# Patient Record
Sex: Male | Born: 1940
Health system: Southern US, Community
[De-identification: ages and names within clinical notes are randomized; demographics above are authoritative.]

## PROBLEM LIST (undated history)

## (undated) DIAGNOSIS — G4733 Obstructive sleep apnea (adult) (pediatric): Principal | ICD-10-CM

## (undated) DIAGNOSIS — G912 (Idiopathic) normal pressure hydrocephalus: Secondary | ICD-10-CM

## (undated) DIAGNOSIS — F32A Depression, unspecified: Secondary | ICD-10-CM

## (undated) DIAGNOSIS — I10 Essential (primary) hypertension: Secondary | ICD-10-CM

## (undated) HISTORY — PX: EYE SURGERY: SHX253

## (undated) HISTORY — DX: Obstructive sleep apnea (adult) (pediatric): G47.33

## (undated) HISTORY — PX: FRACTURE SURGERY: SHX138

---

## 2011-07-04 ENCOUNTER — Encounter (HOSPITAL_COMMUNITY): Payer: Self-pay | Admitting: *Deleted

## 2011-07-04 ENCOUNTER — Inpatient Hospital Stay (HOSPITAL_COMMUNITY)
Admission: AD | Admit: 2011-07-04 | Discharge: 2011-07-06 | DRG: 378 | Disposition: A | Discharge status: Home / Self Care | Payer: Medicare PPO | Source: Other Acute Inpatient Hospital | Attending: Internal Medicine | Admitting: Internal Medicine

## 2011-07-04 DIAGNOSIS — D62 Acute posthemorrhagic anemia: Secondary | ICD-10-CM | POA: Diagnosis present

## 2011-07-04 DIAGNOSIS — K922 Gastrointestinal hemorrhage, unspecified: Secondary | ICD-10-CM | POA: Diagnosis present

## 2011-07-04 DIAGNOSIS — K2980 Duodenitis without bleeding: Secondary | ICD-10-CM | POA: Diagnosis present

## 2011-07-04 DIAGNOSIS — E785 Hyperlipidemia, unspecified: Secondary | ICD-10-CM | POA: Diagnosis present

## 2011-07-04 DIAGNOSIS — Z7982 Long term (current) use of aspirin: Secondary | ICD-10-CM

## 2011-07-04 DIAGNOSIS — I10 Essential (primary) hypertension: Secondary | ICD-10-CM | POA: Diagnosis present

## 2011-07-04 DIAGNOSIS — K5520 Angiodysplasia of colon without hemorrhage: Secondary | ICD-10-CM | POA: Diagnosis present

## 2011-07-04 DIAGNOSIS — K222 Esophageal obstruction: Secondary | ICD-10-CM | POA: Diagnosis present

## 2011-07-04 DIAGNOSIS — Z791 Long term (current) use of non-steroidal anti-inflammatories (NSAID): Secondary | ICD-10-CM

## 2011-07-04 DIAGNOSIS — K2961 Other gastritis with bleeding: Principal | ICD-10-CM | POA: Diagnosis present

## 2011-07-04 DIAGNOSIS — E119 Type 2 diabetes mellitus without complications: Secondary | ICD-10-CM | POA: Diagnosis present

## 2011-07-04 DIAGNOSIS — K5730 Diverticulosis of large intestine without perforation or abscess without bleeding: Secondary | ICD-10-CM | POA: Diagnosis present

## 2011-07-04 HISTORY — DX: Essential (primary) hypertension: I10

## 2011-07-05 ENCOUNTER — Encounter (HOSPITAL_COMMUNITY): Payer: Self-pay | Admitting: *Deleted

## 2011-07-05 ENCOUNTER — Encounter (HOSPITAL_COMMUNITY)
Admission: AD | Disposition: A | Discharge status: Home / Self Care | Payer: Self-pay | Source: Other Acute Inpatient Hospital | Attending: Internal Medicine

## 2011-07-05 DIAGNOSIS — K922 Gastrointestinal hemorrhage, unspecified: Secondary | ICD-10-CM | POA: Diagnosis present

## 2011-07-05 DIAGNOSIS — E785 Hyperlipidemia, unspecified: Secondary | ICD-10-CM | POA: Diagnosis present

## 2011-07-05 DIAGNOSIS — I10 Essential (primary) hypertension: Secondary | ICD-10-CM | POA: Diagnosis present

## 2011-07-05 DIAGNOSIS — D62 Acute posthemorrhagic anemia: Secondary | ICD-10-CM | POA: Diagnosis present

## 2011-07-05 DIAGNOSIS — E119 Type 2 diabetes mellitus without complications: Secondary | ICD-10-CM | POA: Diagnosis present

## 2011-07-05 HISTORY — PX: ESOPHAGOGASTRODUODENOSCOPY: SHX5428

## 2011-07-05 LAB — COMPREHENSIVE METABOLIC PANEL
ALT: 17 U/L (ref 0–53)
Albumin: 3.3 g/dL — ABNORMAL LOW (ref 3.5–5.2)
Alkaline Phosphatase: 53 U/L (ref 39–117)
CO2: 30 mEq/L (ref 19–32)
Calcium: 8.9 mg/dL (ref 8.4–10.5)
Creatinine, Ser: 0.89 mg/dL (ref 0.50–1.35)
Total Bilirubin: 0.3 mg/dL (ref 0.3–1.2)

## 2011-07-05 LAB — TYPE AND SCREEN
ABO/RH(D): O POS
Antibody Screen: NEGATIVE

## 2011-07-05 LAB — GLUCOSE, CAPILLARY
Glucose-Capillary: 159 mg/dL — ABNORMAL HIGH (ref 70–99)
Glucose-Capillary: 141 mg/dL — ABNORMAL HIGH (ref 70–99)
Glucose-Capillary: 153 mg/dL — ABNORMAL HIGH (ref 70–99)

## 2011-07-05 LAB — CBC
HCT: 28.1 % — ABNORMAL LOW (ref 39.0–52.0)
Hemoglobin: 9.4 g/dL — ABNORMAL LOW (ref 13.0–17.0)
MCH: 32 pg (ref 26.0–34.0)
MCH: 31.7 pg (ref 26.0–34.0)
MCH: 32.1 pg (ref 26.0–34.0)
MCHC: 33.5 g/dL (ref 30.0–36.0)
MCHC: 33.6 g/dL (ref 30.0–36.0)
MCV: 95.4 fL (ref 78.0–100.0)
MCV: 95.5 fL (ref 78.0–100.0)
Platelets: 183 10*3/uL (ref 150–400)
Platelets: 207 10*3/uL (ref 150–400)
RBC: 2.81 MIL/uL — ABNORMAL LOW (ref 4.22–5.81)
RDW: 12.7 % (ref 11.5–15.5)

## 2011-07-05 SURGERY — EGD (ESOPHAGOGASTRODUODENOSCOPY)
Anesthesia: Moderate Sedation

## 2011-07-05 MED ORDER — PEG 3350-KCL-NA BICARB-NACL 420 G PO SOLR
4000.0000 mL | Freq: Once | ORAL | Status: AC
  Administered 2011-07-05: 4000 mL via ORAL
  Filled 2011-07-05: qty 4000

## 2011-07-05 MED ORDER — ONDANSETRON HCL 4 MG/2ML IJ SOLN: 4.0000 mg | Freq: Four times a day (QID) | INTRAMUSCULAR | Status: DC | PRN

## 2011-07-05 MED ORDER — ACETAMINOPHEN 325 MG PO TABS: 650.0000 mg | ORAL_TABLET | Freq: Four times a day (QID) | ORAL | Status: DC | PRN

## 2011-07-05 MED ORDER — FENTANYL CITRATE 0.05 MG/ML IJ SOLN
INTRAMUSCULAR | Status: DC | PRN
  Administered 2011-07-05 (×2): 25 ug via INTRAVENOUS

## 2011-07-05 MED ORDER — BISACODYL 5 MG PO TBEC
10.0000 mg | DELAYED_RELEASE_TABLET | Freq: Once | ORAL | Status: AC
  Administered 2011-07-05: 10 mg via ORAL
  Filled 2011-07-05: qty 2

## 2011-07-05 MED ORDER — FENTANYL CITRATE 0.05 MG/ML IJ SOLN
INTRAMUSCULAR | Status: AC
  Filled 2011-07-05: qty 2

## 2011-07-05 MED ORDER — MIDAZOLAM HCL 10 MG/2ML IJ SOLN
INTRAMUSCULAR | Status: DC | PRN
  Administered 2011-07-05 (×2): 2 mg via INTRAVENOUS

## 2011-07-05 MED ORDER — INSULIN ASPART 100 UNIT/ML ~~LOC~~ SOLN
0.0000 [IU] | SUBCUTANEOUS | Status: DC
  Administered 2011-07-05: 1 [IU] via SUBCUTANEOUS
  Administered 2011-07-06: 2 [IU] via SUBCUTANEOUS
  Filled 2011-07-05: qty 3

## 2011-07-05 MED ORDER — ENALAPRILAT 1.25 MG/ML IV SOLN
1.2500 mg | Freq: Four times a day (QID) | INTRAVENOUS | Status: DC
  Administered 2011-07-05 – 2011-07-06 (×3): 1.25 mg via INTRAVENOUS
  Filled 2011-07-05 (×10): qty 1

## 2011-07-05 MED ORDER — KCL IN DEXTROSE-NACL 20-5-0.9 MEQ/L-%-% IV SOLN
INTRAVENOUS | Status: DC
  Administered 2011-07-05: 01:00:00 via INTRAVENOUS
  Filled 2011-07-05 (×5): qty 1000

## 2011-07-05 MED ORDER — MIDAZOLAM HCL 10 MG/2ML IJ SOLN
INTRAMUSCULAR | Status: AC
  Filled 2011-07-05: qty 2

## 2011-07-05 MED ORDER — ONDANSETRON HCL 4 MG PO TABS: 4.0000 mg | ORAL_TABLET | Freq: Four times a day (QID) | ORAL | Status: DC | PRN

## 2011-07-05 MED ORDER — ACETAMINOPHEN 650 MG RE SUPP: 650.0000 mg | Freq: Four times a day (QID) | RECTAL | Status: DC | PRN

## 2011-07-05 MED ORDER — SODIUM CHLORIDE 0.9 % IV SOLN: Freq: Once | INTRAVENOUS | Status: DC

## 2011-07-05 MED ORDER — SODIUM CHLORIDE 0.9 % IV SOLN
8.0000 mg/h | INTRAVENOUS | Status: DC
  Administered 2011-07-05 (×2): 8 mg/h via INTRAVENOUS
  Filled 2011-07-05 (×6): qty 80

## 2011-07-05 MED ORDER — BUTAMBEN-TETRACAINE-BENZOCAINE 2-2-14 % EX AERO
INHALATION_SPRAY | CUTANEOUS | Status: DC | PRN
  Administered 2011-07-05: 2 via TOPICAL

## 2011-07-05 NOTE — H&P (Signed)
PCP:   No primary provider on file.   Chief Complaint:  Dizziness, black tarry stool for 3 days.  HPI: [name] is an extremely pleasant gentleman accepted in transfer form [place], where he presented with 3 day hx of passing tarry stool for the past 3 days, preceded by nausea and dizziness. No vomiting or hematemesis. He has had some mild abdominal discomfort. Patient admits to taking aspirin and ibuprofen on a daily basis, and eats lots of spicy food, which he thought protects against ulcer development. He had colonoscopy about 3 years ago(apparently normal per patient). No prior EGD. Patient last noticed tarry stool this morning and occasionally has some lightheadedness. He had some maroon colored stool about 3 months ago but ignored it, thinking the color change was related to what he had taken. Hemoglobin was 10 in [place] where he was started on protonix drip and ivf before transfer to [facility] for further management.  Review of Systems:  The patient denies anorexia, fever, weight loss,, vision loss, decreased hearing, hoarseness, chest pain, syncope, dyspnea on exertion, peripheral edema, balance deficits, hemoptysis, severe indigestion/heartburn, hematuria, incontinence, genital sores, muscle weakness, suspicious skin lesions, transient blindness, difficulty walking, depression, unusual weight change, abnormal bleeding, enlarged lymph nodes, angioedema, and breast masses.  Past Medical History: Past Medical History  Diagnosis Date  . Hypertension   . Diabetes mellitus    No past surgical history on file.  Medications: Prior to Admission medications   Medication Sig Start Date End Date Taking? Authorizing Provider  aspirin 325 MG tablet Take 325 mg by mouth daily.     Yes Historical Provider, MD  Enalapril-Hydrochlorothiazide 5-12.5 MG per tablet Take 1 tablet by mouth daily.     Yes Historical Provider, MD  metFORMIN (GLUCOPHAGE) 500 MG tablet Take 500 mg by mouth 2 (two) times  daily with a meal.     Yes Historical Provider, MD  pravastatin (PRAVACHOL) 40 MG tablet Take 40 mg by mouth daily.     Yes Historical Provider, MD    Allergies:  No Known Allergies  Social History:  reports that he quit smoking about 10 years ago. He quit smokeless tobacco use about 10 years ago. He reports that he drinks about 1.8 ounces of alcohol per week. He reports that he does not use illicit drugs.   Family History: No family history on file.  Physical Exam: Filed Vitals:   07/04/11 2207  BP: 151/73  Pulse: 90  Temp: 97.9 F (36.6 C)  TempSrc: Oral  Resp: 18  Height: 5\' 8"  (1.727 m)  Weight: 76.975 kg (169 lb 11.2 oz)  SpO2: 100%   General- not in distress. HEENT- no jvd. RS- lungs clear. CVS- S1S2. RRR Abdomen- soft, nontender. +BS CNS- grossly intact. Extremities- no pedal edema.   Labs on Admission:  No results found for this basename: NA:2,K:2,CL:2,CO2:2,GLUCOSE:2,BUN:2,CREATININE:2,CALCIUM:2,MG:2,PHOS:2 in the last 72 hours No results found for this basename: AST:2,ALT:2,ALKPHOS:2,BILITOT:2,PROT:2,ALBUMIN:2 in the last 72 hours No results found for this basename: LIPASE:2,AMYLASE:2 in the last 72 hours No results found for this basename: WBC:2,NEUTROABS:2,HGB:2,HCT:2,MCV:2,PLT:2 in the last 72 hours No results found for this basename: CKTOTAL:3,CKMB:3,CKMBINDEX:3,TROPONINI:3 in the last 72 hours No results found for this basename: TSH,T4TOTAL,FREET3,T3FREE,THYROIDAB in the last 72 hours No results found for this basename: VITAMINB12:2,FOLATE:2,FERRITIN:2,TIBC:2,IRON:2,RETICCTPCT:2 in the last 72 hours  Radiological Exams on Admission: No results found.  Assessment [name] presents with melena and acute blood loss anemia, likely due to NSAID induced bleeding peptic ulcer disease. He is hemodynamically stable  but has significant anemia which seems new.  Plan .GI bleeding- admit tele. PPI/IVF, monitor h/h, transfuse as necessary. Keep NPO. Avoid nsaids.  Discussed with Dr Dulce Sellar, who will graciously see patient in am. .Anemia associated with acute blood loss- monitor, transfuse prbc as necessary. .DM (diabetes mellitus), type 2- hold metformin. ssi .HTN (hypertension)- uncontrolled. Enalaprit while npo .Hyperlipidemia- resume home meds after egd. dvt prophylaxis. Condition guarded.    Randy [name] [phone] 07/05/2011, 1:01 AM

## 2011-07-05 NOTE — H&P (View-Only) (Signed)
DAILY PROGRESS NOTE                              GENERAL [facility]NAL [facility]IC[facility]  SUBJECTIVE: Had 1 dark bowel movement yesterday. Seems the blackish color is clearing and but did not resolve completely.  OBJECTIVE: BP 101/45  Pulse 81  Temp(Src) 96.7 F (35.9 C) (Oral)  Resp 20  Ht 5\' 8"  (1.727 m)  Wt 76.975 kg (169 lb 11.2 oz)  BMI 25.80 kg/m2  SpO2 97%  Intake/Output Summary (Last 24 hours) at 07/05/11 0942 Last data filed at 07/05/11 0700  Gross per 24 hour  Intake 646.67 ml  Output    950 ml  Net -303.33 ml                      Weight change:  Physical Exam: General: Alert and awake oriented x3 not in any acute distress. HEENT: anicteric sclera, pupils equal reactive to light and accommodation CVS: S1-S2 heard, no murmur rubs or gallops Chest: clear to auscultation bilaterally, no wheezing rales or rhonchi Abdomen:  normal bowel sounds, soft, nontender, nondistended, no organomegaly Neuro: Cranial nerves II-XII intact, no focal neurological deficits Extremities: no cyanosis, no clubbing or edema noted bilaterally   Lab Results:  [facility] 07/05/11 0056  NA 137  K 3.8  CL 101  CO2 30  GLUCOSE 230*  BUN 16  CREATININE 0.89  CALCIUM 8.9  MG --  PHOS --    Basename 07/05/11 0056  AST 12  ALT 17  ALKPHOS 53  BILITOT 0.3  PROT 5.9*  ALBUMIN 3.3*   Basename 07/05/11 0525 07/05/11 0056  WBC 6.1 8.3  NEUTROABS -- --  HGB 8.9* 9.4*  HCT 26.8* 28.1*  MCV 95.4 95.6  PLT 183 191    Studies/Results: No results found. Medications: Scheduled Meds:   . enalaprilat  1.25 mg Intravenous Q6H  . insulin aspart  0-9 Units Subcutaneous Q4H   Continuous Infusions:   . dextrose 5 % and 0.9 % NaCl with KCl 20 mEq/L 75 mL/hr at 07/05/11 0122  . pantoprozole (PROTONIX) infusion 8 mg/hr (07/05/11 0122)   PRN Meds:.acetaminophen, acetaminophen, ondansetron (ZOFRAN) IV, ondansetron  ASSESSMENT & PLAN: Active Problems:  GI bleeding  Anemia  associated with acute blood loss  DM (diabetes mellitus), type 2  HTN (hypertension)  Hyperlipidemia  1. GI bleed: Patient is on the PPI drip. Gastroenterology service was consulted. Patient is n.p.o. Reported use of NSAIDs increases possibility of gastritis/PUD. Await GI evaluation  2. Anemia, acute blood loss: Likely secondary to a GI bleed. Hemoglobin will be watched every 8 hours. We'll transfuse if hemoglobin dropped less than 7.0 or patient symptomatic.  3. Diabetes mellitus type 2: Patient is on sliding scale currently is n.p.o.  4. HTN: Off of the oral medication because of n.p.o. status on as needed IV enalaprilat.   LOS: 1 day   Nola Botkins A 07/05/2011, 9:42 AM

## 2011-07-05 NOTE — Plan of Care (Signed)
Problem: Phase II Progression Outcomes Goal: No active bleeding +

## 2011-07-05 NOTE — Interval H&P Note (Signed)
History and Physical Interval Note:  07/05/2011 1:46 PM  Randy [name]  has presented today for surgery, with the diagnosis of * No pre-op diagnosis entered *  The various methods of treatment have been discussed with the patient and family. After consideration of risks, benefits and other options for treatment, the patient has consented to  Procedure(s): ESOPHAGOGASTRODUODENOSCOPY (EGD) as a surgical intervention .  The patients' history has been reviewed, patient examined, no change in status, stable for surgery.  I have reviewed the patients' chart and labs.  Questions were answered to the patient's satisfaction.     Freddy Jaksch  Risks (bleeding, infection, bowel perforation that could require surgery, sedation-related changes in cardiopulmonary systems), benefits (identification and possible treatment of source of symptoms, exclusion of certain causes of symptoms), and alternatives (watchful waiting, radiographic imaging studies, empiric medical treatment) of upper endoscopy (EGD) were explained to patient/family in detail and patient wishes to proceed.

## 2011-07-05 NOTE — Consult Note (Signed)
[facility] Consultation Note  Referring Provider: Conley Canal, MD [facility])  Reason for Consultation:  Melena, anemia  HPI: Randy [name] is a 71 y.o. male admitted for melena.  In static state of health until few days ago, when he began to experience progressive weakness in setting of several melenic stools daily.  No abdominal pain.  ASA 81/day and occasional NSAIDs, but no increase lately.  No nausea, vomiting, dysphagia, odynophagia, change in bowel habits.  No hematemesis.  Never had endoscopy.  Screening colonoscopy ~ 3 years ago normal per patient.   Brother with history colon cancer.   Past Medical History  Diagnosis Date  . Hypertension   . Diabetes mellitus     No past surgical history on file.  Prior to Admission medications   Medication Sig Start Date End Date Taking? Authorizing Provider  aspirin 325 MG tablet Take 325 mg by mouth daily.     Yes Historical Provider, MD  Enalapril-Hydrochlorothiazide 5-12.5 MG per tablet Take 1 tablet by mouth daily.     Yes Historical Provider, MD  metFORMIN (GLUCOPHAGE) 500 MG tablet Take 500 mg by mouth 2 (two) times daily with a meal.     Yes Historical Provider, MD  pravastatin (PRAVACHOL) 40 MG tablet Take 40 mg by mouth daily.     Yes Historical Provider, MD    Current Facility-Administered Medications  Medication Dose Route Frequency Provider Last Rate Last Dose  . acetaminophen (TYLENOL) tablet 650 mg  650 mg Oral Q6H PRN Simbiso Ranga       Or  . acetaminophen (TYLENOL) suppository 650 mg  650 mg Rectal Q6H PRN Simbiso Ranga      . dextrose 5 % and 0.9 % NaCl with KCl 20 mEq/L infusion   Intravenous Continuous Simbiso Ranga 75 mL/hr at 07/05/11 0122    . enalaprilat (VASOTEC) injection 1.25 mg  1.25 mg Intravenous Q6H Simbiso Ranga   1.25 mg at 07/05/11 0621  . insulin aspart (novoLOG) injection 0-9 Units  0-9 Units Subcutaneous Q4H Simbiso Ranga      . ondansetron (ZOFRAN) tablet 4 mg  4 mg Oral Q6H PRN Simbiso Ranga        Or  . ondansetron (ZOFRAN) injection 4 mg  4 mg Intravenous Q6H PRN Simbiso Ranga      . pantoprazole (PROTONIX) 80 mg in sodium chloride 0.9 % 250 mL infusion  8 mg/hr Intravenous Continuous Simbiso Ranga 25 mL/hr at 07/05/11 0122 8 mg/hr at 07/05/11 0122    Allergies as of 07/04/2011  . (No Known Allergies)    No family history on file.  History   Social History  . Marital Status: Married    Spouse Name: N/A    Number of Children: N/A  . Years of Education: N/A   Occupational History  . Not on file.   Social History Main Topics  . Smoking status: Former Smoker    Quit date: 07/03/2001  . Smokeless tobacco: Former Neurosurgeon    Quit date: 07/03/2001  . Alcohol Use: 1.8 oz/week    3 Glasses of wine per week  . Drug Use: No  . Sexually Active: Yes   Other Topics Concern  . Not on file   Social History Narrative  . No narrative on file    Review of Systems: Positive for: anorexia, fatigue, weakness, malaise, dizziness Gen: Denies any fever, chills, rigors, night sweats, , involuntary weight loss, and sleep disorder CV: Denies chest pain, angina, palpitations, syncope, orthopnea, PND, peripheral edema,  and claudication. Resp: Denies dyspnea, cough, sputum, wheezing, coughing up blood. GI: Described in detail in HPI.    GU : Denies urinary burning, blood in urine, urinary frequency, urinary hesitancy, nocturnal urination, and urinary incontinence. MS: Denies joint pain or swelling.  Denies muscle weakness, cramps, atrophy.  Derm: Denies rash, itching, oral ulcerations, hives, unhealing ulcers.  Psych: Denies depression, anxiety, memory loss, suicidal ideation, hallucinations,  and confusion. Heme: Denies bruising, bleeding, and enlarged lymph nodes. Neuro:  Denies any headaches, , paresthesias. Endo:  Denies any problems with DM, thyroid, adrenal function.  Physical Exam: Vital signs in last 24 hours: Temp:  [96.7 F (35.9 C)-97.9 F (36.6 C)] 96.7 F (35.9 C)  (01/01 0636) Pulse Rate:  [81-90] 81  (01/01 0636) Resp:  [18-20] 20  (01/01 0636) BP: (101-151)/(45-73) 101/45 mmHg (01/01 0636) SpO2:  [97 %-100 %] 97 % (01/01 0636) Weight:  [76.975 kg (169 lb 11.2 oz)] 169 lb 11.2 oz (76.975 kg) (12/31 2207) Last BM Date: 07/04/11 General:   Alert,  Well-developed, well-nourished, pleasant and cooperative in NAD Head:  Normocephalic and atraumatic. Eyes:  Sclera clear, no icterus.   Conjunctiva pink. Ears:  Normal auditory acuity. Nose:  No deformity, discharge,  or lesions. Mouth:  No deformity or lesions.  Oropharynx pink & moist. Neck:  Supple; no masses or thyromegaly. Lungs:  Clear throughout to auscultation.   No wheezes, crackles, or rhonchi. No acute distress. Heart:  Regular rate and rhythm; no murmurs, clicks, rubs,  or gallops. Abdomen:  Soft, nontender and nondistended. No masses, hepatosplenomegaly or hernias noted. Slightly hyperactive bowel sounds, without guarding, and without rebound.     Msk:  Symmetrical without gross deformities. Normal posture. Pulses:  Normal pulses noted. Extremities:  Without clubbing or edema. Neurologic:  Alert and  oriented x4;  grossly normal neurologically. Skin:  Intact without significant lesions or rashes. Psych:  Alert and cooperative. Normal mood and affect.   Lab Results:  [facility] 07/05/11 0525 07/05/11 0056  WBC 6.1 8.3  HGB 8.9* 9.4*  HCT 26.8* 28.1*  PLT 183 191   BMET  Basename 07/05/11 0056  NA 137  K 3.8  CL 101  CO2 30  GLUCOSE 230*  BUN 16  CREATININE 0.89  CALCIUM 8.9   LFT  Basename 07/05/11 0056  PROT 5.9*  ALBUMIN 3.3*  AST 12  ALT 17  ALKPHOS 53  BILITOT 0.3  BILIDIR --  IBILI --   PT/INR  Basename 07/05/11 0056  LABPROT 13.3  INR 0.99    Studies/Results: No results found.  Impression:  1.  Melena. 2.  Acute blood loss anemia.  Overall history most typical of peptic ulcer disease.  Multiple other differential considerations can't be entirely  excluded.  Plan:  1.  Continue PPI. 2.  NPO. 3.  EGD today. 4.  Risks (bleeding, infection, bowel perforation that could require surgery, sedation-related changes in cardiopulmonary systems), benefits (identification and possible treatment of source of symptoms, exclusion of certain causes of symptoms), and alternatives (watchful waiting, radiographic imaging studies, empiric medical treatment) of upper endoscopy (EGD) were explained to patient/family in detail and patient wishes to proceed.   LOS: 1 day   Elita Dame M  07/05/2011, 10:23 AM

## 2011-07-05 NOTE — Progress Notes (Signed)
DAILY PROGRESS NOTE                              GENERAL INTERNAL MEDICINE [facility]  SUBJECTIVE: Had 1 dark bowel movement yesterday. Seems the blackish color is clearing and but did not resolve completely.  OBJECTIVE: BP 101/45  Pulse 81  Temp(Src) 96.7 F (35.9 C) (Oral)  Resp 20  Ht 5' 8" (1.727 m)  Wt 76.975 kg (169 lb 11.2 oz)  BMI 25.80 kg/m2  SpO2 97%  Intake/Output Summary (Last 24 hours) at 07/05/11 0942 Last data filed at 07/05/11 0700  Gross per 24 hour  Intake 646.67 ml  Output    950 ml  Net -303.33 ml                      Weight change:  Physical Exam: General: Alert and awake oriented x3 not in any acute distress. HEENT: anicteric sclera, pupils equal reactive to light and accommodation CVS: S1-S2 heard, no murmur rubs or gallops Chest: clear to auscultation bilaterally, no wheezing rales or rhonchi Abdomen:  normal bowel sounds, soft, nontender, nondistended, no organomegaly Neuro: Cranial nerves II-XII intact, no focal neurological deficits Extremities: no cyanosis, no clubbing or edema noted bilaterally   Lab Results:  Basename 07/05/11 0056  NA 137  K 3.8  CL 101  CO2 30  GLUCOSE 230*  BUN 16  CREATININE 0.89  CALCIUM 8.9  MG --  PHOS --    Basename 07/05/11 0056  AST 12  ALT 17  ALKPHOS 53  BILITOT 0.3  PROT 5.9*  ALBUMIN 3.3*   Basename 07/05/11 0525 07/05/11 0056  WBC 6.1 8.3  NEUTROABS -- --  HGB 8.9* 9.4*  HCT 26.8* 28.1*  MCV 95.4 95.6  PLT 183 191    Studies/Results: No results found. Medications: Scheduled Meds:   . enalaprilat  1.25 mg Intravenous Q6H  . insulin aspart  0-9 Units Subcutaneous Q4H   Continuous Infusions:   . dextrose 5 % and 0.9 % NaCl with KCl 20 mEq/L 75 mL/hr at 07/05/11 0122  . pantoprozole (PROTONIX) infusion 8 mg/hr (07/05/11 0122)   PRN Meds:.acetaminophen, acetaminophen, ondansetron (ZOFRAN) IV, ondansetron  ASSESSMENT & PLAN: Active Problems:  GI bleeding  Anemia  associated with acute blood loss  DM (diabetes mellitus), type 2  HTN (hypertension)  Hyperlipidemia  1. GI bleed: Patient is on the PPI drip. Gastroenterology service was consulted. Patient is n.p.o. Reported use of NSAIDs increases possibility of gastritis/PUD. Await GI evaluation  2. Anemia, acute blood loss: Likely secondary to a GI bleed. Hemoglobin will be watched every 8 hours. We'll transfuse if hemoglobin dropped less than 7.0 or patient symptomatic.  3. Diabetes mellitus type 2: Patient is on sliding scale currently is n.p.o.  4. HTN: Off of the oral medication because of n.p.o. status on as needed IV enalaprilat.   LOS: 1 day   Raven Furnas A 07/05/2011, 9:42 AM     

## 2011-07-05 NOTE — Op Note (Signed)
[name] [address] [place], [place]  [address]  ENDOSCOPY PROCEDURE REPORT  PATIENT:  Randy [name], Randy [name]  MR#:  [id number] BIRTHDATE:  12-24-1940, 70 yrs. old  GENDER:  male  ENDOSCOPIST:  Willis Modena, MD Referred by:   Clydia Llano, MD [facility])  PROCEDURE DATE:  07/05/2011 PROCEDURE:  EGD, diagnostic 65784 ASA CLASS:  Class II INDICATIONS:  melena, acute blood loss anemia MEDICATIONS:  Cetacaine spray x 2, Fentanyl 50 mcg IV, Versed 4 mg IV DESCRIPTION OF PROCEDURE:   After the risks benefits and alternatives of the procedure were thoroughly explained, informed consent was obtained.  The Pentax Gastroscope [id number] endoscope was introduced through the mouth and advanced to the second portion of the duodenum, without limitations.  The instrument was slowly withdrawn as the mucosa was fully examined.  <<PROCEDUREIMAGES>>  FINDINGS:  Small hiatal hernia.  Schatzki's ring versus peptic stricture, friable with some esophagitis, at GE junction. Otherwise normal esophagus.  Few pre-pyloric clean-based antral erosions, otherwise normal stomach.  Normal pylorus without stricture, ulcer, or outlet obstruction  Mild bulbar duodenitis with erosions.  At apex of duodenal bulb, there was some tissue prominence, most consistent with Brunner's hyperplasia; appearance not typical of mass lesion.  Duodenum otherwise normal to the third portion.  No old or fresh blood seen to the extent of our examination.  ENDOSCOPIC IMPRESSION:    1.  Peptic stricture with LA-B esophagitis. 2.  Gastric clean-based erosions. 3.  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 4.  No old/fresh blood seen.  While findings above could explain some melena, these very             well might be incidental findings (NSAID-related gastroduodenopathy).  RECOMMENDATIONS:      1.  Watch for potential complications of procedure. 2.  Continued PPI therapy. 3.   Colonoscopy tomorrow for further evaluation.  REPEAT EXAM: Colonoscopy tomorrow.  ______________________________ Willis Modena  CC:  n. eSIGNEDWillis Modena at 07/05/2011 02:16 PM  [place], [place], [phone]

## 2011-07-05 NOTE — Brief Op Note (Signed)
Please see [facility] note dated 07/05/11. 

## 2011-07-06 ENCOUNTER — Encounter (HOSPITAL_COMMUNITY)
Admission: AD | Disposition: A | Discharge status: Home / Self Care | Payer: Self-pay | Source: Other Acute Inpatient Hospital | Attending: Internal Medicine

## 2011-07-06 ENCOUNTER — Encounter (HOSPITAL_COMMUNITY): Payer: Self-pay | Admitting: *Deleted

## 2011-07-06 DIAGNOSIS — K922 Gastrointestinal hemorrhage, unspecified: Secondary | ICD-10-CM | POA: Diagnosis present

## 2011-07-06 HISTORY — PX: COLONOSCOPY: SHX5424

## 2011-07-06 LAB — CBC
Hemoglobin: 9.7 g/dL — ABNORMAL LOW (ref 13.0–17.0)
MCH: 31.8 pg (ref 26.0–34.0)
MCHC: 33.1 g/dL (ref 30.0–36.0)
MCHC: 33.5 g/dL (ref 30.0–36.0)
MCV: 96.1 fL (ref 78.0–100.0)
Platelets: 199 10*3/uL (ref 150–400)
Platelets: 258 10*3/uL (ref 150–400)
RBC: 3.05 MIL/uL — ABNORMAL LOW (ref 4.22–5.81)
RDW: 12.8 % (ref 11.5–15.5)
WBC: 6 10*3/uL (ref 4.0–10.5)

## 2011-07-06 LAB — GLUCOSE, CAPILLARY
Glucose-Capillary: 152 mg/dL — ABNORMAL HIGH (ref 70–99)
Glucose-Capillary: 164 mg/dL — ABNORMAL HIGH (ref 70–99)

## 2011-07-06 LAB — BASIC METABOLIC PANEL
CO2: 28 mEq/L (ref 19–32)
Chloride: 106 mEq/L (ref 96–112)
Creatinine, Ser: 0.82 mg/dL (ref 0.50–1.35)
GFR calc Af Amer: >90 mL/min (ref >90)
Sodium: 139 mEq/L (ref 135–145)

## 2011-07-06 LAB — ABO/RH: ABO/RH(D): O POS

## 2011-07-06 SURGERY — COLONOSCOPY
Anesthesia: Moderate Sedation

## 2011-07-06 SURGERY — EGD (ESOPHAGOGASTRODUODENOSCOPY)
Anesthesia: Moderate Sedation

## 2011-07-06 MED ORDER — FENTANYL CITRATE 0.05 MG/ML IJ SOLN
INTRAMUSCULAR | Status: AC
  Filled 2011-07-06: qty 4

## 2011-07-06 MED ORDER — SODIUM CHLORIDE 0.9 % IV SOLN: Freq: Once | INTRAVENOUS | Status: DC

## 2011-07-06 MED ORDER — ESOMEPRAZOLE MAGNESIUM 40 MG PO CPDR: 40.0000 mg | DELAYED_RELEASE_CAPSULE | Freq: Every day | ORAL | Status: DC

## 2011-07-06 MED ORDER — POLYETHYLENE GLYCOL 3350 17 G PO PACK: 17.0000 g | PACK | Freq: Every day | ORAL | Status: AC

## 2011-07-06 MED ORDER — MIDAZOLAM HCL 10 MG/2ML IJ SOLN
INTRAMUSCULAR | Status: AC
Start: 2011-07-06 — End: 2011-07-06
  Filled 2011-07-06: qty 4

## 2011-07-06 MED ORDER — MIDAZOLAM HCL 5 MG/5ML IJ SOLN
INTRAMUSCULAR | Status: DC | PRN
  Administered 2011-07-06 (×2): 2 mg via INTRAVENOUS

## 2011-07-06 MED ORDER — FENTANYL NICU IV SYRINGE 50 MCG/ML
INJECTION | INTRAMUSCULAR | Status: DC | PRN
  Administered 2011-07-06 (×2): 25 ug via INTRAVENOUS

## 2011-07-06 NOTE — H&P (Signed)
H&P reviewed

## 2011-07-06 NOTE — Progress Notes (Signed)
IV d/c'd. Tele d/c'd. Pt d/c'd to home. Home meds and d/c instructions discussed with pt. Pt denies any questions or concerns at this time. Pt leaving unit via wheelchair and appears in no acute distress. Jonalyn Sedlak RN 

## 2011-07-06 NOTE — Discharge Summary (Signed)
Patient ID: Randy [name] MRN: [id number] DOB/AGE: 1940-10-28 71 y.o.  Admit date: 07/04/2011 Discharge date: 07/06/2011  Primary Care Physician:  Randy Robes, MD  Discharge Diagnoses:    Present on Admission:  Marland KitchenMarland KitchenUpper GI bleed secondary to gastric erosions .Anemia associated with acute blood loss .DM (diabetes mellitus), type 2 .HTN (hypertension) .Hyperlipidemia    Current Discharge Medication List    START taking these medications   Details  esomeprazole (NEXIUM) 40 MG capsule Take 1 capsule (40 mg total) by mouth daily before breakfast. Qty: 30 capsule, Refills: 2      CONTINUE these medications which have NOT CHANGED   Details  Enalapril-Hydrochlorothiazide 5-12.5 MG per tablet Take 1 tablet by mouth daily.      metFORMIN (GLUCOPHAGE) 500 MG tablet Take 500 mg by mouth 2 (two) times daily with a meal.      pravastatin (PRAVACHOL) 40 MG tablet Take 40 mg by mouth daily.        STOP taking these medications     aspirin 325 MG tablet         Disposition and Follow-up:  Patient has  follow up appt scheduled wit his PCP tomorrow ( 01/03( at 11:15 am) Patient will follow up with Dr Randy [name] [facility]  GI) in 4 weeks  Consults:   [name] ( Randy [name]/ Randy [name])  Significant Diagnostic Studies:  EGD ( 07/05/11) FINDINGS: Small hiatal hernia. Schatzki's ring versus peptic  stricture, friable with some esophagitis, at GE junction.  Otherwise normal esophagus. Few pre-pyloric clean-based antral  erosions, otherwise normal stomach. Normal pylorus without  stricture, ulcer, or outlet obstruction Mild bulbar duodenitis  with erosions. At apex of duodenal bulb, there was some tissue  prominence, most consistent with Brunner's hyperplasia; appearance  not typical of mass lesion. Duodenum otherwise normal to the  third portion. No old or fresh blood seen to the extent of our  examination.  ENDOSCOPIC IMPRESSION: 1. Peptic stricture with LA-B  esophagitis.  2.  Gastric clean-based erosions.  3. 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  4. No old/fresh blood seen. While findings above could  explain some melena, these very well might be  incidental findings (NSAID-related gastroduodenopathy).   Colonoscopy ( 07/06/11) FINDINGS: Arteriovenous malformations were seen in the in the  cecum. very small could have been scope trauma Diverticula were  found. multiple small, medium, and large diverticula no active  bleeding Retroflexed views in the rectum revealed no  abnormalities.  COMPLICATIONS: A complication of none occured on 07/06/2011 at.  ENDOSCOPIC IMPRESSION:  1) AVM's in the cecum  2) Diverticula    Brief H and P: For complete details please refer to admission H and P, but in brief Randy [name] is an extremely pleasant gentleman accepted in transfer form [place], where he presented with 3 day hx of passing tarry stool for the past 3 days, preceded by nausea and dizziness. No vomiting or hematemesis. He has had some mild abdominal discomfort. Patient admits to taking aspirin and ibuprofen on a daily basis, and eats lots of spicy food, which he thought protects against ulcer development. He had colonoscopy about 3 years ago(apparently normal per patient). No prior EGD. Patient last noticed tarry stool this morning and occasionally has some lightheadedness. He had some maroon colored stool about 3 months ago but ignored it, thinking the color change was related to what he had taken. Hemoglobin was 10 in [place] where he was started on protonix drip and ivf before transfer to  [facility] for further management.   Physical Exam on Discharge:  Filed Vitals:   07/06/11 0920 07/06/11 0925 07/06/11 0930 07/06/11 1300  BP: 152/76 152/76 158/91 138/60  Pulse:      Temp:    98 F (36.7 C)  TempSrc:      Resp: 30 45 83 78  Height:      Weight:      SpO2: 98% 99% 100% 100%     Intake/Output Summary (Last 24 hours) at  07/06/11 1507 Last data filed at 07/06/11 1300  Gross per 24 hour  Intake 6058.75 ml  Output    501 ml  Net 5557.75 ml    General: elderly male in  no acute distress. HEENT: No bruits, no goiter. Heart: Regular rate and rhythm, without murmurs, rubs, gallops. Lungs: Clear to auscultation bilaterally. Abdomen: Soft, nontender, nondistended, positive bowel sounds. Extremities: No clubbing cyanosis or edema with positive pedal pulses. Neuro: Alert, awake, oriented x3,Grossly intact, nonfocal.  CBC:    Component Value Date/Time   WBC 10.7* 07/06/2011 1343   HGB 9.7* 07/06/2011 1343   HCT 29.3* 07/06/2011 1343   PLT 258 07/06/2011 1343   MCV 96.1 07/06/2011 1343    Basic Metabolic Panel:    Component Value Date/Time   NA 139 07/06/2011 1019   K 4.1 07/06/2011 1019   CL 106 07/06/2011 1019   CO2 28 07/06/2011 1019   BUN 6 07/06/2011 1019   CREATININE 0.82 07/06/2011 1019   GLUCOSE 158* 07/06/2011 1019   CALCIUM 8.7 07/06/2011 1019    Hospital Course:    *Upper GI bleed Patient admitted to medial floor on tele and continued on protonix drip that was started at [facility]  patient taken off ASA and NSAIDs that he was regularly taking at home . Serial H&h monitored and was noted to be stable. aptient seen by [name] and had EGD on 01/01 which showed  Peptic stricture with LA-B esophagitis,  Gastric clean-based erosions.  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  No old/fresh blood seen. Patient had colonoscopy done today which showed no active bleeding and findings of multiple  diverticulosis.  He has been clincially stable and asymptmatic. toleratred diet today. He is advised to discontinue ASA ( was taking 325 mg daily) for now and also avoid any NSAIDs and rather take extra strength  tylenol instead for his arthritis pains. His repeat hb today is  9.7 and stable. He will be discharged on po nexium 40 mg daily.he has scheuled for f/up appt with his PCP tomorrow.  Patient is planning on going on a cruise early next week and i have advised him to rechek his h&H and have his PCP notified at least once before leaving to make sure he is stable and follow up with GI in 4 weeks. He was found to have diverticulosis on colonoscopy and counseled on taking increased fluids, high fibre diet and also given a prescription for miralax.  His remaining medical issues have been stable.   Time spent on Discharge: 45 minutes  Signed: Eddie North 07/06/2011, 3:07 PM

## 2011-07-06 NOTE — Brief Op Note (Signed)
07/04/2011 - 07/06/2011  8:25 AM  PATIENT:  Randy [name]  71 y.o. male  PRE-OPERATIVE DIAGNOSIS:  GI Bleed  POST-OPERATIVE DIAGNOSIS:  Diverticulosis no bleeding  PROCEDURE:  Procedure(s): COLONOSCOPY  SURGEON:  Surgeon(s): Vertell Novak., MD

## 2011-07-06 NOTE — Op Note (Signed)
[name] [address] [place], [place]  [address]  COLONOSCOPY PROCEDURE REPORT  PATIENT:  Randy [name], Randy [name]  MR#:  [id number] BIRTHDATE:  02/28/1941, 70 yrs. old  GENDER:  male ENDOSCOPIST:  Carman Ching REF. BY:  [facility], PROCEDURE DATE:  07/06/2011 PROCEDURE:  Colonoscopy 19147 ASA CLASS:  Class II INDICATIONS:  Gastrointestinal hemorrhage EGD prior to this procedure yesterday revealed mild gastritis. Patient has had history of colonoscopies in the past done in [place] and in [place] over the past 10 years MEDICATIONS:   Fentanyl 50 mcg IV, Versed 4 mg IV  DESCRIPTION OF PROCEDURE:   After the risks benefits and alternatives of the procedure were thoroughly explained, informed consent was obtained.  Digital rectal exam was performed and revealed no abnormalities.   The Pentax Ped Colon [id number] and [id number]) endoscope was introduced through the anus and advanced to the cecum, limited by none.    The quality of the prep was good..  The instrument was then slowly withdrawn as the colon was fully examined. <<PROCEDUREIMAGES>>  FINDINGS:  Arteriovenous malformations were seen in the in the cecum. very small could have been scope trauma  Diverticula were found. multiple small, medium, and large diverticula no active bleeding   Retroflexed views in the rectum revealed no abnormalities. COMPLICATIONS:  A complication of none occured on 07/06/2011 at. ENDOSCOPIC IMPRESSION: 1) AVM's in the cecum 2) Diverticula probably scope trauma was very small and no active bleedingsuspect patient bled from diverticulosis RECOMMENDATIONS: we'll go ahead advance diet and discharge on MiraLAX in the future REPEAT EXAM:  No  ______________________________ Carman Ching  CC:  n. eSIGNEDCarman Ching at 07/06/2011 09:16 AM  Georgia Lopes, [phone]

## 2011-07-07 ENCOUNTER — Encounter (HOSPITAL_COMMUNITY): Payer: Self-pay | Admitting: Gastroenterology

## 2011-07-08 NOTE — Progress Notes (Signed)
Utilization review completed.  

## 2011-07-22 ENCOUNTER — Encounter (HOSPITAL_COMMUNITY): Payer: Self-pay | Admitting: Gastroenterology

## 2012-07-13 ENCOUNTER — Ambulatory Visit (INDEPENDENT_AMBULATORY_CARE_PROVIDER_SITE_OTHER): Payer: Medicare PPO | Admitting: Internal Medicine

## 2012-07-13 ENCOUNTER — Encounter: Payer: Self-pay | Admitting: Internal Medicine

## 2012-07-13 VITALS — BP 142/80 | HR 84 | Temp 98.5°F | Resp 18 | Ht 68.0 in | Wt 176.0 lb

## 2012-07-13 DIAGNOSIS — E785 Hyperlipidemia, unspecified: Secondary | ICD-10-CM

## 2012-07-13 DIAGNOSIS — E119 Type 2 diabetes mellitus without complications: Secondary | ICD-10-CM

## 2012-07-13 DIAGNOSIS — I10 Essential (primary) hypertension: Secondary | ICD-10-CM

## 2012-07-13 DIAGNOSIS — Z0000 Encounter for general adult medical examination without abnormal findings: Secondary | ICD-10-CM

## 2012-07-13 MED ORDER — METFORMIN HCL 1000 MG PO TABS: 1000.0000 mg | ORAL_TABLET | Freq: Two times a day (BID) | ORAL | Status: DC

## 2012-07-13 MED ORDER — METFORMIN HCL 500 MG PO TABS: 500.0000 mg | ORAL_TABLET | Freq: Two times a day (BID) | ORAL | Status: DC

## 2012-07-13 MED ORDER — PRAVASTATIN SODIUM 40 MG PO TABS: 40.0000 mg | ORAL_TABLET | Freq: Every day | ORAL | Status: DC

## 2012-07-13 MED ORDER — ENALAPRIL-HYDROCHLOROTHIAZIDE 10-25 MG PO TABS: 1.0000 | ORAL_TABLET | Freq: Every day | ORAL | Status: DC

## 2012-07-13 NOTE — Progress Notes (Signed)
Subjective:    Patient ID: Randy [name], male    DOB: 04-05-1941, 72 y.o.   MRN: [id number]  HPI  72 year old patient who is seen today to establish with our practice. Chronic medical problems include type 2 diabetes hypertension and dyslipidemia. He was hospitalized about one year ago for upper GI bleeding. He had upper and lower endoscopy at that time revealed some gastric erosions as well as AVMs and mild diverticular disease. No other hospital admissions  Past medical history otherwise unremarkable Social history he is retired from: [employer] 3 daughters and 5 grandchildren former smoker of 30 years duration but discontinued in Dec 02, 1983.  Family history father died at age 47 from cerebral vascular disease had diabetes and hypertension mother died age 5 of a brain tumor one brother in reasonably good health with ongoing tobacco use   1. Risk factors, based on past  M,S,F history-  cardiovascular risk factors include a history of diabetes hypertension and dyslipidemia  2.  Physical activities: Fairly active plays golf on a regular basis no exercise limitations  3.  Depression/mood: No history depression or mood disorder  4.  Hearing: No deficits  5.  ADL's: Independent in all aspects of daily living  6.  Fall risk: Low  7.  Home safety: No problems identified  8.  Height weight, and visual acuity; height and weight stable no change in visual acuity 9.  Counseling: Continue active lifestyle maintain ideal body weight home blood pressure and blood sugar monitoring  10. Lab orders based on risk factors: Hemoglobin A1c checked  11. Referral : Not appropriate at this time  12. Care plan: Continue heart healthy diet regular exercise regimen  13. Cognitive assessment: Alert and oriented normal affect. No cognitive dysfunction    Past Medical History  Diagnosis Date  . Hypertension   . Diabetes mellitus     History   Social History  . Marital Status: Married    Spouse Name: N/A     Number of Children: N/A  . Years of Education: N/A   Occupational History  . Not on file.   Social History Main Topics  . Smoking status: Former Smoker    Quit date: 07/03/2001  . Smokeless tobacco: Former Neurosurgeon    Quit date: 07/04/1991  . Alcohol Use: 1.8 oz/week    3 Glasses of wine per week     Comment: daily alcohol wine with dinner  . Drug Use: No  . Sexually Active: Yes   Other Topics Concern  . Not on file   Social History Narrative  . No narrative on file    Past Surgical History  Procedure Date  . Esophagogastroduodenoscopy 07/05/2011    Procedure: ESOPHAGOGASTRODUODENOSCOPY (EGD);  Surgeon: Freddy Jaksch, MD;  Location: [facility] END[facility]COPY;  Service: Endoscopy;  Laterality: N/A;  . Colonoscopy 07/06/2011    Procedure: COLONOSCOPY;  Surgeon: Vertell Novak., MD;  Location: [facility] ENDOSCOPY;  Service: Endoscopy;  Laterality: N/A;    No family history on file.  No Known Allergies  Current Outpatient Prescriptions on File Prior to Visit  Medication Sig Dispense Refill  . Enalapril-Hydrochlorothiazide 5-12.5 MG per tablet Take 1 tablet by mouth daily.        . metFORMIN (GLUCOPHAGE) 500 MG tablet Take 500 mg by mouth 2 (two) times daily with a meal.        . pravastatin (PRAVACHOL) 40 MG tablet Take 40 mg by mouth daily.          BP  142/80  Pulse 84  Temp 98.5 F (36.9 C) (Oral)  Resp 18  Ht 5\' 8"  (1.727 m)  Wt 176 lb (79.833 kg)  BMI 26.76 kg/m2  SpO2 97%       Review of Systems  Constitutional: Negative for fever, chills, activity change, appetite change and fatigue.  HENT: Negative for hearing loss, ear pain, congestion, rhinorrhea, sneezing, mouth sores, trouble swallowing, neck pain, neck stiffness, dental problem, voice change, sinus pressure and tinnitus.   Eyes: Negative for photophobia, pain, redness and visual disturbance.  Respiratory: Negative for apnea, cough, choking, chest tightness, shortness of breath and wheezing.   Cardiovascular:  Negative for chest pain, palpitations and leg swelling.  Gastrointestinal: Negative for nausea, vomiting, abdominal pain, diarrhea, constipation, blood in stool, abdominal distention, anal bleeding and rectal pain.  Genitourinary: Negative for dysuria, urgency, frequency, hematuria, flank pain, decreased urine volume, discharge, penile swelling, scrotal swelling, difficulty urinating, genital sores and testicular pain.  Musculoskeletal: Negative for myalgias, back pain, joint swelling, arthralgias and gait problem.  Skin: Negative for color change, rash and wound.  Neurological: Negative for dizziness, tremors, seizures, syncope, facial asymmetry, speech difficulty, weakness, light-headedness, numbness and headaches.  Hematological: Negative for adenopathy. Does not bruise/bleed easily.  Psychiatric/Behavioral: Negative for suicidal ideas, hallucinations, behavioral problems, confusion, sleep disturbance, self-injury, dysphoric mood, decreased concentration and agitation. The patient is not nervous/anxious.        Objective:   Physical Exam  Constitutional: He appears well-developed and well-nourished.       Blood pressure 140/80  HENT:  Head: Normocephalic and atraumatic.  Right Ear: External ear normal.  Left Ear: External ear normal.  Nose: Nose normal.  Mouth/Throat: Oropharynx is clear and moist.  Eyes: Conjunctivae normal and EOM are normal. Pupils are equal, round, and reactive to light. No scleral icterus.  Neck: Normal range of motion. Neck supple. No JVD present. No thyromegaly present.  Cardiovascular: Regular rhythm, normal heart sounds and intact distal pulses.  Exam reveals no gallop and no friction rub.   No murmur heard. Pulmonary/Chest: Effort normal and breath sounds normal. He exhibits no tenderness.  Abdominal: Soft. Bowel sounds are normal. He exhibits no distension and no mass. There is no tenderness.  Genitourinary: Prostate normal and penis normal. Guaiac negative  stool.  Musculoskeletal: Normal range of motion. He exhibits no edema and no tenderness.  Lymphadenopathy:    He has no cervical adenopathy.  Neurological: He is alert. He has normal reflexes. No cranial nerve deficit. Coordination normal.  Skin: Skin is warm and dry. No rash noted.  Psychiatric: He has a normal mood and affect. His behavior is normal.          Assessment & Plan:   Preventive health exam Hypertension. We'll up titrate blood pressure medication Dyslipidemia Diabetes mellitus. Will check a hemoglobin A1c  Recheck 3 months

## 2012-07-13 NOTE — Patient Instructions (Signed)
Limit your sodium (Salt) intake    It is important that you exercise regularly, at least 20 minutes 3 to 4 times per week.  If you develop chest pain or shortness of breath seek  medical attention.   Please check your hemoglobin A1c every 3 months   

## 2012-08-18 ENCOUNTER — Other Ambulatory Visit: Payer: Self-pay

## 2012-10-11 ENCOUNTER — Ambulatory Visit: Payer: Medicare PPO | Admitting: Internal Medicine

## 2012-10-16 ENCOUNTER — Ambulatory Visit (INDEPENDENT_AMBULATORY_CARE_PROVIDER_SITE_OTHER): Payer: Medicare PPO | Admitting: Internal Medicine

## 2012-10-16 ENCOUNTER — Encounter: Payer: Self-pay | Admitting: Internal Medicine

## 2012-10-16 VITALS — BP 140/70 | HR 75 | Temp 98.2°F | Resp 18 | Wt 178.0 lb

## 2012-10-16 DIAGNOSIS — E119 Type 2 diabetes mellitus without complications: Secondary | ICD-10-CM

## 2012-10-16 DIAGNOSIS — I10 Essential (primary) hypertension: Secondary | ICD-10-CM

## 2012-10-16 MED ORDER — ENALAPRIL-HYDROCHLOROTHIAZIDE 10-25 MG PO TABS: 1.0000 | ORAL_TABLET | Freq: Every day | ORAL | Status: DC

## 2012-10-16 MED ORDER — METFORMIN HCL 1000 MG PO TABS: 1000.0000 mg | ORAL_TABLET | Freq: Two times a day (BID) | ORAL | Status: DC

## 2012-10-16 MED ORDER — PRAVASTATIN SODIUM 40 MG PO TABS: 40.0000 mg | ORAL_TABLET | Freq: Every day | ORAL | Status: DC

## 2012-10-16 NOTE — Patient Instructions (Signed)
Limit your sodium (Salt) intake   Please check your hemoglobin A1c every 3 months    It is important that you exercise regularly, at least 20 minutes 3 to 4 times per week.  If you develop chest pain or shortness of breath seek  medical attention.   

## 2012-10-16 NOTE — Progress Notes (Signed)
Subjective:    Patient ID: Randy [name], male    DOB: [birth year], 72 y.o.   MRN: [id number]  HPI  Wt Readings from Last 3 Encounters:  10/16/12 178 lb (80.74 kg)  07/13/12 176 lb (79.833 kg)  07/06/11 169 lb 9.6 oz (76.51 kg)    72 year old patient who has type 2 diabetes. This has been controlled on metformin therapy. He is doing quite well. There is been some modest weight gain. He has dyslipidemia and hypertension. No concerns or complaints.  Past Medical History  Diagnosis Date  . Hypertension   . Diabetes mellitus     History   Social History  . Marital Status: Married    Spouse Name: N/A    Number of Children: N/A  . Years of Education: N/A   Occupational History  . Not on file.   Social History Main Topics  . Smoking status: Former Smoker    Quit date: 07/03/2001  . Smokeless tobacco: Former Neurosurgeon    Quit date: 07/04/1991  . Alcohol Use: 1.8 oz/week    3 Glasses of wine per week     Comment: daily alcohol wine with dinner  . Drug Use: No  . Sexually Active: Yes   Other Topics Concern  . Not on file   Social History Narrative  . No narrative on file    Past Surgical History  Procedure Laterality Date  . Esophagogastroduodenoscopy  07/05/2011    Procedure: ESOPHAGOGASTRODUODENOSCOPY (EGD);  Surgeon: Freddy Jaksch, MD;  Location: [facility] ENDOSCOPY;  Service: Endoscopy;  Laterality: N/A;  . Colonoscopy  07/06/2011    Procedure: COLONOSCOPY;  Surgeon: Vertell Novak., MD;  Location: [facility] ENDOSCOPY;  Service: Endoscopy;  Laterality: N/A;    No family history on file.  No Known Allergies  No current outpatient prescriptions on file prior to visit.   No current facility-administered medications on file prior to visit.    BP 140/70  Pulse 75  Temp(Src) 98.2 F (36.8 C) (Oral)  Resp 18  Wt 178 lb (80.74 kg)  BMI 27.07 kg/m2  SpO2 97%     Review of Systems  Constitutional: Negative for fever, chills, appetite change and fatigue.  HENT:  Negative for hearing loss, ear pain, congestion, sore throat, trouble swallowing, neck stiffness, dental problem, voice change and tinnitus.   Eyes: Negative for pain, discharge and visual disturbance.  Respiratory: Negative for cough, chest tightness, wheezing and stridor.   Cardiovascular: Negative for chest pain, palpitations and leg swelling.  Gastrointestinal: Negative for nausea, vomiting, abdominal pain, diarrhea, constipation, blood in stool and abdominal distention.  Genitourinary: Negative for urgency, hematuria, flank pain, discharge, difficulty urinating and genital sores.  Musculoskeletal: Negative for myalgias, back pain, joint swelling, arthralgias and gait problem.  Skin: Negative for rash.  Neurological: Negative for dizziness, syncope, speech difficulty, weakness, numbness and headaches.  Hematological: Negative for adenopathy. Does not bruise/bleed easily.  Psychiatric/Behavioral: Negative for behavioral problems and dysphoric mood. The patient is not nervous/anxious.        Objective:   Physical Exam  Constitutional: He is oriented to person, place, and time. He appears well-developed.  HENT:  Head: Normocephalic.  Right Ear: External ear normal.  Left Ear: External ear normal.  Eyes: Conjunctivae and EOM are normal.  Neck: Normal range of motion.  Cardiovascular: Normal rate and normal heart sounds.   Pulmonary/Chest: Breath sounds normal.  Abdominal: Bowel sounds are normal.  Musculoskeletal: Normal range of motion. He exhibits no edema and no tenderness.  Neurological: He is alert and oriented to person, place, and time.  Psychiatric: He has a normal mood and affect. His behavior is normal.          Assessment & Plan:  Diabetes mellitus. Will check a hemoglobin A1c weight loss encouraged Hypertension stable blood pressure 140/70 Dyslipidemia   Recheck 3 months

## 2012-10-17 ENCOUNTER — Encounter: Payer: Self-pay | Admitting: Internal Medicine

## 2012-10-17 ENCOUNTER — Other Ambulatory Visit: Payer: Self-pay | Admitting: *Deleted

## 2012-10-17 MED ORDER — GLUCOSE BLOOD VI STRP: 1.0000 | ORAL_STRIP | Freq: Every day | Status: DC | PRN

## 2012-10-17 NOTE — Telephone Encounter (Signed)
Pt notified Rx for Test strips was called into [facility].

## 2012-10-18 ENCOUNTER — Other Ambulatory Visit: Payer: Self-pay | Admitting: Internal Medicine

## 2012-10-18 MED ORDER — MELOXICAM 15 MG PO TABS: 15.0000 mg | ORAL_TABLET | Freq: Every day | ORAL | Status: DC

## 2013-01-16 ENCOUNTER — Encounter: Payer: Self-pay | Admitting: Internal Medicine

## 2013-02-06 ENCOUNTER — Other Ambulatory Visit: Payer: Self-pay

## 2013-04-01 ENCOUNTER — Telehealth: Payer: Self-pay | Admitting: Internal Medicine

## 2013-04-01 NOTE — Telephone Encounter (Signed)
Patient Information:  Caller Name: [place]  Phone: [phone]  Patient: Randy [name], Randy [name]  Gender: Male  DOB: 06/30/1941  Age: 72 Years  PCP: Eleonore Chiquito (Family Practice > 39yrs old)  Office Follow Up:  Does the office need to follow up with this patient?: Yes  Instructions For The Office: Discuss with PCP and call back today.  RN Note:  Intermittent episodes of 3-5 stools on 4 out of 7 days per week.  Concerned about [place] out [place] with moderate diarrhea.  Asking it Metformin may be cause.     Symptoms  Reason For Call & Symptoms: Worsening frequency of diarrhea since mid July.  Asking if could be related to Metformin.  FBS 140 at 0800.  Recent A1c was 6.2.  Going to [place] for 12 days on 04/24/13.  Reviewed Health History In EMR: No  Reviewed Medications In EMR: No  Reviewed Allergies In EMR: Yes  Reviewed Surgeries / Procedures: Yes  Date of Onset of Symptoms: 01/15/2013  Treatments Tried: Pepto Bismol, Kaopectate, switched to decaf coffee and tea, eliminated spicy foods.  Treatments Tried Worked: Yes  Guideline(s) Used:  Diarrhea  Disposition Per Guideline:   Callback by PCP Today  Reason For Disposition Reached:   Age > 70 years  Advice Given:  Call Back If:  Signs of dehydration occur (e.g., no urine for more than 12 hours, very dry mouth, lightheaded, etc.)  You become worse.  Patient Will Follow Care Advice:  YES

## 2013-04-11 ENCOUNTER — Ambulatory Visit (INDEPENDENT_AMBULATORY_CARE_PROVIDER_SITE_OTHER): Payer: Medicare PPO

## 2013-04-11 ENCOUNTER — Ambulatory Visit (INDEPENDENT_AMBULATORY_CARE_PROVIDER_SITE_OTHER): Payer: Medicare PPO | Admitting: *Deleted

## 2013-04-11 DIAGNOSIS — Z23 Encounter for immunization: Secondary | ICD-10-CM

## 2013-09-20 ENCOUNTER — Other Ambulatory Visit: Payer: Self-pay | Admitting: Internal Medicine

## 2013-09-23 ENCOUNTER — Other Ambulatory Visit: Payer: Self-pay | Admitting: Internal Medicine

## 2013-12-12 ENCOUNTER — Other Ambulatory Visit (INDEPENDENT_AMBULATORY_CARE_PROVIDER_SITE_OTHER): Payer: Medicare PPO

## 2013-12-12 DIAGNOSIS — I10 Essential (primary) hypertension: Secondary | ICD-10-CM

## 2013-12-12 DIAGNOSIS — E119 Type 2 diabetes mellitus without complications: Secondary | ICD-10-CM

## 2013-12-12 DIAGNOSIS — E785 Hyperlipidemia, unspecified: Secondary | ICD-10-CM

## 2013-12-12 DIAGNOSIS — Z125 Encounter for screening for malignant neoplasm of prostate: Secondary | ICD-10-CM

## 2013-12-12 DIAGNOSIS — Z0000 Encounter for general adult medical examination without abnormal findings: Secondary | ICD-10-CM

## 2013-12-12 LAB — POCT URINALYSIS DIPSTICK
Bilirubin, UA: NEGATIVE
Glucose, UA: NEGATIVE
KETONES UA: NEGATIVE
Nitrite, UA: POSITIVE
PH UA: 7
PROTEIN UA: NEGATIVE
RBC UA: NEGATIVE
SPEC GRAV UA: 1.02
Urobilinogen, UA: 0.2

## 2013-12-12 LAB — PSA: PSA: 2.63 ng/mL (ref 0.10–4.00)

## 2013-12-12 LAB — LIPID PANEL
CHOLESTEROL: 143 mg/dL (ref 0–200)
HDL: 66.2 mg/dL (ref >39.00)
LDL CALC: 62 mg/dL (ref 0–99)
NonHDL: 76.8
Total CHOL/HDL Ratio: 2
Triglycerides: 76 mg/dL (ref 0.0–149.0)
VLDL: 15.2 mg/dL (ref 0.0–40.0)

## 2013-12-12 LAB — MICROALBUMIN / CREATININE URINE RATIO
Creatinine,U: 122.7 mg/dL
Microalb Creat Ratio: 0.2 mg/g (ref 0.0–30.0)
Microalb, Ur: 0.2 mg/dL (ref 0.0–1.9)

## 2013-12-12 LAB — CBC WITH DIFFERENTIAL/PLATELET
BASOS ABS: 0 10*3/uL (ref 0.0–0.1)
BASOS PCT: 0.3 % (ref 0.0–3.0)
EOS ABS: 0.4 10*3/uL (ref 0.0–0.7)
Eosinophils Relative: 5.6 % — ABNORMAL HIGH (ref 0.0–5.0)
HCT: 42.9 % (ref 39.0–52.0)
Hemoglobin: 14.1 g/dL (ref 13.0–17.0)
LYMPHS PCT: 32 % (ref 12.0–46.0)
Lymphs Abs: 2.2 10*3/uL (ref 0.7–4.0)
MCHC: 32.9 g/dL (ref 30.0–36.0)
MCV: 97.7 fl (ref 78.0–100.0)
MONO ABS: 0.6 10*3/uL (ref 0.1–1.0)
Monocytes Relative: 8.6 % (ref 3.0–12.0)
NEUTROS PCT: 53.5 % (ref 43.0–77.0)
Neutro Abs: 3.7 10*3/uL (ref 1.4–7.7)
PLATELETS: 231 10*3/uL (ref 150.0–400.0)
RBC: 4.39 Mil/uL (ref 4.22–5.81)
RDW: 13.4 % (ref 11.5–15.5)
WBC: 6.9 10*3/uL (ref 4.0–10.5)

## 2013-12-12 LAB — BASIC METABOLIC PANEL
BUN: 19 mg/dL (ref 6–23)
CALCIUM: 9.4 mg/dL (ref 8.4–10.5)
CHLORIDE: 100 meq/L (ref 96–112)
CO2: 31 meq/L (ref 19–32)
CREATININE: 0.9 mg/dL (ref 0.4–1.5)
GFR: 93.84 mL/min (ref >60.00)
Glucose, Bld: 111 mg/dL — ABNORMAL HIGH (ref 70–99)
Potassium: 4.9 mEq/L (ref 3.5–5.1)
Sodium: 139 mEq/L (ref 135–145)

## 2013-12-12 LAB — HEPATIC FUNCTION PANEL
ALK PHOS: 38 U/L — AB (ref 39–117)
ALT: 17 U/L (ref 0–53)
AST: 16 U/L (ref 0–37)
Albumin: 3.8 g/dL (ref 3.5–5.2)
BILIRUBIN DIRECT: 0.1 mg/dL (ref 0.0–0.3)
BILIRUBIN TOTAL: 0.5 mg/dL (ref 0.2–1.2)
TOTAL PROTEIN: 6 g/dL (ref 6.0–8.3)

## 2013-12-12 LAB — HEMOGLOBIN A1C: Hgb A1c MFr Bld: 6 % (ref 4.6–6.5)

## 2013-12-12 LAB — TSH: TSH: 2.09 u[IU]/mL (ref 0.35–4.50)

## 2013-12-19 ENCOUNTER — Encounter: Payer: Self-pay | Admitting: Internal Medicine

## 2013-12-19 ENCOUNTER — Ambulatory Visit (INDEPENDENT_AMBULATORY_CARE_PROVIDER_SITE_OTHER): Payer: Medicare PPO | Admitting: Internal Medicine

## 2013-12-19 VITALS — BP 120/72 | HR 76 | Temp 97.5°F | Resp 20 | Ht 68.0 in | Wt 158.0 lb

## 2013-12-19 DIAGNOSIS — I10 Essential (primary) hypertension: Secondary | ICD-10-CM

## 2013-12-19 DIAGNOSIS — E119 Type 2 diabetes mellitus without complications: Secondary | ICD-10-CM

## 2013-12-19 DIAGNOSIS — Z0000 Encounter for general adult medical examination without abnormal findings: Secondary | ICD-10-CM

## 2013-12-19 DIAGNOSIS — Z23 Encounter for immunization: Secondary | ICD-10-CM

## 2013-12-19 DIAGNOSIS — E785 Hyperlipidemia, unspecified: Secondary | ICD-10-CM

## 2013-12-19 MED ORDER — ENALAPRIL-HYDROCHLOROTHIAZIDE 10-25 MG PO TABS: ORAL_TABLET | ORAL | Status: DC

## 2013-12-19 MED ORDER — PRAVASTATIN SODIUM 40 MG PO TABS: ORAL_TABLET | ORAL | Status: DC

## 2013-12-19 MED ORDER — METFORMIN HCL 1000 MG PO TABS: 1000.0000 mg | ORAL_TABLET | Freq: Every day | ORAL | Status: DC

## 2013-12-19 NOTE — Patient Instructions (Signed)
Limit your sodium (Salt) intake  Please check your blood pressure on a regular basis.  If it is consistently greater than 150/90, please make an office appointment.     It is important that you exercise regularly, at least 20 minutes 3 to 4 times per week.  If you develop chest pain or shortness of breath seek  medical attention. 

## 2013-12-19 NOTE — Progress Notes (Signed)
Subjective:    Patient ID: Randy [name], male    DOB: 1941-04-07, 73 y.o.   MRN: [id number]  HPI 73 -year-old patient who is seen today for a preventive health exam.  Chronic medical problems include type 2 diabetes hypertension and dyslipidemia. He was hospitalized about 3years ago for upper GI bleeding. He had upper and lower endoscopy at that time revealed some gastric erosions as well as AVMs and mild diverticular disease. No other hospital admissions  Past medical history otherwise unremarkable Social history he is retired from [employer] ; 3 daughters and 5 grandchildren former smoker of 30 years duration but discontinued in 10-21-1983.  Family history father died at age 63 from cerebral vascular disease had diabetes and hypertension mother died age 67 of a brain tumor one brother in reasonably good health with ongoing tobacco use  Medicare wellness  1. Risk factors, based on past  M,S,F history-  cardiovascular risk factors include a history of diabetes hypertension and dyslipidemia  2.  Physical activities: Fairly active plays golf on a regular basis no exercise limitations  3.  Depression/mood: No history depression or mood disorder  4.  Hearing: No deficits  5.  ADL's: Independent in all aspects of daily living  6.  Fall risk: Low  7.  Home safety: No problems identified  8.  Height weight, and visual acuity; height and weight stable no change in visual acuity 9.  Counseling: Continue active lifestyle maintain ideal body weight home blood pressure and blood sugar monitoring  10. Lab orders based on risk factors: Hemoglobin A1c checked  11. Referral : Not appropriate at this time  12. Care plan: Continue heart healthy diet regular exercise regimen  13. Cognitive assessment: Alert and oriented normal affect. No cognitive dysfunction    Past Medical History  Diagnosis Date  . Hypertension   . Diabetes mellitus     History   Social History  . Marital Status:  Married    Spouse Name: N/A    Number of Children: N/A  . Years of Education: N/A   Occupational History  . Not on file.   Social History Main Topics  . Smoking status: Former Smoker    Quit date: 07/03/2001  . Smokeless tobacco: Former Systems developer    Quit date: 07/04/1991  . Alcohol Use: 1.8 oz/week    3 Glasses of wine per week     Comment: daily alcohol wine with dinner  . Drug Use: No  . Sexual Activity: Yes   Other Topics Concern  . Not on file   Social History Narrative  . No narrative on file    Past Surgical History  Procedure Laterality Date  . Esophagogastroduodenoscopy  07/05/2011    Procedure: ESOPHAGOGASTRODUODENOSCOPY (EGD);  Surgeon: Landry Dyke, MD;  Location: [facility] ENDOSCOPY;  Service: Endoscopy;  Laterality: N/A;  . Colonoscopy  07/06/2011    Procedure: COLONOSCOPY;  Surgeon: Winfield Cunas., MD;  Location: [facility] ENDOSCOPY;  Service: Endoscopy;  Laterality: N/A;    No family history on file.  No Known Allergies  Current Outpatient Prescriptions on File Prior to Visit  Medication Sig Dispense Refill  . enalapril-hydrochlorothiazide (VASERETIC) 10-25 MG per tablet TAKE 1 TABLET BY MOUTH DAILY.  90 tablet  0  . glucose blood (TRUETEST TEST) test strip 1 each by Other route daily as needed for other. Use as instructed  100 each  3  . metFORMIN (GLUCOPHAGE) 1000 MG tablet TAKE 1 TABLET BY MOUTH TWICE DAILY BEFORE  MEALS  180 tablet  0  . pravastatin (PRAVACHOL) 40 MG tablet TAKE 1 TABLET BY MOUTH EVERY DAY  90 tablet  3   No current facility-administered medications on file prior to visit.    BP 120/72  Pulse 76  Temp(Src) 97.5 F (36.4 C) (Oral)  Resp 20  Ht 5\' 8"  (1.727 m)  Wt 158 lb (71.668 kg)  BMI 24.03 kg/m2  SpO2 98%       Review of Systems  Constitutional: Negative for fever, chills, activity change, appetite change and fatigue.  HENT: Negative for congestion, dental problem, ear pain, hearing loss, mouth sores, rhinorrhea, sinus pressure,  sneezing, tinnitus, trouble swallowing and voice change.   Eyes: Negative for photophobia, pain, redness and visual disturbance.  Respiratory: Negative for apnea, cough, choking, chest tightness, shortness of breath and wheezing.   Cardiovascular: Negative for chest pain, palpitations and leg swelling.  Gastrointestinal: Negative for nausea, vomiting, abdominal pain, diarrhea, constipation, blood in stool, abdominal distention, anal bleeding and rectal pain.  Genitourinary: Negative for dysuria, urgency, frequency, hematuria, flank pain, decreased urine volume, discharge, penile swelling, scrotal swelling, difficulty urinating, genital sores and testicular pain.  Musculoskeletal: Negative for arthralgias, back pain, gait problem, joint swelling, myalgias, neck pain and neck stiffness.  Skin: Negative for color change, rash and wound.  Neurological: Negative for dizziness, tremors, seizures, syncope, facial asymmetry, speech difficulty, weakness, light-headedness, numbness and headaches.  Hematological: Negative for adenopathy. Does not bruise/bleed easily.  Psychiatric/Behavioral: Negative for suicidal ideas, hallucinations, behavioral problems, confusion, sleep disturbance, self-injury, dysphoric mood, decreased concentration and agitation. The patient is not nervous/anxious.        Objective:   Physical Exam  Constitutional: He appears well-developed and well-nourished.  Blood pressure 140/80  HENT:  Head: Normocephalic and atraumatic.  Right Ear: External ear normal.  Left Ear: External ear normal.  Nose: Nose normal.  Mouth/Throat: Oropharynx is clear and moist.  Eyes: Conjunctivae and EOM are normal. Pupils are equal, round, and reactive to light. No scleral icterus.  Neck: Normal range of motion. Neck supple. No JVD present. No thyromegaly present.  Cardiovascular: Regular rhythm, normal heart sounds and intact distal pulses.  Exam reveals no gallop and no friction rub.   No murmur  heard. Pulmonary/Chest: Effort normal and breath sounds normal. He exhibits no tenderness.  Abdominal: Soft. Bowel sounds are normal. He exhibits no distension and no mass. There is no tenderness.  Genitourinary: Prostate normal and penis normal. Guaiac negative stool.  Musculoskeletal: Normal range of motion. He exhibits no edema and no tenderness.  Lymphadenopathy:    He has no cervical adenopathy.  Neurological: He is alert. He has normal reflexes. No cranial nerve deficit. Coordination normal.  Skin: Skin is warm and dry. No rash noted.  Psychiatric: He has a normal mood and affect. His behavior is normal.          Assessment & Plan:   Preventive health exam Hypertension.  Well controlled Dyslipidemia .  Continue statin therapy Diabetes mellitus.  Well controlled.  Hemoglobin A1c 6 point 0   Recheck 3 months

## 2013-12-19 NOTE — Progress Notes (Signed)
   Subjective:    Patient ID: Randy [name], male    DOB: August 19, 1940, 73 y.o.   MRN: [id number]  HPI  Wt Readings from Last 3 Encounters:  12/19/13 158 lb (71.668 kg)  10/16/12 178 lb (80.74 kg)  07/13/12 176 lb (79.833 kg)    Review of Systems     Objective:   Physical Exam        Assessment & Plan:

## 2013-12-20 ENCOUNTER — Telehealth: Payer: Self-pay | Admitting: Internal Medicine

## 2013-12-20 NOTE — Telephone Encounter (Signed)
Relevant patient education assigned to patient using Emmi. ° °

## 2014-01-16 ENCOUNTER — Other Ambulatory Visit: Payer: Self-pay | Admitting: Internal Medicine

## 2014-05-01 ENCOUNTER — Ambulatory Visit (INDEPENDENT_AMBULATORY_CARE_PROVIDER_SITE_OTHER): Payer: Medicare PPO

## 2014-05-01 DIAGNOSIS — Z23 Encounter for immunization: Secondary | ICD-10-CM

## 2014-09-05 ENCOUNTER — Encounter: Payer: Self-pay | Admitting: Internal Medicine

## 2014-09-05 NOTE — Telephone Encounter (Signed)
Pt would like to know if you will switch him to Simvastatin instead of Pravastatin  due to cost?

## 2014-09-08 MED ORDER — SIMVASTATIN 20 MG PO TABS: 20.0000 mg | ORAL_TABLET | Freq: Every day | ORAL | Status: DC

## 2014-09-27 ENCOUNTER — Other Ambulatory Visit: Payer: Self-pay | Admitting: Internal Medicine

## 2014-10-01 ENCOUNTER — Encounter: Payer: Self-pay | Admitting: Internal Medicine

## 2014-10-01 MED ORDER — ENALAPRIL-HYDROCHLOROTHIAZIDE 10-25 MG PO TABS: 1.0000 | ORAL_TABLET | Freq: Every day | ORAL | Status: DC

## 2015-02-27 ENCOUNTER — Telehealth: Payer: Self-pay | Admitting: *Deleted

## 2015-02-27 MED ORDER — METFORMIN HCL 1000 MG PO TABS: 1000.0000 mg | ORAL_TABLET | Freq: Every day | ORAL | Status: DC

## 2015-02-27 NOTE — Telephone Encounter (Signed)
Patient requesting refill on Metformin last office visit in 2015 also needs dose clarification for refill.

## 2015-02-27 NOTE — Telephone Encounter (Signed)
Spoke to pt, told him he is overdue for a physical and will give you a 3 month supply. Also I have that you are taking 1000 mg twice a day. Pt said the dosage was decreased when he was in last due to weight loss and now only taking one daily. Told pt okay I will change to once daily but need to schedule appt. Pt verbalized understanding and will schedule physical. Rx sent to pharmacy.

## 2015-03-23 ENCOUNTER — Encounter: Payer: Self-pay | Admitting: Internal Medicine

## 2015-03-23 MED ORDER — ENALAPRIL-HYDROCHLOROTHIAZIDE 10-25 MG PO TABS: 1.0000 | ORAL_TABLET | Freq: Every day | ORAL | Status: DC

## 2015-03-25 ENCOUNTER — Other Ambulatory Visit: Payer: Self-pay | Admitting: Internal Medicine

## 2015-04-20 ENCOUNTER — Encounter: Payer: Self-pay | Admitting: Internal Medicine

## 2015-04-20 ENCOUNTER — Other Ambulatory Visit: Payer: Self-pay

## 2015-04-22 MED ORDER — TRUE METRIX LEVEL 1 LOW VI SOLN: Status: DC

## 2015-04-22 MED ORDER — GLUCOSE BLOOD VI STRP: ORAL_STRIP | Status: DC

## 2015-04-22 MED ORDER — TRUEPLUS LANCETS 33G MISC: Status: DC

## 2015-04-22 MED ORDER — TRUE METRIX AIR GLUCOSE METER W/DEVICE KIT: 1.0000 | PACK | Freq: Every day | Status: DC

## 2015-04-27 ENCOUNTER — Encounter: Payer: Self-pay | Admitting: Internal Medicine

## 2015-05-01 ENCOUNTER — Other Ambulatory Visit (INDEPENDENT_AMBULATORY_CARE_PROVIDER_SITE_OTHER): Payer: Medicare PPO

## 2015-05-01 DIAGNOSIS — E785 Hyperlipidemia, unspecified: Secondary | ICD-10-CM

## 2015-05-01 DIAGNOSIS — Z0000 Encounter for general adult medical examination without abnormal findings: Secondary | ICD-10-CM | POA: Diagnosis not present

## 2015-05-01 DIAGNOSIS — Z125 Encounter for screening for malignant neoplasm of prostate: Secondary | ICD-10-CM | POA: Diagnosis not present

## 2015-05-01 LAB — MICROALBUMIN / CREATININE URINE RATIO
Creatinine,U: 108.9 mg/dL
Microalb Creat Ratio: 0.6 mg/g (ref 0.0–30.0)
Microalb, Ur: <0.7 mg/dL (ref 0.0–1.9)

## 2015-05-01 LAB — CBC WITH DIFFERENTIAL/PLATELET
BASOS ABS: 0 10*3/uL (ref 0.0–0.1)
Basophils Relative: 0.3 % (ref 0.0–3.0)
EOS ABS: 0.4 10*3/uL (ref 0.0–0.7)
Eosinophils Relative: 5 % (ref 0.0–5.0)
HEMATOCRIT: 44.3 % (ref 39.0–52.0)
Hemoglobin: 14.3 g/dL (ref 13.0–17.0)
LYMPHS PCT: 27.6 % (ref 12.0–46.0)
Lymphs Abs: 2.1 10*3/uL (ref 0.7–4.0)
MCHC: 32.2 g/dL (ref 30.0–36.0)
MCV: 96.8 fl (ref 78.0–100.0)
Monocytes Absolute: 0.7 10*3/uL (ref 0.1–1.0)
Monocytes Relative: 8.8 % (ref 3.0–12.0)
NEUTROS ABS: 4.5 10*3/uL (ref 1.4–7.7)
Neutrophils Relative %: 58.3 % (ref 43.0–77.0)
PLATELETS: 241 10*3/uL (ref 150.0–400.0)
RBC: 4.57 Mil/uL (ref 4.22–5.81)
RDW: 13.4 % (ref 11.5–15.5)
WBC: 7.7 10*3/uL (ref 4.0–10.5)

## 2015-05-01 LAB — POCT URINALYSIS DIPSTICK
Bilirubin, UA: NEGATIVE
Glucose, UA: NEGATIVE
Leukocytes, UA: NEGATIVE
Nitrite, UA: NEGATIVE
PH UA: 7
PROTEIN UA: NEGATIVE
RBC UA: NEGATIVE
SPEC GRAV UA: 1.02
UROBILINOGEN UA: 0.2

## 2015-05-01 LAB — BASIC METABOLIC PANEL
BUN: 20 mg/dL (ref 6–23)
CALCIUM: 10.5 mg/dL (ref 8.4–10.5)
CO2: 34 meq/L — AB (ref 19–32)
CREATININE: 1.04 mg/dL (ref 0.40–1.50)
Chloride: 98 mEq/L (ref 96–112)
GFR: 74.07 mL/min (ref >60.00)
Glucose, Bld: 116 mg/dL — ABNORMAL HIGH (ref 70–99)
Potassium: 5.4 mEq/L — ABNORMAL HIGH (ref 3.5–5.1)
Sodium: 141 mEq/L (ref 135–145)

## 2015-05-01 LAB — LIPID PANEL
CHOL/HDL RATIO: 2
CHOLESTEROL: 149 mg/dL (ref 0–200)
HDL: 65.7 mg/dL (ref >39.00)
LDL CALC: 67 mg/dL (ref 0–99)
NONHDL: 83.08
Triglycerides: 82 mg/dL (ref 0.0–149.0)
VLDL: 16.4 mg/dL (ref 0.0–40.0)

## 2015-05-01 LAB — HEPATIC FUNCTION PANEL
ALBUMIN: 4.2 g/dL (ref 3.5–5.2)
ALT: 13 U/L (ref 0–53)
AST: 13 U/L (ref 0–37)
Alkaline Phosphatase: 48 U/L (ref 39–117)
BILIRUBIN TOTAL: 0.5 mg/dL (ref 0.2–1.2)
Bilirubin, Direct: 0.1 mg/dL (ref 0.0–0.3)
Total Protein: 6.5 g/dL (ref 6.0–8.3)

## 2015-05-01 LAB — TSH: TSH: 1.52 u[IU]/mL (ref 0.35–4.50)

## 2015-05-01 LAB — PSA: PSA: 4.95 ng/mL — AB (ref 0.10–4.00)

## 2015-05-01 LAB — HEMOGLOBIN A1C: HEMOGLOBIN A1C: 6.5 % (ref 4.6–6.5)

## 2015-05-08 ENCOUNTER — Encounter: Payer: Self-pay | Admitting: Internal Medicine

## 2015-05-08 ENCOUNTER — Ambulatory Visit (INDEPENDENT_AMBULATORY_CARE_PROVIDER_SITE_OTHER): Payer: Medicare PPO | Admitting: Internal Medicine

## 2015-05-08 VITALS — BP 140/66 | HR 76 | Temp 97.7°F | Resp 20 | Ht 67.5 in | Wt 172.0 lb

## 2015-05-08 DIAGNOSIS — E119 Type 2 diabetes mellitus without complications: Secondary | ICD-10-CM

## 2015-05-08 DIAGNOSIS — Z23 Encounter for immunization: Secondary | ICD-10-CM | POA: Diagnosis not present

## 2015-05-08 DIAGNOSIS — Z0000 Encounter for general adult medical examination without abnormal findings: Secondary | ICD-10-CM | POA: Diagnosis not present

## 2015-05-08 DIAGNOSIS — I10 Essential (primary) hypertension: Secondary | ICD-10-CM

## 2015-05-08 DIAGNOSIS — E785 Hyperlipidemia, unspecified: Secondary | ICD-10-CM

## 2015-05-08 NOTE — Patient Instructions (Signed)
Limit your sodium (Salt) intake   Please check your hemoglobin A1c every 3 -6 months    It is important that you exercise regularly, at least 20 minutes 3 to 4 times per week.  If you develop chest pain or shortness of breath seek  medical attention.  Please check your blood pressure on a regular basis.  If it is consistently greater than 150/90, please make an office appointment.    Health Maintenance, Male A healthy lifestyle and preventative care can promote health and wellness.  Maintain regular health, dental, and eye exams.  Eat a healthy diet. Foods like vegetables, fruits, whole grains, low-fat dairy products, and lean protein foods contain the nutrients you need and are low in calories. Decrease your intake of foods high in solid fats, added sugars, and salt. Get information about a proper diet from your health care provider, if necessary.  Regular physical exercise is one of the most important things you can do for your health. Most adults should get at least 150 minutes of moderate-intensity exercise (any activity that increases your heart rate and causes you to sweat) each week. In addition, most adults need muscle-strengthening exercises on 2 or more days a week.   Maintain a healthy weight. The body mass index (BMI) is a screening tool to identify possible weight problems. It provides an estimate of body fat based on height and weight. Your health care provider can find your BMI and can help you achieve or maintain a healthy weight. For males 20 years and older:  A BMI below 18.5 is considered underweight.  A BMI of 18.5 to 24.9 is normal.  A BMI of 25 to 29.9 is considered overweight.  A BMI of 30 and above is considered obese.  Maintain normal blood lipids and cholesterol by exercising and minimizing your intake of saturated fat. Eat a balanced diet with plenty of fruits and vegetables. Blood tests for lipids and cholesterol should begin at age 5 and be repeated every 5  years. If your lipid or cholesterol levels are high, you are over age 76, or you are at high risk for heart disease, you may need your cholesterol levels checked more frequently.Ongoing high lipid and cholesterol levels should be treated with medicines if diet and exercise are not working.  If you smoke, find out from your health care provider how to quit. If you do not use tobacco, do not start.  Lung cancer screening is recommended for adults aged 81-80 years who are at high risk for developing lung cancer because of a history of smoking. A yearly low-dose CT scan of the lungs is recommended for people who have at least a 30-pack-year history of smoking and are current smokers or have quit within the past 15 years. A pack year of smoking is smoking an average of 1 pack of cigarettes a day for 1 year (for example, a 30-pack-year history of smoking could mean smoking 1 pack a day for 30 years or 2 packs a day for 15 years). Yearly screening should continue until the smoker has stopped smoking for at least 15 years. Yearly screening should be stopped for people who develop a health problem that would prevent them from having lung cancer treatment.  If you choose to drink alcohol, do not have more than 2 drinks per day. One drink is considered to be 12 oz (360 mL) of beer, 5 oz (150 mL) of wine, or 1.5 oz (45 mL) of liquor.  Avoid the use  of street drugs. Do not share needles with anyone. Ask for help if you need support or instructions about stopping the use of drugs.  High blood pressure causes heart disease and increases the risk of stroke. High blood pressure is more likely to develop in:  People who have blood pressure in the end of the normal range (100-139/85-89 mm Hg).  People who are overweight or obese.  People who are African American.  If you are 23-90 years of age, have your blood pressure checked every 3-5 years. If you are 1 years of age or older, have your blood pressure checked  every year. You should have your blood pressure measured twice--once when you are at a hospital or clinic, and once when you are not at a hospital or clinic. Record the average of the two measurements. To check your blood pressure when you are not at a hospital or clinic, you can use:  An automated blood pressure machine at a pharmacy.  A home blood pressure monitor.  If you are 5-47 years old, ask your health care provider if you should take aspirin to prevent heart disease.  Diabetes screening involves taking a blood sample to check your fasting blood sugar level. This should be done once every 3 years after age 59 if you are at a normal weight and without risk factors for diabetes. Testing should be considered at a younger age or be carried out more frequently if you are overweight and have at least 1 risk factor for diabetes.  Colorectal cancer can be detected and often prevented. Most routine colorectal cancer screening begins at the age of 18 and continues through age 49. However, your health care provider may recommend screening at an earlier age if you have risk factors for colon cancer. On a yearly basis, your health care provider may provide home test kits to check for hidden blood in the stool. A small camera at the end of a tube may be used to directly examine the colon (sigmoidoscopy or colonoscopy) to detect the earliest forms of colorectal cancer. Talk to your health care provider about this at age 53 when routine screening begins. A direct exam of the colon should be repeated every 5-10 years through age 35, unless early forms of precancerous polyps or small growths are found.  People who are at an increased risk for hepatitis B should be screened for this virus. You are considered at high risk for hepatitis B if:  You were born in a country where hepatitis B occurs often. Talk with your health care provider about which countries are considered high risk.  Your parents were born in a  high-risk country and you have not received a shot to protect against hepatitis B (hepatitis B vaccine).  You have HIV or AIDS.  You use needles to inject street drugs.  You live with, or have sex with, someone who has hepatitis B.  You are a man who has sex with other men (MSM).  You get hemodialysis treatment.  You take certain medicines for conditions like cancer, organ transplantation, and autoimmune conditions.  Hepatitis C blood testing is recommended for all people born from [birth year] through 1965 and any individual with known risk factors for hepatitis C.  Healthy men should no longer receive prostate-specific antigen (PSA) blood tests as part of routine cancer screening. Talk to your health care provider about prostate cancer screening.  Testicular cancer screening is not recommended for adolescents or adult males who have no symptoms.  Screening includes self-exam, a health care provider exam, and other screening tests. Consult with your health care provider about any symptoms you have or any concerns you have about testicular cancer.  Practice safe sex. Use condoms and avoid high-risk sexual practices to reduce the spread of sexually transmitted infections (STIs).  You should be screened for STIs, including gonorrhea and chlamydia if:  You are sexually active and are younger than 24 years.  You are older than 24 years, and your health care provider tells you that you are at risk for this type of infection.  Your sexual activity has changed since you were last screened, and you are at an increased risk for chlamydia or gonorrhea. Ask your health care provider if you are at risk.  If you are at risk of being infected with HIV, it is recommended that you take a prescription medicine daily to prevent HIV infection. This is called pre-exposure prophylaxis (PrEP). You are considered at risk if:  You are a man who has sex with other men (MSM).  You are a heterosexual man who is  sexually active with multiple partners.  You take drugs by injection.  You are sexually active with a partner who has HIV.  Talk with your health care provider about whether you are at high risk of being infected with HIV. If you choose to begin PrEP, you should first be tested for HIV. You should then be tested every 3 months for as long as you are taking PrEP.  Use sunscreen. Apply sunscreen liberally and repeatedly throughout the day. You should seek shade when your shadow is shorter than you. Protect yourself by wearing long sleeves, pants, a wide-brimmed hat, and sunglasses year round whenever you are outdoors.  Tell your health care provider of new moles or changes in moles, especially if there is a change in shape or color. Also, tell your health care provider if a mole is larger than the size of a pencil eraser.  A one-time screening for abdominal aortic aneurysm (AAA) and surgical repair of large AAAs by ultrasound is recommended for men aged 61-75 years who are current or former smokers.  Stay current with your vaccines (immunizations).   This information is not intended to replace advice given to you by your health care provider. Make sure you discuss any questions you have with your health care provider.   Document Released: 12/17/2007 Document Revised: 07/11/2014 Document Reviewed: 11/15/2010 Elsevier Interactive Patient Education [employer].

## 2015-05-08 NOTE — Progress Notes (Signed)
Pre visit review using our clinic review tool, if applicable. No additional management support is needed unless otherwise documented below in the visit note. 

## 2015-05-08 NOTE — Progress Notes (Signed)
Subjective:    Patient ID: Randy [name], male    DOB: [birth year], 74 y.o.   MRN: [id number]  HPI 58  -year-old patient who is seen today for a preventive health exam.  Chronic medical problems include type 2 diabetes hypertension and dyslipidemia. He was hospitalized about 4 years ago for upper GI bleeding. He had upper and lower endoscopy at that time revealed some gastric erosions as well as AVMs and mild diverticular disease. No other hospital admissions  Past medical history otherwise unremarkable Social history he is retired from [employer] ; 3 daughters and 5 grandchildren former smoker of 30 years duration but discontinued in 08-22-83.  Family history father died at age 52 from cerebral vascular disease had diabetes and hypertension mother died age 40 of a brain tumor one brother in reasonably good health with ongoing tobacco use  Lab Results  Component Value Date   HGBA1C 6.5 05/01/2015    Wt Readings from Last 3 Encounters:  05/08/15 172 lb (78.019 kg)  12/19/13 158 lb (71.668 kg)  10/16/12 178 lb (80.74 kg)    BP Readings from Last 3 Encounters:  05/08/15 140/66  12/19/13 120/72  10/16/12 140/70    Medicare wellness  1. Risk factors, based on past  M,S,F history-  cardiovascular risk factors include a history of diabetes hypertension and dyslipidemia  2.  Physical activities: Fairly active plays golf on a regular basis no exercise limitations  3.  Depression/mood: No history depression or mood disorder  4.  Hearing: No deficits  5.  ADL's: Independent in all aspects of daily living  6.  Fall risk: Low  7.  Home safety: No problems identified  8.  Height weight, and visual acuity; height and weight stable no change in visual acuity 9.  Counseling: Continue active lifestyle maintain ideal body weight home blood pressure and blood sugar monitoring  10. Lab orders based on risk factors: Hemoglobin A1c checked  11. Referral : Not appropriate at this  time  12. Care plan: Continue heart healthy diet regular exercise regimen  13. Cognitive assessment: Alert and oriented normal affect. No cognitive dysfunction  14. annual preventive health examinations.  Will have colonoscopies at 10 year intervals.  Yearly eye examinations recommended.    15.  Provider list includes primary care ophthalmology GI    Past Medical History  Diagnosis Date  . Hypertension   . Diabetes mellitus     Social History   Social History  . Marital Status: Married    Spouse Name: N/A  . Number of Children: N/A  . Years of Education: N/A   Occupational History  . Not on file.   Social History Main Topics  . Smoking status: Former Smoker    Quit date: 07/03/2001  . Smokeless tobacco: Former Systems developer    Quit date: 07/04/1991  . Alcohol Use: 1.8 oz/week    3 Glasses of wine per week     Comment: daily alcohol wine with dinner  . Drug Use: No  . Sexual Activity: Yes   Other Topics Concern  . Not on file   Social History Narrative    Past Surgical History  Procedure Laterality Date  . Esophagogastroduodenoscopy  07/05/2011    Procedure: ESOPHAGOGASTRODUODENOSCOPY (EGD);  Surgeon: Landry Dyke, MD;  Location: [facility] ENDOSCOPY;  Service: Endoscopy;  Laterality: N/A;  . Colonoscopy  07/06/2011    Procedure: COLONOSCOPY;  Surgeon: Winfield Cunas., MD;  Location: [facility] ENDOSCOPY;  Service: Endoscopy;  Laterality: N/A;  No family history on file.  No Known Allergies  Current Outpatient Prescriptions on File Prior to Visit  Medication Sig Dispense Refill  . Blood Glucose Calibration (TRUE METRIX LEVEL 1) LOW SOLN USE TO CHECK METER PRIOR TO TESTING SUGAR 3 each 3  . Blood Glucose Monitoring Suppl (TRUE METRIX AIR GLUCOSE METER) W/DEVICE KIT 1 each by Does not apply route daily. 1 kit 0  . enalapril-hydrochlorothiazide (VASERETIC) 10-25 MG per tablet Take 1 tablet by mouth daily. 90 tablet 0  . enalapril-hydrochlorothiazide (VASERETIC) 10-25 MG per  tablet TAKE 1 TABLET BY MOUTH DAILY. 90 tablet 2  . glucose blood test strip USE TO CHECK BLOOD SUGAR DAILY AND PRN 300 each 3  . metFORMIN (GLUCOPHAGE) 1000 MG tablet Take 1 tablet (1,000 mg total) by mouth daily with breakfast. 90 tablet 0  . simvastatin (ZOCOR) 20 MG tablet Take 1 tablet (20 mg total) by mouth at bedtime. 90 tablet 3  . TRUEPLUS LANCETS 33G MISC USE TO CHECK BLOOD SUGAR DAILY AND PRN 300 each 3   No current facility-administered medications on file prior to visit.    BP 140/66 mmHg  Pulse 76  Temp(Src) 97.7 F (36.5 C) (Oral)  Resp 20  Ht 5' 7.5" (1.715 m)  Wt 172 lb (78.019 kg)  BMI 26.53 kg/m2  SpO2 96%       Review of Systems  Constitutional: Negative for fever, chills, activity change, appetite change and fatigue.  HENT: Negative for congestion, dental problem, ear pain, hearing loss, mouth sores, rhinorrhea, sinus pressure, sneezing, tinnitus, trouble swallowing and voice change.   Eyes: Negative for photophobia, pain, redness and visual disturbance.  Respiratory: Negative for apnea, cough, choking, chest tightness, shortness of breath and wheezing.   Cardiovascular: Negative for chest pain, palpitations and leg swelling.  Gastrointestinal: Negative for nausea, vomiting, abdominal pain, diarrhea, constipation, blood in stool, abdominal distention, anal bleeding and rectal pain.  Genitourinary: Negative for dysuria, urgency, frequency, hematuria, flank pain, decreased urine volume, discharge, penile swelling, scrotal swelling, difficulty urinating, genital sores and testicular pain.  Musculoskeletal: Negative for myalgias, back pain, joint swelling, arthralgias, gait problem, neck pain and neck stiffness.       Mild bilateral hip pain that is improving  Skin: Negative for color change, rash and wound.  Neurological: Negative for dizziness, tremors, seizures, syncope, facial asymmetry, speech difficulty, weakness, light-headedness, numbness and headaches.   Hematological: Negative for adenopathy. Does not bruise/bleed easily.  Psychiatric/Behavioral: Negative for suicidal ideas, hallucinations, behavioral problems, confusion, sleep disturbance, self-injury, dysphoric mood, decreased concentration and agitation. The patient is not nervous/anxious.        Objective:   Physical Exam  Constitutional: He appears well-developed and well-nourished.  Blood pressure 140/80  HENT:  Head: Normocephalic and atraumatic.  Right Ear: External ear normal.  Left Ear: External ear normal.  Nose: Nose normal.  Mouth/Throat: Oropharynx is clear and moist.  Eyes: Conjunctivae and EOM are normal. Pupils are equal, round, and reactive to light. No scleral icterus.  Neck: Normal range of motion. Neck supple. No JVD present. No thyromegaly present.  Cardiovascular: Regular rhythm, normal heart sounds and intact distal pulses.  Exam reveals no gallop and no friction rub.   No murmur heard. Pulmonary/Chest: Effort normal and breath sounds normal. He exhibits no tenderness.  Abdominal: Soft. Bowel sounds are normal. He exhibits no distension and no mass. There is no tenderness.  Genitourinary: Prostate normal and penis normal. Guaiac negative stool.  Plus 3.  Enlarged, smooth and symmetrical.  Stool hematest negative  Musculoskeletal: Normal range of motion. He exhibits no edema or tenderness.  Lymphadenopathy:    He has no cervical adenopathy.  Neurological: He is alert. He has normal reflexes. No cranial nerve deficit. Coordination normal.  Skin: Skin is warm and dry. No rash noted.  Psychiatric: He has a normal mood and affect. His behavior is normal.          Assessment & Plan:   Preventive health exam Hypertension.  Well controlled Dyslipidemia .  Continue statin therapy Diabetes mellitus.  Well controlled.  Hemoglobin A1c 6 point 5.  Recheck 6 months  Elevated PSA.  Options discussed including follow-up or urological referral  No change in  medical regimen  Preventive health.  Flu vaccine in Pneumovax administered

## 2015-05-21 DIAGNOSIS — H2513 Age-related nuclear cataract, bilateral: Secondary | ICD-10-CM | POA: Diagnosis not present

## 2015-05-21 DIAGNOSIS — E119 Type 2 diabetes mellitus without complications: Secondary | ICD-10-CM | POA: Diagnosis not present

## 2015-05-21 DIAGNOSIS — H5201 Hypermetropia, right eye: Secondary | ICD-10-CM | POA: Diagnosis not present

## 2015-05-21 DIAGNOSIS — H5212 Myopia, left eye: Secondary | ICD-10-CM | POA: Diagnosis not present

## 2015-05-21 DIAGNOSIS — H52223 Regular astigmatism, bilateral: Secondary | ICD-10-CM | POA: Diagnosis not present

## 2015-05-21 DIAGNOSIS — Z7984 Long term (current) use of oral hypoglycemic drugs: Secondary | ICD-10-CM | POA: Diagnosis not present

## 2015-05-21 LAB — HM DIABETES EYE EXAM

## 2015-06-04 ENCOUNTER — Encounter: Payer: Self-pay | Admitting: Internal Medicine

## 2015-06-06 ENCOUNTER — Other Ambulatory Visit: Payer: Self-pay | Admitting: Internal Medicine

## 2015-06-16 ENCOUNTER — Encounter: Payer: Self-pay | Admitting: Internal Medicine

## 2015-06-18 ENCOUNTER — Other Ambulatory Visit: Payer: Self-pay | Admitting: Internal Medicine

## 2015-06-18 MED ORDER — LISINOPRIL-HYDROCHLOROTHIAZIDE 20-12.5 MG PO TABS: 1.0000 | ORAL_TABLET | Freq: Every day | ORAL | Status: DC

## 2015-06-22 MED ORDER — METFORMIN HCL 1000 MG PO TABS: 1000.0000 mg | ORAL_TABLET | Freq: Every day | ORAL | Status: DC

## 2015-06-22 MED ORDER — SIMVASTATIN 20 MG PO TABS: 20.0000 mg | ORAL_TABLET | Freq: Every day | ORAL | Status: DC

## 2015-06-22 MED ORDER — LISINOPRIL-HYDROCHLOROTHIAZIDE 20-12.5 MG PO TABS: 1.0000 | ORAL_TABLET | Freq: Every day | ORAL | Status: DC

## 2015-07-02 ENCOUNTER — Encounter: Payer: Self-pay | Admitting: Internal Medicine

## 2015-07-02 MED ORDER — METFORMIN HCL 1000 MG PO TABS: 1000.0000 mg | ORAL_TABLET | Freq: Every day | ORAL | Status: DC

## 2015-07-02 MED ORDER — SIMVASTATIN 20 MG PO TABS: 20.0000 mg | ORAL_TABLET | Freq: Every day | ORAL | Status: DC

## 2015-07-02 MED ORDER — LISINOPRIL-HYDROCHLOROTHIAZIDE 20-12.5 MG PO TABS: 1.0000 | ORAL_TABLET | Freq: Every day | ORAL | Status: DC

## 2015-07-10 ENCOUNTER — Other Ambulatory Visit: Payer: Self-pay | Admitting: *Deleted

## 2015-07-10 MED ORDER — LISINOPRIL-HYDROCHLOROTHIAZIDE 20-12.5 MG PO TABS: 1.0000 | ORAL_TABLET | Freq: Every day | ORAL | Status: DC

## 2015-07-10 NOTE — Telephone Encounter (Signed)
Pt notified Rx for Lisinopril/HCTZ sent to pharmacy as requested.

## 2015-07-24 ENCOUNTER — Encounter: Payer: Self-pay | Admitting: Internal Medicine

## 2015-07-24 MED ORDER — METFORMIN HCL 1000 MG PO TABS: 1000.0000 mg | ORAL_TABLET | Freq: Every day | ORAL | Status: DC

## 2015-07-24 MED ORDER — LISINOPRIL-HYDROCHLOROTHIAZIDE 20-12.5 MG PO TABS: 1.0000 | ORAL_TABLET | Freq: Every day | ORAL | Status: DC

## 2015-07-28 ENCOUNTER — Other Ambulatory Visit: Payer: Self-pay | Admitting: Family Medicine

## 2015-09-28 ENCOUNTER — Other Ambulatory Visit: Payer: Medicare PPO

## 2015-10-29 ENCOUNTER — Other Ambulatory Visit (INDEPENDENT_AMBULATORY_CARE_PROVIDER_SITE_OTHER): Payer: Medicare PPO

## 2015-10-29 DIAGNOSIS — E119 Type 2 diabetes mellitus without complications: Secondary | ICD-10-CM

## 2015-10-29 DIAGNOSIS — E785 Hyperlipidemia, unspecified: Secondary | ICD-10-CM | POA: Diagnosis not present

## 2015-10-29 DIAGNOSIS — N400 Enlarged prostate without lower urinary tract symptoms: Secondary | ICD-10-CM | POA: Diagnosis not present

## 2015-10-29 DIAGNOSIS — I10 Essential (primary) hypertension: Secondary | ICD-10-CM

## 2015-10-29 LAB — BASIC METABOLIC PANEL
BUN: 16 mg/dL (ref 6–23)
CALCIUM: 9.9 mg/dL (ref 8.4–10.5)
CO2: 35 mEq/L — ABNORMAL HIGH (ref 19–32)
CREATININE: 1.03 mg/dL (ref 0.40–1.50)
Chloride: 98 mEq/L (ref 96–112)
GFR: 74.8 mL/min (ref >60.00)
Glucose, Bld: 147 mg/dL — ABNORMAL HIGH (ref 70–99)
Potassium: 4.8 mEq/L (ref 3.5–5.1)
Sodium: 139 mEq/L (ref 135–145)

## 2015-10-29 LAB — HEMOGLOBIN A1C: Hgb A1c MFr Bld: 6.6 % — ABNORMAL HIGH (ref 4.6–6.5)

## 2015-10-29 LAB — LIPID PANEL
Cholesterol: 191 mg/dL (ref 0–200)
HDL: 60.7 mg/dL (ref >39.00)
LDL Cholesterol: 108 mg/dL — ABNORMAL HIGH (ref 0–99)
NONHDL: 130.7
Total CHOL/HDL Ratio: 3
Triglycerides: 114 mg/dL (ref 0.0–149.0)
VLDL: 22.8 mg/dL (ref 0.0–40.0)

## 2015-10-29 LAB — PSA: PSA: 4.33 ng/mL — ABNORMAL HIGH (ref 0.10–4.00)

## 2015-11-03 ENCOUNTER — Encounter: Payer: Self-pay | Admitting: Internal Medicine

## 2015-11-03 ENCOUNTER — Ambulatory Visit (INDEPENDENT_AMBULATORY_CARE_PROVIDER_SITE_OTHER): Payer: Medicare PPO | Admitting: Internal Medicine

## 2015-11-03 VITALS — BP 136/74 | HR 72 | Temp 97.5°F | Resp 20 | Ht 67.5 in | Wt 173.0 lb

## 2015-11-03 DIAGNOSIS — I10 Essential (primary) hypertension: Secondary | ICD-10-CM | POA: Diagnosis not present

## 2015-11-03 DIAGNOSIS — E1100 Type 2 diabetes mellitus with hyperosmolarity without nonketotic hyperglycemic-hyperosmolar coma (NKHHC): Secondary | ICD-10-CM

## 2015-11-03 NOTE — Progress Notes (Signed)
Subjective:    Patient ID: Randy [name], male    DOB: [birth year], 75 y.o.   MRN: [id number]  HPI  Lab Results  Component Value Date   HGBA1C 6.6* 10/29/2015   Wt Readings from Last 3 Encounters:  11/03/15 173 lb (78.472 kg)  05/08/15 172 lb (78.019 kg)  12/19/13 158 lb (71.49 kg)   75 year old patient who is seen today for his biannual follow-up.  He has a history of type 2 diabetes which has been well-controlled on metformin therapy and without complications.  He has essential hypertension.  No concerns or complaints today. He does have a history of a slightly elevated PSA 6 months ago.  This was repeated last week and was stable.  Past Medical History  Diagnosis Date  . Hypertension   . Diabetes mellitus      Social History   Social History  . Marital Status: Married    Spouse Name: N/A  . Number of Children: N/A  . Years of Education: N/A   Occupational History  . Not on file.   Social History Main Topics  . Smoking status: Former Smoker    Quit date: 07/03/2001  . Smokeless tobacco: Former Systems developer    Quit date: 07/04/1991  . Alcohol Use: 1.8 oz/week    3 Glasses of wine per week     Comment: daily alcohol wine with dinner  . Drug Use: No  . Sexual Activity: Yes   Other Topics Concern  . Not on file   Social History Narrative    Past Surgical History  Procedure Laterality Date  . Esophagogastroduodenoscopy  07/05/2011    Procedure: ESOPHAGOGASTRODUODENOSCOPY (EGD);  Surgeon: Landry Dyke, MD;  Location: [facility] ENDOSCOPY;  Service: Endoscopy;  Laterality: N/A;  . Colonoscopy  07/06/2011    Procedure: COLONOSCOPY;  Surgeon: Winfield Cunas., MD;  Location: [facility] ENDOSCOPY;  Service: Endoscopy;  Laterality: N/A;    No family history on file.  No Known Allergies  Current Outpatient Prescriptions on File Prior to Visit  Medication Sig Dispense Refill  . Blood Glucose Calibration (TRUE METRIX LEVEL 1) LOW SOLN USE TO CHECK METER PRIOR TO TESTING SUGAR 3  each 3  . Blood Glucose Monitoring Suppl (TRUE METRIX AIR GLUCOSE METER) W/DEVICE KIT 1 each by Does not apply route daily. 1 kit 0  . glucose blood test strip USE TO CHECK BLOOD SUGAR DAILY AND PRN 300 each 3  . lisinopril-hydrochlorothiazide (ZESTORETIC) 20-12.5 MG tablet Take 1 tablet by mouth daily. 90 tablet 3  . metFORMIN (GLUCOPHAGE) 1000 MG tablet Take 1 tablet (1,000 mg total) by mouth daily with breakfast. 90 tablet 3  . TRUEPLUS LANCETS 33G MISC USE TO CHECK BLOOD SUGAR DAILY AND PRN 300 each 3   No current facility-administered medications on file prior to visit.    BP 136/74 mmHg  Pulse 72  Temp(Src) 97.5 F (36.4 C) (Oral)  Resp 20  Ht 5' 7.5" (1.715 m)  Wt 173 lb (78.472 kg)  BMI 26.68 kg/m2  SpO2 97%     Review of Systems  Constitutional: Negative for fever, chills, appetite change and fatigue.  HENT: Negative for congestion, dental problem, ear pain, hearing loss, sore throat, tinnitus, trouble swallowing and voice change.   Eyes: Negative for pain, discharge and visual disturbance.  Respiratory: Negative for cough, chest tightness, wheezing and stridor.   Cardiovascular: Negative for chest pain, palpitations and leg swelling.  Gastrointestinal: Negative for nausea, vomiting, abdominal pain, diarrhea, constipation, blood in  stool and abdominal distention.  Genitourinary: Negative for urgency, hematuria, flank pain, discharge, difficulty urinating and genital sores.  Musculoskeletal: Negative for myalgias, back pain, joint swelling, arthralgias, gait problem and neck stiffness.  Skin: Negative for rash.  Neurological: Negative for dizziness, syncope, speech difficulty, weakness, numbness and headaches.  Hematological: Negative for adenopathy. Does not bruise/bleed easily.  Psychiatric/Behavioral: Negative for behavioral problems and dysphoric mood. The patient is not nervous/anxious.        Objective:   Physical Exam  Constitutional: He is oriented to person,  place, and time. He appears well-developed.  HENT:  Head: Normocephalic.  Right Ear: External ear normal.  Left Ear: External ear normal.  Eyes: Conjunctivae and EOM are normal.  Neck: Normal range of motion.  Cardiovascular: Normal rate and normal heart sounds.   Pulmonary/Chest: Breath sounds normal.  Abdominal: Bowel sounds are normal.  Musculoskeletal: Normal range of motion. He exhibits no edema or tenderness.  Neurological: He is alert and oriented to person, place, and time.  Psychiatric: He has a normal mood and affect. His behavior is normal.          Assessment & Plan:   Diabetes mellitus.  Well-controlled.  We'll continue present regimen Essential hypertension, stable.  No change in therapy  CPX 6 months

## 2015-11-03 NOTE — Progress Notes (Signed)
Pre visit review using our clinic review tool, if applicable. No additional management support is needed unless otherwise documented below in the visit note. 

## 2015-11-03 NOTE — Patient Instructions (Signed)
Limit your sodium (Salt) intake    It is important that you exercise regularly, at least 20 minutes 3 to 4 times per week.  If you develop chest pain or shortness of breath seek  medical attention.   Please check your hemoglobin A1c every 6  months  

## 2015-11-04 ENCOUNTER — Ambulatory Visit: Payer: Medicare PPO | Admitting: Internal Medicine

## 2016-02-10 ENCOUNTER — Encounter: Payer: Self-pay | Admitting: Internal Medicine

## 2016-04-20 ENCOUNTER — Ambulatory Visit (INDEPENDENT_AMBULATORY_CARE_PROVIDER_SITE_OTHER): Payer: Medicare PPO | Admitting: Internal Medicine

## 2016-04-20 DIAGNOSIS — Z23 Encounter for immunization: Secondary | ICD-10-CM | POA: Diagnosis not present

## 2016-07-06 ENCOUNTER — Other Ambulatory Visit: Payer: Self-pay | Admitting: Internal Medicine

## 2016-07-06 ENCOUNTER — Encounter: Payer: Self-pay | Admitting: Internal Medicine

## 2016-07-06 MED ORDER — METFORMIN HCL 1000 MG PO TABS: 1000.0000 mg | ORAL_TABLET | Freq: Every day | ORAL | 0 refills | Status: DC

## 2016-07-06 MED ORDER — TRUEPLUS LANCETS 33G MISC: 0 refills | Status: DC

## 2016-07-06 MED ORDER — GLUCOSE BLOOD VI STRP: ORAL_STRIP | 0 refills | Status: DC

## 2016-07-06 MED ORDER — LISINOPRIL-HYDROCHLOROTHIAZIDE 20-12.5 MG PO TABS: 1.0000 | ORAL_TABLET | Freq: Every day | ORAL | 0 refills | Status: DC

## 2016-07-06 MED ORDER — TRUE METRIX LEVEL 1 LOW VI SOLN: 0 refills | Status: DC

## 2016-07-06 MED ORDER — TRUE METRIX AIR GLUCOSE METER W/DEVICE KIT
1.0000 | PACK | Freq: Every day | 0 refills | Status: DC
Start: 2016-07-06 — End: 2017-11-09

## 2016-07-06 NOTE — Telephone Encounter (Signed)
Pt scheduled  

## 2016-07-08 ENCOUNTER — Encounter: Payer: Self-pay | Admitting: Internal Medicine

## 2016-07-14 ENCOUNTER — Other Ambulatory Visit: Payer: Self-pay | Admitting: Internal Medicine

## 2016-07-28 ENCOUNTER — Other Ambulatory Visit (INDEPENDENT_AMBULATORY_CARE_PROVIDER_SITE_OTHER): Payer: Medicare PPO

## 2016-07-28 DIAGNOSIS — E785 Hyperlipidemia, unspecified: Secondary | ICD-10-CM

## 2016-07-28 DIAGNOSIS — I10 Essential (primary) hypertension: Secondary | ICD-10-CM | POA: Diagnosis not present

## 2016-07-28 DIAGNOSIS — D62 Acute posthemorrhagic anemia: Secondary | ICD-10-CM | POA: Diagnosis not present

## 2016-07-28 DIAGNOSIS — E119 Type 2 diabetes mellitus without complications: Secondary | ICD-10-CM | POA: Diagnosis not present

## 2016-07-28 DIAGNOSIS — C61 Malignant neoplasm of prostate: Secondary | ICD-10-CM

## 2016-07-28 LAB — LIPID PANEL
Cholesterol: 188 mg/dL (ref 0–200)
HDL: 59.6 mg/dL (ref >39.00)
LDL Cholesterol: 114 mg/dL — ABNORMAL HIGH (ref 0–99)
NONHDL: 128.18
TRIGLYCERIDES: 71 mg/dL (ref 0.0–149.0)
Total CHOL/HDL Ratio: 3
VLDL: 14.2 mg/dL (ref 0.0–40.0)

## 2016-07-28 LAB — CBC WITH DIFFERENTIAL/PLATELET
BASOS ABS: 0 10*3/uL (ref 0.0–0.1)
BASOS PCT: 0.2 % (ref 0.0–3.0)
EOS ABS: 0.3 10*3/uL (ref 0.0–0.7)
Eosinophils Relative: 4.9 % (ref 0.0–5.0)
HEMATOCRIT: 45.7 % (ref 39.0–52.0)
Hemoglobin: 15.2 g/dL (ref 13.0–17.0)
LYMPHS PCT: 29.1 % (ref 12.0–46.0)
Lymphs Abs: 1.8 10*3/uL (ref 0.7–4.0)
MCHC: 33.2 g/dL (ref 30.0–36.0)
MCV: 96.9 fl (ref 78.0–100.0)
MONOS PCT: 7.6 % (ref 3.0–12.0)
Monocytes Absolute: 0.5 10*3/uL (ref 0.1–1.0)
NEUTROS ABS: 3.6 10*3/uL (ref 1.4–7.7)
Neutrophils Relative %: 58.2 % (ref 43.0–77.0)
Platelets: 210 10*3/uL (ref 150.0–400.0)
RBC: 4.72 Mil/uL (ref 4.22–5.81)
RDW: 13.5 % (ref 11.5–15.5)
WBC: 6.2 10*3/uL (ref 4.0–10.5)

## 2016-07-28 LAB — HEMOGLOBIN A1C: Hgb A1c MFr Bld: 6.7 % — ABNORMAL HIGH (ref 4.6–6.5)

## 2016-07-28 LAB — MICROALBUMIN / CREATININE URINE RATIO
CREATININE, U: 174 mg/dL
MICROALB UR: 3.1 mg/dL — AB (ref 0.0–1.9)
MICROALB/CREAT RATIO: 1.8 mg/g (ref 0.0–30.0)

## 2016-07-28 LAB — POC URINALSYSI DIPSTICK (AUTOMATED)
BILIRUBIN UA: NEGATIVE
GLUCOSE UA: NEGATIVE
Ketones, UA: NEGATIVE
LEUKOCYTES UA: NEGATIVE
NITRITE UA: NEGATIVE
Protein, UA: NEGATIVE
RBC UA: NEGATIVE
Spec Grav, UA: 1.025
UROBILINOGEN UA: 0.2
pH, UA: 5.5

## 2016-07-28 LAB — BASIC METABOLIC PANEL
BUN: 13 mg/dL (ref 6–23)
CALCIUM: 10 mg/dL (ref 8.4–10.5)
CHLORIDE: 99 meq/L (ref 96–112)
CO2: 36 meq/L — AB (ref 19–32)
CREATININE: 1.01 mg/dL (ref 0.40–1.50)
GFR: 76.36 mL/min (ref >60.00)
Glucose, Bld: 131 mg/dL — ABNORMAL HIGH (ref 70–99)
Potassium: 4.9 mEq/L (ref 3.5–5.1)
SODIUM: 141 meq/L (ref 135–145)

## 2016-07-28 LAB — HEPATIC FUNCTION PANEL
ALBUMIN: 4.2 g/dL (ref 3.5–5.2)
ALK PHOS: 49 U/L (ref 39–117)
ALT: 13 U/L (ref 0–53)
AST: 12 U/L (ref 0–37)
Bilirubin, Direct: 0.1 mg/dL (ref 0.0–0.3)
TOTAL PROTEIN: 6.4 g/dL (ref 6.0–8.3)
Total Bilirubin: 0.4 mg/dL (ref 0.2–1.2)

## 2016-07-28 LAB — TSH: TSH: 1.59 u[IU]/mL (ref 0.35–4.50)

## 2016-08-04 ENCOUNTER — Ambulatory Visit (INDEPENDENT_AMBULATORY_CARE_PROVIDER_SITE_OTHER): Payer: Medicare PPO | Admitting: Internal Medicine

## 2016-08-04 ENCOUNTER — Encounter: Payer: Self-pay | Admitting: Internal Medicine

## 2016-08-04 VITALS — BP 110/60 | HR 60 | Temp 97.5°F | Ht 67.0 in | Wt 169.0 lb

## 2016-08-04 DIAGNOSIS — Z0000 Encounter for general adult medical examination without abnormal findings: Secondary | ICD-10-CM

## 2016-08-04 NOTE — Progress Notes (Signed)
Subjective:    Patient ID: Randy [name], male    DOB: 02/21/1941, 76 y.o.   MRN: [id number]  HPI 76 year old patient who is seen today for a preventive health examination as well as an annual Medicare wellness visit.  He has a history of essential hypertension as well as type 2 diabetes, control with metformin therapy. No statin therapy.  He has no concerns today including tenderness.  He did take more daytime naps.  For the past few months.  She has had some mild dyspnea on exertion but nothing too severe.  He complains of some shoulder discomfort poor night vision and slight diminished auditory acuity  Past Medical History:  Diagnosis Date  . Diabetes mellitus   . Hypertension      Social History   Social History  . Marital status: Married    Spouse name: N/A  . Number of children: N/A  . Years of education: N/A   Occupational History  . Not on file.   Social History Main Topics  . Smoking status: Former Smoker    Quit date: 07/03/2001  . Smokeless tobacco: Former Systems developer    Quit date: 07/04/1991  . Alcohol use 1.8 oz/week    3 Glasses of wine per week     Comment: daily alcohol wine with dinner  . Drug use: No  . Sexual activity: Yes   Other Topics Concern  . Not on file   Social History Narrative  . No narrative on file    Past Surgical History:  Procedure Laterality Date  . COLONOSCOPY  07/06/2011   Procedure: COLONOSCOPY;  Surgeon: Winfield Cunas., MD;  Location: [facility] ENDOSCOPY;  Service: Endoscopy;  Laterality: N/A;  . ESOPHAGOGASTRODUODENOSCOPY  07/05/2011   Procedure: ESOPHAGOGASTRODUODENOSCOPY (EGD);  Surgeon: Landry Dyke, MD;  Location[facility]OSCOPY;  Service: Endoscopy;  Laterality: N/A;    History reviewed. No pertinent family history.  No Known Allergies  Current Outpatient Prescriptions on File Prior to Visit  Medication Sig Dispense Refill  . Blood Glucose Calibration (TRUE METRIX LEVEL 1) Low SOLN USE TO CHECK METER PRIOR TO TESTING  SUGAR 3 each 0  . Blood Glucose Monitoring Suppl (TRUE METRIX AIR GLUCOSE METER) w/Device KIT 1 each by Does not apply route daily. 1 kit 0  . glucose blood test strip USE TO CHECK BLOOD SUGAR DAILY AND PRN 300 each 0  . lisinopril-hydrochlorothiazide (ZESTORETIC) 20-12.5 MG tablet Take 1 tablet by mouth daily. 90 tablet 0  . metFORMIN (GLUCOPHAGE) 1000 MG tablet TAKE ONE TABLET BY MOUTH DAILY WITH BREAKFAST 90 tablet 1  . TRUEPLUS LANCETS 33G MISC USE TO CHECK BLOOD SUGAR DAILY AND PRN 300 each 0   No current facility-administered medications on file prior to visit.     BP 110/60 (BP Location: Left Arm, Patient Position: Sitting, Cuff Size: Normal)   Pulse 60   Temp 97.5 F (36.4 C) (Oral)   Ht 5' 7"  (1.702 m)   Wt 169 lb (76.7 kg)   BMI 26.47 kg/m      Review of Systems  Constitutional: Negative for appetite change, chills, fatigue and fever.  HENT: Positive for hearing loss. Negative for congestion, dental problem, ear pain, sore throat, tinnitus, trouble swallowing and voice change.   Eyes: Positive for visual disturbance. Negative for pain and discharge.  Respiratory: Positive for shortness of breath. Negative for cough, chest tightness, wheezing and stridor.   Cardiovascular: Negative for chest pain, palpitations and leg swelling.  Gastrointestinal: Negative for  abdominal distention, abdominal pain, blood in stool, constipation, diarrhea, nausea and vomiting.       Occasional urgency and fecal incontinence  Genitourinary: Negative for difficulty urinating, discharge, flank pain, genital sores, hematuria and urgency.  Musculoskeletal: Positive for arthralgias. Negative for back pain, gait problem, joint swelling, myalgias and neck stiffness.  Skin: Negative for rash.  Neurological: Negative for dizziness, syncope, speech difficulty, weakness, numbness and headaches.  Hematological: Negative for adenopathy. Does not bruise/bleed easily.  Psychiatric/Behavioral: Positive for  sleep disturbance. Negative for behavioral problems and dysphoric mood. The patient is not nervous/anxious.    Medicare wellness visit  1. Risk factors, based on past  M,S,F history.  Cardiovascular risk factors include hypertension and type 2 diabetes.  2.  Physical activities: fairly active but no exercise regimen.  3.  Depression/mood:no history of major depression or mood disorder  4.  Hearing:mild mild deficits  5.  ADL's:independent  6.  Fall risk:low  7.  Home safety:no problems identified  8.  Height weight, and visual acuity;height and weight stable.  Does have annual eye examination.  Complains of some decreased visual acuity at night time  9.  Counseling:heart healthy diet more regular exercise encouraged  10. Lab orders based on risk factors:laboratory profile including hemoglobin A1c reviewed  11. Referral :patient will consider referral for audiology  12. Care plan:continue efforts at risk factor modification.  Statin therapy discussed  13. Cognitive assessment: alert and oriented with normal affect no cognitive dysfunction  14. Screening: Patient provided with a written and personalized 5-10 year screening schedule in the AVS.    15. Provider List Update: primary care medicine ophthalmology     Objective:   Physical Exam  Constitutional: He appears well-developed and well-nourished.  Blood pressure 110/70  HENT:  Head: Normocephalic and atraumatic.  Right Ear: External ear normal.  Left Ear: External ear normal.  Nose: Nose normal.  Mouth/Throat: Oropharynx is clear and moist.  Eyes: Conjunctivae and EOM are normal. Pupils are equal, round, and reactive to light. No scleral icterus.  Neck: Normal range of motion. Neck supple. No JVD present. No thyromegaly present.  Cardiovascular: Regular rhythm, normal heart sounds and intact distal pulses.  Exam reveals no gallop and no friction rub.   No murmur heard. Pulmonary/Chest: Effort normal and breath sounds  normal. He exhibits no tenderness.  Abdominal: Soft. Bowel sounds are normal. He exhibits no distension and no mass. There is no tenderness.  Genitourinary: Penis normal. Rectal exam shows guaiac negative stool.  Genitourinary Comments: Plus 2 symmetrically enlarged.  Musculoskeletal: Normal range of motion. He exhibits no edema or tenderness.  Slight edema left medial ankle  Lymphadenopathy:    He has no cervical adenopathy.  Neurological: He is alert. He has normal reflexes. No cranial nerve deficit. Coordination normal.  Skin: Skin is warm and dry. No rash noted.  Dry flaky skin of the distal legs Onychomycotic toenail changes  Psychiatric: He has a normal mood and affect. His behavior is normal.          Assessment & Plan:   Preventive health examination Medicare wellness visit Diabetes mellitus stable.  No change in therapy.  Statin therapy discussed Essential hypertension, well-controlled.  No change in therapy  Laboratory studies reviewed and discussed Follow-up 6 months  [id number],PETER Pilar Plate

## 2016-08-04 NOTE — Patient Instructions (Addendum)
Limit your sodium (Salt) intake   Please check your hemoglobin A1c every 3-6 months    It is important that you exercise regularly, at least 20 minutes 3 to 4 times per week.  If you develop chest pain or shortness of breath seek  medical attention.  Please check your blood pressure on a regular basis.  If it is consistently greater than 150/90, please make an office appointment.   Health Maintenance, Male A healthy lifestyle and preventative care can promote health and wellness.  Maintain regular health, dental, and eye exams.  Eat a healthy diet. Foods like vegetables, fruits, whole grains, low-fat dairy products, and lean protein foods contain the nutrients you need and are low in calories. Decrease your intake of foods high in solid fats, added sugars, and salt. Get information about a proper diet from your health care provider, if necessary.  Regular physical exercise is one of the most important things you can do for your health. Most adults should get at least 150 minutes of moderate-intensity exercise (any activity that increases your heart rate and causes you to sweat) each week. In addition, most adults need muscle-strengthening exercises on 2 or more days a week.   Maintain a healthy weight. The body mass index (BMI) is a screening tool to identify possible weight problems. It provides an estimate of body fat based on height and weight. Your health care provider can find your BMI and can help you achieve or maintain a healthy weight. For males 20 years and older:  A BMI below 18.5 is considered underweight.  A BMI of 18.5 to 24.9 is normal.  A BMI of 25 to 29.9 is considered overweight.  A BMI of 30 and above is considered obese.  Maintain normal blood lipids and cholesterol by exercising and minimizing your intake of saturated fat. Eat a balanced diet with plenty of fruits and vegetables. Blood tests for lipids and cholesterol should begin at age 66 and be repeated every 5  years. If your lipid or cholesterol levels are high, you are over age 78, or you are at high risk for heart disease, you may need your cholesterol levels checked more frequently.Ongoing high lipid and cholesterol levels should be treated with medicines if diet and exercise are not working.  If you smoke, find out from your health care provider how to quit. If you do not use tobacco, do not start.  Lung cancer screening is recommended for adults aged 36-80 years who are at high risk for developing lung cancer because of a history of smoking. A yearly low-dose CT scan of the lungs is recommended for people who have at least a 30-pack-year history of smoking and are current smokers or have quit within the past 15 years. A pack year of smoking is smoking an average of 1 pack of cigarettes a day for 1 year (for example, a 30-pack-year history of smoking could mean smoking 1 pack a day for 30 years or 2 packs a day for 15 years). Yearly screening should continue until the smoker has stopped smoking for at least 15 years. Yearly screening should be stopped for people who develop a health problem that would prevent them from having lung cancer treatment.  If you choose to drink alcohol, do not have more than 2 drinks per day. One drink is considered to be 12 oz (360 mL) of beer, 5 oz (150 mL) of wine, or 1.5 oz (45 mL) of liquor.  Avoid the use of street  drugs. Do not share needles with anyone. Ask for help if you need support or instructions about stopping the use of drugs.  High blood pressure causes heart disease and increases the risk of stroke. High blood pressure is more likely to develop in:  People who have blood pressure in the end of the normal range (100-139/85-89 mm Hg).  People who are overweight or obese.  People who are African American.  If you are 48-46 years of age, have your blood pressure checked every 3-5 years. If you are 71 years of age or older, have your blood pressure checked  every year. You should have your blood pressure measured twice-once when you are at a hospital or clinic, and once when you are not at a hospital or clinic. Record the average of the two measurements. To check your blood pressure when you are not at a hospital or clinic, you can use:  An automated blood pressure machine at a pharmacy.  A home blood pressure monitor.  If you are 69-20 years old, ask your health care provider if you should take aspirin to prevent heart disease.  Diabetes screening involves taking a blood sample to check your fasting blood sugar level. This should be done once every 3 years after age 53 if you are at a normal weight and without risk factors for diabetes. Testing should be considered at a younger age or be carried out more frequently if you are overweight and have at least 1 risk factor for diabetes.  Colorectal cancer can be detected and often prevented. Most routine colorectal cancer screening begins at the age of 38 and continues through age 29. However, your health care provider may recommend screening at an earlier age if you have risk factors for colon cancer. On a yearly basis, your health care provider may provide home test kits to check for hidden blood in the stool. A small camera at the end of a tube may be used to directly examine the colon (sigmoidoscopy or colonoscopy) to detect the earliest forms of colorectal cancer. Talk to your health care provider about this at age 84 when routine screening begins. A direct exam of the colon should be repeated every 5-10 years through age 53, unless early forms of precancerous polyps or small growths are found.  People who are at an increased risk for hepatitis B should be screened for this virus. You are considered at high risk for hepatitis B if:  You were born in a country where hepatitis B occurs often. Talk with your health care provider about which countries are considered high risk.  Your parents were born in a  high-risk country and you have not received a shot to protect against hepatitis B (hepatitis B vaccine).  You have HIV or AIDS.  You use needles to inject street drugs.  You live with, or have sex with, someone who has hepatitis B.  You are a man who has sex with other men (MSM).  You get hemodialysis treatment.  You take certain medicines for conditions like cancer, organ transplantation, and autoimmune conditions.  Hepatitis C blood testing is recommended for all people born from [birth year] through 1965 and any individual with known risk factors for hepatitis C.  Healthy men should no longer receive prostate-specific antigen (PSA) blood tests as part of routine cancer screening. Talk to your health care provider about prostate cancer screening.  Testicular cancer screening is not recommended for adolescents or adult males who have no symptoms. Screening includes  self-exam, a health care provider exam, and other screening tests. Consult with your health care provider about any symptoms you have or any concerns you have about testicular cancer.  Practice safe sex. Use condoms and avoid high-risk sexual practices to reduce the spread of sexually transmitted infections (STIs).  You should be screened for STIs, including gonorrhea and chlamydia if:  You are sexually active and are younger than 24 years.  You are older than 24 years, and your health care provider tells you that you are at risk for this type of infection.  Your sexual activity has changed since you were last screened, and you are at an increased risk for chlamydia or gonorrhea. Ask your health care provider if you are at risk.  If you are at risk of being infected with HIV, it is recommended that you take a prescription medicine daily to prevent HIV infection. This is called pre-exposure prophylaxis (PrEP). You are considered at risk if:  You are a man who has sex with other men (MSM).  You are a heterosexual man who is  sexually active with multiple partners.  You take drugs by injection.  You are sexually active with a partner who has HIV.  Talk with your health care provider about whether you are at high risk of being infected with HIV. If you choose to begin PrEP, you should first be tested for HIV. You should then be tested every 3 months for as long as you are taking PrEP.  Use sunscreen. Apply sunscreen liberally and repeatedly throughout the day. You should seek shade when your shadow is shorter than you. Protect yourself by wearing long sleeves, pants, a wide-brimmed hat, and sunglasses year round whenever you are outdoors.  Tell your health care provider of new moles or changes in moles, especially if there is a change in shape or color. Also, tell your health care provider if a mole is larger than the size of a pencil eraser.  A one-time screening for abdominal aortic aneurysm (AAA) and surgical repair of large AAAs by ultrasound is recommended for men aged 40-75 years who are current or former smokers.  Stay current with your vaccines (immunizations). This information is not intended to replace advice given to you by your health care provider. Make sure you discuss any questions you have with your health care provider. Document Released: 12/17/2007 Document Revised: 07/11/2014 Document Reviewed: 03/24/2015 Elsevier Interactive Patient Education  2017 [employer].

## 2016-08-04 NOTE — Progress Notes (Signed)
Pre visit review using our clinic review tool, if applicable. No additional management support is needed unless otherwise documented below in the visit note. 

## 2016-10-21 ENCOUNTER — Encounter: Payer: Self-pay | Admitting: Internal Medicine

## 2016-10-21 ENCOUNTER — Other Ambulatory Visit: Payer: Self-pay | Admitting: Internal Medicine

## 2016-10-21 DIAGNOSIS — G4733 Obstructive sleep apnea (adult) (pediatric): Secondary | ICD-10-CM

## 2016-10-27 ENCOUNTER — Encounter: Payer: Self-pay | Admitting: Pulmonary Disease

## 2016-10-27 ENCOUNTER — Ambulatory Visit (INDEPENDENT_AMBULATORY_CARE_PROVIDER_SITE_OTHER): Payer: Medicare PPO | Admitting: Pulmonary Disease

## 2016-10-27 VITALS — BP 142/80 | HR 72 | Ht 67.0 in | Wt 170.0 lb

## 2016-10-27 DIAGNOSIS — R0683 Snoring: Secondary | ICD-10-CM | POA: Diagnosis not present

## 2016-10-27 DIAGNOSIS — G4719 Other hypersomnia: Secondary | ICD-10-CM

## 2016-10-27 NOTE — Progress Notes (Signed)
Past Surgical History He  has a past surgical history that includes Esophagogastroduodenoscopy (07/05/2011) and Colonoscopy (07/06/2011).  No Known Allergies  Family History His family history includes Brain cancer in his mother.  Social History He  reports that he quit smoking about 15 years ago. His smoking use included Cigarettes. He has a 30.00 pack-year smoking history. He quit smokeless tobacco use about 25 years ago. His smokeless tobacco use included Chew. He reports that he drinks about 1.8 oz of alcohol per week . He reports that he does not use drugs.  Review of systems Constitutional: Negative for fever and unexpected weight change.  HENT: Negative for congestion, dental problem, ear pain, nosebleeds, postnasal drip, rhinorrhea, sinus pressure, sneezing, sore throat and trouble swallowing.        Allergies  Eyes: Negative for redness and itching.  Respiratory: Positive for cough and shortness of breath. Negative for chest tightness and wheezing.   Cardiovascular: Negative for palpitations and leg swelling.  Gastrointestinal: Negative for nausea and vomiting.  Genitourinary: Negative for dysuria.  Musculoskeletal: Negative for joint swelling.  Skin: Negative for rash.  Neurological: Negative for headaches.  Hematological: Does not bruise/bleed easily.  Psychiatric/Behavioral: Negative for dysphoric mood. The patient is not nervous/anxious.     Current Outpatient Prescriptions on File Prior to Visit  Medication Sig  . Blood Glucose Calibration (TRUE METRIX LEVEL 1) Low SOLN USE TO CHECK METER PRIOR TO TESTING SUGAR  . Blood Glucose Monitoring Suppl (TRUE METRIX AIR GLUCOSE METER) w/Device KIT 1 each by Does not apply route daily.  Marland Kitchen glucose blood test strip USE TO CHECK BLOOD SUGAR DAILY AND PRN  . lisinopril-hydrochlorothiazide (ZESTORETIC) 20-12.5 MG tablet Take 1 tablet by mouth daily.  . metFORMIN (GLUCOPHAGE) 1000 MG tablet TAKE ONE TABLET BY MOUTH DAILY WITH BREAKFAST  (Patient taking differently: TAKE ONE TABLET BY MOUTH DAILY AT BEDTIME)  . TRUEPLUS LANCETS 33G MISC USE TO CHECK BLOOD SUGAR DAILY AND PRN   No current facility-administered medications on file prior to visit.     Chief Complaint  Patient presents with  . SLEEP CONSULT    Refered by Dr Burnice Logan. Epworth Score: 17    Past medical history He  has a past medical history of Diabetes mellitus and Hypertension.  Vital signs BP (!) 142/80 (BP Location: Left Arm, Cuff Size: Normal)   Pulse 72   Ht 5' 7"  (1.702 m)   Wt 170 lb (77.1 kg)   SpO2 91%   BMI 26.63 kg/m   History of Present Illness Randy [name] is a 76 y.o. male for evaluation of sleep problems.  He has noticed feeling more sleepy during the day.  He falls asleep whenever he is sitting still.  His wife has told him he snores some, and will get shallow breathing.  He tends to dream a lot, and his mouth gets dry at night.  He goes to sleep at 10 pm.  He falls asleep within minutes.  He wakes up 2 times to use the bathroom.  He gets out of bed at 7 am.  He feels okay in the morning, but gets sleepy as the day goes on.  He denies morning headache.  He does not use anything to help him fall sleep.  He drinks coffee in the morning.  He denies sleep walking, sleep talking, bruxism, or nightmares.  There is no history of restless legs.  He denies sleep hallucinations, sleep paralysis, or cataplexy.  The Epworth score is 17 out of  60.  His brother has sleep apnea, and wears a machine that helped his sleep.   Physical Exam:  General - No distress Eyes - wears glasses ENT - No sinus tenderness, no oral exudate, no LAN, no thyromegaly, TM clear, pupils equal/reactive, MP 3 Cardiac - s1s2 regular, no murmur, pulses symmetric Chest - No wheeze/rales/dullness, good air entry, normal respiratory excursion Back - No focal tenderness Abd - Soft, non-tender, no organomegaly, + bowel sounds Ext - No edema Neuro - Normal strength,  cranial nerves intact Skin - No rashes Psych - Normal mood, and behavior  Discussion: He has snoring, witnessed apnea, sleep disruption, and daytime sleepiness.  He has hx of hypertension and diabetes.  I am concerned he could have sleep apnea.  We discussed how sleep apnea can affect various health problems, including risks for hypertension, cardiovascular disease, and diabetes.  We also discussed how sleep disruption can increase risks for accidents, such as while driving.  Weight loss as a means of improving sleep apnea was also reviewed.  Additional treatment options discussed were CPAP therapy, oral appliance, and surgical intervention.   Assessment/plan:  Snoring with concern for obstructive sleep apnea. - will arrange for home sleep study   Patient Instructions  Will arrange for home sleep study Will call to arrange for follow up after sleep study reviewed    Chesley Mires, M.D. Pager [phone] 10/27/2016, 10:53 AM

## 2016-10-27 NOTE — Patient Instructions (Signed)
Will arrange for home sleep study Will call to arrange for follow up after sleep study reviewed  

## 2016-10-27 NOTE — Progress Notes (Signed)
   Subjective:    Patient ID: Randy [name], male    DOB: September 22, 1940, 76 y.o.   MRN: [id number]  HPI    Review of Systems  Constitutional: Negative for fever and unexpected weight change.  HENT: Negative for congestion, dental problem, ear pain, nosebleeds, postnasal drip, rhinorrhea, sinus pressure, sneezing, sore throat and trouble swallowing.        Allergies  Eyes: Negative for redness and itching.  Respiratory: Positive for cough and shortness of breath. Negative for chest tightness and wheezing.   Cardiovascular: Negative for palpitations and leg swelling.  Gastrointestinal: Negative for nausea and vomiting.  Genitourinary: Negative for dysuria.  Musculoskeletal: Negative for joint swelling.  Skin: Negative for rash.  Neurological: Negative for headaches.  Hematological: Does not bruise/bleed easily.  Psychiatric/Behavioral: Negative for dysphoric mood. The patient is not nervous/anxious.        Objective:   Physical Exam        Assessment & Plan:

## 2016-11-08 ENCOUNTER — Encounter: Payer: Self-pay | Admitting: Internal Medicine

## 2016-11-12 ENCOUNTER — Other Ambulatory Visit: Payer: Self-pay | Admitting: Internal Medicine

## 2016-11-14 ENCOUNTER — Other Ambulatory Visit: Payer: Self-pay

## 2016-11-14 MED ORDER — LISINOPRIL-HYDROCHLOROTHIAZIDE 20-12.5 MG PO TABS: 1.0000 | ORAL_TABLET | Freq: Every day | ORAL | 1 refills | Status: DC

## 2016-11-14 MED ORDER — METFORMIN HCL 1000 MG PO TABS: 1000.0000 mg | ORAL_TABLET | Freq: Every day | ORAL | 1 refills | Status: DC

## 2016-11-16 DIAGNOSIS — H524 Presbyopia: Secondary | ICD-10-CM | POA: Diagnosis not present

## 2016-11-16 DIAGNOSIS — H5212 Myopia, left eye: Secondary | ICD-10-CM | POA: Diagnosis not present

## 2016-11-16 DIAGNOSIS — H5201 Hypermetropia, right eye: Secondary | ICD-10-CM | POA: Diagnosis not present

## 2016-11-16 DIAGNOSIS — E119 Type 2 diabetes mellitus without complications: Secondary | ICD-10-CM | POA: Diagnosis not present

## 2016-11-16 DIAGNOSIS — H52223 Regular astigmatism, bilateral: Secondary | ICD-10-CM | POA: Diagnosis not present

## 2016-11-16 DIAGNOSIS — H2513 Age-related nuclear cataract, bilateral: Secondary | ICD-10-CM | POA: Diagnosis not present

## 2016-11-16 DIAGNOSIS — H35373 Puckering of macula, bilateral: Secondary | ICD-10-CM | POA: Diagnosis not present

## 2016-11-16 LAB — HM DIABETES EYE EXAM

## 2016-11-18 ENCOUNTER — Encounter: Payer: Self-pay | Admitting: Family Medicine

## 2016-11-22 DIAGNOSIS — G4733 Obstructive sleep apnea (adult) (pediatric): Secondary | ICD-10-CM | POA: Diagnosis not present

## 2016-11-24 ENCOUNTER — Encounter: Payer: Self-pay | Admitting: Pulmonary Disease

## 2016-11-24 ENCOUNTER — Telehealth: Payer: Self-pay | Admitting: Pulmonary Disease

## 2016-11-24 DIAGNOSIS — G4733 Obstructive sleep apnea (adult) (pediatric): Secondary | ICD-10-CM

## 2016-11-24 HISTORY — DX: Obstructive sleep apnea (adult) (pediatric): G47.33

## 2016-11-24 NOTE — Telephone Encounter (Signed)
HST 11/22/16 >> AHI 80.7, SaO2 low 52%   Will have my nurse inform pt that sleep study shows severe sleep apnea with low oxygen while asleep.  He needs to be schedule for in lab CPAP titration study.  Please order if he is agreeable to this.

## 2016-11-25 ENCOUNTER — Other Ambulatory Visit: Payer: Self-pay | Admitting: *Deleted

## 2016-11-25 DIAGNOSIS — R0683 Snoring: Secondary | ICD-10-CM

## 2016-11-25 DIAGNOSIS — G4719 Other hypersomnia: Secondary | ICD-10-CM

## 2016-11-25 DIAGNOSIS — G4733 Obstructive sleep apnea (adult) (pediatric): Secondary | ICD-10-CM | POA: Diagnosis not present

## 2016-11-25 NOTE — Telephone Encounter (Signed)
LM x 1 

## 2016-12-05 NOTE — Telephone Encounter (Signed)
Spoke with patient regarding results. Patient verbalized understanding. Wants to proceed with CPAP titration study. Will go ahead and place this order. Nothing further needed at time of call.

## 2016-12-05 NOTE — Telephone Encounter (Signed)
Patient returned phone call, patient contact # [phone]...ert

## 2016-12-21 ENCOUNTER — Ambulatory Visit (HOSPITAL_BASED_OUTPATIENT_CLINIC_OR_DEPARTMENT_OTHER): Payer: Medicare PPO | Attending: Pulmonary Disease | Admitting: Pulmonary Disease

## 2016-12-21 VITALS — Ht 67.0 in | Wt 164.0 lb

## 2016-12-21 DIAGNOSIS — Z7984 Long term (current) use of oral hypoglycemic drugs: Secondary | ICD-10-CM | POA: Diagnosis not present

## 2016-12-21 DIAGNOSIS — G4733 Obstructive sleep apnea (adult) (pediatric): Secondary | ICD-10-CM | POA: Diagnosis not present

## 2016-12-21 DIAGNOSIS — Z79899 Other long term (current) drug therapy: Secondary | ICD-10-CM | POA: Diagnosis not present

## 2016-12-21 DIAGNOSIS — G4736 Sleep related hypoventilation in conditions classified elsewhere: Secondary | ICD-10-CM | POA: Insufficient documentation

## 2016-12-21 DIAGNOSIS — G4761 Periodic limb movement disorder: Secondary | ICD-10-CM | POA: Insufficient documentation

## 2016-12-21 DIAGNOSIS — G4734 Idiopathic sleep related nonobstructive alveolar hypoventilation: Secondary | ICD-10-CM

## 2016-12-22 ENCOUNTER — Telehealth: Payer: Self-pay | Admitting: Pulmonary Disease

## 2016-12-22 NOTE — Telephone Encounter (Signed)
Noted  

## 2016-12-22 NOTE — Telephone Encounter (Signed)
Spoke with pt, who wanted to make VS aware that he will be out of state 12/24/16 to 01/10/17. Pt wanted to make our office aware, in case we called in regards to his sleep study results. Pt states we may leave a message and he will return our call when he returns home. Nothing further is needed.  Will route to VS as an FYI.

## 2017-01-09 ENCOUNTER — Telehealth: Payer: Self-pay | Admitting: Pulmonary Disease

## 2017-01-09 DIAGNOSIS — G4734 Idiopathic sleep related nonobstructive alveolar hypoventilation: Secondary | ICD-10-CM | POA: Insufficient documentation

## 2017-01-09 DIAGNOSIS — G4733 Obstructive sleep apnea (adult) (pediatric): Secondary | ICD-10-CM

## 2017-01-09 DIAGNOSIS — G4761 Periodic limb movement disorder: Secondary | ICD-10-CM | POA: Insufficient documentation

## 2017-01-09 NOTE — Procedures (Signed)
   Patient Name: Randy [name], Randy [name] Date: 12/21/2016 Gender: Male D.O.B: 05/24/1941 Age (years): 63 Referring Provider: Chesley Mires MD, ABSM Height (inches): 67 Interpreting Physician: Chesley Mires MD, ABSM Weight (lbs): 164 RPSGT: Carolin Coy BMI: 26 MRN: [id number] Neck Size: 15.50  CLINICAL INFORMATION The patient is referred for a CPAP titration to treat sleep apnea.  Date of NPSG, Split Night or HST: AHI 80,7, SaO2 low 52% from 11/22/16.  SLEEP STUDY TECHNIQUE As per the AASM Manual for the Scoring of Sleep and Associated Events v2.3 (April 2016) with a hypopnea requiring 4% desaturations.  The channels recorded and monitored were frontal, central and occipital EEG, electrooculogram (EOG), submentalis EMG (chin), nasal and oral airflow, thoracic and abdominal wall motion, anterior tibialis EMG, snore microphone, electrocardiogram, and pulse oximetry. Continuous positive airway pressure (CPAP) was initiated at the beginning of the study and titrated to treat sleep-disordered breathing.  MEDICATIONS Medications self-administered by patient taken the night of the study : METFORMIN HCL, LISINOPRIL/HCTZ  TECHNICIAN COMMENTS Comments added by technician: PATIENT WAS ORDERED AS A CPAP TITRATION.  Comments added by scorer: N/A  RESPIRATORY PARAMETERS Optimal PAP Pressure (cm): 14 AHI at Optimal Pressure (/hr): 0.0 Overall Minimal O2 (%): 62.00 Supine % at Optimal Pressure (%): 100 Minimal O2 at Optimal Pressure (%): 87.0       He had persistent oxygen desaturation in spite of good control of sleep apnea while using CPAP, and this lasted for more than 5 minutes.  He had 2 liters oxygen added to CPAP with improved control of his oxygenation.  SLEEP ARCHITECTURE The study was initiated at 10:03:43 PM and ended at 4:35:59 AM.  Sleep onset time was 17.3 minutes and the sleep efficiency was 86.2%. The total sleep time was 338.0 minutes.  The patient spent 7.10% of the night  in stage N1 sleep, 78.25% in stage N2 sleep, 0.00% in stage N3 and 14.64% in REM.Stage REM latency was 124.5 minutes  Wake after sleep onset was 37.0. Alpha intrusion was absent. Supine sleep was 63.61%.  CARDIAC DATA The 2 lead EKG demonstrated sinus rhythm. The mean heart rate was 73.17 beats per minute. Other EKG findings include: PVCs.  LEG MOVEMENT DATA The total Periodic Limb Movements of Sleep (PLMS) were 113. The PLMS index was 20.06. A PLMS index of <15 is considered normal in adults.  IMPRESSIONS - The optimal PAP pressure was 14 cm of water. - He required the addition of 2 liters oxygen with CPAP to maintain control of his oxygenation. - He had an increase in his periodic limb movement index.  DIAGNOSIS - Obstructive Sleep Apnea (G47.33) - Sleep Related Hypoxia (G47.34) - Periodic Limb Movements of Sleep (G47.61)  RECOMMENDATIONS - Trial of CPAP therapy on 14 cm H2O with 2 liters oxygen at night. - He was fitted a Medium size Resmed Full Face Mask AirFit F10 mask and heated humidification.  [Electronically signed] 01/09/2017 04:35 PM  Chesley Mires MD, [place], American Board of Sleep Medicine   NPI: [id number]

## 2017-01-09 NOTE — Telephone Encounter (Signed)
Spoke with pt, who states he is back from vacation and is requesting sleep study results. Pt request to receive results as soon as possible, as he is having trouble staying awake during the day.  VS please advise. Thanks.

## 2017-01-09 NOTE — Telephone Encounter (Signed)
CPAP titration 12/21/16 >> CPAP 14 cm H2O with 2 liters oxygen.   Please let pt know he did well with CPAP, but also needed supplemental oxygen at night with CPAP to control his sleep apnea and low oxygen at night.  Please arrange for CPAP 14 cm H2O with heated humidity and mask of choice.  He will also need to be set up with 2 liters oxygen at night to be used with CPAP.  Please schedule ROV with me or NP in 2 months after set up.

## 2017-01-09 NOTE — Telephone Encounter (Signed)
Spoke with pt about his results per VS. Pt understood and agreed to the order being placed for cpap and oxygen.These have been placed. Pt wanted to schedule follow up appt, which this has been made. He had no further questions at this time. Nothing further is needed

## 2017-01-11 DIAGNOSIS — G4733 Obstructive sleep apnea (adult) (pediatric): Secondary | ICD-10-CM | POA: Diagnosis not present

## 2017-01-13 ENCOUNTER — Telehealth: Payer: Self-pay | Admitting: Adult Health

## 2017-01-13 NOTE — Telephone Encounter (Signed)
lmomtcb x 1 for the pt 

## 2017-01-16 DIAGNOSIS — G4733 Obstructive sleep apnea (adult) (pediatric): Secondary | ICD-10-CM | POA: Diagnosis not present

## 2017-01-16 NOTE — Telephone Encounter (Signed)
Spoke with pt, states that he has O2 but no cpap yet.  I advised that his DME would be bringing the cpap to him and helping him to set up his equipment.  Provided DME # for pt to follow up on.  Nothing further needed at this time.

## 2017-01-25 ENCOUNTER — Telehealth: Payer: Self-pay | Admitting: Pulmonary Disease

## 2017-01-25 NOTE — Telephone Encounter (Signed)
Spoke with pt, who states he is returning a call from our office that he received on 01/24/17. There is no documentation of contacting pt on 01/24/17 on our end.  I explained to pt that it may had been an old message, as it appears we left a message for pt on 01/13/17 requesting a return call. Pt states he has received his cpap machine and has used it for 3 nights.  Will close encounter, as nothing further is needed at this time.

## 2017-02-11 DIAGNOSIS — G4733 Obstructive sleep apnea (adult) (pediatric): Secondary | ICD-10-CM | POA: Diagnosis not present

## 2017-02-16 DIAGNOSIS — G4733 Obstructive sleep apnea (adult) (pediatric): Secondary | ICD-10-CM | POA: Diagnosis not present

## 2017-02-17 ENCOUNTER — Telehealth: Payer: Self-pay | Admitting: Pulmonary Disease

## 2017-02-17 DIAGNOSIS — G4733 Obstructive sleep apnea (adult) (pediatric): Secondary | ICD-10-CM

## 2017-02-17 NOTE — Telephone Encounter (Signed)
Called and spoke with pt and he stated that he has been using the cpap every night.  He stated that his BP is down, he is not napping all during the day, he has more energy and he stated that at night he feels that the pressure is just too high for him.  He stated that his mask is leaking and he has this as tight as he can stand this.  apria advised him to call his doctor to have the pressure reduced.  VS please advise. Thanks

## 2017-02-19 NOTE — Telephone Encounter (Signed)
Please have his CPAP reduced from 14 to 12 cm H2O.  Please have download sent 2 weeks after pressure change.

## 2017-02-20 NOTE — Telephone Encounter (Signed)
Pt aware that we are currently awaiting response from VS in regards to pressure settings.

## 2017-02-20 NOTE — Telephone Encounter (Signed)
Can change to 10 cm H2O for CPAP setting.

## 2017-02-20 NOTE — Telephone Encounter (Signed)
Called and spoke with pt. Pt is requesting to reduce pressure a little lower then 12cm.   VS please advise. Thanks.

## 2017-02-20 NOTE — Telephone Encounter (Signed)
Spoke with pt. He is aware of the pressure change. Order has been placed. Nothing further was needed.

## 2017-02-20 NOTE — Telephone Encounter (Signed)
Patient is returning phone call.  °

## 2017-02-20 NOTE — Telephone Encounter (Signed)
lmtcb x1 for pt. 

## 2017-02-21 ENCOUNTER — Telehealth: Payer: Self-pay | Admitting: Pulmonary Disease

## 2017-02-21 NOTE — Telephone Encounter (Signed)
Order placed yesterday afternoon to change pressure. Confirmation of order from [place] documented.    Routing to [facility] for follow-up per triage protocol.

## 2017-02-21 NOTE — Telephone Encounter (Signed)
Called and spoke to the patient and gave him the number.

## 2017-03-14 DIAGNOSIS — G4733 Obstructive sleep apnea (adult) (pediatric): Secondary | ICD-10-CM | POA: Diagnosis not present

## 2017-03-19 DIAGNOSIS — G4733 Obstructive sleep apnea (adult) (pediatric): Secondary | ICD-10-CM | POA: Diagnosis not present

## 2017-03-20 ENCOUNTER — Ambulatory Visit (INDEPENDENT_AMBULATORY_CARE_PROVIDER_SITE_OTHER): Payer: Medicare PPO | Admitting: Adult Health

## 2017-03-20 ENCOUNTER — Encounter: Payer: Self-pay | Admitting: Adult Health

## 2017-03-20 DIAGNOSIS — G4733 Obstructive sleep apnea (adult) (pediatric): Secondary | ICD-10-CM

## 2017-03-20 NOTE — Assessment & Plan Note (Signed)
Well controlled on CPAP  Will adjust CPAP pressure and mask for comfort  Download in 4 weeks   Plan  Patient Instructions  Continue on CPAP with oxygen 2l/m  At bedtime   May change to full face dreamwear gel mask . Marland Kitchen  Change to CPAP 5 to 12 cm H2O CPAP download in 4 weeks .  Keep up good work .  Do not drive if sleepy  Follow up with Dr. Halford Chessman  In 4 months and As needed

## 2017-03-20 NOTE — Progress Notes (Signed)
_0  ID: Randy [name], male    DOB: 07-27-1940, 76 y.o.   MRN: [id number]  Chief Complaint  Patient presents with  . Follow-up    OSA    Referring provider: Marletta Lor, MD  HPI: 76 year old male seen for sleep consult April 2018 for sleep issues found to have severe sleep apnea  TEST  HST 11/22/16 >> AHI 80.7, SaO2 low 52% CPAP titration 12/21/16 >> CPAP 14 cm H2O with 2 liters oxygen.  03/20/2017 Follow up ; OSA  Patient resents for a four-month follow-up. Patient was seen last visit for a sleep consult for daytime sleepiness. He was set up for a home sleep study that showed severe sleep apnea with AHI 80.7, SaO2 low 52%. Patient was set up for his C Pap titration study that showed optimal control at 14 cm H2O with 2 L O2. She was started on C Pap at bedtime. Patient had felt that his pressures were too high. And was decreased down to 10 cm H2O . Since the patient says he is improved with decreased daytime sleepiness. He is tolerating C Pap well. He wears a each night. Download shows excellent compliance with 100% usage. Average daily usage at 8.5 hours. Patient is on C Pap 10 cm H2O. AHI 6.1. Minimum leaks. Since starting CPAP he is feeling better with less daytime sleepiness. Does feel pressure is too much at times.-"builds up " . Mask is not comfortable and hurts bridge of his nose.     No Known Allergies  Immunization History  Administered Date(s) Administered  . Influenza, High Dose Seasonal PF 04/11/2013, 05/08/2015, 04/20/2016  . Influenza,inj,Quad PF,6+ Mos 05/01/2014  . Pneumococcal Conjugate-13 12/19/2013  . Pneumococcal Polysaccharide-23 04/22/2005, 05/08/2015  . Tdap 04/11/2013    Past Medical History:  Diagnosis Date  . Diabetes mellitus   . Hypertension   . OSA (obstructive sleep apnea) 11/24/2016    Tobacco History: History  Smoking Status  . Former Smoker  . Packs/day: 1.00  . Years: 30.00  . Types: Cigarettes  . Quit date: 07/03/2001    Smokeless Tobacco  . Former Systems developer  . Types: Chew  . Quit date: 07/04/1991   Counseling given: Not Answered   Outpatient Encounter Prescriptions as of 03/20/2017  Medication Sig  . Blood Glucose Calibration (TRUE METRIX LEVEL 1) Low SOLN USE TO CHECK METER PRIOR TO TESTING SUGAR  . Blood Glucose Monitoring Suppl (TRUE METRIX AIR GLUCOSE METER) w/Device KIT 1 each by Does not apply route daily.  Marland Kitchen glucose blood test strip USE TO CHECK BLOOD SUGAR DAILY AND PRN  . lisinopril-hydrochlorothiazide (ZESTORETIC) 20-12.5 MG tablet Take 1 tablet by mouth daily.  . metFORMIN (GLUCOPHAGE) 1000 MG tablet Take 1 tablet (1,000 mg total) by mouth daily with breakfast.  . TRUEPLUS LANCETS 33G MISC USE TO CHECK BLOOD SUGAR DAILY AND PRN   No facility-administered encounter medications on file as of 03/20/2017.      Review of Systems  Constitutional:   No  weight loss, night sweats,  Fevers, chills, fatigue, or  lassitude.  HEENT:   No headaches,  Difficulty swallowing,  Tooth/dental problems, or  Sore throat,                No sneezing, itching, ear ache, nasal congestion, post nasal drip,   CV:  No chest pain,  Orthopnea, PND, swelling in lower extremities, anasarca, dizziness, palpitations, syncope.   GI  No heartburn, indigestion, abdominal pain, nausea, vomiting, diarrhea, change in bowel habits,  loss of appetite, bloody stools.   Resp: No shortness of breath with exertion or at rest.  No excess mucus, no productive cough,  No non-productive cough,  No coughing up of blood.  No change in color of mucus.  No wheezing.  No chest wall deformity  Skin: no rash or lesions.  GU: no dysuria, change in color of urine, no urgency or frequency.  No flank pain, no hematuria   MS:  No joint pain or swelling.  No decreased range of motion.  No back pain.    Physical Exam  BP 124/78 (BP Location: Right Arm, Cuff Size: Normal)   Pulse 65   Ht _0  (1.727 m)   Wt 178 lb 3.2 oz (80.8 kg)   SpO2 95%    BMI 27.10 kg/m   GEN: A/Ox3; pleasant , NAD, well nourished    HEENT:  [place]/AT,  EACs-clear, TMs-wnl, NOSE-clear, THROAT-clear, no lesions, no postnasal drip or exudate noted. Class 2-3 MP airway   NECK:  Supple w/ fair ROM; no JVD; normal carotid impulses w/o bruits; no thyromegaly or nodules palpated; no lymphadenopathy.    RESP  Clear  P & A; w/o, wheezes/ rales/ or rhonchi. no accessory muscle use, no dullness to percussion  CARD:  RRR, no m/r/g, no peripheral edema, pulses intact, no cyanosis or clubbing.  GI:   Soft & nt; nml bowel sounds; no organomegaly or masses detected.   Musco: Warm bil, no deformities or joint swelling noted.   Neuro: alert, no focal deficits noted.    Skin: Warm, no lesions or rashes    Lab Results:  CBC   BNP No results found for: BNP  ProBNP No results found for: PROBNP  Imaging: No results found.   Assessment & Plan:   OSA (obstructive sleep apnea) Well controlled on CPAP  Will adjust CPAP pressure and mask for comfort  Download in 4 weeks   Plan  Patient Instructions  Continue on CPAP with oxygen 2l/m  At bedtime   May change to full face dreamwear gel mask . Marland Kitchen  Change to CPAP 5 to 12 cm H2O CPAP download in 4 weeks .  Keep up good work .  Do not drive if sleepy  Follow up with Dr. Halford Chessman  In 4 months and As needed           Rexene Edison, NP 03/20/2017

## 2017-03-20 NOTE — Addendum Note (Signed)
Addended by: Parke Poisson E on: 03/20/2017 03:49 PM   Modules accepted: Orders

## 2017-03-20 NOTE — Patient Instructions (Addendum)
Continue on CPAP with oxygen 2l/m  At bedtime   May change to full face dreamwear gel mask . Marland Kitchen  Change to CPAP 5 to 12 cm H2O CPAP download in 4 weeks .  Keep up good work .  Do not drive if sleepy  Follow up with Dr. Halford Chessman  In 4 months and As needed

## 2017-03-20 NOTE — Progress Notes (Signed)
I have reviewed and agree with assessment/plan.  Chesley Mires, MD [facility] Pulmonary/Critical Care 03/20/2017, 4:10 PM Pager:  [phone]

## 2017-03-22 DIAGNOSIS — G4733 Obstructive sleep apnea (adult) (pediatric): Secondary | ICD-10-CM | POA: Diagnosis not present

## 2017-03-22 DIAGNOSIS — I10 Essential (primary) hypertension: Secondary | ICD-10-CM | POA: Diagnosis not present

## 2017-03-23 ENCOUNTER — Encounter: Payer: Self-pay | Admitting: Internal Medicine

## 2017-03-28 ENCOUNTER — Telehealth: Payer: Self-pay | Admitting: Adult Health

## 2017-03-28 DIAGNOSIS — G4733 Obstructive sleep apnea (adult) (pediatric): Secondary | ICD-10-CM

## 2017-03-28 NOTE — Telephone Encounter (Signed)
Downloads have been printed. Received a message from [employer] that two machines were on his account. I have highlighted the differences between the settings. Will place on VS' desk.

## 2017-03-28 NOTE — Telephone Encounter (Signed)
Called pt to get more details about his CPAP. He he is using the new mask but after a while it started blowing air out the mask. He went back to his old mask and called the DME company [employer]) to let them know and they told him to change the pressure. He wants to know if he needs to decrease the pressure. The old nasal mask was making the skin off his nose come off from rubbing against it. We already changed his pressure recently.

## 2017-03-28 NOTE — Telephone Encounter (Signed)
Please get download from his CPAP and will then decide if he needs pressure adjusted.

## 2017-03-29 NOTE — Telephone Encounter (Signed)
Spoke with patient. He is aware of the pressure change. Will go ahead and send an order to [place]. Nothing else needed at time of call.

## 2017-03-29 NOTE — Telephone Encounter (Signed)
Auto CPAP 03/21/17 to 03/28/17 >> used on 8 of 8 nights with average 8 hrs 55 min.  Average AHI 6.5 with median CPAP 9 and 95 th precentile CPAP 11 cm H2O   Please have CPAP changed to fixed pressure setting of 8 cm H2O.

## 2017-04-12 ENCOUNTER — Ambulatory Visit (INDEPENDENT_AMBULATORY_CARE_PROVIDER_SITE_OTHER): Payer: Medicare PPO

## 2017-04-12 DIAGNOSIS — Z23 Encounter for immunization: Secondary | ICD-10-CM | POA: Diagnosis not present

## 2017-04-13 DIAGNOSIS — G4733 Obstructive sleep apnea (adult) (pediatric): Secondary | ICD-10-CM | POA: Diagnosis not present

## 2017-04-18 DIAGNOSIS — G4733 Obstructive sleep apnea (adult) (pediatric): Secondary | ICD-10-CM | POA: Diagnosis not present

## 2017-04-20 ENCOUNTER — Telehealth: Payer: Self-pay | Admitting: Adult Health

## 2017-04-20 DIAGNOSIS — G4733 Obstructive sleep apnea (adult) (pediatric): Secondary | ICD-10-CM

## 2017-04-20 NOTE — Telephone Encounter (Signed)
Pt seen by TP 9.17.18 for OSA/CPAP follow up >> pressure was changed to auto 5-12 and mask changed to DreamWear full face mask.  Pt called the office on 9.25.18 thinking that his mask was leaking.  VS requested download and off that information, changed patient's pressure to fixed 8cmH2O  03/31/17 - 04/16/17 CPAP download received from [facility] and reviewed by TP: control not as good.  Recommend increase pressure to 10cmH20.  Is the mask better?  LMOM TCB x1 for pt to discuss latest CPAP download results/recommendations.

## 2017-04-21 NOTE — Telephone Encounter (Signed)
Spoke with patient. He is aware of the pressure change. Will mail a copy of his download to him as he has requested. Nothing else needed at time of call.

## 2017-04-25 ENCOUNTER — Telehealth: Payer: Self-pay | Admitting: Adult Health

## 2017-04-25 DIAGNOSIS — G4733 Obstructive sleep apnea (adult) (pediatric): Secondary | ICD-10-CM

## 2017-04-25 NOTE — Telephone Encounter (Signed)
Spoke with pt, who is requesting cpap pressure to be decrease. Pt feels that pressure of 10cm is too strong. Pt states air is blowing out of both sides of his mask and awakes him.  Per 04/20/17 phone note cpap was previously set on auto 5-12, pressure was changed base off of report.  VS please advise. Thanks.

## 2017-04-25 NOTE — Telephone Encounter (Signed)
Changed pressure settings on CPAP and sent an order for CPAP. Spoke with pt and advised him order was sent to [place]. Nothing further is needed.

## 2017-04-25 NOTE — Telephone Encounter (Signed)
Change CPAP to 7 cm H2O.

## 2017-05-02 ENCOUNTER — Telehealth: Payer: Self-pay | Admitting: Adult Health

## 2017-05-02 NOTE — Telephone Encounter (Signed)
I called [facility] and they are not open, will cal again in the morning.

## 2017-05-03 NOTE — Telephone Encounter (Signed)
Forwarding to [facility] per protocol since order was already placed

## 2017-05-03 NOTE — Telephone Encounter (Signed)
Pt was called yesterday by the resp therapist they auto changed his settings and spoke to him yesterday

## 2017-05-14 DIAGNOSIS — G4733 Obstructive sleep apnea (adult) (pediatric): Secondary | ICD-10-CM | POA: Diagnosis not present

## 2017-05-16 ENCOUNTER — Encounter: Payer: Self-pay | Admitting: Internal Medicine

## 2017-05-17 ENCOUNTER — Other Ambulatory Visit: Payer: Self-pay | Admitting: Internal Medicine

## 2017-05-19 DIAGNOSIS — G4733 Obstructive sleep apnea (adult) (pediatric): Secondary | ICD-10-CM | POA: Diagnosis not present

## 2017-06-05 ENCOUNTER — Encounter: Payer: Self-pay | Admitting: Adult Health

## 2017-06-05 DIAGNOSIS — G4733 Obstructive sleep apnea (adult) (pediatric): Secondary | ICD-10-CM

## 2017-06-05 NOTE — Telephone Encounter (Signed)
Received the following email from patient:   ----- Message -----  From: Allena Katz  Sent: 06/05/2017 11:57 AM EST  To: Rexene Edison, NP Subject: Non-Urgent Medical Question  [employer] has suggested I report to you a steady increase in "Events" the past six days.The events were down to a very good "2'and over the last six days have climbed to "20".I seem to be sleeping okay and not having any problems breathing with the mask. What causes the "events" to increase?We had a big Christmas party at the house yesterday with about 20 people and we have had house guests for over a week. Could "Holiday stress", too much food, too much drink, and spending a lot of money have an effect on Events? How do I lower the number of events? Wait and see if everyone leaving, cutting back on food and drink, and exercising will make it come down?  I have asked Janett Billow to print out a download and give it to you since I am in [place].  Tammy, please advise once you have received the download. Thanks!

## 2017-06-06 NOTE — Telephone Encounter (Signed)
See e-mail from TP to patient >> increase pressure to 8cm Order placed to [place], with download to TP in 30 days E-mail sent to patient informing him that order has been placed and he will be hearing from [place] again after the next download  Nothing further needed; will sign off

## 2017-06-13 DIAGNOSIS — G4733 Obstructive sleep apnea (adult) (pediatric): Secondary | ICD-10-CM | POA: Diagnosis not present

## 2017-06-18 DIAGNOSIS — G4733 Obstructive sleep apnea (adult) (pediatric): Secondary | ICD-10-CM | POA: Diagnosis not present

## 2017-06-21 ENCOUNTER — Encounter: Payer: Self-pay | Admitting: Adult Health

## 2017-06-21 NOTE — Telephone Encounter (Signed)
VS- please see email from pt and advise recs, thanks  Tammy, The increase in pressure to 8 may or may not be improving my occurrences but the added pressure on my nose is unbearable.It is actually bleeding this morning.I tried a band-aid and it seems to help.Do you think a nose pad would help? Can I order some nose pads or do I need your help to order?My mask is [employer] or [employer].  Thanks for all your help.the CPak seems to be doing what it's suppose to do.  Anne Fu

## 2017-07-04 HISTORY — PX: CATARACT EXTRACTION, BILATERAL: SHX1313

## 2017-07-14 DIAGNOSIS — G4733 Obstructive sleep apnea (adult) (pediatric): Secondary | ICD-10-CM | POA: Diagnosis not present

## 2017-07-19 DIAGNOSIS — G4733 Obstructive sleep apnea (adult) (pediatric): Secondary | ICD-10-CM | POA: Diagnosis not present

## 2017-08-02 DIAGNOSIS — E119 Type 2 diabetes mellitus without complications: Secondary | ICD-10-CM | POA: Diagnosis not present

## 2017-08-02 DIAGNOSIS — H5212 Myopia, left eye: Secondary | ICD-10-CM | POA: Diagnosis not present

## 2017-08-02 DIAGNOSIS — H5201 Hypermetropia, right eye: Secondary | ICD-10-CM | POA: Diagnosis not present

## 2017-08-02 DIAGNOSIS — H2513 Age-related nuclear cataract, bilateral: Secondary | ICD-10-CM | POA: Diagnosis not present

## 2017-08-02 DIAGNOSIS — H52223 Regular astigmatism, bilateral: Secondary | ICD-10-CM | POA: Diagnosis not present

## 2017-08-07 ENCOUNTER — Ambulatory Visit: Payer: Medicare PPO | Admitting: Podiatry

## 2017-08-10 ENCOUNTER — Encounter: Payer: Self-pay | Admitting: Podiatry

## 2017-08-10 ENCOUNTER — Ambulatory Visit: Payer: Medicare PPO | Admitting: Podiatry

## 2017-08-10 VITALS — BP 148/71 | HR 77

## 2017-08-10 DIAGNOSIS — E119 Type 2 diabetes mellitus without complications: Secondary | ICD-10-CM | POA: Diagnosis not present

## 2017-08-10 DIAGNOSIS — B353 Tinea pedis: Secondary | ICD-10-CM | POA: Diagnosis not present

## 2017-08-10 DIAGNOSIS — E1151 Type 2 diabetes mellitus with diabetic peripheral angiopathy without gangrene: Secondary | ICD-10-CM | POA: Diagnosis not present

## 2017-08-10 DIAGNOSIS — B351 Tinea unguium: Secondary | ICD-10-CM | POA: Diagnosis not present

## 2017-08-10 MED ORDER — KETOCONAZOLE 2 % EX CREA: TOPICAL_CREAM | CUTANEOUS | 0 refills | Status: DC

## 2017-08-10 NOTE — Progress Notes (Addendum)
  Subjective:  Patient ID: Randy [name], male    DOB: 01-26-1941,  MRN: [id number]  Chief Complaint  Patient presents with  . Diabetes    Diabetic Foot Exam - Last A1C - 6.1  . Foot Swelling    B/L ankle swelling x "few months"    77 y.o. male returns for diabetic foot care. Last A1c 6.1. Denies numbness and tingling in their feet. Denies cramping in legs and thighs. Reports bilateral ankle swelling left greater than right.  Been present for a few months. Past Medical History:  Diagnosis Date  . Diabetes mellitus   . Hypertension   . OSA (obstructive sleep apnea) 11/24/2016   Past Surgical History:  Procedure Laterality Date  . COLONOSCOPY  07/06/2011   Procedure: COLONOSCOPY;  Surgeon: Winfield Cunas., MD;  Location: [facility] ENDOSCOPY;  Service: Endoscopy;  Laterality: N/A;  . ESOPHAGOGASTRODUODENOSCOPY  07/05/2011   Procedure: ESOPHAGOGASTRODUODENOSCOPY (EGD);  Surgeon: Landry Dyke, MD;  Location: [facility] ENDOSCOPY;  Service: Endoscopy;  Laterality: N/A;    Current Outpatient Medications:  .  Blood Glucose Calibration (TRUE METRIX LEVEL 1) Low SOLN, USE TO CHECK METER PRIOR TO TESTING SUGAR, Disp: 3 each, Rfl: 0 .  Blood Glucose Monitoring Suppl (TRUE METRIX AIR GLUCOSE METER) w/Device KIT, 1 each by Does not apply route daily., Disp: 1 kit, Rfl: 0 .  glucose blood test strip, USE TO CHECK BLOOD SUGAR DAILY AND PRN, Disp: 300 each, Rfl: 0 .  ketoconazole (NIZORAL) 2 % cream, Apply 1 fingertip amount to each foot twice daily., Disp: 30 g, Rfl: 0 .  lisinopril-hydrochlorothiazide (PRINZIDE,ZESTORETIC) 20-12.5 MG tablet, TAKE ONE TABLET BY MOUTH DAILY, Disp: 90 tablet, Rfl: 0 .  metFORMIN (GLUCOPHAGE) 1000 MG tablet, Take 1 tablet (1,000 mg total) by mouth daily with breakfast., Disp: 90 tablet, Rfl: 1 .  TRUEPLUS LANCETS 33G MISC, USE TO CHECK BLOOD SUGAR DAILY AND PRN, Disp: 300 each, Rfl: 0  No Known Allergies   Objective:   Vitals:   08/10/17 1329  BP: (!) 148/71  Pulse: 77    General AA&O x3. Normal mood and affect.  Vascular Dorsalis pedis and posterior tibial pulses  present 2+ bilaterally  Capillary refill normal to all digits. Pedal hair growth normal. Bilateral edema  Neurologic Epicritic sensation grossly present.  Dermatologic No open lesions. Interspaces clear of maceration. Nails with thickening discoloration Xerosis with scaling to the plantar foot,  Orthopedic: MMT 5/5 in dorsiflexion, plantarflexion, inversion, and eversion. Normal joint ROM without pain or crepitus.   Assessment & Plan:  Patient was evaluated and treated and all questions answered.  Diabetes without Complication, Onychomycosis -Educated on diabetic footcare. Diabetic risk level 0  Venous insufficiency bilaterally -Recommend compression socks  Onychomycosis, tinea pedis -Rx topical ketoconazole.  Educated on application  Return in about 6 months (around 02/07/2018) for Diabetic Foot Care.

## 2017-08-10 NOTE — Patient Instructions (Signed)

## 2017-08-14 DIAGNOSIS — H35371 Puckering of macula, right eye: Secondary | ICD-10-CM | POA: Diagnosis not present

## 2017-08-14 DIAGNOSIS — H47323 Drusen of optic disc, bilateral: Secondary | ICD-10-CM | POA: Diagnosis not present

## 2017-08-14 DIAGNOSIS — G4733 Obstructive sleep apnea (adult) (pediatric): Secondary | ICD-10-CM | POA: Diagnosis not present

## 2017-08-14 DIAGNOSIS — H25813 Combined forms of age-related cataract, bilateral: Secondary | ICD-10-CM | POA: Diagnosis not present

## 2017-08-14 LAB — HM DIABETES EYE EXAM

## 2017-08-15 ENCOUNTER — Ambulatory Visit: Payer: Medicare PPO | Admitting: Orthotics

## 2017-08-15 DIAGNOSIS — E1151 Type 2 diabetes mellitus with diabetic peripheral angiopathy without gangrene: Secondary | ICD-10-CM

## 2017-08-15 NOTE — Progress Notes (Signed)
Patient was seen today for measurment/selection of diabetic shoes and custom diabetic inserts.  Patient reports some tingling in feet and displays b/l hammertoes.   Patient was measured 7.5 med and chose NB Grey 1165 shoe.

## 2017-08-16 ENCOUNTER — Other Ambulatory Visit: Payer: Self-pay | Admitting: Internal Medicine

## 2017-08-19 DIAGNOSIS — G4733 Obstructive sleep apnea (adult) (pediatric): Secondary | ICD-10-CM | POA: Diagnosis not present

## 2017-08-23 ENCOUNTER — Encounter: Payer: Self-pay | Admitting: Internal Medicine

## 2017-08-24 DIAGNOSIS — H25812 Combined forms of age-related cataract, left eye: Secondary | ICD-10-CM | POA: Diagnosis not present

## 2017-08-24 DIAGNOSIS — H2512 Age-related nuclear cataract, left eye: Secondary | ICD-10-CM | POA: Diagnosis not present

## 2017-09-11 DIAGNOSIS — G4733 Obstructive sleep apnea (adult) (pediatric): Secondary | ICD-10-CM | POA: Diagnosis not present

## 2017-09-14 ENCOUNTER — Ambulatory Visit (INDEPENDENT_AMBULATORY_CARE_PROVIDER_SITE_OTHER): Payer: Medicare PPO | Admitting: Orthotics

## 2017-09-14 DIAGNOSIS — M2042 Other hammer toe(s) (acquired), left foot: Secondary | ICD-10-CM

## 2017-09-14 DIAGNOSIS — E1151 Type 2 diabetes mellitus with diabetic peripheral angiopathy without gangrene: Secondary | ICD-10-CM

## 2017-09-14 DIAGNOSIS — M2041 Other hammer toe(s) (acquired), right foot: Secondary | ICD-10-CM

## 2017-09-14 NOTE — Progress Notes (Signed)

## 2017-09-16 DIAGNOSIS — G4733 Obstructive sleep apnea (adult) (pediatric): Secondary | ICD-10-CM | POA: Diagnosis not present

## 2017-09-21 DIAGNOSIS — G4733 Obstructive sleep apnea (adult) (pediatric): Secondary | ICD-10-CM | POA: Diagnosis not present

## 2017-09-21 DIAGNOSIS — H2511 Age-related nuclear cataract, right eye: Secondary | ICD-10-CM | POA: Diagnosis not present

## 2017-09-21 DIAGNOSIS — H25811 Combined forms of age-related cataract, right eye: Secondary | ICD-10-CM | POA: Diagnosis not present

## 2017-09-26 DIAGNOSIS — G4733 Obstructive sleep apnea (adult) (pediatric): Secondary | ICD-10-CM | POA: Diagnosis not present

## 2017-10-12 DIAGNOSIS — G4733 Obstructive sleep apnea (adult) (pediatric): Secondary | ICD-10-CM | POA: Diagnosis not present

## 2017-10-15 ENCOUNTER — Encounter: Payer: Self-pay | Admitting: Pulmonary Disease

## 2017-10-15 DIAGNOSIS — G4734 Idiopathic sleep related nonobstructive alveolar hypoventilation: Secondary | ICD-10-CM

## 2017-10-15 DIAGNOSIS — G4733 Obstructive sleep apnea (adult) (pediatric): Secondary | ICD-10-CM

## 2017-10-16 NOTE — Telephone Encounter (Signed)
Please get a copy of his CPAP download, and arrange for ONO with CPAP and 2 liters oxygen.

## 2017-10-16 NOTE — Telephone Encounter (Signed)
Received an email from pt stating the below information:  In the past few weeks, I have had little stamina working in the yard.My C-Pap numbers look good everyday and I do not fall asleep like before C-Pap.Would increasing my oxygen from 2 to 4 liters help my energy?  I have an annual physical scheduled for May 7 with Dr. Collene Gobble sugar levels (110 today) and my blood pressure (125/59) seems to be good.   Dr. Halford Chessman, please advise on any recommendations for pt. Thanks!

## 2017-10-16 NOTE — Telephone Encounter (Signed)
Download printed and given to VS for him to review.  Order placed for ONO to be done with pt's CPAP and O2.

## 2017-10-17 DIAGNOSIS — G4733 Obstructive sleep apnea (adult) (pediatric): Secondary | ICD-10-CM | POA: Diagnosis not present

## 2017-11-01 DIAGNOSIS — G4733 Obstructive sleep apnea (adult) (pediatric): Secondary | ICD-10-CM | POA: Diagnosis not present

## 2017-11-05 ENCOUNTER — Telehealth: Payer: Self-pay | Admitting: Pulmonary Disease

## 2017-11-05 DIAGNOSIS — G4733 Obstructive sleep apnea (adult) (pediatric): Secondary | ICD-10-CM

## 2017-11-05 NOTE — Telephone Encounter (Signed)
CPAP 09/16/17 to 10/15/17 >> used on 30 of 30 nights with average 9 hrs 12 min.  Average AHI 2.2 with CPAP 8 cm H2O.  ONO with CPAP and 2 liters 10/30/17 >> Test time 9 hrs 4 min.  Basal SpO2 95.5%, low SpO2 77%.  Spent 14 min with SpO2 < 88%.   Please let him know that CPAP reports shows good control of sleep apnea with CPAP at 8 cm H2O, but his oxygen is still low at night due to shallow breathing (hypoventilation).  Please sent order to change his set up to using CPAP at 8 cm H2O with 4 liters oxygen at night.

## 2017-11-06 NOTE — Telephone Encounter (Signed)
Left voice mail on machine for patient to return phone call back regarding results. X1  

## 2017-11-07 ENCOUNTER — Ambulatory Visit (INDEPENDENT_AMBULATORY_CARE_PROVIDER_SITE_OTHER): Payer: Medicare PPO | Admitting: Internal Medicine

## 2017-11-07 ENCOUNTER — Encounter: Payer: Self-pay | Admitting: Internal Medicine

## 2017-11-07 VITALS — BP 110/62 | HR 44 | Temp 98.0°F | Ht 68.75 in | Wt 172.8 lb

## 2017-11-07 DIAGNOSIS — G4733 Obstructive sleep apnea (adult) (pediatric): Secondary | ICD-10-CM | POA: Diagnosis not present

## 2017-11-07 DIAGNOSIS — E7800 Pure hypercholesterolemia, unspecified: Secondary | ICD-10-CM

## 2017-11-07 DIAGNOSIS — Z0000 Encounter for general adult medical examination without abnormal findings: Secondary | ICD-10-CM | POA: Diagnosis not present

## 2017-11-07 DIAGNOSIS — I10 Essential (primary) hypertension: Secondary | ICD-10-CM

## 2017-11-07 DIAGNOSIS — E0800 Diabetes mellitus due to underlying condition with hyperosmolarity without nonketotic hyperglycemic-hyperosmolar coma (NKHHC): Secondary | ICD-10-CM | POA: Diagnosis not present

## 2017-11-07 DIAGNOSIS — E119 Type 2 diabetes mellitus without complications: Secondary | ICD-10-CM

## 2017-11-07 LAB — CBC WITH DIFFERENTIAL/PLATELET
BASOS ABS: 0 10*3/uL (ref 0.0–0.1)
Basophils Relative: 0.4 % (ref 0.0–3.0)
Eosinophils Absolute: 0.3 10*3/uL (ref 0.0–0.7)
Eosinophils Relative: 4.6 % (ref 0.0–5.0)
HCT: 42.5 % (ref 39.0–52.0)
Hemoglobin: 14 g/dL (ref 13.0–17.0)
LYMPHS ABS: 1.5 10*3/uL (ref 0.7–4.0)
Lymphocytes Relative: 21.1 % (ref 12.0–46.0)
MCHC: 33 g/dL (ref 30.0–36.0)
MCV: 98.2 fl (ref 78.0–100.0)
MONOS PCT: 10.1 % (ref 3.0–12.0)
Monocytes Absolute: 0.7 10*3/uL (ref 0.1–1.0)
NEUTROS PCT: 63.8 % (ref 43.0–77.0)
Neutro Abs: 4.5 10*3/uL (ref 1.4–7.7)
Platelets: 216 10*3/uL (ref 150.0–400.0)
RBC: 4.33 Mil/uL (ref 4.22–5.81)
RDW: 13.1 % (ref 11.5–15.5)
WBC: 7 10*3/uL (ref 4.0–10.5)

## 2017-11-07 LAB — COMPREHENSIVE METABOLIC PANEL
ALBUMIN: 4.3 g/dL (ref 3.5–5.2)
ALK PHOS: 45 U/L (ref 39–117)
ALT: 12 U/L (ref 0–53)
AST: 11 U/L (ref 0–37)
BUN: 16 mg/dL (ref 6–23)
CALCIUM: 11 mg/dL — AB (ref 8.4–10.5)
CO2: 38 mEq/L — ABNORMAL HIGH (ref 19–32)
Chloride: 100 mEq/L (ref 96–112)
Creatinine, Ser: 1.03 mg/dL (ref 0.40–1.50)
GFR: 74.4 mL/min (ref >60.00)
Glucose, Bld: 63 mg/dL — ABNORMAL LOW (ref 70–99)
POTASSIUM: 5.2 meq/L — AB (ref 3.5–5.1)
Sodium: 141 mEq/L (ref 135–145)
TOTAL PROTEIN: 6.4 g/dL (ref 6.0–8.3)
Total Bilirubin: 0.3 mg/dL (ref 0.2–1.2)

## 2017-11-07 LAB — MICROALBUMIN / CREATININE URINE RATIO
Creatinine,U: 97.8 mg/dL
MICROALB/CREAT RATIO: 0.7 mg/g (ref 0.0–30.0)

## 2017-11-07 LAB — LIPID PANEL
CHOLESTEROL: 183 mg/dL (ref 0–200)
HDL: 56.6 mg/dL (ref >39.00)
LDL CALC: 92 mg/dL (ref 0–99)
NonHDL: 126.35
TRIGLYCERIDES: 173 mg/dL — AB (ref 0.0–149.0)
Total CHOL/HDL Ratio: 3
VLDL: 34.6 mg/dL (ref 0.0–40.0)

## 2017-11-07 LAB — HEMOGLOBIN A1C: HEMOGLOBIN A1C: 6 % (ref 4.6–6.5)

## 2017-11-07 LAB — TSH: TSH: 1.76 u[IU]/mL (ref 0.35–4.50)

## 2017-11-07 NOTE — Patient Instructions (Addendum)
Limit your sodium (Salt) intake    It is important that you exercise regularly, at least 20 minutes 3 to 4 times per week.  If you develop chest pain or shortness of breath seek  medical attention.   Please check your hemoglobin A1c every 6 months     Shoulder Impingement Syndrome Rehab Ask your health care provider which exercises are safe for you. Do exercises exactly as told by your health care provider and adjust them as directed. It is normal to feel mild stretching, pulling, tightness, or discomfort as you do these exercises, but you should stop right away if you feel sudden pain or your pain gets worse.Do not begin these exercises until told by your health care provider. Stretching and range of motion exercise This exercise warms up your muscles and joints and improves the movement and flexibility of your shoulder. This exercise also helps to relieve pain and stiffness. Exercise A: Passive horizontal adduction  1. Sit or stand and pull your left / right elbow across your chest, toward your other shoulder. Stop when you feel a gentle stretch in the back of your shoulder and upper arm. ? Keep your arm at shoulder height. ? Keep your arm as close to your body as you comfortably can. 2. Hold for __________ seconds. 3. Slowly return to the starting position. Repeat __________ times. Complete this exercise __________ times a day. Strengthening exercises These exercises build strength and endurance in your shoulder. Endurance is the ability to use your muscles for a long time, even after they get tired. Exercise B: External rotation, isometric 1. Stand or sit in a doorway, facing the door frame. 2. Bend your left / right elbow and place the back of your wrist against the door frame. Only your wrist should be touching the frame. Keep your upper arm at your side. 3. Gently press your wrist against the door frame, as if you are trying to push your arm away from your abdomen. ? Avoid  shrugging your shoulder while you press your hand against the door frame. Keep your shoulder blade tucked down toward the middle of your back. 4. Hold for __________ seconds. 5. Slowly release the tension, and relax your muscles completely before you do the exercise again. Repeat __________ times. Complete this exercise __________ times a day. Exercise C: Internal rotation, isometric  1. Stand or sit in a doorway, facing the door frame. 2. Bend your left / right elbow and place the inside of your wrist against the door frame. Only your wrist should be touching the frame. Keep your upper arm at your side. 3. Gently press your wrist against the door frame, as if you are trying to push your arm toward your abdomen. ? Avoid shrugging your shoulder while you press your hand against the door frame. Keep your shoulder blade tucked down toward the middle of your back. 4. Hold for __________ seconds. 5. Slowly release the tension, and relax your muscles completely before you do the exercise again. Repeat __________ times. Complete this exercise __________ times a day. Exercise D: Scapular protraction, supine  1. Lie on your back on a firm surface. Hold a __________ weight in your left / right hand. 2. Raise your left / right arm straight into the air so your hand is directly above your shoulder joint. 3. Push the weight into the air so your shoulder lifts off of the surface that you are lying on. Do not move your head, neck, or back. 4. Hold  for __________ seconds. 5. Slowly return to the starting position. Let your muscles relax completely before you repeat this exercise. Repeat __________ times. Complete this exercise __________ times a day. Exercise E: Scapular retraction  1. Sit in a stable chair without armrests, or stand. 2. Secure an exercise band to a stable object in front of you so the band is at shoulder height. 3. Hold one end of the exercise band in each hand. Your palms should face  down. 4. Squeeze your shoulder blades together and move your elbows slightly behind you. Do not shrug your shoulders while you do this. 5. Hold for __________ seconds. 6. Slowly return to the starting position. Repeat __________ times. Complete this exercise __________ times a day. Exercise F: Shoulder extension  1. Sit in a stable chair without armrests, or stand. 2. Secure an exercise band to a stable object in front of you where the band is above shoulder height. 3. Hold one end of the exercise band in each hand. 4. Straighten your elbows and lift your hands up to shoulder height. 5. Squeeze your shoulder blades together and pull your hands down to the sides of your thighs. Stop when your hands are straight down by your sides. Do not let your hands go behind your body. 6. Hold for __________ seconds. 7. Slowly return to the starting position. Repeat __________ times. Complete this exercise __________ times a day. This information is not intended to replace advice given to you by your health care provider. Make sure you discuss any questions you have with your health care provider. Document Released: 06/20/2005 Document Revised: 02/25/2016 Document Reviewed: 05/23/2015 Elsevier Interactive Patient Education  [employer].

## 2017-11-07 NOTE — Progress Notes (Signed)
Subjective:    Patient ID: Randy [name], male    DOB: April 28, 1941, 77 y.o.   MRN: [id number]  HPI  77 year old patient who is seen today for a annual preventive health examination and subsequent Medicare wellness visit he is followed by pulmonary medicine with a history of OSA.  He has essential hypertension and type 2 diabetes controlled with metformin therapy only.  Done quite well today.  His only real complaint is some bilateral shoulder stiffness.   Since his last annual exam he has had bilateral cataract extraction surgery which he has been quite pleased. Diabetic foot examination performed today   colonoscopy 2013  Past Medical History:  Diagnosis Date  . Diabetes mellitus   . Hypertension   . OSA (obstructive sleep apnea) 11/24/2016     Social History   Socioeconomic History  . Marital status: Married    Spouse name: Not on file  . Number of children: Not on file  . Years of education: Not on file  . Highest education level: Not on file  Occupational History  . Occupation: retired  Scientific laboratory technician  . Financial resource strain: Not on file  . Food insecurity:    Worry: Not on file    Inability: Not on file  . Transportation needs:    Medical: Not on file    Non-medical: Not on file  Tobacco Use  . Smoking status: Former Smoker    Packs/day: 1.00    Years: 30.00    Pack years: 30.00    Types: Cigarettes    Last attempt to quit: 07/03/2001    Years since quitting: 16.3  . Smokeless tobacco: Former Systems developer    Types: [place] date: 07/04/1991  Substance and Sexual Activity  . Alcohol use: Yes    Alcohol/week: 1.8 oz    Types: 3 Glasses of wine per week    Comment: daily alcohol wine with dinner  . Drug use: No  . Sexual activity: Yes  Lifestyle  . Physical activity:    Days per week: Not on file    Minutes per session: Not on file  . Stress: Not on file  Relationships  . Social connections:    Talks on phone: Not on file    Gets together: Not on  file    Attends religious service: Not on file    Active member of club or organization: Not on file    Attends meetings of clubs or organizations: Not on file    Relationship status: Not on file  . Intimate partner violence:    Fear of current or ex partner: Not on file    Emotionally abused: Not on file    Physically abused: Not on file    Forced sexual activity: Not on file  Other Topics Concern  . Not on file  Social History Narrative  . Not on file    Past Surgical History:  Procedure Laterality Date  . COLONOSCOPY  07/06/2011   Procedure: COLONOSCOPY;  Surgeon: Winfield Cunas., MD;  Location: [facility] ENDOSCOPY;  Service: Endoscopy;  Laterality: N/A;  . ESOPHAGOGASTRODUODENOSCOPY  07/05/2011   Procedure: ESOPHAGOGASTRODUODENOSCOPY (EGD);  Surgeon: Landry Dyke, MD;  Location[facility] ENDOSCOPY;  Service: Endoscopy;  Laterality: N/A;    Family History  Problem Relation Age of Onset  . Brain cancer Mother     No Known Allergies  Current Outpatient Medications on File Prior to Visit  Medication Sig Dispense Refill  . Blood Glucose  Calibration (TRUE METRIX LEVEL 1) Low SOLN USE TO CHECK METER PRIOR TO TESTING SUGAR 3 each 0  . Blood Glucose Monitoring Suppl (TRUE METRIX AIR GLUCOSE METER) w/Device KIT 1 each by Does not apply route daily. 1 kit 0  . glucose blood test strip USE TO CHECK BLOOD SUGAR DAILY AND PRN 300 each 0  . ketoconazole (NIZORAL) 2 % cream Apply 1 fingertip amount to each foot twice daily. 30 g 0  . lisinopril-hydrochlorothiazide (PRINZIDE,ZESTORETIC) 20-12.5 MG tablet TAKE ONE TABLET BY MOUTH DAILY 90 tablet 0  . metFORMIN (GLUCOPHAGE) 1000 MG tablet Take 1 tablet (1,000 mg total) by mouth daily with breakfast. 90 tablet 1  . TRUEPLUS LANCETS 33G MISC USE TO CHECK BLOOD SUGAR DAILY AND PRN 300 each 0   No current facility-administered medications on file prior to visit.     BP 110/62 (BP Location: Right Arm, Patient Position: Sitting, Cuff Size: Large)    Pulse (!) 44   Temp 98 F (36.7 C) (Oral)   Ht 5' 8.75" (1.746 m)   Wt 172 lb 12.8 oz (78.4 kg)   SpO2 98%   BMI 25.70 kg/m   Subsequent Medicare wellness visit  1. Risk factors, based on past  M,S,F history.  Cardiovascular risk factors include a history of essential hypertension as well as type 2 diabetes.  2.  Physical activities:  Remains active no exercise limitations. 3.  Depression/mood: No history of major depression or mood disorder  4.  Hearing: Mild deficits only I 5.  ADL's: Independent  6.  Fall risk: Low  7.  Home safety: No problems identified  8.  Height weight, and visual acuity; height and weight stable very pleased following cataract extraction surgery within the past year  9.  Counseling: Continue heart healthy diet more regular exercise encouraged  10. Lab orders based on risk factors: Laboratory update will be reviewed.  This will also include a lipid profile hemoglobin A1c and urine for microalbumin  11. Referral : Follow-up pulmonary medicine and podiatry  12. Care plan: Continue efforts at aggressive risk factor modification alert and appropriate normal affect.  No cognitive dysfunction  13. Cognitive assessment:   14. Screening: Patient provided with a written and personalized 5-10 year screening schedule in the AVS.    15. Provider List Update: Primary care pulmonary medicine podiatry and ophthalmology       Review of Systems  Constitutional: Negative for appetite change, chills, fatigue and fever.  HENT: Negative for congestion, dental problem, ear pain, hearing loss, sore throat, tinnitus, trouble swallowing and voice change.   Eyes: Negative for pain, discharge and visual disturbance.  Respiratory: Negative for cough, chest tightness, wheezing and stridor.   Cardiovascular: Negative for chest pain, palpitations and leg swelling.  Gastrointestinal: Negative for abdominal distention, abdominal pain, blood in stool, constipation, diarrhea,  nausea and vomiting.  Genitourinary: Negative for difficulty urinating, discharge, flank pain, genital sores, hematuria and urgency.  Musculoskeletal: Negative for arthralgias, back pain, gait problem, joint swelling, myalgias and neck stiffness.       Bilateral shoulder stiffness and decreased range of motion  Skin: Negative for rash.  Neurological: Negative for dizziness, syncope, speech difficulty, weakness, numbness and headaches.  Hematological: Negative for adenopathy. Does not bruise/bleed easily.  Psychiatric/Behavioral: Negative for behavioral problems and dysphoric mood. The patient is not nervous/anxious.        Objective:   Physical Exam  Constitutional: He is oriented to person, place, and time. He appears well-developed.  Blood  pressure 135/68  HENT:  Head: Normocephalic.  Right Ear: External ear normal.  Left Ear: External ear normal.  Eyes: Conjunctivae and EOM are normal.  Neck: Normal range of motion.  Cardiovascular: Normal rate and normal heart sounds.  Pulmonary/Chest: Breath sounds normal.  Few crackles right base  Abdominal: Bowel sounds are normal.  Genitourinary:  Genitourinary Comments:  Prostate +3 enlarged stool hematest negative  Musculoskeletal: Normal range of motion. He exhibits no edema or tenderness.  Neurological: He is alert and oriented to person, place, and time.  Psychiatric: He has a normal mood and affect. His behavior is normal.          Assessment & Plan:  Preventive health examination  Subsequent Medicare wellness visit Diabetes mellitus type 2.  Will review a hemoglobin A1c urine for microalbumin Essential hypertension stable no change in therapy will continue ACE inhibition and diuretic therapy Bilateral shoulder stiffness.  Patient was given information concerning rehab exercises.  Will consider referral for physical therapy if unimproved OSA.  Follow-up pulmonary medicine  Follow-up 6 months  Yannely Kintzel Pilar Plate

## 2017-11-07 NOTE — Telephone Encounter (Signed)
Called pt and advised message from the provider. Pt understood and verbalized understanding. Placed order for CPAP and oxygen.   Traveling for 2 weeks and will not be able to travel with oxygen concentrator. Can pt receive a travel/loaner for this time? Called [employer] and they will call pt to give pt the travel teams number so he can call them to set up oxygen while he is out of town. Nothing further is needed.

## 2017-11-07 NOTE — Telephone Encounter (Signed)
Patient returned call, CB is [phone].

## 2017-11-09 ENCOUNTER — Encounter: Payer: Self-pay | Admitting: Adult Health

## 2017-11-09 ENCOUNTER — Other Ambulatory Visit: Payer: Self-pay

## 2017-11-09 MED ORDER — LISINOPRIL-HYDROCHLOROTHIAZIDE 20-12.5 MG PO TABS: 1.0000 | ORAL_TABLET | Freq: Every day | ORAL | 0 refills | Status: DC

## 2017-11-09 MED ORDER — GLUCOSE BLOOD VI STRP: ORAL_STRIP | 0 refills | Status: DC

## 2017-11-09 MED ORDER — TRUEPLUS LANCETS 33G MISC: 0 refills | Status: AC

## 2017-11-09 MED ORDER — TRUE METRIX LEVEL 1 LOW VI SOLN: 0 refills | Status: AC

## 2017-11-09 MED ORDER — METFORMIN HCL 1000 MG PO TABS: 1000.0000 mg | ORAL_TABLET | Freq: Every day | ORAL | 1 refills | Status: DC

## 2017-11-09 MED ORDER — TRUE METRIX AIR GLUCOSE METER W/DEVICE KIT: 1.0000 | PACK | Freq: Every day | 0 refills | Status: AC

## 2017-11-09 NOTE — Telephone Encounter (Signed)
   11/09/17 2:02 PM  What happens if I do not have the additional oxygen for two weeks? [place] and [employer] will supply a loaner concentrator but charge me $295 delivery. I don't think I want to carry the unit in my house. I will carry the C-Pap of course.  My numbers are looking good every day. Thanks for all your support!  Randy [name]    VS please advise. Thanks

## 2017-11-11 DIAGNOSIS — G4733 Obstructive sleep apnea (adult) (pediatric): Secondary | ICD-10-CM | POA: Diagnosis not present

## 2017-11-16 DIAGNOSIS — G4733 Obstructive sleep apnea (adult) (pediatric): Secondary | ICD-10-CM | POA: Diagnosis not present

## 2017-11-24 ENCOUNTER — Telehealth: Payer: Self-pay | Admitting: Internal Medicine

## 2017-11-24 MED ORDER — GLUCOSE BLOOD VI STRP: ORAL_STRIP | 0 refills | Status: DC

## 2017-11-24 NOTE — Telephone Encounter (Signed)
Copied from [place] [id number]. Topic: Quick Communication - Rx Refill/Question >> Nov 24, 2017  2:52 PM Percell Belt A wrote: Medication: glucose blood test strip [[id number]]  Has the patient contacted their pharmacy? No  (Agent: If no, request that the patient contact the pharmacy for the refill.) (Agent: If yes, when and what did the pharmacy advise?)  Preferred Pharmacy (with phone number or street name): [facility] mail order.  This was sent to [facility] but should have been sent to mail order  Agent: Please be advised that RX refills may take up to 3 business days. We ask that you follow-up with your pharmacy.

## 2017-11-24 NOTE — Telephone Encounter (Signed)
Glucose test strips resent to [facility] as requested.

## 2017-11-27 ENCOUNTER — Other Ambulatory Visit: Payer: Self-pay | Admitting: Internal Medicine

## 2017-12-12 DIAGNOSIS — G4733 Obstructive sleep apnea (adult) (pediatric): Secondary | ICD-10-CM | POA: Diagnosis not present

## 2017-12-17 DIAGNOSIS — G4733 Obstructive sleep apnea (adult) (pediatric): Secondary | ICD-10-CM | POA: Diagnosis not present

## 2018-01-11 DIAGNOSIS — G4733 Obstructive sleep apnea (adult) (pediatric): Secondary | ICD-10-CM | POA: Diagnosis not present

## 2018-01-16 DIAGNOSIS — G4733 Obstructive sleep apnea (adult) (pediatric): Secondary | ICD-10-CM | POA: Diagnosis not present

## 2018-01-20 ENCOUNTER — Encounter: Payer: Self-pay | Admitting: Adult Health

## 2018-02-08 ENCOUNTER — Ambulatory Visit: Payer: Medicare PPO | Admitting: Podiatry

## 2018-02-08 DIAGNOSIS — E1151 Type 2 diabetes mellitus with diabetic peripheral angiopathy without gangrene: Secondary | ICD-10-CM

## 2018-02-08 DIAGNOSIS — E119 Type 2 diabetes mellitus without complications: Secondary | ICD-10-CM

## 2018-02-08 DIAGNOSIS — B351 Tinea unguium: Secondary | ICD-10-CM

## 2018-02-08 MED ORDER — FLUCONAZOLE 150 MG PO TABS: 150.0000 mg | ORAL_TABLET | ORAL | 0 refills | Status: DC

## 2018-02-08 NOTE — Patient Instructions (Signed)
Ask pharmacist about GoodRx coupon.

## 2018-02-11 DIAGNOSIS — G4733 Obstructive sleep apnea (adult) (pediatric): Secondary | ICD-10-CM | POA: Diagnosis not present

## 2018-03-03 NOTE — Progress Notes (Signed)
  Subjective:  Patient ID: Randy [name], male    DOB: 07/22/1940,  MRN: [id number]  Chief Complaint  Patient presents with  . Diabetes    6 month diabetic foot exam/ debride    77 y.o. male presents  for diabetic foot care. Last AMBS was unknown.  Would like to discuss further treatment for her toenails Denies numbness and tingling in their feet.   cramping in legs and thighs.  Review of Systems: Negative except as noted in the HPI. Denies N/V/F/Ch.  Past Medical History:  Diagnosis Date  . Diabetes mellitus   . Hypertension   . OSA (obstructive sleep apnea) 11/24/2016    Current Outpatient Medications:  .  Blood Glucose Calibration (TRUE METRIX LEVEL 1) Low SOLN, USE TO CHECK METER PRIOR TO TESTING SUGAR, Disp: 3 each, Rfl: 0 .  Blood Glucose Monitoring Suppl (TRUE METRIX AIR GLUCOSE METER) w/Device KIT, 1 each by Does not apply route daily., Disp: 1 kit, Rfl: 0 .  fluconazole (DIFLUCAN) 150 MG tablet, Take 1 tablet (150 mg total) by mouth once a week., Disp: 3 tablet, Rfl: 0 .  glucose blood test strip, USE TO CHECK BLOOD SUGAR DAILY AND PRN, Disp: 300 each, Rfl: 0 .  ketoconazole (NIZORAL) 2 % cream, Apply 1 fingertip amount to each foot twice daily., Disp: 30 g, Rfl: 0 .  lisinopril-hydrochlorothiazide (PRINZIDE,ZESTORETIC) 20-12.5 MG tablet, TAKE ONE TABLET BY MOUTH DAILY, Disp: 90 tablet, Rfl: 0 .  metFORMIN (GLUCOPHAGE) 1000 MG tablet, Take 1 tablet (1,000 mg total) by mouth daily with breakfast., Disp: 90 tablet, Rfl: 1 .  TRUEPLUS LANCETS 33G MISC, USE TO CHECK BLOOD SUGAR DAILY AND PRN, Disp: 300 each, Rfl: 0  Social History   Tobacco Use  Smoking Status Former Smoker  . Packs/day: 1.00  . Years: 30.00  . Pack years: 30.00  . Types: Cigarettes  . Last attempt to quit: 07/03/2001  . Years since quitting: 16.6  Smokeless Tobacco Former Systems developer  . Types: Chew  . Quit date: 07/04/1991    No Known Allergies Objective:  There were no vitals filed for this  visit. There is no height or weight on file to calculate BMI. Constitutional Well developed. Well nourished.  Vascular Dorsalis pedis pulses present 1+ bilaterally  Posterior tibial pulses absent bilaterally  Pedal hair growth diminished. Capillary refill normal to all digits.  No cyanosis or clubbing noted.  Neurologic Normal speech. Oriented to person, place, and time. Epicritic sensation to light touch grossly present bilaterally. Protective sensation with 5.07 monofilament  present bilaterally. Vibratory sensation present bilaterally.  Dermatologic Nails elongated thickened dystrophic. No open wounds. No skin lesions.  Orthopedic: Normal joint ROM without pain or crepitus bilaterally. No visible deformities. No bony tenderness.   Assessment:   1. Encounter for diabetic foot exam ([place])   2. Onychomycosis   3. Diabetes mellitus type 2 with peripheral artery disease ([place])    Plan:  Patient was evaluated and treated and all questions answered.  Diabetes with PAD, Onychomycosis -Educated on diabetic footcare. Diabetic risk level 1 -Nails x10 debrided sharply and manually with large nail nipper and rotary burr.  -Rx oral fluconazole.  Educated on risk medicine medication and proper taking medication.  Procedure: Nail Debridement Rationale: Patient meets criteria for routine foot care due to PAD Type of Debridement: manual, sharp debridement. Instrumentation: Nail nipper, rotary burr. Number of Nails: 10  Return in about 6 months (around 08/11/2018) for Diabetic Foot Care.

## 2018-03-14 DIAGNOSIS — G4733 Obstructive sleep apnea (adult) (pediatric): Secondary | ICD-10-CM | POA: Diagnosis not present

## 2018-03-19 ENCOUNTER — Telehealth: Payer: Self-pay | Admitting: Pulmonary Disease

## 2018-03-19 NOTE — Telephone Encounter (Signed)
Patient has several questions regarding his cpap, his nose runs after using the cpap every night and would like to know what he can about this, would like to know whether he will have to wear his cpap forever or is it something he can eventually come off it, and he also would also like to know why he is sleeping so long. Advised patient MD may require that patient come in for visit as it has been a year since LOV.   VS please advise. LOV 03/21/19

## 2018-03-20 NOTE — Telephone Encounter (Signed)
Please schedule ROV with me or NP to discuss these issues in more detail.

## 2018-03-21 NOTE — Telephone Encounter (Signed)
Called and spoke with patient he has been scheduled. Nothing further needed. 

## 2018-03-23 ENCOUNTER — Encounter: Payer: Self-pay | Admitting: Pulmonary Disease

## 2018-03-23 ENCOUNTER — Ambulatory Visit (INDEPENDENT_AMBULATORY_CARE_PROVIDER_SITE_OTHER): Payer: Medicare PPO | Admitting: Pulmonary Disease

## 2018-03-23 VITALS — BP 116/68 | HR 65 | Ht 68.0 in | Wt 174.2 lb

## 2018-03-23 DIAGNOSIS — G4733 Obstructive sleep apnea (adult) (pediatric): Secondary | ICD-10-CM | POA: Diagnosis not present

## 2018-03-23 DIAGNOSIS — G4734 Idiopathic sleep related nonobstructive alveolar hypoventilation: Secondary | ICD-10-CM

## 2018-03-23 NOTE — Progress Notes (Signed)
[facility]  Chief Complaint  Patient presents with  . Follow-up    OSA, feels CPAP is working well, but feels mask fits to tight    Constitutional: BP 116/68 (BP Location: Left Arm, Cuff Size: Normal)   Pulse 65   Ht _0  (1.727 m)   Wt 174 lb 3.2 oz (79 kg)   SpO2 96%   BMI 26.49 kg/m   History of Present Illness: Randy [name] is a 77 y.o. male with obstructive sleep apnea, and sleep related hypoxemia.  He uses CPAP nightly with 2 liters oxygen.  Has trouble with marks on his face from mask and runny nose.  Pressure is okay.  Not having sore throat, dry mouth, or aerophagia.  Mask sits up around his eyes and get crusting around his eyes.  His oxygen is loud and causes trouble with his wife sleeping.    Comprehensive Respiratory Exam:  Appearance - well kempt  ENMT - nasal mucosa moist, turbinates clear, midline nasal septum, no dental lesions, no gingival bleeding, no oral exudates, no tonsillar hypertrophy Neck - no masses, trachea midline, no thyromegaly, no elevation in JVP Respiratory - normal appearance of chest wall, normal respiratory effort w/o accessory muscle use, no dullness on percussion, no wheezing or rales CV - s1s2 regular rate and rhythm, no murmurs, no peripheral edema, radial pulses symmetric GI - soft, non tender, no masses Lymph - no adenopathy noted in neck and axillary areas MSK - normal muscle strength and tone, normal gait Ext - no cyanosis, clubbing, or joint inflammation noted Skin - no rashes, lesions, or ulcers Neuro - oriented to person, place, and time Psych - normal mood and affect   Assessment/Plan:  Obstructive sleep apnea. - he is compliant with CPAP and reports benefit - continue CAP 8 cm H2O  CPAP rhinitis. - he can try using flonase and nasal irrigation  Trouble with full face CPAP mask. - reviewed techniques to help with mask fit - he can look up alternative masks on line  Sleep  related hypoxia. - he is concerned about how loud home oxygen set up is - will arrange for repeat ONO with CPAP - continue 2 liters at night with CPAP for now - might need repeat in lab titration study to determine if he needs adjustments to CPAP or he needs to change to Bipap  Spent 28 minutes.  Patient Instructions  Can look up CPAP mask options on line.  Will arrange for overnight oxygen test with you using CPAP.  Do not use your oxygen on the night of the oxygen test at home.  Can try using flonase nasal spray, 1 spray in each nostril at night as needed if your are having sinus congestion and runny nose from using CPAP  Follow up in 1 year    Chesley Mires, MD [place] 03/23/2018, 11:11 AM  Flow Sheet  Sleep tests: HST 11/22/16 >> AHI 80.7, SaO2 low 52% CPAP titration 12/21/16 >> CPAP 14 cm H2O with 2 liters oxygen. ONO with CPAP and 2 liters 10/30/17 >> Test time 9 hrs 4 min.  Basal SpO2 95.5%, low SpO2 77%.  Spent 14 min with SpO2 < 88%. CPAP 02/20/18 to 9/118/19 > used on 30 of 30 nights with average 9 hrs 40 min.  Average AHI 1.4 with CPAP 8 cm H2O.  Past Medical History: He  has a past medical history of Diabetes mellitus, Hypertension, and OSA (obstructive sleep apnea) (  11/24/2016).  Past Surgical History: He  has a past surgical history that includes Esophagogastroduodenoscopy (07/05/2011) and Colonoscopy (07/06/2011).  Family History: His family history includes Brain cancer in his mother.  Social History: He  reports that he quit smoking about 16 years ago. His smoking use included cigarettes. He has a 30.00 pack-year smoking history. He quit smokeless tobacco use about 26 years ago.  His smokeless tobacco use included chew. He reports that he drinks about 3.0 standard drinks of alcohol per week. He reports that he does not use drugs.  Medications: Allergies as of 03/23/2018   No Known Allergies     Medication List        Accurate as of 03/23/18  11:11 AM. Always use your most recent med list.          glucose blood test strip USE TO CHECK BLOOD SUGAR DAILY AND PRN   ketoconazole 2 % cream Commonly known as:  NIZORAL Apply 1 fingertip amount to each foot twice daily.   lisinopril-hydrochlorothiazide 20-12.5 MG tablet Commonly known as:  PRINZIDE,ZESTORETIC TAKE ONE TABLET BY MOUTH DAILY   metFORMIN 1000 MG tablet Commonly known as:  GLUCOPHAGE Take 1 tablet (1,000 mg total) by mouth daily with breakfast.   TRUE METRIX AIR GLUCOSE METER w/Device Kit 1 each by Does not apply route daily.   TRUE METRIX LEVEL 1 Low Soln USE TO CHECK METER PRIOR TO TESTING SUGAR   TRUEPLUS LANCETS 33G Misc USE TO CHECK BLOOD SUGAR DAILY AND PRN

## 2018-03-23 NOTE — Patient Instructions (Signed)
Can look up CPAP mask options on line.  Will arrange for overnight oxygen test with you using CPAP.  Do not use your oxygen on the night of the oxygen test at home.  Can try using flonase nasal spray, 1 spray in each nostril at night as needed if your are having sinus congestion and runny nose from using CPAP  Follow up in 1 year

## 2018-03-29 DIAGNOSIS — G4733 Obstructive sleep apnea (adult) (pediatric): Secondary | ICD-10-CM | POA: Diagnosis not present

## 2018-04-02 NOTE — Telephone Encounter (Signed)
Dr. Halford Chessman, please advise. ONO in the media tab to review.

## 2018-04-03 DIAGNOSIS — G4733 Obstructive sleep apnea (adult) (pediatric): Secondary | ICD-10-CM | POA: Diagnosis not present

## 2018-04-10 ENCOUNTER — Telehealth: Payer: Self-pay | Admitting: Pulmonary Disease

## 2018-04-10 ENCOUNTER — Encounter: Payer: Self-pay | Admitting: Adult Health

## 2018-04-10 ENCOUNTER — Ambulatory Visit (INDEPENDENT_AMBULATORY_CARE_PROVIDER_SITE_OTHER): Payer: Medicare PPO | Admitting: Adult Health

## 2018-04-10 VITALS — BP 122/58 | HR 81 | Temp 97.7°F | Wt 171.6 lb

## 2018-04-10 DIAGNOSIS — I10 Essential (primary) hypertension: Secondary | ICD-10-CM | POA: Diagnosis not present

## 2018-04-10 DIAGNOSIS — Z23 Encounter for immunization: Secondary | ICD-10-CM

## 2018-04-10 DIAGNOSIS — Z7689 Persons encountering health services in other specified circumstances: Secondary | ICD-10-CM

## 2018-04-10 DIAGNOSIS — G4733 Obstructive sleep apnea (adult) (pediatric): Secondary | ICD-10-CM

## 2018-04-10 DIAGNOSIS — E1100 Type 2 diabetes mellitus with hyperosmolarity without nonketotic hyperglycemic-hyperosmolar coma (NKHHC): Secondary | ICD-10-CM | POA: Diagnosis not present

## 2018-04-10 MED ORDER — LISINOPRIL-HYDROCHLOROTHIAZIDE 20-12.5 MG PO TABS: 1.0000 | ORAL_TABLET | Freq: Every day | ORAL | 2 refills | Status: DC

## 2018-04-10 MED ORDER — METFORMIN HCL 1000 MG PO TABS: 1000.0000 mg | ORAL_TABLET | Freq: Every day | ORAL | 2 refills | Status: DC

## 2018-04-10 NOTE — Progress Notes (Signed)
Patient presents to clinic today to establish care. He is a pleasant 77 year old male who  has a past medical history of Diabetes mellitus, Hypertension, and OSA (obstructive sleep apnea) (11/24/2016).  He is a former patient of Dr. Raliegh Ip who is transferring care. His last CPE was in 11/2017    Acute Concerns: Establish Care   Chronic Issues: OSA - followed by Pulmonary. Wears CPAP. Feels as though this has been beneficial and is no longer fatigued during the day.   DM - Controlled with Metformin 1000 mg daily.  Lab Results  Component Value Date   HGBA1C 6.0 11/07/2017   Essential Hypertension - Controlled with lisinopril/HCTZ 20-12.5 mg  BP Readings from Last 3 Encounters:  04/10/18 (!) 122/58  03/23/18 116/68  11/07/17 110/62    Health Maintenance: Dental -- Routine Care Vision -- Routine Care Immunizations -- Needs flu vaccination  Colonoscopy -- 2013 Diet: He tries to eat healthy.  Exercise: He walks 30-40 minutes daily and some weight training at home.   Past Medical History:  Diagnosis Date  . Diabetes mellitus   . Hypertension   . OSA (obstructive sleep apnea) 11/24/2016    Past Surgical History:  Procedure Laterality Date  . COLONOSCOPY  07/06/2011   Procedure: COLONOSCOPY;  Surgeon: Winfield Cunas., MD;  Location: [facility] ENDOSCOPY;  Service: Endoscopy;  Laterality: N/A;  . ESOPHAGOGASTRODUODENOSCOPY  07/05/2011   Procedure: ESOPHAGOGASTRODUODENOSCOPY (EGD);  Surgeon: Landry Dyke, MD;  Location: [facility] ENDOSCOPY;  Service: Endoscopy;  Laterality: N/A;    Current Outpatient Medications on File Prior to Visit  Medication Sig Dispense Refill  . Blood Glucose Calibration (TRUE METRIX LEVEL 1) Low SOLN USE TO CHECK METER PRIOR TO TESTING SUGAR 3 each 0  . Blood Glucose Monitoring Suppl (TRUE METRIX AIR GLUCOSE METER) w/Device KIT 1 each by Does not apply route daily. 1 kit 0  . glucose blood test strip USE TO CHECK BLOOD SUGAR DAILY AND PRN 300 each 0  . ketoconazole  (NIZORAL) 2 % cream Apply 1 fingertip amount to each foot twice daily. 30 g 0  . lisinopril-hydrochlorothiazide (PRINZIDE,ZESTORETIC) 20-12.5 MG tablet TAKE ONE TABLET BY MOUTH DAILY 90 tablet 0  . metFORMIN (GLUCOPHAGE) 1000 MG tablet Take 1 tablet (1,000 mg total) by mouth daily with breakfast. 90 tablet 1  . TRUEPLUS LANCETS 33G MISC USE TO CHECK BLOOD SUGAR DAILY AND PRN 300 each 0   No current facility-administered medications on file prior to visit.     No Known Allergies  Family History  Problem Relation Age of Onset  . Brain cancer Mother     Social History   Socioeconomic History  . Marital status: Married    Spouse name: Not on file  . Number of children: Not on file  . Years of education: Not on file  . Highest education level: Not on file  Occupational History  . Occupation: retired  Scientific laboratory technician  . Financial resource strain: Not on file  . Food insecurity:    Worry: Not on file    Inability: Not on file  . Transportation needs:    Medical: Not on file    Non-medical: Not on file  Tobacco Use  . Smoking status: Former Smoker    Packs/day: 1.00    Years: 30.00    Pack years: 30.00    Types: Cigarettes    Last attempt to quit: 07/03/2001    Years since quitting: 16.7  . Smokeless tobacco: Former Systems developer  Types: [place] Ser    Quit date: 07/04/1991  Substance and Sexual Activity  . Alcohol use: Yes    Alcohol/week: 3.0 standard drinks    Types: 3 Glasses of wine per week    Comment: daily alcohol wine with dinner  . Drug use: No  . Sexual activity: Yes  Lifestyle  . Physical activity:    Days per week: Not on file    Minutes per session: Not on file  . Stress: Not on file  Relationships  . Social connections:    Talks on phone: Not on file    Gets together: Not on file    Attends religious service: Not on file    Active member of club or organization: Not on file    Attends meetings of clubs or organizations: Not on file    Relationship status: Not on  file  . Intimate partner violence:    Fear of current or ex partner: Not on file    Emotionally abused: Not on file    Physically abused: Not on file    Forced sexual activity: Not on file  Other Topics Concern  . Not on file  Social History Narrative  . Not on file    Review of Systems  Constitutional: Negative.   HENT: Negative.   Eyes: Negative.   Respiratory: Negative.   Cardiovascular: Negative.   Gastrointestinal: Negative.   Genitourinary: Negative.   Musculoskeletal: Negative.   Neurological: Negative.   Psychiatric/Behavioral: Negative.      BP (!) 122/58 (BP Location: Left Arm, Patient Position: Sitting, Cuff Size: Normal)   Pulse 81   Temp 97.7 F (36.5 C) (Oral)   Wt 171 lb 9.6 oz (77.8 kg)   SpO2 92%   BMI 26.09 kg/m   Physical Exam  Constitutional: He is oriented to person, place, and time. He appears well-developed and well-nourished. No distress.  Cardiovascular: Normal rate, regular rhythm, normal heart sounds and intact distal pulses.  Pulmonary/Chest: Effort normal and breath sounds normal.  Neurological: He is alert and oriented to person, place, and time.  Skin: Skin is warm and dry. He is not diaphoretic.  Psychiatric: He has a normal mood and affect. His behavior is normal. Judgment and thought content normal.  Nursing note and vitals reviewed.  Assessment/Plan: 1. Encounter to establish care - Follow up in May 2020 for CPE or sooner if needed - Continue to eat healthy and exercise    2. Type 2 diabetes mellitus with hyperosmolarity without coma, without long-term current use of insulin ([place]) - Well controlled. No change   3. Essential hypertension - Well controlled. No change   4. OSA (obstructive sleep apnea) - Continue with Cpap. Follow up with Pulmonary as directed   5. Need for influenza vaccination  - Flu vaccine HIGH DOSE PF (Fluzone High dose)   Dorothyann Peng, NP

## 2018-04-10 NOTE — Patient Instructions (Addendum)
It was great meeting you today and I am glad to have you on the team   You received your flu shot today   Medications were sent in   I will see you in May 2020 for your physical

## 2018-04-10 NOTE — Telephone Encounter (Signed)
ONO with CPAP 03/28/18 >> test time 7 hrs 52 min.  Basal SpO2 90.1%, low SpO2 67%.  Spent 59.7 min with SpO2 < 88%.   Please let him know that his oxygen is still low at night with CPAP if he doesn't use supplemental oxygen.  Options are 1) resume using 2 liters oxygen at night with CPAP, or 2) arrange for in lab CPAP titration to determine if he needs adjustment to CPAP settings or transition to Bipap in place of CPAP.

## 2018-04-12 NOTE — Telephone Encounter (Signed)
Called and spoke with patient regarding results.  Informed the patient of results and recommendations today. VS is requesting in lab CPAP titration to determine if he needs adjustment to CPAP settings  or transition to Bipap in place of CPAP.  Pt has many concerns regarding these results. Pt advised how would O2 at night help him during the daytime Pt states he feels rested in the mornings, and doesn't feel he needs O2 at night, nor wants to complete inlab study He is confused of how the O2 was low when he didn't use O2 during the test. Pt is requesting a call from VS regarding above concerns.  VS please advise.

## 2018-04-13 DIAGNOSIS — G4733 Obstructive sleep apnea (adult) (pediatric): Secondary | ICD-10-CM | POA: Diagnosis not present

## 2018-04-13 NOTE — Telephone Encounter (Signed)
He needs to come for an ROV to discuss in more detail

## 2018-04-24 NOTE — Telephone Encounter (Signed)
Called and spoke with pt today regarding last telephone note below. Pt is requesting to talk with VS about O2 at night Scheduled appt for 04-25-18 at 3:15pm Pt is willing to come in, but doesn't want to pay a co-pay for ov. Since he was here a few weeks ago, and just had a quest regarding the tests. Advised pt that I would speak to VS regarding this matter.  VS please advise.

## 2018-04-24 NOTE — Telephone Encounter (Signed)
Received a MyChart message from patient today following up on why he needs O2 at night. I explained to patient the following message:   "ONO with CPAP 03/28/18 >> test time 7 hrs 52 min.  Basal SpO2 90.1%, low SpO2 67%.  Spent 59.7 min with SpO2 < 88%.   Please let him know that his oxygen is still low at night with CPAP if he doesn't use supplemental oxygen.  Options are 1) resume using 2 liters oxygen at night with CPAP, or 2) arrange for in lab CPAP titration to determine if he needs adjustment to CPAP settings or transition to Bipap in place of CPAP."  Patient is still asking how could his O2 drop at night when his O2 is normal during the day. He does not like to use the oxygen concentrator at night because it keeps his wife awake due to the noise. I advised him that it would be best to come in for an OV to discuss this further but he declined an appt due to the price of a copay and the fact that he was just here last month.   Dr. Halford Chessman, please advise on what could be causing his O2 to drop at night. Thanks!

## 2018-04-25 ENCOUNTER — Ambulatory Visit: Payer: Medicare PPO | Admitting: Pulmonary Disease

## 2018-04-25 NOTE — Telephone Encounter (Signed)
Please let him know that it is okay for him to just continue using CPAP and 2 liters at night.  He doesn't need to have repeat sleep study and he doesn't need to have office visit at this time.

## 2018-04-25 NOTE — Telephone Encounter (Signed)
Called and spoke with pt, advised him of VS recommendations below. Pt verbalized understanding, and advised to cancel today's appt. Cancelled appt, nothing further needed.

## 2018-05-08 ENCOUNTER — Telehealth: Payer: Self-pay | Admitting: Pulmonary Disease

## 2018-05-08 NOTE — Telephone Encounter (Signed)
Called and spoke to pt. Pt is requesting to switch providers, due communication issues. Pt is aware of office protocol.   VS and OA please advise if okay for pt to switch. Thanks

## 2018-05-09 NOTE — Telephone Encounter (Signed)
Noted by triage.  Will await OA response.

## 2018-05-09 NOTE — Telephone Encounter (Signed)
Okay with me 

## 2018-05-11 NOTE — Telephone Encounter (Signed)
OL- please advise if you are okay with taking on this patient in the future. Thank you.

## 2018-05-14 DIAGNOSIS — G4733 Obstructive sleep apnea (adult) (pediatric): Secondary | ICD-10-CM | POA: Diagnosis not present

## 2018-05-18 NOTE — Telephone Encounter (Signed)
Called and spoke with pt letting him know that VS was fine with him switching providers but we were still awaiting from AO if it would be okay with [place] scheduling pt to see him now.  Stated to pt once we hear from AO we would call him to get his next OV scheduled.  Dr. Ander Slade, please advise if you are okay taking over pt's care. Thanks!

## 2018-05-22 NOTE — Telephone Encounter (Signed)
Dr. Ander Slade, please advise if we can schedule pt an appt to see you as he is wanting to switch providers from VS to you. VS is okay with the switch and before we can schedule pt with you, we have to get the okay from you as well. Pt's appt will be a 65min visit due to it being the first time he will have seen you at office.

## 2018-05-28 NOTE — Telephone Encounter (Signed)
Patient has been scheduled with AO. Nothing further needed.

## 2018-05-28 NOTE — Telephone Encounter (Signed)
Dr. Ander Slade, please advise on this for pt. Thanks!

## 2018-05-28 NOTE — Telephone Encounter (Signed)
Okay with me 

## 2018-06-13 DIAGNOSIS — G4733 Obstructive sleep apnea (adult) (pediatric): Secondary | ICD-10-CM | POA: Diagnosis not present

## 2018-06-15 ENCOUNTER — Encounter: Payer: Self-pay | Admitting: Pulmonary Disease

## 2018-06-15 ENCOUNTER — Ambulatory Visit (INDEPENDENT_AMBULATORY_CARE_PROVIDER_SITE_OTHER): Payer: Medicare PPO | Admitting: Pulmonary Disease

## 2018-06-15 VITALS — BP 140/82 | HR 85 | Ht 68.0 in | Wt 177.8 lb

## 2018-06-15 DIAGNOSIS — G4733 Obstructive sleep apnea (adult) (pediatric): Secondary | ICD-10-CM | POA: Diagnosis not present

## 2018-06-15 NOTE — Patient Instructions (Signed)
We will reduce your pressure to 7 from 8 We will try and get a download from the machine in about 2 to 3 weeks to ensure that we are still achieving an adequate treatment of the sleep apnea Apply the straps a little looser than what you are doing currently  Call us if you are not tolerating the new pressure  I will see you back in the office in about 3 months

## 2018-06-15 NOTE — Progress Notes (Signed)
Randy [name]    [id number]    [birth year]  Primary Care Physician:Nafziger, Tommi Rumps, NP  Referring Physician: Dorothyann Peng, NP [place], [place] [address]  Chief complaint:   History of obstructive sleep apnea on CPAP  HPI:  He was following up with Dr. Halford Chessman  The machine hisses a lot, some leaking  Has to pull the straps tightly currently, lives a lot of marks on his face  He has a history of obstructive sleep apnea and sleep-related hypoxemia Uses CPAP currently with 2 L of oxygen  HST 11/22/16 >> AHI 80.7, SaO2 low 52% CPAP titration 12/21/16 >> CPAP 14 cm H2O with 2 liters oxygen. ONO with CPAP and 2 liters 10/30/17 >>Test time 9 hrs 4 min. Basal SpO2 95.5%, low SpO2 77%. Spent 14 min with SpO2 <88%. CPAP 02/20/18 to 9/118/19 > used on 30 of 30 nights with average 9 hrs 40 min.  Average AHI 1.4 with CPAP 8 cm H2O.  Was an Chief Financial Officer Reformed smoker Medical history significant for diabetes, hypertension,  Outpatient Encounter Medications as of 06/15/2018  Medication Sig  . Blood Glucose Calibration (TRUE METRIX LEVEL 1) Low SOLN USE TO CHECK METER PRIOR TO TESTING SUGAR  . Blood Glucose Monitoring Suppl (TRUE METRIX AIR GLUCOSE METER) w/Device KIT 1 each by Does not apply route daily.  Marland Kitchen glucose blood test strip USE TO CHECK BLOOD SUGAR DAILY AND PRN  . lisinopril-hydrochlorothiazide (PRINZIDE,ZESTORETIC) 20-12.5 MG tablet Take 1 tablet by mouth daily.  . metFORMIN (GLUCOPHAGE) 1000 MG tablet Take 1 tablet (1,000 mg total) by mouth daily with breakfast.  . TRUEPLUS LANCETS 33G MISC USE TO CHECK BLOOD SUGAR DAILY AND PRN  . ketoconazole (NIZORAL) 2 % cream Apply 1 fingertip amount to each foot twice daily. (Patient not taking: Reported on 06/15/2018)   No facility-administered encounter medications on file as of 06/15/2018.     Allergies as of 06/15/2018  . (No Known Allergies)    Past Medical History:  Diagnosis Date  . Diabetes  mellitus   . Hypertension   . OSA (obstructive sleep apnea) 11/24/2016    Past Surgical History:  Procedure Laterality Date  . CATARACT EXTRACTION, BILATERAL Bilateral 2019  . COLONOSCOPY  07/06/2011   Procedure: COLONOSCOPY;  Surgeon: Winfield Cunas., MD;  Location: [facility] ENDOSCOPY;  Service: Endoscopy;  Laterality: N/A;  . ESOPHAGOGASTRODUODENOSCOPY  07/05/2011   Procedure: ESOPHAGOGASTRODUODENOSCOPY (EGD);  Surgeon: Landry Dyke, MD;  Location: [facility] ENDOSCOPY;  Service: Endoscopy;  Laterality: N/A;    Family History  Problem Relation Age of Onset  . Brain cancer Mother   . Hypertension Father   . Diabetes Father   . Stroke Father     Social History   Socioeconomic History  . Marital status: Married    Spouse name: Not on file  . Number of children: Not on file  . Years of education: Not on file  . Highest education level: Not on file  Occupational History  . Occupation: retired  Scientific laboratory technician  . Financial resource strain: Not on file  . Food insecurity:    Worry: Not on file    Inability: Not on file  . Transportation needs:    Medical: Not on file    Non-medical: Not on file  Tobacco Use  . Smoking status: Former Smoker    Packs/day: 1.00    Years: 30.00    Pack years: 30.00    Types: Cigarettes  Last attempt to quit: 07/03/2001    Years since quitting: 16.9  . Smokeless tobacco: Former Systems developer    Types: [place] date: 07/04/1991  Substance and Sexual Activity  . Alcohol use: Yes    Alcohol/week: 3.0 standard drinks    Types: 3 Glasses of wine per week    Comment: daily alcohol wine with dinner  . Drug use: No  . Sexual activity: Yes  Lifestyle  . Physical activity:    Days per week: Not on file    Minutes per session: Not on file  . Stress: Not on file  Relationships  . Social connections:    Talks on phone: Not on file    Gets together: Not on file    Attends religious service: Not on file    Active member of club or organization: Not on file      Attends meetings of clubs or organizations: Not on file    Relationship status: Not on file  . Intimate partner violence:    Fear of current or ex partner: Not on file    Emotionally abused: Not on file    Physically abused: Not on file    Forced sexual activity: Not on file  Other Topics Concern  . Not on file  Social History Narrative   Retired - Management consultant plants    Married       He likes to play golf. He likes to read and work in the yard.        Review of Systems  Constitutional: Negative.   HENT: Positive for congestion.   Eyes: Negative.   Respiratory: Negative.   Cardiovascular: Negative.   Gastrointestinal: Negative.     Vitals:   06/15/18 0956  BP: 140/82  Pulse: 85  SpO2: 97%     Physical Exam  Constitutional: He appears well-developed and well-nourished.  HENT:  Head: Normocephalic and atraumatic.  Eyes: Pupils are equal, round, and reactive to light. Conjunctivae and EOM are normal. Right eye exhibits no discharge. Left eye exhibits no discharge.  Neck: Normal range of motion. Neck supple. No tracheal deviation present. No thyromegaly present.  Cardiovascular: Normal rate and regular rhythm.  Pulmonary/Chest: Effort normal and breath sounds normal. No respiratory distress. He has no wheezes. He has no rales.  Abdominal: Soft. Bowel sounds are normal.    Results of the [place] flowsheet 06/15/2018 10/27/2016  Sitting and reading 2 3  Watching TV 2 3  Sitting, inactive in a public place (e.g. a theatre or a meeting) 2 2  As a passenger in a car for an hour without a break 2 2  Lying down to rest in the afternoon when circumstances permit 1 2  Sitting and talking to someone 1 1  Sitting quietly after a lunch without alcohol 2 2  In a car, while stopped for a few minutes in traffic 0 2  Total score 12 17    Data Reviewed: Compliance data reveals 100% compliance On CPAP of 8 residual AHI of 1 Oximetry on CPAP and 2 L did show minimal  desaturations probably during REM sleep His previous sleep studies were reviewed  Assessment:  Obstructive sleep apnea adequately treated at present time patient requests a decrease in pressure as he feels the pressure is too high and that is what makes the hissing sound at night Has no other significant concerns with CPAP use Nasal stuffiness and congestion-Flonase use-on a regular basis discussed  Plan/Recommendations:  We will  reduce pressure to 7 from 8 Use of Flonase on a regular basis discussed We will obtain a download from his machine in about 2 to 3 weeks Continue using oxygen supplementation with CPAP  Encouraged to call with any significant concerns importance of regular exercise discussed with the patient  I will see him back in the office in about 3 months     Sherrilyn Rist MD [facility] 06/15/2018, 10:02 AM  CC: Dorothyann Peng, NP

## 2018-06-20 ENCOUNTER — Telehealth: Payer: Self-pay | Admitting: Pulmonary Disease

## 2018-06-20 NOTE — Telephone Encounter (Signed)
Verified the change with Maudie Mercury nothing further needed at this time.

## 2018-07-04 DIAGNOSIS — U071 COVID-19: Secondary | ICD-10-CM

## 2018-07-04 HISTORY — DX: COVID-19: U07.1

## 2018-07-14 DIAGNOSIS — G4733 Obstructive sleep apnea (adult) (pediatric): Secondary | ICD-10-CM | POA: Diagnosis not present

## 2018-08-14 DIAGNOSIS — G4733 Obstructive sleep apnea (adult) (pediatric): Secondary | ICD-10-CM | POA: Diagnosis not present

## 2018-09-12 DIAGNOSIS — G4733 Obstructive sleep apnea (adult) (pediatric): Secondary | ICD-10-CM | POA: Diagnosis not present

## 2018-10-13 DIAGNOSIS — G4733 Obstructive sleep apnea (adult) (pediatric): Secondary | ICD-10-CM | POA: Diagnosis not present

## 2018-11-14 ENCOUNTER — Encounter: Payer: Medicare PPO | Admitting: Adult Health

## 2018-12-05 ENCOUNTER — Encounter: Payer: Self-pay | Admitting: Adult Health

## 2018-12-07 ENCOUNTER — Ambulatory Visit (INDEPENDENT_AMBULATORY_CARE_PROVIDER_SITE_OTHER): Payer: Medicare PPO | Admitting: Adult Health

## 2018-12-07 ENCOUNTER — Other Ambulatory Visit: Payer: Self-pay

## 2018-12-07 ENCOUNTER — Encounter: Payer: Self-pay | Admitting: Adult Health

## 2018-12-07 VITALS — BP 120/76 | HR 69 | Temp 98.2°F | Ht 68.0 in | Wt 172.0 lb

## 2018-12-07 DIAGNOSIS — R2681 Unsteadiness on feet: Secondary | ICD-10-CM

## 2018-12-07 DIAGNOSIS — G4733 Obstructive sleep apnea (adult) (pediatric): Secondary | ICD-10-CM

## 2018-12-07 DIAGNOSIS — E119 Type 2 diabetes mellitus without complications: Secondary | ICD-10-CM | POA: Diagnosis not present

## 2018-12-07 DIAGNOSIS — I10 Essential (primary) hypertension: Secondary | ICD-10-CM | POA: Diagnosis not present

## 2018-12-07 DIAGNOSIS — Z0000 Encounter for general adult medical examination without abnormal findings: Secondary | ICD-10-CM

## 2018-12-07 DIAGNOSIS — R42 Dizziness and giddiness: Secondary | ICD-10-CM | POA: Diagnosis not present

## 2018-12-07 LAB — COMPREHENSIVE METABOLIC PANEL
ALT: 11 U/L (ref 0–53)
AST: 10 U/L (ref 0–37)
Albumin: 4.4 g/dL (ref 3.5–5.2)
Alkaline Phosphatase: 48 U/L (ref 39–117)
BUN: 20 mg/dL (ref 6–23)
CO2: 35 mEq/L — ABNORMAL HIGH (ref 19–32)
Calcium: 11 mg/dL — ABNORMAL HIGH (ref 8.4–10.5)
Chloride: 97 mEq/L (ref 96–112)
Creatinine, Ser: 1.13 mg/dL (ref 0.40–1.50)
GFR: 62.72 mL/min (ref >60.00)
Glucose, Bld: 98 mg/dL (ref 70–99)
Potassium: 5.3 mEq/L — ABNORMAL HIGH (ref 3.5–5.1)
Sodium: 139 mEq/L (ref 135–145)
Total Bilirubin: 0.5 mg/dL (ref 0.2–1.2)
Total Protein: 6.5 g/dL (ref 6.0–8.3)

## 2018-12-07 LAB — LIPID PANEL
Cholesterol: 181 mg/dL (ref 0–200)
HDL: 69.6 mg/dL (ref >39.00)
LDL Cholesterol: 101 mg/dL — ABNORMAL HIGH (ref 0–99)
NonHDL: 111.54
Total CHOL/HDL Ratio: 3
Triglycerides: 54 mg/dL (ref 0.0–149.0)
VLDL: 10.8 mg/dL (ref 0.0–40.0)

## 2018-12-07 LAB — POCT URINALYSIS DIPSTICK
Bilirubin, UA: NEGATIVE
Blood, UA: NEGATIVE
Glucose, UA: NEGATIVE
Ketones, UA: NEGATIVE
Leukocytes, UA: NEGATIVE
Nitrite, UA: NEGATIVE
Protein, UA: NEGATIVE
Spec Grav, UA: 1.025 (ref 1.010–1.025)
Urobilinogen, UA: 0.2 E.U./dL
pH, UA: 5.5 (ref 5.0–8.0)

## 2018-12-07 LAB — CBC WITH DIFFERENTIAL/PLATELET
Basophils Absolute: 0 10*3/uL (ref 0.0–0.1)
Basophils Relative: 0.3 % (ref 0.0–3.0)
Eosinophils Absolute: 0.3 10*3/uL (ref 0.0–0.7)
Eosinophils Relative: 4.1 % (ref 0.0–5.0)
HCT: 42.1 % (ref 39.0–52.0)
Hemoglobin: 14.1 g/dL (ref 13.0–17.0)
Lymphocytes Relative: 23.7 % (ref 12.0–46.0)
Lymphs Abs: 1.8 10*3/uL (ref 0.7–4.0)
MCHC: 33.6 g/dL (ref 30.0–36.0)
MCV: 97.7 fl (ref 78.0–100.0)
Monocytes Absolute: 0.7 10*3/uL (ref 0.1–1.0)
Monocytes Relative: 9.7 % (ref 3.0–12.0)
Neutro Abs: 4.7 10*3/uL (ref 1.4–7.7)
Neutrophils Relative %: 62.2 % (ref 43.0–77.0)
Platelets: 203 10*3/uL (ref 150.0–400.0)
RBC: 4.31 Mil/uL (ref 4.22–5.81)
RDW: 13.1 % (ref 11.5–15.5)
WBC: 7.6 10*3/uL (ref 4.0–10.5)

## 2018-12-07 LAB — TSH: TSH: 1.37 u[IU]/mL (ref 0.35–4.50)

## 2018-12-07 LAB — HEMOGLOBIN A1C: Hgb A1c MFr Bld: 6.3 % (ref 4.6–6.5)

## 2018-12-07 NOTE — Progress Notes (Signed)
Subjective:    Patient ID: Randy [name], male    DOB: [birth year], 78 y.o.   MRN: [id number]  HPI Patient presents for yearly preventative medicine examination. He is a pleasant 78 year old male who  has a past medical history of Diabetes mellitus, Hypertension, and OSA (obstructive sleep apnea) (11/24/2016).  DM - Takes Metformin 1000 mg daily.  He checks his blood sugars occasionally and reports readings of 100-120. Lab Results  Component Value Date   HGBA1C 6.0 11/07/2017   Hypertension -currently prescribed Zestoretic 10-12.5 mg.  Denies chest pain shortness of breath, headaches, or syncopal episodes.  He does report some intermittent episodes of dizziness or lightheadedness especially when he bends at the waist. BP Readings from Last 3 Encounters:  12/07/18 120/76  06/15/18 140/82  04/10/18 (!) 122/58   OSA - compliant with CPAP. Is followed by Pulmonary.   Hyperlipidemia - not currently on any medications.  Lab Results  Component Value Date   CHOL 183 11/07/2017   HDL 56.60 11/07/2017   LDLCALC 92 11/07/2017   TRIG 173.0 (H) 11/07/2017   CHOLHDL 3 11/07/2017    Acute Issues  -As mentioned above he is experiencing intermittent episodes of dizziness and lightheadedness.  In addition to this he also reports that over the last week he started to feel unsteady with his gait and has noticed that he is shuffling his feet more.  Does endorse a fall there this week when he missed judged to step outside of his home, he fell to the ground but denies any injuries  All immunizations and health maintenance protocols were reviewed with the patient and needed orders were placed. UTD  Appropriate screening laboratory values were ordered for the patient including screening of hyperlipidemia, renal function and hepatic function. If indicated by BPH, a PSA was ordered.  Medication reconciliation,  past medical history, social history, problem list and allergies were reviewed in detail with  the patient  Goals were established with regard to weight loss, exercise, and  diet in compliance with medications.  He is staying active with yard work as well walking approximately 2 miles a day.  He is trying to eat healthy, reports that he drinks 4 to 5 cups of coffee a day does and not drink much water  Wt Readings from Last 3 Encounters:  12/07/18 172 lb (78 kg)  06/15/18 177 lb 12.8 oz (80.6 kg)  04/10/18 171 lb 9.6 oz (77.8 kg)   End of life planning was discussed.  Review of Systems  Constitutional: Negative.   HENT: Negative.   Eyes: Negative.   Respiratory: Negative.   Cardiovascular: Negative.   Gastrointestinal: Negative.   Endocrine: Negative.   Genitourinary: Negative.   Musculoskeletal: Negative.   Skin: Negative.   Allergic/Immunologic: Negative.   Neurological: Positive for dizziness and light-headedness.  Hematological: Negative.   Psychiatric/Behavioral: Negative.   All other systems reviewed and are negative.  Past Medical History:  Diagnosis Date   Diabetes mellitus    Hypertension    OSA (obstructive sleep apnea) 11/24/2016    Social History   Socioeconomic History   Marital status: Married    Spouse name: Not on file   Number of children: Not on file   Years of education: Not on file   Highest education level: Not on file  Occupational History   Occupation: retired  Scientist, product/process development strain: Not on file   Food insecurity:    Worry: Not on file  Inability: Not on file   Transportation needs:    Medical: Not on file    Non-medical: Not on file  Tobacco Use   Smoking status: Former Smoker    Packs/day: 1.00    Years: 30.00    Pack years: 30.00    Types: Cigarettes    Last attempt to quit: 07/03/2001    Years since quitting: 17.4   Smokeless tobacco: Former Systems developer    Types: Malcolm date: 07/04/1991  Substance and Sexual Activity   Alcohol use: Yes    Alcohol/week: 3.0 standard drinks    Types: 3  Glasses of wine per week    Comment: daily alcohol wine with dinner   Drug use: No   Sexual activity: Yes  Lifestyle   Physical activity:    Days per week: Not on file    Minutes per session: Not on file   Stress: Not on file  Relationships   Social connections:    Talks on phone: Not on file    Gets together: Not on file    Attends religious service: Not on file    Active member of club or organization: Not on file    Attends meetings of clubs or organizations: Not on file    Relationship status: Not on file   Intimate partner violence:    Fear of current or ex partner: Not on file    Emotionally abused: Not on file    Physically abused: Not on file    Forced sexual activity: Not on file  Other Topics Concern   Not on file  Social History Narrative   Retired - Management consultant plants    Married       He likes to play golf. He likes to read and work in the yard.        Past Surgical History:  Procedure Laterality Date   CATARACT EXTRACTION, BILATERAL Bilateral 2019   COLONOSCOPY  07/06/2011   Procedure: COLONOSCOPY;  Surgeon: Winfield Cunas., MD;  Location: [facility] ENDOSCOPY;  Service: Endoscopy;  Laterality: N/A;   ESOPHAGOGASTRODUODENOSCOPY  07/05/2011   Procedure: ESOPHAGOGASTRODUODENOSCOPY (EGD);  Surgeon: Landry Dyke, MD;  Location: [facility] ENDOSCOPY;  Service: Endoscopy;  Laterality: N/A;    Family History  Problem Relation Age of Onset   Brain cancer Mother    Hypertension Father    Diabetes Father    Stroke Father     No Known Allergies  Current Outpatient Medications on File Prior to Visit  Medication Sig Dispense Refill   Blood Glucose Calibration (TRUE METRIX LEVEL 1) Low SOLN USE TO CHECK METER PRIOR TO TESTING SUGAR 3 each 0   Blood Glucose Monitoring Suppl (TRUE METRIX AIR GLUCOSE METER) w/Device KIT 1 each by Does not apply route daily. 1 kit 0   glucose blood test strip USE TO CHECK BLOOD SUGAR DAILY AND PRN 300 each 0   ketoconazole  (NIZORAL) 2 % cream Apply 1 fingertip amount to each foot twice daily. 30 g 0   lisinopril-hydrochlorothiazide (PRINZIDE,ZESTORETIC) 20-12.5 MG tablet Take 1 tablet by mouth daily. 90 tablet 2   metFORMIN (GLUCOPHAGE) 1000 MG tablet Take 1 tablet (1,000 mg total) by mouth daily with breakfast. 90 tablet 2   TRUEPLUS LANCETS 33G MISC USE TO CHECK BLOOD SUGAR DAILY AND PRN 300 each 0   No current facility-administered medications on file prior to visit.     BP 120/76 (BP Location: Left Arm, Patient Position: Sitting, Cuff Size: Large)  Pulse 69    Temp 98.2 F (36.8 C) (Oral)    Ht _0  (1.727 m)    Wt 172 lb (78 kg)    SpO2 93%    BMI 26.15 kg/m       Objective:   Physical Exam Vitals signs and nursing note reviewed.  Constitutional:      Appearance: Normal appearance.  HENT:     Right Ear: Tympanic membrane, ear canal and external ear normal. There is no impacted cerumen.     Left Ear: Tympanic membrane, ear canal and external ear normal. There is no impacted cerumen.     Nose: Nose normal. No congestion or rhinorrhea.     Mouth/Throat:     Mouth: Mucous membranes are moist.     Pharynx: Oropharynx is clear.  Eyes:     Extraocular Movements: Extraocular movements intact.     Pupils: Pupils are equal, round, and reactive to light.  Neck:     Musculoskeletal: Normal range of motion and neck supple.  Cardiovascular:     Rate and Rhythm: Normal rate and regular rhythm.     Pulses: Normal pulses.     Heart sounds: Normal heart sounds.  Pulmonary:     Effort: Pulmonary effort is normal.     Breath sounds: Normal breath sounds.  Abdominal:     General: Abdomen is flat.     Palpations: Abdomen is soft.  Musculoskeletal: Normal range of motion.        General: No swelling, tenderness, deformity or signs of injury.     Right lower leg: No edema.     Left lower leg: No edema.  Skin:    General: Skin is warm and dry.     Capillary Refill: Capillary refill takes less than 2  seconds.     Coloration: Skin is not jaundiced or pale.     Findings: No bruising, erythema, lesion or rash.  Neurological:     General: No focal deficit present.     Mental Status: He is alert and oriented to person, place, and time.     Sensory: Sensation is intact.     Motor: Motor function is intact.     Coordination: Coordination is intact.     Gait: Gait abnormal (shuffling gait ).  Psychiatric:        Mood and Affect: Mood normal.        Behavior: Behavior normal.        Thought Content: Thought content normal.        Judgment: Judgment normal.        Assessment & Plan:  1. Routine general medical examination at a health care facility - CBC with Differential/Platelet - Comprehensive metabolic panel - Hemoglobin A1c - Lipid panel - TSH - POC Urinalysis Dipstick  2. Diabetes mellitus without complication ([place]) -Consider increase in metformin - CBC with Differential/Platelet - Comprehensive metabolic panel - Hemoglobin A1c - Lipid panel - TSH - POC Urinalysis Dipstick  3. Essential hypertension -Well-controlled.  No change in medication at this time, may need to decrease dose of hydrochlorothiazide due to dizziness - CBC with Differential/Platelet - Comprehensive metabolic panel - Hemoglobin A1c - Lipid panel - TSH - POC Urinalysis Dipstick  4. OSA (obstructive sleep apnea) -Continue with pulmonary plan of care  5. Gait instability -Refer to physical therapy - CBC with Differential/Platelet - Comprehensive metabolic panel - Hemoglobin A1c - TSH - POC Urinalysis Dipstick  6. Dizziness -Simply dehydration versus orthostatic hypotension.  Will check labs.  Advised to cut back on coffee and drink more water throughout the day. - CBC with Differential/Platelet - Comprehensive metabolic panel - Hemoglobin A1c - Lipid panel - TSH - POC Urinalysis Dipstick - Ambulatory referral to Physical Therapy  Dorothyann Peng, NP

## 2018-12-07 NOTE — Patient Instructions (Signed)
Health Maintenance Due  Topic Date Due  . HEMOGLOBIN A1C  05/10/2018  . OPHTHALMOLOGY EXAM  08/14/2018  . FOOT EXAM  11/08/2018    Depression screen [facility] 2/9 11/07/2017 05/08/2015 12/19/2013  Decreased Interest 0 0 0  Down, Depressed, Hopeless 0 0 0  PHQ - 2 Score 0 0 0

## 2018-12-10 ENCOUNTER — Ambulatory Visit: Payer: Medicare PPO | Attending: Adult Health

## 2018-12-10 ENCOUNTER — Other Ambulatory Visit: Payer: Self-pay

## 2018-12-10 DIAGNOSIS — M6281 Muscle weakness (generalized): Secondary | ICD-10-CM | POA: Diagnosis present

## 2018-12-10 DIAGNOSIS — R262 Difficulty in walking, not elsewhere classified: Secondary | ICD-10-CM | POA: Insufficient documentation

## 2018-12-10 NOTE — Patient Instructions (Signed)
Access Code: [email or web address]  URL: [email or web address]  Date: 12/10/2018  Prepared by: Sigurd Sos   Exercises  Sit to Stand without Arm Support - 10 reps - 2 sets - 2x daily - 7x weekly  Standing Hip Abduction - 10 reps - 2 sets - 2x daily - 7x weekly  Seated Correct Posture - 10 reps - 3 sets - 1x daily - 7x weekly  Seated Hamstring Stretch - 3 reps - 20 hold - 3x daily - 7x weekly  Forward Step Up - 10 reps - 2 sets - 10 hold - 2x daily - 7x weekly

## 2018-12-10 NOTE — Therapy (Signed)
[facility] [address], [place], [place], [address] Phone: [phone]   Fax:  [phone]  Physical Therapy Evaluation  Patient Details  Name: Randy [name] MRN: [id number] Date of Birth: 13-Oct-1940 Referring Provider (PT): Dorothyann Peng, MD   Encounter Date: 12/10/2018  PT End of Session - 12/10/18 0905    Visit Number  1    Date for PT Re-Evaluation  02/04/19    Authorization Type  [employer]    PT Start Time  0832    PT Stop Time  0909    PT Time Calculation (min)  37 min    Activity Tolerance  Patient tolerated treatment well    Behavior During Therapy  [facility] for tasks assessed/performed       Past Medical History:  Diagnosis Date  . Diabetes mellitus   . Hypertension   . OSA (obstructive sleep apnea) 11/24/2016    Past Surgical History:  Procedure Laterality Date  . CATARACT EXTRACTION, BILATERAL Bilateral 2019  . COLONOSCOPY  07/06/2011   Procedure: COLONOSCOPY;  Surgeon: Winfield Cunas., MD;  Location: [facility] ENDOSCOPY;  Service: Endoscopy;  Laterality: N/A;  . ESOPHAGOGASTRODUODENOSCOPY  07/05/2011   Procedure: ESOPHAGOGASTRODUODENOSCOPY (EGD);  Surgeon: Landry Dyke, MD;  Location[facility] ENDOSCOPY;  Service: Endoscopy;  Laterality: N/A;    There were no vitals filed for this visit.   Subjective Assessment - 12/10/18 0836    Subjective  Pt presents to PT because he is feeling unsteady on his feet.  Pt has not had falls, just feels wobbly and his legs get tied.     Pertinent History  falls in the past.  Last name rhymes with "owl"    How long can you walk comfortably?  walk 30-40 minutes a day    Diagnostic tests  none    Patient Stated Goals  improve balance, reduce falls risk    Currently in Pain?  No/denies         [facility] PT Assessment - 12/10/18 0001      Assessment   Medical Diagnosis  gait instability    Referring Provider (PT)  Dorothyann Peng, MD    Onset Date/Surgical Date  12/10/15    Next MD Visit  none    Prior Therapy  none      Precautions   Precautions  Fall      Restrictions   Weight Bearing Restrictions  No      Balance Screen   Has the patient fallen in the past 6 months  No    Has the patient had a decrease in activity level because of a fear of falling?   No    Is the patient reluctant to leave their home because of a fear of falling?   No      Home Environment   Living Environment  Private residence    Living Arrangements  Spouse/significant other    Type of [place] to enter    Home Layout  Two level      Prior Function   Level of [place]  Retired    Leisure  walk for exercise      Cognition   Overall Cognitive Status  Within Functional Limits for tasks assessed      Posture/Postural Control   Posture/Postural Control  Postural limitations    Postural Limitations  Rounded Shoulders;Forward head  ROM / Strength   AROM / PROM / Strength  AROM;Strength      AROM   Overall AROM   Deficits    Overall AROM Comments  hip flexibility limited by 25-50% throghout without pain      Strength   Overall Strength  Deficits    Overall Strength Comments  4/5 bil hip strength, 4+/5 bil knee strength, 5/5 ankle strength      Palpation   Palpation comment  NA      Transfers   Transfers  Stand to Sit;Sit to Stand    Sit to Stand  With upper extremity assist    Five time sit to stand comments   10.14 seconds without hand    Stand to Sit  With upper extremity assist      Ambulation/Gait   Ambulation/Gait  Yes    Gait Pattern  Decreased stride length;Trunk flexed;Decreased trunk rotation    Stairs  Yes    Stairs Assistance  6: Modified independent (Device/Increase time)    Stair Management Technique  One rail Right;Alternating pattern   decreased eccentric control with descending   Number of Stairs  4    Height of Stairs  6    Gait Comments  feet together with eyes open and closed  x10  seconds with stand by assistnace. Single leg stance on Rt 5 seconds, Lt 2 seconds      Balance   Balance Assessed  Yes                Objective measurements completed on examination: See above findings.              PT Education - 12/10/18 0901    Education Details   Access Code: [email or web address]     Person(s) Educated  Patient    Methods  Explanation;Demonstration;Handout    Comprehension  Verbalized understanding;Returned demonstration       PT Short Term Goals - 12/10/18 0840      PT SHORT TERM GOAL #1   Title  be independent in initial HEP    Time  4    Period  Weeks    Status  New    Target Date  01/07/19      PT SHORT TERM GOAL #2   Title  improve LE strength to perform stand to sit with controlled descent    Time  4    Period  Weeks    Status  New    Target Date  01/07/19      PT SHORT TERM GOAL #3   Title  verbalize and demonstrate understanding of falls risk and falls prevention    Time  4    Period  Weeks    Status  New    Target Date  01/07/19        PT Long Term Goals - 12/10/18 1024      PT LONG TERM GOAL #1   Title  be independent in advanced HEP    Time  8    Status  New    Target Date  02/04/19      PT LONG TERM GOAL #2   Title  perform single leg stance on Rt and Lt x 8 seconds each to improve safety and balance    Time  8    Period  Weeks    Status  New    Target Date  02/04/19      PT LONG TERM GOAL #3   Title  descend steps with good eccentric control to improve safety on steps at home    Time  8    Period  Weeks    Status  New    Target Date  02/04/19      PT LONG TERM GOAL #4   Title  demonstrate stability with change of direction on level surface    Time  8    Period  Weeks    Status  New    Target Date  02/04/19             Plan - 12/10/18 0930    Clinical Impression Statement  Pt presents to PT with complaints of worsening balance over the past year.  Pt has had loss of balance but denies a fall in  the past 6 months.  Pt reports he is most unsteady with descending steps and in the morning hours.  Pt demonstrates scissoring gait pattern with change of direction, poor standing posture with flexed trunk, rounded shoulders and forward head.  Bil hip flexibility is limited by 25-50% overall.  5x sit to stand is 10 seconds with poor control.  Pt descends steps with poor eccentric control.  SLS on the Lt is 2 seconds, Rt x 5 seconds.  PT will benefit from skilled PT to improve balance, gait and endurance to improve safety at home and in the community.      Personal Factors and Comorbidities  Age    Examination-Activity Limitations  Locomotion Level;Transfers    Stability/Clinical Decision Making  Stable/Uncomplicated    Clinical Decision Making  Low    Rehab Potential  Excellent    PT Frequency  2x / week    PT Duration  8 weeks    PT Treatment/Interventions  ADLs/Self Care Home Management;Cryotherapy;Electrical Stimulation;Moist Heat;Gait training;Stair training;Functional mobility training;Therapeutic activities;Therapeutic exercise;Neuromuscular re-education;Balance training;Patient/family education;Manual techniques;Taping    PT Next Visit Plan  balance, endurance, strength    PT Home Exercise Plan  Access Code: [email or web address]     Consulted and Agree with Plan of Care  Patient       Patient will benefit from skilled therapeutic intervention in order to improve the following deficits and impairments:  Abnormal gait, Decreased balance, Decreased endurance, Decreased mobility, Decreased range of motion, Decreased strength, Impaired flexibility, Postural dysfunction, Improper body mechanics, Pain  Visit Diagnosis: Difficulty in walking, not elsewhere classified - Plan: PT plan of care cert/re-cert  Muscle weakness (generalized) - Plan: PT plan of care cert/re-cert     Problem List Patient Active Problem List   Diagnosis Date Noted  . Sleep related hypoxia 01/09/2017  . Periodic limb movements  of sleep 01/09/2017  . OSA (obstructive sleep apnea) 11/24/2016  . Diabetes mellitus without complication ([place]) 70/96/2836  . Upper GI bleed 07/06/2011  . GI bleeding 07/05/2011  . Anemia associated with acute blood loss 07/05/2011  . DM (diabetes mellitus), type 2 ([place]) 07/05/2011  . HTN (hypertension) 07/05/2011  . Hyperlipidemia 07/05/2011   Sigurd Sos, PT 12/10/18 10:32 AM  [facility] [address] [place], [place], [address] Phone: [phone]   Fax:  [phone]  Name: ENDRE COUTTS MRN: [id number] Date of Birth: 1941/05/09

## 2018-12-11 ENCOUNTER — Other Ambulatory Visit: Payer: Self-pay | Admitting: Adult Health

## 2018-12-11 DIAGNOSIS — E875 Hyperkalemia: Secondary | ICD-10-CM

## 2018-12-12 ENCOUNTER — Encounter: Payer: Self-pay | Admitting: Physical Therapy

## 2018-12-12 ENCOUNTER — Ambulatory Visit: Payer: Medicare PPO | Admitting: Physical Therapy

## 2018-12-12 ENCOUNTER — Other Ambulatory Visit: Payer: Self-pay

## 2018-12-12 DIAGNOSIS — R262 Difficulty in walking, not elsewhere classified: Secondary | ICD-10-CM

## 2018-12-12 DIAGNOSIS — M6281 Muscle weakness (generalized): Secondary | ICD-10-CM

## 2018-12-12 NOTE — Therapy (Signed)
[facility] [address] [place], [place], [address] Phone: [phone]   Fax:  [phone]  Physical Therapy Treatment  Patient Details  Name: Randy [name] MRN: [id number] Date of Birth: 1941/04/06 Referring Provider (PT): Dorothyann Peng, MD   Encounter Date: 12/12/2018  PT End of Session - 12/12/18 0759    Visit Number  2    Date for PT Re-Evaluation  02/04/19    Authorization Type  [employer]    PT Start Time  0756    PT Stop Time  0845    PT Time Calculation (min)  49 min    Activity Tolerance  Patient tolerated treatment well    Behavior During Therapy  [facility] for tasks assessed/performed       Past Medical History:  Diagnosis Date  . Diabetes mellitus   . Hypertension   . OSA (obstructive sleep apnea) 11/24/2016    Past Surgical History:  Procedure Laterality Date  . CATARACT EXTRACTION, BILATERAL Bilateral 2019  . COLONOSCOPY  07/06/2011   Procedure: COLONOSCOPY;  Surgeon: Winfield Cunas., MD;  Location: [facility] ENDOSCOPY;  Service: Endoscopy;  Laterality: N/A;  . ESOPHAGOGASTRODUODENOSCOPY  07/05/2011   Procedure: ESOPHAGOGASTRODUODENOSCOPY (EGD);  Surgeon: Landry Dyke, MD;  Location: [facility] ENDOSCOPY;  Service: Endoscopy;  Laterality: N/A;    There were no vitals filed for this visit.  Subjective Assessment - 12/12/18 0800    Subjective  I am doing my HEP but I have a few questions.     Pertinent History  falls in the past.  Last name rhymes with "owl"    Patient Stated Goals  improve balance, reduce falls risk    Currently in Pain?  No/denies    Multiple Pain Sites  No                       [facility] Adult PT Treatment/Exercise - 12/12/18 0001      Self-Care   Self-Care  Posture   VC for more core engagment, Pt could demo this   Posture  Review of posture: standing more erect, Pt could demo correct   VC for thoracic extension     Lumbar Exercises: Seated   Other Seated Lumbar  Exercises  Towel at thoracic: extensions with scap squeeze 10x, pt required review as he did not understand the exercise      Knee/Hip Exercises: Stretches   Active Hamstring Stretch  Both;3 reps;10 seconds    Active Hamstring Stretch Limitations  VC & TC for correct technique      Knee/Hip Exercises: Aerobic   Nustep  L1 x 5 min with PTA present      Knee/Hip Exercises: Standing   Hip Abduction  Stengthening;Both;2 sets;10 reps;Knee straight   3rd set added 2# wts 10x bil   Forward Step Up  Both;2 sets;10 reps;Hand Hold: 2;Step Height: 6"   Light UE only, VC to straighten RTLE/tends to turn out              PT Short Term Goals - 12/10/18 0840      PT SHORT TERM GOAL #1   Title  be independent in initial HEP    Time  4    Period  Weeks    Status  New    Target Date  01/07/19      PT SHORT TERM GOAL #2   Title  improve LE strength to perform stand to sit with controlled descent  Time  4    Period  Weeks    Status  New    Target Date  01/07/19      PT SHORT TERM GOAL #3   Title  verbalize and demonstrate understanding of falls risk and falls prevention    Time  4    Period  Weeks    Status  New    Target Date  01/07/19        PT Long Term Goals - 12/10/18 1024      PT LONG TERM GOAL #1   Title  be independent in advanced HEP    Time  8    Status  New    Target Date  02/04/19      PT LONG TERM GOAL #2   Title  perform single leg stance on Rt and Lt x 8 seconds each to improve safety and balance    Time  8    Period  Weeks    Status  New    Target Date  02/04/19      PT LONG TERM GOAL #3   Title  descend steps with good eccentric control to improve safety on steps at home    Time  8    Period  Weeks    Status  New    Target Date  02/04/19      PT LONG TERM GOAL #4   Title  demonstrate stability with change of direction on level surface    Time  8    Period  Weeks    Status  New    Target Date  02/04/19            Plan - 12/12/18  0759    Clinical Impression Statement  Pt arrives with no new complaints and compliance with initial HEP. He did require complete review of initial HEP due to his questions and tweaking the technique of his stretches. Session focused heavily on posture and recruitment of the core and how it relates to his balance. Pt was able to show a more stable gait pattern at end of session as he was thinking more of his posture and core engagment.     Personal Factors and Comorbidities  Age    Examination-Activity Limitations  Locomotion Level;Transfers    Stability/Clinical Decision Making  Stable/Uncomplicated    Rehab Potential  Excellent    PT Frequency  2x / week    PT Duration  8 weeks    PT Treatment/Interventions  ADLs/Self Care Home Management;Cryotherapy;Electrical Stimulation;Moist Heat;Gait training;Stair training;Functional mobility training;Therapeutic activities;Therapeutic exercise;Neuromuscular re-education;Balance training;Patient/family education;Manual techniques;Taping    PT Next Visit Plan  Practice single leg stance, consider HEP for further core strength, postural endurance exercises.    PT Home Exercise Plan  Access Code: [email or web address]     Consulted and Agree with Plan of Care  Patient       Patient will benefit from skilled therapeutic intervention in order to improve the following deficits and impairments:  Abnormal gait, Decreased balance, Decreased endurance, Decreased mobility, Decreased range of motion, Decreased strength, Impaired flexibility, Postural dysfunction, Improper body mechanics, Pain  Visit Diagnosis: Difficulty in walking, not elsewhere classified  Muscle weakness (generalized)     Problem List Patient Active Problem List   Diagnosis Date Noted  . Sleep related hypoxia 01/09/2017  . Periodic limb movements of sleep 01/09/2017  . OSA (obstructive sleep apnea) 11/24/2016  . Diabetes mellitus without complication ([place]) 67/61/9509  . Upper GI bleed 07/06/2011   .  GI bleeding 07/05/2011  . Anemia associated with acute blood loss 07/05/2011  . DM (diabetes mellitus), type 2 ([place]) 07/05/2011  . HTN (hypertension) 07/05/2011  . Hyperlipidemia 07/05/2011    Atara Paterson, PTA 12/12/2018, 8:47 AM  [facility] [address] [place], [place], [address] Phone: [phone]   Fax:  [phone]  Name: Randy [name] MRN: [id number] Date of Birth: 12/31/1940

## 2018-12-20 ENCOUNTER — Other Ambulatory Visit: Payer: Self-pay

## 2018-12-20 ENCOUNTER — Encounter: Payer: Self-pay | Admitting: Physical Therapy

## 2018-12-20 ENCOUNTER — Ambulatory Visit: Payer: Medicare PPO | Admitting: Physical Therapy

## 2018-12-20 DIAGNOSIS — R262 Difficulty in walking, not elsewhere classified: Secondary | ICD-10-CM | POA: Diagnosis not present

## 2018-12-20 DIAGNOSIS — M6281 Muscle weakness (generalized): Secondary | ICD-10-CM

## 2018-12-20 NOTE — Therapy (Signed)
[facility] [address] [place] [place], [place], [address] Phone: [phone]   Fax:  [phone]  Physical Therapy Treatment  Patient Details  Name: Randy [name] MRN: [id number] Date of Birth: 1940/07/13 Referring Provider (PT): Dorothyann Peng, MD   Encounter Date: 12/20/2018  PT End of Session - 12/20/18 0801    Visit Number  3    Date for PT Re-Evaluation  02/04/19    Authorization Type  [employer]    PT Start Time  0759    PT Stop Time  0841    PT Time Calculation (min)  42 min    Activity Tolerance  Patient tolerated treatment well    Behavior During Therapy  [facility] for tasks assessed/performed       Past Medical History:  Diagnosis Date  . Diabetes mellitus   . Hypertension   . OSA (obstructive sleep apnea) 11/24/2016    Past Surgical History:  Procedure Laterality Date  . CATARACT EXTRACTION, BILATERAL Bilateral 2019  . COLONOSCOPY  07/06/2011   Procedure: COLONOSCOPY;  Surgeon: Winfield Cunas., MD;  Location: [facility] ENDOSCOPY;  Service: Endoscopy;  Laterality: N/A;  . ESOPHAGOGASTRODUODENOSCOPY  07/05/2011   Procedure: ESOPHAGOGASTRODUODENOSCOPY (EGD);  Surgeon: Landry Dyke, MD;  Location: [facility] ENDOSCOPY;  Service: Endoscopy;  Laterality: N/A;    There were no vitals filed for this visit.  Subjective Assessment - 12/20/18 0802    Subjective  Doing my exercises daily, I thought i would be stronger by now.    Pertinent History  falls in the past.  Last name rhymes with "owl"    Currently in Pain?  No/denies    Multiple Pain Sites  No                       [facility] Adult PT Treatment/Exercise - 12/20/18 0001      Knee/Hip Exercises: Stretches   Gastroc Stretch  --   Slant board 3x 20 sec     Knee/Hip Exercises: Aerobic   Nustep  L2 x 6 min    PTA present to discuss status     Knee/Hip Exercises: Machines for Strengthening   Cybex Leg Press  Seat 6: Bil LE 50# 2x10, VC for eccentric  control: Single leg 30# 20x each side      Knee/Hip Exercises: Standing   Hip Flexion  Stengthening;Both;2 sets;10 reps;Knee bent    Hip Flexion Limitations  2# Vc for posture    Hip Abduction  Stengthening;Both;2 sets;10 reps;Knee straight    Abduction Limitations  2#     SLS  Level surface 3x 10-20 sec intermittently using fingers for support      Knee/Hip Exercises: Seated   Long Arc Quad  Strengthening;Both;2 sets;10 reps;Weights    Long Arc Quad Weight  2 lbs.    Sit to Sand  2 sets;10 reps;without UE support      Shoulder Exercises: Seated   Horizontal ABduction  Strengthening;Both;20 reps;Theraband    Theraband Level (Shoulder Horizontal ABduction)  Level 2 (Red)    Horizontal ABduction Limitations  pool noodle for thoracic extension               PT Short Term Goals - 12/20/18 0830      PT SHORT TERM GOAL #1   Title  be independent in initial HEP    Period  Weeks    Status  Achieved      PT SHORT TERM GOAL #2  Title  improve LE strength to perform stand to sit with controlled descent    Time  4    Period  Weeks    Status  Achieved      PT SHORT TERM GOAL #3   Title  verbalize and demonstrate understanding of falls risk and falls prevention    Time  4    Period  Weeks    Status  Achieved        PT Long Term Goals - 12/10/18 1024      PT LONG TERM GOAL #1   Title  be independent in advanced HEP    Time  8    Status  New    Target Date  02/04/19      PT LONG TERM GOAL #2   Title  perform single leg stance on Rt and Lt x 8 seconds each to improve safety and balance    Time  8    Period  Weeks    Status  New    Target Date  02/04/19      PT LONG TERM GOAL #3   Title  descend steps with good eccentric control to improve safety on steps at home    Time  8    Period  Weeks    Status  New    Target Date  02/04/19      PT LONG TERM GOAL #4   Title  demonstrate stability with change of direction on level surface    Time  8    Period  Weeks     Status  New    Target Date  02/04/19            Plan - 12/20/18 0803    Clinical Impression Statement  Pt arrives today reporting continued compliance with his HEP and questioning why he is not stronger yet. PTA discussed this with pt educating him on how one becomes stronger and what the initial physiologic effects of strength are. He understood the principles. All STGs were met today. Pt continues to work on his posture and feel shis awareness is getting some better. HEP was progressed to added posterior shoulder and upper back strength to help with his postural corrections and single leg stance activity.    Personal Factors and Comorbidities  Age    Examination-Activity Limitations  Locomotion Level;Transfers    Stability/Clinical Decision Making  Stable/Uncomplicated    Rehab Potential  Excellent    PT Frequency  2x / week    PT Duration  8 weeks    PT Treatment/Interventions  ADLs/Self Care Home Management;Cryotherapy;Electrical Stimulation;Moist Heat;Gait training;Stair training;Functional mobility training;Therapeutic activities;Therapeutic exercise;Neuromuscular re-education;Balance training;Patient/family education;Manual techniques;Taping    PT Next Visit Plan  Practice single leg stance, consider HEP for further core strength, postural endurance exercises.    PT Home Exercise Plan  Access Code: [email or web address]     Consulted and Agree with Plan of Care  Patient       Patient will benefit from skilled therapeutic intervention in order to improve the following deficits and impairments:  Abnormal gait, Decreased balance, Decreased endurance, Decreased mobility, Decreased range of motion, Decreased strength, Impaired flexibility, Postural dysfunction, Improper body mechanics, Pain  Visit Diagnosis: 1. Difficulty in walking, not elsewhere classified   2. Muscle weakness (generalized)        Problem List Patient Active Problem List   Diagnosis Date Noted  . Sleep related hypoxia  01/09/2017  . Periodic limb movements of sleep 01/09/2017  .  OSA (obstructive sleep apnea) 11/24/2016  . Diabetes mellitus without complication ([place]) 03/00/9233  . Upper GI bleed 07/06/2011  . GI bleeding 07/05/2011  . Anemia associated with acute blood loss 07/05/2011  . DM (diabetes mellitus), type 2 ([place]) 07/05/2011  . HTN (hypertension) 07/05/2011  . Hyperlipidemia 07/05/2011    Kayleena Eke, PTA 12/20/2018, 8:54 AM  [facility] [address] [place], [place], [address] Phone: [phone]   Fax:  [phone]  Name: Randy [name] MRN: [id number] Date of Birth: [birth year]  Access Code: [email or web address]  URL: [email or web address]  Date: 12/20/2018  Prepared by: Myrene Galas   Exercises  Sit to Stand without Arm Support - 10 reps - 2 sets - 2x daily - 7x weekly  Standing Hip Abduction - 10 reps - 2 sets - 2x daily - 7x weekly  Seated Correct Posture - 10 reps - 3 sets - 1x daily - 7x weekly  Seated Hamstring Stretch - 3 reps - 20 hold - 3x daily - 7x weekly  Forward Step Up - 10 reps - 2 sets - 10 hold - 2x daily - 7x weekly  Single Leg Stance - 10 reps - 10 hold - 2x daily - 7x weekly  Seated Shoulder Horizontal Abduction with Resistance - 10 reps - 2 sets - 2x daily - 7x weekly

## 2018-12-25 ENCOUNTER — Ambulatory Visit: Payer: Medicare PPO

## 2018-12-25 ENCOUNTER — Other Ambulatory Visit: Payer: Self-pay

## 2018-12-25 ENCOUNTER — Other Ambulatory Visit (INDEPENDENT_AMBULATORY_CARE_PROVIDER_SITE_OTHER): Payer: Medicare PPO

## 2018-12-25 DIAGNOSIS — R262 Difficulty in walking, not elsewhere classified: Secondary | ICD-10-CM | POA: Diagnosis not present

## 2018-12-25 DIAGNOSIS — E875 Hyperkalemia: Secondary | ICD-10-CM | POA: Diagnosis not present

## 2018-12-25 DIAGNOSIS — M6281 Muscle weakness (generalized): Secondary | ICD-10-CM

## 2018-12-25 LAB — BASIC METABOLIC PANEL
BUN: 18 mg/dL (ref 6–23)
CO2: 35 mEq/L — ABNORMAL HIGH (ref 19–32)
Calcium: 10.4 mg/dL (ref 8.4–10.5)
Chloride: 96 mEq/L (ref 96–112)
Creatinine, Ser: 1.04 mg/dL (ref 0.40–1.50)
GFR: 69.02 mL/min (ref >60.00)
Glucose, Bld: 159 mg/dL — ABNORMAL HIGH (ref 70–99)
Potassium: 4.5 mEq/L (ref 3.5–5.1)
Sodium: 135 mEq/L (ref 135–145)

## 2018-12-25 NOTE — Therapy (Signed)
[facility] [address] [place], [place], [address] Phone: [phone]   Fax:  [phone]  Physical Therapy Treatment  Patient Details  Name: Randy [name] MRN: [id number] Date of Birth: [birth year] Referring Provider (PT): Dorothyann Peng, MD   Encounter Date: 12/25/2018  PT End of Session - 12/25/18 1105    Visit Number  4    Date for PT Re-Evaluation  02/04/19    Authorization Type  [employer]    PT Start Time  1028    PT Stop Time  1110    PT Time Calculation (min)  42 min    Activity Tolerance  Patient tolerated treatment well    Behavior During Therapy  [facility] for tasks assessed/performed       Past Medical History:  Diagnosis Date  . Diabetes mellitus   . Hypertension   . OSA (obstructive sleep apnea) 11/24/2016    Past Surgical History:  Procedure Laterality Date  . CATARACT EXTRACTION, BILATERAL Bilateral 2019  . COLONOSCOPY  07/06/2011   Procedure: COLONOSCOPY;  Surgeon: Winfield Cunas., MD;  Location: [facility] ENDOSCOPY;  Service: Endoscopy;  Laterality: N/A;  . ESOPHAGOGASTRODUODENOSCOPY  07/05/2011   Procedure: ESOPHAGOGASTRODUODENOSCOPY (EGD);  Surgeon: Landry Dyke, MD;  Location: [facility] ENDOSCOPY;  Service: Endoscopy;  Laterality: N/A;    There were no vitals filed for this visit.  Subjective Assessment - 12/25/18 1026    Subjective  I think I am making progress. I am still having trouble standing on one leg.    Pertinent History  falls in the past.  Last name rhymes with "owl"    Currently in Pain?  No/denies                       [facility] Adult PT Treatment/Exercise - 12/25/18 0001      Lumbar Exercises: Aerobic   UBE (Upper Arm Bike)  standing: Level 1x 6 minutes (3/3)   verbal cues for posture, Rt UE fatigue with reverse     Lumbar Exercises: Standing   Row  Strengthening;Both;20 reps;Theraband    Theraband Level (Row)  Level 2 (Red)    Shoulder Extension   Strengthening;Both;20 reps;Theraband    Theraband Level (Shoulder Extension)  Level 2 (Red)      Knee/Hip Exercises: Stretches   Press photographer  --   Slant board 3x 20 sec     Knee/Hip Exercises: Aerobic   Nustep  L x 8 min    PT present to discuss status     Knee/Hip Exercises: Machines for Strengthening   Cybex Leg Press  Seat 6: Bil LE 50# 2x10, VC for eccentric control: Single leg 30# 20x each side      Knee/Hip Exercises: Standing   Hip Flexion  --    Hip Flexion Limitations  --    Hip Abduction  Stengthening;Both;2 sets;10 reps;Knee straight    Abduction Limitations  2#, standing on black balance pad    Hip Extension  Stengthening;Both;2 sets;20 reps;Knee straight    Extension Limitations  2# standing on black pad    SLS  Level surface 3x 10-20 sec intermittently using fingers for support      Knee/Hip Exercises: Seated   Sit to Sand  2 sets;10 reps;without UE support      Shoulder Exercises: Seated   Horizontal ABduction  Strengthening;Both;20 reps;Theraband    Theraband Level (Shoulder Horizontal ABduction)  Level 2 (Red)    Horizontal ABduction Limitations  pool  noodle for thoracic extension               PT Short Term Goals - 12/25/18 1056      PT SHORT TERM GOAL #1   Title  be independent in initial HEP    Status  Achieved      PT SHORT TERM GOAL #2   Title  improve LE strength to perform stand to sit with controlled descent    Status  Achieved      PT SHORT TERM GOAL #3   Title  verbalize and demonstrate understanding of falls risk and falls prevention    Status  Achieved        PT Long Term Goals - 12/10/18 1024      PT LONG TERM GOAL #1   Title  be independent in advanced HEP    Time  8    Status  New    Target Date  02/04/19      PT LONG TERM GOAL #2   Title  perform single leg stance on Rt and Lt x 8 seconds each to improve safety and balance    Time  8    Period  Weeks    Status  New    Target Date  02/04/19      PT LONG TERM  GOAL #3   Title  descend steps with good eccentric control to improve safety on steps at home    Time  8    Period  Weeks    Status  New    Target Date  02/04/19      PT LONG TERM GOAL #4   Title  demonstrate stability with change of direction on level surface    Time  8    Period  Weeks    Status  New    Target Date  02/04/19            Plan - 12/25/18 1046    Clinical Impression Statement  Pt continues to work on balance and posture.  Pt tolerated advancement of exercise with addition of balance pad with standing hip strength, standing arm bike and increased time and resistance with NuStep.  Pt demonstrated fatigue with standing arm bike.  Pt has improved postural awareness and requires fewer verbal cues for alignment and makes many postural corrections without cueing.  Pt requires stand by assistance for safety with exercise. Pt with chronic strength, balance and postural deficits and will continue to benefit from skilled PT to improve safety, strength and endurance for home and community tasks.    PT Frequency  2x / week    PT Duration  8 weeks    PT Treatment/Interventions  ADLs/Self Care Home Management;Cryotherapy;Electrical Stimulation;Moist Heat;Gait training;Stair training;Functional mobility training;Therapeutic activities;Therapeutic exercise;Neuromuscular re-education;Balance training;Patient/family education;Manual techniques;Taping    PT Next Visit Plan  Practice single leg stance, consider HEP for further core strength, postural endurance exercises.    PT Home Exercise Plan  Access Code: [email or web address]     Recommended Other Services  initial certification is signed    Consulted and Agree with Plan of Care  Patient       Patient will benefit from skilled therapeutic intervention in order to improve the following deficits and impairments:  Abnormal gait, Decreased balance, Decreased endurance, Decreased mobility, Decreased range of motion, Decreased strength, Impaired  flexibility, Postural dysfunction, Improper body mechanics, Pain  Visit Diagnosis: 1. Difficulty in walking, not elsewhere classified   2. Muscle weakness (generalized)  Problem List Patient Active Problem List   Diagnosis Date Noted  . Sleep related hypoxia 01/09/2017  . Periodic limb movements of sleep 01/09/2017  . OSA (obstructive sleep apnea) 11/24/2016  . Diabetes mellitus without complication ([place]) 40/99/2780  . Upper GI bleed 07/06/2011  . GI bleeding 07/05/2011  . Anemia associated with acute blood loss 07/05/2011  . DM (diabetes mellitus), type 2 ([place]) 07/05/2011  . HTN (hypertension) 07/05/2011  . Hyperlipidemia 07/05/2011    Sigurd Sos, PT 12/25/18 11:07 AM   [facility] [address] [place], [place], [address] Phone: [phone]   Fax:  [phone]  Name: MELVYN HOMMES MRN: [id number] Date of Birth: [birth year]

## 2018-12-26 LAB — PTH, INTACT AND CALCIUM
Calcium: 11 mg/dL — ABNORMAL HIGH (ref 8.6–10.3)
PTH: 63 pg/mL (ref 14–64)

## 2018-12-27 ENCOUNTER — Ambulatory Visit: Payer: Medicare PPO

## 2018-12-27 ENCOUNTER — Other Ambulatory Visit: Payer: Self-pay

## 2018-12-27 DIAGNOSIS — R262 Difficulty in walking, not elsewhere classified: Secondary | ICD-10-CM | POA: Diagnosis not present

## 2018-12-27 DIAGNOSIS — M6281 Muscle weakness (generalized): Secondary | ICD-10-CM

## 2018-12-27 NOTE — Therapy (Signed)
[facility] [address], [place], [place], [address] Phone: [phone]   Fax:  [phone]  Physical Therapy Treatment  Patient Details  Name: Randy [name] MRN: [id number] Date of Birth: [birth year] Referring Provider (PT): Dorothyann Peng, MD   Encounter Date: 12/27/2018  PT End of Session - 12/27/18 1104    Visit Number  5    Date for PT Re-Evaluation  02/04/19    Authorization Type  [employer]    PT Start Time  1028    PT Stop Time  1109    PT Time Calculation (min)  41 min    Activity Tolerance  Patient tolerated treatment well    Behavior During Therapy  [facility] for tasks assessed/performed       Past Medical History:  Diagnosis Date  . Diabetes mellitus   . Hypertension   . OSA (obstructive sleep apnea) 11/24/2016    Past Surgical History:  Procedure Laterality Date  . CATARACT EXTRACTION, BILATERAL Bilateral 2019  . COLONOSCOPY  07/06/2011   Procedure: COLONOSCOPY;  Surgeon: Winfield Cunas., MD;  Location: [facility] ENDOSCOPY;  Service: Endoscopy;  Laterality: N/A;  . ESOPHAGOGASTRODUODENOSCOPY  07/05/2011   Procedure: ESOPHAGOGASTRODUODENOSCOPY (EGD);  Surgeon: Landry Dyke, MD;  Location: [facility] ENDOSCOPY;  Service: Endoscopy;  Laterality: N/A;    There were no vitals filed for this visit.                    [facility] Adult PT Treatment/Exercise - 12/27/18 0001      Lumbar Exercises: Aerobic   UBE (Upper Arm Bike)  standing: Level 1x 6 minutes (3/3)   verbal cues for posture, Rt UE fatigue with reverse     Lumbar Exercises: Standing   Row  Strengthening;Both;20 reps;Theraband    Theraband Level (Row)  Level 2 (Red)    Shoulder Extension  Strengthening;Both;20 reps;Theraband    Theraband Level (Shoulder Extension)  Level 2 (Red)      Knee/Hip Exercises: Aerobic   Nustep  Level 4x 8 minutes   PT present to discuss progress     Knee/Hip Exercises: Machines for Strengthening   Cybex Leg  Press  Seat 6: Bil LE 50# 2x10, VC for eccentric control: Single leg 30# 20x each side      Knee/Hip Exercises: Standing   Hip Abduction  Stengthening;Both;2 sets;10 reps;Knee straight    Abduction Limitations  2#, standing on black balance pad    Hip Extension  Stengthening;Both;2 sets;20 reps;Knee straight    Extension Limitations  2# standing on black pad    Rebounder  weight shifting 3 ways: emphasis on posture 3x1 minute      Knee/Hip Exercises: Seated   Long Arc Quad  Strengthening;Both;2 sets;10 reps;Weights    Long Arc Quad Weight  2 lbs.    Sit to Sand  2 sets;10 reps;without UE support      Shoulder Exercises: Seated   Horizontal ABduction  Strengthening;Both;20 reps;Theraband    Theraband Level (Shoulder Horizontal ABduction)  Level 2 (Red)               PT Short Term Goals - 12/25/18 1056      PT SHORT TERM GOAL #1   Title  be independent in initial HEP    Status  Achieved      PT SHORT TERM GOAL #2   Title  improve LE strength to perform stand to sit with controlled descent    Status  Achieved  PT SHORT TERM GOAL #3   Title  verbalize and demonstrate understanding of falls risk and falls prevention    Status  Achieved        PT Long Term Goals - 12/10/18 1024      PT LONG TERM GOAL #1   Title  be independent in advanced HEP    Time  8    Status  New    Target Date  02/04/19      PT LONG TERM GOAL #2   Title  perform single leg stance on Rt and Lt x 8 seconds each to improve safety and balance    Time  8    Period  Weeks    Status  New    Target Date  02/04/19      PT LONG TERM GOAL #3   Title  descend steps with good eccentric control to improve safety on steps at home    Time  8    Period  Weeks    Status  New    Target Date  02/04/19      PT LONG TERM GOAL #4   Title  demonstrate stability with change of direction on level surface    Time  8    Period  Weeks    Status  New    Target Date  02/04/19            Plan -  12/27/18 1047    Clinical Impression Statement  Pt continues to demonstrate improved postural awareness with exercises and is making postural corrections at home. Pt tolerated all exercise in the clinic without difficulty and pt required stand by assistance for safety.  Pt with chronic balance and postural deficits and will continue to benefit from skilled PT for balance, strength and postural endurance.    PT Frequency  2x / week    PT Duration  8 weeks    PT Treatment/Interventions  ADLs/Self Care Home Management;Cryotherapy;Electrical Stimulation;Moist Heat;Gait training;Stair training;Functional mobility training;Therapeutic activities;Therapeutic exercise;Neuromuscular re-education;Balance training;Patient/family education;Manual techniques;Taping    PT Next Visit Plan  Pt will return to PT after a trip to the beach.  Continue to challenge balance, strength and posture    PT Home Exercise Plan  Access Code: [email or web address]     Consulted and Agree with Plan of Care  Patient       Patient will benefit from skilled therapeutic intervention in order to improve the following deficits and impairments:  Abnormal gait, Decreased balance, Decreased endurance, Decreased mobility, Decreased range of motion, Decreased strength, Impaired flexibility, Postural dysfunction, Improper body mechanics, Pain  Visit Diagnosis: 1. Difficulty in walking, not elsewhere classified   2. Muscle weakness (generalized)        Problem List Patient Active Problem List   Diagnosis Date Noted  . Sleep related hypoxia 01/09/2017  . Periodic limb movements of sleep 01/09/2017  . OSA (obstructive sleep apnea) 11/24/2016  . Diabetes mellitus without complication ([place]) 61/95/0932  . Upper GI bleed 07/06/2011  . GI bleeding 07/05/2011  . Anemia associated with acute blood loss 07/05/2011  . DM (diabetes mellitus), type 2 ([place]) 07/05/2011  . HTN (hypertension) 07/05/2011  . Hyperlipidemia 07/05/2011    Sigurd Sos,  PT 12/27/18 11:05 AM  [facility] [address] [place], [place], [address] Phone: [phone]   Fax:  [phone]  Name: SAIVON PROWSE MRN: [id number] Date of Birth: [birth year]

## 2019-01-08 ENCOUNTER — Other Ambulatory Visit: Payer: Self-pay

## 2019-01-08 ENCOUNTER — Ambulatory Visit: Payer: Medicare PPO | Attending: Adult Health

## 2019-01-08 DIAGNOSIS — R262 Difficulty in walking, not elsewhere classified: Secondary | ICD-10-CM | POA: Insufficient documentation

## 2019-01-08 DIAGNOSIS — M6281 Muscle weakness (generalized): Secondary | ICD-10-CM | POA: Diagnosis present

## 2019-01-08 NOTE — Therapy (Signed)
[facility] [address], [place], [place], [address] Phone: [phone]   Fax:  [phone]  Physical Therapy Treatment  Patient Details  Name: Randy [name] MRN: [id number] Date of Birth: 01/22/1941 Referring Provider (PT): Dorothyann Peng, MD   Encounter Date: 01/08/2019  PT End of Session - 01/08/19 1112    Visit Number  6    Date for PT Re-Evaluation  02/04/19    Authorization Type  [employer]    PT Start Time  1026    PT Stop Time  1114    PT Time Calculation (min)  48 min    Activity Tolerance  Patient tolerated treatment well    Behavior During Therapy  [facility] for tasks assessed/performed       Past Medical History:  Diagnosis Date  . Diabetes mellitus   . Hypertension   . OSA (obstructive sleep apnea) 11/24/2016    Past Surgical History:  Procedure Laterality Date  . CATARACT EXTRACTION, BILATERAL Bilateral 2019  . COLONOSCOPY  07/06/2011   Procedure: COLONOSCOPY;  Surgeon: Winfield Cunas., MD;  Location: [facility] ENDOSCOPY;  Service: Endoscopy;  Laterality: N/A;  . ESOPHAGOGASTRODUODENOSCOPY  07/05/2011   Procedure: ESOPHAGOGASTRODUODENOSCOPY (EGD);  Surgeon: Landry Dyke, MD;  Location: [facility] ENDOSCOPY;  Service: Endoscopy;  Laterality: N/A;    There were no vitals filed for this visit.  Subjective Assessment - 01/08/19 1025    Subjective  I did a lot of walking on the beach.  I did some of my exercises but wasn't as consistent.    Pertinent History  falls in the past.  Last name rhymes with "owl"    Currently in Pain?  No/denies         [facility] PT Assessment - 01/08/19 0001      Ambulation/Gait   Gait Comments  Lt SLS: 5 seconds, Rt 13 seconds                   [facility] Adult PT Treatment/Exercise - 01/08/19 0001      Lumbar Exercises: Aerobic   UBE (Upper Arm Bike)  standing: Level 1x 6 minutes (3/3)   verbal cues for posture, Rt UE fatigue with reverse     Lumbar Exercises: Seated   Sit to Stand  20 reps      Knee/Hip Exercises: Aerobic   Nustep  Level 4x 8 minutes   PT present to discuss progress     Knee/Hip Exercises: Machines for Strengthening   Cybex Leg Press  Seat 6: Bil LE 60# 2x10, VC for eccentric control: Single leg 40# 20x each side      Knee/Hip Exercises: Standing   Hip Abduction  Stengthening;Both;2 sets;10 reps;Knee straight    Abduction Limitations  2#, standing on black balance pad    Hip Extension  Stengthening;Both;2 sets;20 reps;Knee straight    Extension Limitations  2# standing on black pad    Rebounder  weight shifting 3 ways: emphasis on posture 3x1 minute    Other Standing Knee Exercises  alternating toe taps 4" step: 2x20 without UE support      Knee/Hip Exercises: Seated   Long Arc Quad  Strengthening;Both;2 sets;10 reps;Weights    Long Arc Quad Weight  2 lbs.    Sit to Sand  2 sets;10 reps;without UE support      Shoulder Exercises: Seated   Horizontal ABduction  Strengthening;Both;20 reps;Theraband    Theraband Level (Shoulder Horizontal ABduction)  Level 3 (Green)  PT Short Term Goals - 12/25/18 1056      PT SHORT TERM GOAL #1   Title  be independent in initial HEP    Status  Achieved      PT SHORT TERM GOAL #2   Title  improve LE strength to perform stand to sit with controlled descent    Status  Achieved      PT SHORT TERM GOAL #3   Title  verbalize and demonstrate understanding of falls risk and falls prevention    Status  Achieved        PT Long Term Goals - 01/08/19 1031      PT LONG TERM GOAL #1   Title  be independent in advanced HEP    Time  8    Period  Weeks    Status  On-going      PT LONG TERM GOAL #2   Title  perform single leg stance on Rt and Lt x 8 seconds each to improve safety and balance    Baseline  5 on Lt, 13 on Rt    Time  8    Period  Weeks    Status  On-going            Plan - 01/08/19 1047    Clinical Impression Statement  Pt was on vacation last week  and has been working on [employer].  Pt reports improved confidence with balance and eccentric control with descending steps and curbs. Pt with improved single leg stance time on both Rt and Lt.  Pt continues to require postural corrections and cueing throughout session although this is improved regarding scapular alignment, head remains forward. Pt tends to ER with single leg press and is able to correct to neutral with verbal cues.  Pt requires supervision during treatment for safety.  Pt will continue to benefit from skilled PT for strength, balance and endurance.    Rehab Potential  Excellent    PT Frequency  2x / week    PT Duration  8 weeks    PT Treatment/Interventions  ADLs/Self Care Home Management;Cryotherapy;Electrical Stimulation;Moist Heat;Gait training;Stair training;Functional mobility training;Therapeutic activities;Therapeutic exercise;Neuromuscular re-education;Balance training;Patient/family education;Manual techniques;Taping    PT Next Visit Plan  balance, strength, posture    PT Home Exercise Plan  Access Code: [email or web address]     Consulted and Agree with Plan of Care  Patient       Patient will benefit from skilled therapeutic intervention in order to improve the following deficits and impairments:  Abnormal gait, Decreased balance, Decreased endurance, Decreased mobility, Decreased range of motion, Decreased strength, Impaired flexibility, Postural dysfunction, Improper body mechanics, Pain  Visit Diagnosis: 1. Difficulty in walking, not elsewhere classified   2. Muscle weakness (generalized)        Problem List Patient Active Problem List   Diagnosis Date Noted  . Sleep related hypoxia 01/09/2017  . Periodic limb movements of sleep 01/09/2017  . OSA (obstructive sleep apnea) 11/24/2016  . Diabetes mellitus without complication ([place]) 76/16/0737  . Upper GI bleed 07/06/2011  . GI bleeding 07/05/2011  . Anemia associated with acute blood loss 07/05/2011  . DM (diabetes  mellitus), type 2 ([place]) 07/05/2011  . HTN (hypertension) 07/05/2011  . Hyperlipidemia 07/05/2011    Sigurd Sos, PT 01/08/19 11:18 AM  [facility] [address], [place], [place], [address] Phone: [phone]   Fax:  [phone]  Name: TRESEAN MATTIX MRN: [id number] Date of Birth: Aug 21, 1940

## 2019-01-10 ENCOUNTER — Other Ambulatory Visit: Payer: Self-pay

## 2019-01-10 ENCOUNTER — Ambulatory Visit: Payer: Medicare PPO

## 2019-01-10 DIAGNOSIS — R262 Difficulty in walking, not elsewhere classified: Secondary | ICD-10-CM

## 2019-01-10 DIAGNOSIS — M6281 Muscle weakness (generalized): Secondary | ICD-10-CM

## 2019-01-10 NOTE — Therapy (Signed)
[facility] [address], [place], [place], [address] Phone: [phone]   Fax:  [phone]  Physical Therapy Treatment  Patient Details  Name: Randy [name] MRN: [id number] Date of Birth: 10-21-1940 Referring Provider (PT): Dorothyann Peng, MD   Encounter Date: 01/10/2019  PT End of Session - 01/10/19 1103    Visit Number  7    Date for PT Re-Evaluation  02/04/19    Authorization Type  [employer]    PT Start Time  1030    PT Stop Time  1109    PT Time Calculation (min)  39 min    Activity Tolerance  Patient tolerated treatment well    Behavior During Therapy  [facility] for tasks assessed/performed       Past Medical History:  Diagnosis Date  . Diabetes mellitus   . Hypertension   . OSA (obstructive sleep apnea) 11/24/2016    Past Surgical History:  Procedure Laterality Date  . CATARACT EXTRACTION, BILATERAL Bilateral 2019  . COLONOSCOPY  07/06/2011   Procedure: COLONOSCOPY;  Surgeon: Winfield Cunas., MD;  Location: [facility] ENDOSCOPY;  Service: Endoscopy;  Laterality: N/A;  . ESOPHAGOGASTRODUODENOSCOPY  07/05/2011   Procedure: ESOPHAGOGASTRODUODENOSCOPY (EGD);  Surgeon: Landry Dyke, MD;  Location: [facility] ENDOSCOPY;  Service: Endoscopy;  Laterality: N/A;    There were no vitals filed for this visit.  Subjective Assessment - 01/10/19 1031    Subjective  I felt good after last session.    Multiple Pain Sites  No                       [facility] Adult PT Treatment/Exercise - 01/10/19 0001      Lumbar Exercises: Aerobic   UBE (Upper Arm Bike)  standing: Level 1x 6 minutes (3/3)   verbal cues for posture, Rt UE fatigue with reverse     Knee/Hip Exercises: Aerobic   Nustep  Level 4x 10 minutes   PT present to discuss progress     Knee/Hip Exercises: Machines for Strengthening   Cybex Leg Press  Seat 6: Bil LE 60# 2x10, VC for eccentric control: Single leg 40# 20x each side      Knee/Hip Exercises:  Standing   Hip Abduction  Stengthening;Both;2 sets;10 reps;Knee straight    Abduction Limitations  3#, standing on black balance pad    Hip Extension  Stengthening;Both;2 sets;20 reps;Knee straight    Extension Limitations  3# standing on black pad    Walking with Sports Cord  25# forward and reverse x 10 each      Knee/Hip Exercises: Seated   Long Arc Quad  Strengthening;Both;2 sets;10 reps;Weights    Long Arc Quad Weight  3 lbs.               PT Short Term Goals - 12/25/18 1056      PT SHORT TERM GOAL #1   Title  be independent in initial HEP    Status  Achieved      PT SHORT TERM GOAL #2   Title  improve LE strength to perform stand to sit with controlled descent    Status  Achieved      PT SHORT TERM GOAL #3   Title  verbalize and demonstrate understanding of falls risk and falls prevention    Status  Achieved        PT Long Term Goals - 01/10/19 1033      PT LONG TERM GOAL #1  Title  be independent in advanced HEP    Time  8    Period  Weeks    Status  On-going            Plan - 01/10/19 1036    Clinical Impression Statement  Pt demonstrates improved postural alignment and requires fewer verbal cues to correct.  Pt continues to have difficulty with descending steps and this is improving at home.  PT worked on this in the clinic today and provided verbal and demo cues for control.  Pt with improved single leg stance this week.  Pt will benefit from continued skilled PT to address balance, posture ad endurance to improve safety at home and in the community.    PT Frequency  2x / week    PT Duration  8 weeks    PT Treatment/Interventions  ADLs/Self Care Home Management;Cryotherapy;Electrical Stimulation;Moist Heat;Gait training;Stair training;Functional mobility training;Therapeutic activities;Therapeutic exercise;Neuromuscular re-education;Balance training;Patient/family education;Manual techniques;Taping    PT Next Visit Plan  balance, strength, posture     PT Home Exercise Plan  Access Code: [email or web address]     Consulted and Agree with Plan of Care  Patient       Patient will benefit from skilled therapeutic intervention in order to improve the following deficits and impairments:  Abnormal gait, Decreased balance, Decreased endurance, Decreased mobility, Decreased range of motion, Decreased strength, Impaired flexibility, Postural dysfunction, Improper body mechanics, Pain  Visit Diagnosis: 1. Difficulty in walking, not elsewhere classified   2. Muscle weakness (generalized)        Problem List Patient Active Problem List   Diagnosis Date Noted  . Sleep related hypoxia 01/09/2017  . Periodic limb movements of sleep 01/09/2017  . OSA (obstructive sleep apnea) 11/24/2016  . Diabetes mellitus without complication ([place]) 52/77/8242  . Upper GI bleed 07/06/2011  . GI bleeding 07/05/2011  . Anemia associated with acute blood loss 07/05/2011  . DM (diabetes mellitus), type 2 ([place]) 07/05/2011  . HTN (hypertension) 07/05/2011  . Hyperlipidemia 07/05/2011     Randy [name], PT 01/10/19 11:07 AM  [facility] [address], [place], [place], [address] Phone: [phone]   Fax:  [phone]  Name: Randy [name] MRN: [id number] Date of Birth: 03-15-1941

## 2019-01-17 ENCOUNTER — Ambulatory Visit: Payer: Medicare PPO

## 2019-01-17 ENCOUNTER — Other Ambulatory Visit: Payer: Self-pay

## 2019-01-17 DIAGNOSIS — R262 Difficulty in walking, not elsewhere classified: Secondary | ICD-10-CM

## 2019-01-17 DIAGNOSIS — M6281 Muscle weakness (generalized): Secondary | ICD-10-CM

## 2019-01-17 NOTE — Therapy (Signed)
[facility] [address] [place], [place], [address] Phone: [phone]   Fax:  [phone]  Physical Therapy Treatment  Patient Details  Name: Randy [name] MRN: [id number] Date of Birth: [birth year] Referring Provider (PT): Dorothyann Peng, MD   Encounter Date: 01/17/2019  PT End of Session - 01/17/19 1114    Visit Number  8    Date for PT Re-Evaluation  02/04/19    Authorization Type  [employer]    PT Start Time  1031    PT Stop Time  1114    PT Time Calculation (min)  43 min    Activity Tolerance  Patient tolerated treatment well    Behavior During Therapy  [facility] for tasks assessed/performed       Past Medical History:  Diagnosis Date  . Diabetes mellitus   . Hypertension   . OSA (obstructive sleep apnea) 11/24/2016    Past Surgical History:  Procedure Laterality Date  . CATARACT EXTRACTION, BILATERAL Bilateral 2019  . COLONOSCOPY  07/06/2011   Procedure: COLONOSCOPY;  Surgeon: Winfield Cunas., MD;  [facility] ENDOSCOPY;  Service: Endoscopy;  Laterality: N/A;  . ESOPHAGOGASTRODUODENOSCOPY  07/05/2011   Procedure: ESOPHAGOGASTRODUODENOSCOPY (EGD);  Surgeon: Landry Dyke, MD;  Location: [facility] ENDOSCOPY;  Service: Endoscopy;  Laterality: N/A;    There were no vitals filed for this visit.  Subjective Assessment - 01/17/19 1035    Subjective  I think therapy is helping me.  I have more energy now.    Patient Stated Goals  improve balance, reduce falls risk    Currently in Pain?  No/denies                       [facility] Adult PT Treatment/Exercise - 01/17/19 0001      Knee/Hip Exercises: Aerobic   Nustep  Level 4x 10 minutes   PT present to discuss progress     Knee/Hip Exercises: Machines for Strengthening   Cybex Leg Press  Seat 6: Bil LE 75# 2x10, VC for eccentric control: Single leg 50# 20x each side      Knee/Hip Exercises: Standing   Hip Abduction  Stengthening;Both;2 sets;10  reps;Knee straight    Abduction Limitations  3#, standing on black balance pad    Hip Extension  Stengthening;Both;2 sets;20 reps;Knee straight    Extension Limitations  3# standing on black pad    Forward Step Up  Both;2 sets;10 reps;Hand Hold: 2;Step Height: 6"    Step Down  1 set;10 reps;Left;Right;Hand Hold: 1;Step Height: 6"    Walking with Sports Cord  25# forward and reverse x 10 each, sidestepping x 5 each    Other Standing Knee Exercises  alternating toe taps 6" step: 2x20 without UE support               PT Short Term Goals - 12/25/18 1056      PT SHORT TERM GOAL #1   Title  be independent in initial HEP    Status  Achieved      PT SHORT TERM GOAL #2   Title  improve LE strength to perform stand to sit with controlled descent    Status  Achieved      PT SHORT TERM GOAL #3   Title  verbalize and demonstrate understanding of falls risk and falls prevention    Status  Achieved        PT Long Term Goals - 01/17/19 1040  PT LONG TERM GOAL #1   Title  be independent in advanced HEP    Time  8    Period  Weeks    Status  On-going            Plan - 01/17/19 1048    Clinical Impression Statement  Pt reports improved energy levels since the start of therapy.  Pt was able to increase to 3# weights with standing strength/balance exercises.  Pt with improved eccentric control with descending steps and time with single leg stance.  Pt is able to demonstrate good form and control with sit to stand.  Pt requires intermittent cueing for technique and stand by assistance for safety with exercise in the clinic.  Pt will benefit from skilled PT for strength, balance and endurance to address chronic balance and endurance deficits.    PT Frequency  2x / week    PT Duration  8 weeks    PT Treatment/Interventions  ADLs/Self Care Home Management;Cryotherapy;Electrical Stimulation;Moist Heat;Gait training;Stair training;Functional mobility training;Therapeutic  activities;Therapeutic exercise;Neuromuscular re-education;Balance training;Patient/family education;Manual techniques;Taping    PT Next Visit Plan  balance, strength, posture    PT Home Exercise Plan  Access Code: [email or web address]     Consulted and Agree with Plan of Care  Patient       Patient will benefit from skilled therapeutic intervention in order to improve the following deficits and impairments:  Abnormal gait, Decreased balance, Decreased endurance, Decreased mobility, Decreased range of motion, Decreased strength, Impaired flexibility, Postural dysfunction, Improper body mechanics, Pain  Visit Diagnosis: 1. Difficulty in walking, not elsewhere classified   2. Muscle weakness (generalized)        Problem List Patient Active Problem List   Diagnosis Date Noted  . Sleep related hypoxia 01/09/2017  . Periodic limb movements of sleep 01/09/2017  . OSA (obstructive sleep apnea) 11/24/2016  . Diabetes mellitus without complication ([place]) 60/73/7106  . Upper GI bleed 07/06/2011  . GI bleeding 07/05/2011  . Anemia associated with acute blood loss 07/05/2011  . DM (diabetes mellitus), type 2 ([place]) 07/05/2011  . HTN (hypertension) 07/05/2011  . Hyperlipidemia 07/05/2011     Sigurd Sos, PT 01/17/19 11:15 AM  [facility] [address], [place], [place], [address] Phone: [phone]   Fax:  [phone]  Name: Randy [name] MRN: [id number] Date of Birth: 09-Jan-1941

## 2019-01-22 ENCOUNTER — Ambulatory Visit: Payer: Medicare PPO

## 2019-01-22 ENCOUNTER — Other Ambulatory Visit: Payer: Self-pay

## 2019-01-22 DIAGNOSIS — M6281 Muscle weakness (generalized): Secondary | ICD-10-CM

## 2019-01-22 DIAGNOSIS — R262 Difficulty in walking, not elsewhere classified: Secondary | ICD-10-CM

## 2019-01-22 NOTE — Therapy (Signed)
[facility] [address] [place], [place], [address] Phone: [phone]   Fax:  [phone]  Physical Therapy Treatment  Patient Details  Name: Randy [name] MRN: [id number] Date of Birth: [birth year] Referring Provider (PT): Dorothyann Peng, MD   Encounter Date: 01/22/2019  PT End of Session - 01/22/19 1108    Visit Number  9    Date for PT Re-Evaluation  02/04/19    Authorization Type  [employer]    PT Start Time  1029    PT Stop Time  1115    PT Time Calculation (min)  46 min    Activity Tolerance  Patient tolerated treatment well    Behavior During Therapy  [facility] for tasks assessed/performed       Past Medical History:  Diagnosis Date  . Diabetes mellitus   . Hypertension   . OSA (obstructive sleep apnea) 11/24/2016    Past Surgical History:  Procedure Laterality Date  . CATARACT EXTRACTION, BILATERAL Bilateral 2019  . COLONOSCOPY  07/06/2011   Procedure: COLONOSCOPY;  Surgeon: Winfield Cunas., MD;  Location: [facility] ENDOSCOPY;  Service: Endoscopy;  Laterality: N/A;  . ESOPHAGOGASTRODUODENOSCOPY  07/05/2011   Procedure: ESOPHAGOGASTRODUODENOSCOPY (EGD);  Surgeon: Landry Dyke, MD;  Location: [facility] ENDOSCOPY;  Service: Endoscopy;  Laterality: N/A;    There were no vitals filed for this visit.  Subjective Assessment - 01/22/19 1030    Subjective  I'm feeling a little wobbly today and I'm not sure why.  I walked for 15 minutes on the treadmill.    Patient Stated Goals  improve balance, reduce falls risk    Currently in Pain?  No/denies                       [facility] Adult PT Treatment/Exercise - 01/22/19 0001      Lumbar Exercises: Aerobic   UBE (Upper Arm Bike)  standing: Level 1x 6 minutes (3/3)   verbal cues for posture, Rt UE fatigue with reverse     Knee/Hip Exercises: Aerobic   Nustep  Level 4x 10 minutes   PT present to discuss progress     Knee/Hip Exercises: Machines for  Strengthening   Cybex Leg Press  Seat 6: Bil LE 75# 2x10, VC for eccentric control: Single leg 50# 20x each side      Knee/Hip Exercises: Standing   Hip Abduction  Stengthening;Both;2 sets;10 reps;Knee straight    Abduction Limitations  3#, standing on black balance pad    Hip Extension  Stengthening;Both;2 sets;20 reps;Knee straight    Extension Limitations  3# standing on black pad    Walking with Sports Cord  25# forward and reverse x 10 each, sidestepping x 5 each    Other Standing Knee Exercises  tandem stance on black pad: 1 minute each    Other Standing Knee Exercises  alternating toe taps 6" step: 2x20 without UE support   standing on black balance pad         Balance Exercises - 01/22/19 1051      Balance Exercises: Standing   SLS  Foam/compliant surface;Eyes open;4 reps   10 second hold Rt and Lt         PT Short Term Goals - 12/25/18 1056      PT SHORT TERM GOAL #1   Title  be independent in initial HEP    Status  Achieved      PT SHORT TERM GOAL #2  Title  improve LE strength to perform stand to sit with controlled descent    Status  Achieved      PT SHORT TERM GOAL #3   Title  verbalize and demonstrate understanding of falls risk and falls prevention    Status  Achieved        PT Long Term Goals - 01/17/19 1040      PT LONG TERM GOAL #1   Title  be independent in advanced HEP    Time  8    Period  Weeks    Status  On-going            Plan - 01/22/19 1034    Clinical Impression Statement  Pt is making steady progress with balance and endurance.  Pt reports improved energy levels with yardwork and community ambulation.  Pt demonstrates improved eccentric control with step-downs and single limb activity.  Pt requires intermittent verbal cues for posture in standing although this is significantly improved since the start of care.  Pt will continue to benefit from skilled PT for strength, balance, and endurance to address chronic balance and  endurance deficits.    PT Frequency  2x / week    PT Duration  8 weeks    PT Treatment/Interventions  ADLs/Self Care Home Management;Cryotherapy;Electrical Stimulation;Moist Heat;Gait training;Stair training;Functional mobility training;Therapeutic activities;Therapeutic exercise;Neuromuscular re-education;Balance training;Patient/family education;Manual techniques;Taping    PT Next Visit Plan  balance, strength, posture    PT Home Exercise Plan  Access Code: [email or web address]        Patient will benefit from skilled therapeutic intervention in order to improve the following deficits and impairments:  Abnormal gait, Decreased balance, Decreased endurance, Decreased mobility, Decreased range of motion, Decreased strength, Impaired flexibility, Postural dysfunction, Improper body mechanics, Pain  Visit Diagnosis: 1. Muscle weakness (generalized)   2. Difficulty in walking, not elsewhere classified        Problem List Patient Active Problem List   Diagnosis Date Noted  . Sleep related hypoxia 01/09/2017  . Periodic limb movements of sleep 01/09/2017  . OSA (obstructive sleep apnea) 11/24/2016  . Diabetes mellitus without complication ([place]) 13/02/6577  . Upper GI bleed 07/06/2011  . GI bleeding 07/05/2011  . Anemia associated with acute blood loss 07/05/2011  . DM (diabetes mellitus), type 2 ([place]) 07/05/2011  . HTN (hypertension) 07/05/2011  . Hyperlipidemia 07/05/2011   Sigurd Sos, PT 01/22/19 11:10 AM  [facility] [address] [place], [place], [address] Phone: [phone]   Fax:  [phone]  Name: Randy [name] MRN: [id number] Date of Birth: [birth year]

## 2019-01-24 ENCOUNTER — Ambulatory Visit: Payer: Medicare PPO

## 2019-01-24 ENCOUNTER — Other Ambulatory Visit: Payer: Self-pay

## 2019-01-24 DIAGNOSIS — M6281 Muscle weakness (generalized): Secondary | ICD-10-CM

## 2019-01-24 DIAGNOSIS — R262 Difficulty in walking, not elsewhere classified: Secondary | ICD-10-CM

## 2019-01-24 NOTE — Therapy (Signed)
[facility] [address] [place], [place], [address] Phone: [phone]   Fax:  [phone]  Physical Therapy Treatment  Patient Details  Name: Randy [name] MRN: [id number] Date of Birth: [birth year] Referring Provider (Randy [name]): Dorothyann Peng, MD  Progress Note Reporting Period 12/10/2018 to 01/22/2019  See note below for Objective Data and Assessment of Progress/Goals.      Encounter Date: 01/24/2019  Randy [name] End of Session - 01/24/19 1104    Visit Number  10    Date for Randy [name] Re-Evaluation  02/04/19    Authorization Type  [employer]    Randy [name] Start Time  1029    Randy [name] Stop Time  1110    Randy [name] Time Calculation (min)  41 min    Activity Tolerance  Patient tolerated treatment well    Behavior During Therapy  [facility] for tasks assessed/performed       Past Medical History:  Diagnosis Date  . Diabetes mellitus   . Hypertension   . OSA (obstructive sleep apnea) 11/24/2016    Past Surgical History:  Procedure Laterality Date  . CATARACT EXTRACTION, BILATERAL Bilateral 2019  . COLONOSCOPY  07/06/2011   Procedure: COLONOSCOPY;  Surgeon: Winfield Cunas., MD;  Location: [facility] ENDOSCOPY;  Service: Endoscopy;  Laterality: N/A;  . ESOPHAGOGASTRODUODENOSCOPY  07/05/2011   Procedure: ESOPHAGOGASTRODUODENOSCOPY (EGD);  Surgeon: Landry Dyke, MD;  Location: [facility] ENDOSCOPY;  Service: Endoscopy;  Laterality: N/A;    There were no vitals filed for this visit.      [facility] Randy [name] Assessment - 01/24/19 0001      Assessment   Medical Diagnosis  gait instability    Referring Provider (Randy [name])  Dorothyann Peng, MD    Onset Date/Surgical Date  12/10/15      Transfers   Five time sit to stand comments   9.1 without hands      Ambulation/Gait   Gait Comments  Lt SLS: 7.75 seconds, Rt 30 seconds                   [facility] Adult Randy [name] Treatment/Exercise - 01/24/19 0001      Lumbar Exercises: Aerobic   UBE (Upper Arm Bike)  standing: Level 1x 6  minutes (3/3)   verbal cues for posture     Knee/Hip Exercises: Aerobic   Nustep  Level 4x 10 minutes   Randy [name] present to discuss progress     Knee/Hip Exercises: Machines for Strengthening   Cybex Leg Press  Seat 6: Bil LE 75# 2x10, VC for eccentric control: Single leg 50# 20x each side      Knee/Hip Exercises: Standing   Hip Abduction  Stengthening;Both;2 sets;10 reps;Knee straight    Abduction Limitations  3#, standing on black balance pad    Hip Extension  Stengthening;Both;2 sets;20 reps;Knee straight    Extension Limitations  3# standing on black pad    Walking with Sports Cord  25# forward and reverse x 10 each, sidestepping x 5 each    Other Standing Knee Exercises  tandem stance on black pad: 1 minute each          Balance Exercises - 01/24/19 1054      Balance Exercises: Standing   SLS  Foam/compliant surface;Eyes open;4 reps   10 second hold Rt and Lt         Randy [name] Short Term Goals - 12/25/18 1056      Randy [name] SHORT TERM GOAL #1   Title  be independent in initial  HEP    Status  Achieved      Randy [name] SHORT TERM GOAL #2   Title  improve LE strength to perform stand to sit with controlled descent    Status  Achieved      Randy [name] SHORT TERM GOAL #3   Title  verbalize and demonstrate understanding of falls risk and falls prevention    Status  Achieved        Randy [name] Long Term Goals - 01/24/19 1034      Randy [name] LONG TERM GOAL #1   Title  be independent in advanced HEP    Baseline  further advancement is needed    Time  8    Period  Weeks    Status  On-going      Randy [name] LONG TERM GOAL #2   Title  perform single leg stance on Rt and Lt x 8 seconds each to improve safety and balance    Baseline  Rt 30 seconds, Lt 7 seconds    Time  8    Period  Weeks    Status  On-going      Randy [name] LONG TERM GOAL #3   Title  descend steps with good eccentric control to improve safety on steps at home    Baseline  demonstrates moderate eccentric control    Time  8    Period  Weeks    Status  On-going       Randy [name] LONG TERM GOAL #4   Title  demonstrate stability with change of direction on level surface    Status  Achieved            Plan - 01/24/19 1052    Clinical Impression Statement  Randy [name] is making excellent gains regarding balance and stability.  Randy [name] performs 5x sit to stand in 9.1 seconds, single leg stance is 30 seconds on the Rt and 7 on the Lt.  Randy [name] with improved stability with change of direction when walking.  Randy [name] does continue to demonstrate instability and reduced eccentric control with descending steps. Randy [name] requires stand by assistance for safety with resisted walking. Randy [name] will benefit from 2 more sessions to continue to challenge balance for improved safety and finalize HEP.    Rehab Potential  Excellent    Randy [name] Frequency  2x / week    Randy [name] Duration  8 weeks    Randy [name] Treatment/Interventions  ADLs/Self Care Home Management;Cryotherapy;Electrical Stimulation;Moist Heat;Gait training;Stair training;Functional mobility training;Therapeutic activities;Therapeutic exercise;Neuromuscular re-education;Balance training;Patient/family education;Manual techniques;Taping    Randy [name] Next Visit Plan  2 more sessions, finalize HEP    Randy [name] Home Exercise Plan  Access Code: [email or web address]     Consulted and Agree with Plan of Care  Patient       Patient will benefit from skilled therapeutic intervention in order to improve the following deficits and impairments:  Abnormal gait, Decreased balance, Decreased endurance, Decreased mobility, Decreased range of motion, Decreased strength, Impaired flexibility, Postural dysfunction, Improper body mechanics, Pain  Visit Diagnosis: 1. Difficulty in walking, not elsewhere classified   2. Muscle weakness (generalized)        Problem List Patient Active Problem List   Diagnosis Date Noted  . Sleep related hypoxia 01/09/2017  . Periodic limb movements of sleep 01/09/2017  . OSA (obstructive sleep apnea) 11/24/2016  . Diabetes mellitus without complication ([place])  51/88/4166  . Upper GI bleed 07/06/2011  . GI bleeding 07/05/2011  . Anemia associated with acute blood loss 07/05/2011  . DM (diabetes mellitus), type 2 ([place])  07/05/2011  . HTN (hypertension) 07/05/2011  . Hyperlipidemia 07/05/2011    Randy [name], Randy [name] 01/24/19 11:06 AM  [facility] [address], [place], [place], [address] Phone: [phone]   Fax:  [phone]  Name: Randy [name] MRN: [id number] Date of Birth: 03/31/1941

## 2019-01-29 ENCOUNTER — Ambulatory Visit: Payer: Medicare PPO

## 2019-01-29 ENCOUNTER — Other Ambulatory Visit: Payer: Self-pay

## 2019-01-29 DIAGNOSIS — R262 Difficulty in walking, not elsewhere classified: Secondary | ICD-10-CM

## 2019-01-29 DIAGNOSIS — M6281 Muscle weakness (generalized): Secondary | ICD-10-CM

## 2019-01-29 NOTE — Patient Instructions (Signed)
Access Code: [email or web address]  URL: [email or web address]  Date: 01/29/2019  Prepared by: Sigurd Sos    Tandem Stance with Support - 5 reps - 10 hold - 2x daily - 7x weekly

## 2019-01-29 NOTE — Therapy (Signed)
[facility] [address] [place], [place], [address] Phone: [phone]   Fax:  [phone]  Physical Therapy Treatment  Patient Details  Name: Randy [name] MRN: [id number] Date of Birth: Aug 08, 1940 Referring Provider (PT): Dorothyann Peng, MD   Encounter Date: 01/29/2019  PT End of Session - 01/29/19 1110    Visit Number  11    Date for PT Re-Evaluation  02/04/19    Authorization Type  [employer]    PT Start Time  1031    PT Stop Time  1112    PT Time Calculation (min)  41 min    Activity Tolerance  Patient tolerated treatment well    Behavior During Therapy  [facility] for tasks assessed/performed       Past Medical History:  Diagnosis Date  . Diabetes mellitus   . Hypertension   . OSA (obstructive sleep apnea) 11/24/2016    Past Surgical History:  Procedure Laterality Date  . CATARACT EXTRACTION, BILATERAL Bilateral 2019  . COLONOSCOPY  07/06/2011   Procedure: COLONOSCOPY;  Surgeon: Winfield Cunas., MD;  Location: [facility] ENDOSCOPY;  Service: Endoscopy;  Laterality: N/A;  . ESOPHAGOGASTRODUODENOSCOPY  07/05/2011   Procedure: ESOPHAGOGASTRODUODENOSCOPY (EGD);  Surgeon: Landry Dyke, MD;  Location: [facility] ENDOSCOPY;  Service: Endoscopy;  Laterality: N/A;    There were no vitals filed for this visit.  Subjective Assessment - 01/29/19 1034    Subjective  I am doing my exercises and I am doing good.    Currently in Pain?  No/denies                       [facility] Adult PT Treatment/Exercise - 01/29/19 0001      Lumbar Exercises: Aerobic   UBE (Upper Arm Bike)  standing: Level 1x 6 minutes (3/3)   verbal cues for posture     Knee/Hip Exercises: Aerobic   Nustep  Level 4x 10 minutes   PT present to discuss progress     Knee/Hip Exercises: Machines for Strengthening   Cybex Leg Press  Seat 6: Bil LE 75# 2x10, VC for eccentric control: Single leg 50# 20x each side      Knee/Hip Exercises: Standing   Hip Abduction  Stengthening;Both;2 sets;10 reps;Knee straight    Abduction Limitations  3#, standing on black balance pad    Hip Extension  Stengthening;Both;2 sets;20 reps;Knee straight    Extension Limitations  3# standing on black pad    Walking with Sports Cord  25# forward and reverse x 10 each, sidestepping x 5 each    Other Standing Knee Exercises  tandem stance on black pad: 1 minute each             PT Education - 01/29/19 1047    Education Details  Access Code: [email or web address]    Person(s) Educated  Patient    Methods  Explanation;Demonstration;Handout    Comprehension  Verbalized understanding;Returned demonstration       PT Short Term Goals - 12/25/18 1056      PT SHORT TERM GOAL #1   Title  be independent in initial HEP    Status  Achieved      PT SHORT TERM GOAL #2   Title  improve LE strength to perform stand to sit with controlled descent    Status  Achieved      PT SHORT TERM GOAL #3   Title  verbalize and demonstrate understanding of falls risk and falls prevention  Status  Achieved        PT Long Term Goals - 01/24/19 1034      PT LONG TERM GOAL #1   Title  be independent in advanced HEP    Baseline  further advancement is needed    Time  8    Period  Weeks    Status  On-going      PT LONG TERM GOAL #2   Title  perform single leg stance on Rt and Lt x 8 seconds each to improve safety and balance    Baseline  Rt 30 seconds, Lt 7 seconds    Time  8    Period  Weeks    Status  On-going      PT LONG TERM GOAL #3   Title  descend steps with good eccentric control to improve safety on steps at home    Baseline  demonstrates moderate eccentric control    Time  8    Period  Weeks    Status  On-going      PT LONG TERM GOAL #4   Title  demonstrate stability with change of direction on level surface    Status  Achieved            Plan - 01/29/19 1041    Clinical Impression Statement  Pt continues make gains regarding strength and  endurance.  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  Pt requires stand by assistance for safety with exercise today.  Pt will attend 1 more session to finalize HEP for balance and strength.    PT Frequency  2x / week    PT Duration  8 weeks    PT Treatment/Interventions  ADLs/Self Care Home Management;Cryotherapy;Electrical Stimulation;Moist Heat;Gait training;Stair training;Functional mobility training;Therapeutic activities;Therapeutic exercise;Neuromuscular re-education;Balance training;Patient/family education;Manual techniques;Taping    PT Next Visit Plan  1 more session- D/C next visit.    PT Home Exercise Plan  Access Code: [email or web address]     Consulted and Agree with Plan of Care  Patient       Patient will benefit from skilled therapeutic intervention in order to improve the following deficits and impairments:  Abnormal gait, Decreased balance, Decreased endurance, Decreased mobility, Decreased range of motion, Decreased strength, Impaired flexibility, Postural dysfunction, Improper body mechanics, Pain  Visit Diagnosis: 1. Difficulty in walking, not elsewhere classified   2. Muscle weakness (generalized)        Problem List Patient Active Problem List   Diagnosis Date Noted  . Sleep related hypoxia 01/09/2017  . Periodic limb movements of sleep 01/09/2017  . OSA (obstructive sleep apnea) 11/24/2016  . Diabetes mellitus without complication ([place]) 75/04/2584  . Upper GI bleed 07/06/2011  . GI bleeding 07/05/2011  . Anemia associated with acute blood loss 07/05/2011  . DM (diabetes mellitus), type 2 ([place]) 07/05/2011  . HTN (hypertension) 07/05/2011  . Hyperlipidemia 07/05/2011    Sigurd Sos, PT 01/29/19 11:11 AM  [facility] [address] [place], [place], [address] Phone: [phone]   Fax:  [phone]  Name: Randy [name] MRN:  [id number] Date of Birth: 1940/11/07

## 2019-01-31 ENCOUNTER — Ambulatory Visit: Payer: Medicare PPO

## 2019-01-31 ENCOUNTER — Other Ambulatory Visit: Payer: Self-pay

## 2019-01-31 DIAGNOSIS — M6281 Muscle weakness (generalized): Secondary | ICD-10-CM

## 2019-01-31 DIAGNOSIS — R262 Difficulty in walking, not elsewhere classified: Secondary | ICD-10-CM | POA: Diagnosis not present

## 2019-01-31 NOTE — Therapy (Signed)
[facility] [address] [place], [place], [address] Phone: [phone]   Fax:  [phone]  Physical Therapy Treatment  Patient Details  Name: Randy [name] MRN: [id number] Date of Birth: [birth year] Referring Provider (PT): Dorothyann Peng, MD   Encounter Date: 01/31/2019  PT End of Session - 01/31/19 1107    Visit Number  12    PT Start Time  1026    PT Stop Time  1106    PT Time Calculation (min)  40 min    Activity Tolerance  Patient tolerated treatment well    Behavior During Therapy  [facility] for tasks assessed/performed       Past Medical History:  Diagnosis Date  . Diabetes mellitus   . Hypertension   . OSA (obstructive sleep apnea) 11/24/2016    Past Surgical History:  Procedure Laterality Date  . CATARACT EXTRACTION, BILATERAL Bilateral 2019  . COLONOSCOPY  07/06/2011   Procedure: COLONOSCOPY;  Surgeon: Winfield Cunas., MD;  Location: [facility] ENDOSCOPY;  Service: Endoscopy;  Laterality: N/A;  . ESOPHAGOGASTRODUODENOSCOPY  07/05/2011   Procedure: ESOPHAGOGASTRODUODENOSCOPY (EGD);  Surgeon: Landry Dyke, MD;  Location: [facility] ENDOSCOPY;  Service: Endoscopy;  Laterality: N/A;    There were no vitals filed for this visit.  Subjective Assessment - 01/31/19 1026    Subjective  I am ready to D/C to HEP.    Pertinent History  falls in the past.  Last name rhymes with "owl"    Currently in Pain?  No/denies         [facility] PT Assessment - 01/31/19 0001      Assessment   Medical Diagnosis  gait instability    Referring Provider (PT)  Dorothyann Peng, MD    Onset Date/Surgical Date  12/10/15      Transfers   Five time sit to stand comments   9.1 without hands      Ambulation/Gait   Gait Comments  Lt SLS: 8.6 seconds, Rt 30 seconds                   [facility] Adult PT Treatment/Exercise - 01/31/19 0001      Lumbar Exercises: Aerobic   UBE (Upper Arm Bike)  standing: Level 1x 6 minutes (3/3)    verbal cues for posture     Knee/Hip Exercises: Aerobic   Nustep  Level 4x 10 minutes   PT present to discuss progress     Knee/Hip Exercises: Machines for Strengthening   Cybex Leg Press  Seat 6: Bil LE 75# 2x10, VC for eccentric control: Single leg 50# 20x each side      Knee/Hip Exercises: Standing   Hip Abduction  Stengthening;Both;2 sets;10 reps;Knee straight    Abduction Limitations  3#, standing on black balance pad    Hip Extension  Stengthening;Both;2 sets;20 reps;Knee straight    Extension Limitations  3# standing on black pad    Walking with Sports Cord  25# forward and reverse x 10 each, sidestepping x 5 each    Other Standing Knee Exercises  tandem stance on black pad: 1 minute each               PT Short Term Goals - 01/31/19 1029      PT SHORT TERM GOAL #1   Title  be independent in initial HEP    Status  Achieved      PT SHORT TERM GOAL #2   Title  improve LE strength to  perform stand to sit with controlled descent    Status  Achieved      PT SHORT TERM GOAL #3   Title  verbalize and demonstrate understanding of falls risk and falls prevention    Status  Achieved        PT Long Term Goals - 01/31/19 1030      PT LONG TERM GOAL #1   Title  be independent in advanced HEP    Status  Achieved      PT LONG TERM GOAL #2   Title  perform single leg stance on Rt and Lt x 8 seconds each to improve safety and balance    Baseline  Rt 30 seconds, Lt 8 seconds    Status  Achieved      PT LONG TERM GOAL #3   Title  descend steps with good eccentric control to improve safety on steps at home    Status  Achieved      PT LONG TERM GOAL #4   Title  demonstrate stability with change of direction on level surface    Status  Achieved            Plan - 01/31/19 1040    Clinical Impression Statement  Pt reports significant improvement in overall balance since the start of care.  Pt demonstrates improved time in single limb stance and eccentric control  with descending steps and stand to sit transition.  Pt requires minor verbal cues during exercise for technique and supervision for safety and to monitor for fatigue. Pt has HEP in place to address strength, balance and endurance for continued gains after discharge.    PT Next Visit Plan  D/C PT to HEP    PT Home Exercise Plan  Access Code: [email or web address]     Consulted and Agree with Plan of Care  Patient       Patient will benefit from skilled therapeutic intervention in order to improve the following deficits and impairments:     Visit Diagnosis: 1. Muscle weakness (generalized)   2. Difficulty in walking, not elsewhere classified        Problem List Patient Active Problem List   Diagnosis Date Noted  . Sleep related hypoxia 01/09/2017  . Periodic limb movements of sleep 01/09/2017  . OSA (obstructive sleep apnea) 11/24/2016  . Diabetes mellitus without complication ([place]) 27/74/1287  . Upper GI bleed 07/06/2011  . GI bleeding 07/05/2011  . Anemia associated with acute blood loss 07/05/2011  . DM (diabetes mellitus), type 2 ([place]) 07/05/2011  . HTN (hypertension) 07/05/2011  . Hyperlipidemia 07/05/2011    PHYSICAL THERAPY DISCHARGE SUMMARY  Visits from Start of Care: 12  Current functional level related to goals / functional outcomes: See above for current status.     Remaining deficits: Pt with chronic posture and balance deficits.  Both are significantly improved and pt has HEP in place to address.     Education / Equipment: HEP, posture, falls risk Plan: Patient agrees to discharge.  Patient goals were not met. Patient is being discharged due to meeting the stated rehab goals.  ?????        Sigurd Sos, PT 01/31/19 11:08 AM  [facility] [address] [place], [place], [address] Phone: [phone]   Fax:  [phone]  Name: Randy [name] MRN: [id number] Date of Birth: 08-Jan-1941

## 2019-02-05 ENCOUNTER — Emergency Department (HOSPITAL_COMMUNITY)
Admission: EM | Admit: 2019-02-05 | Discharge: 2019-02-05 | Disposition: A | Discharge status: Home / Self Care | Payer: Medicare PPO | Attending: Emergency Medicine | Admitting: Emergency Medicine

## 2019-02-05 ENCOUNTER — Other Ambulatory Visit: Payer: Self-pay

## 2019-02-05 ENCOUNTER — Encounter (HOSPITAL_COMMUNITY): Payer: Self-pay | Admitting: Emergency Medicine

## 2019-02-05 DIAGNOSIS — Z87891 Personal history of nicotine dependence: Secondary | ICD-10-CM | POA: Insufficient documentation

## 2019-02-05 DIAGNOSIS — R410 Disorientation, unspecified: Secondary | ICD-10-CM | POA: Diagnosis not present

## 2019-02-05 DIAGNOSIS — R0602 Shortness of breath: Secondary | ICD-10-CM | POA: Diagnosis not present

## 2019-02-05 DIAGNOSIS — R300 Dysuria: Secondary | ICD-10-CM | POA: Diagnosis present

## 2019-02-05 DIAGNOSIS — N390 Urinary tract infection, site not specified: Secondary | ICD-10-CM

## 2019-02-05 DIAGNOSIS — R000 Tachycardia, unspecified: Secondary | ICD-10-CM | POA: Insufficient documentation

## 2019-02-05 DIAGNOSIS — Z79899 Other long term (current) drug therapy: Secondary | ICD-10-CM | POA: Insufficient documentation

## 2019-02-05 DIAGNOSIS — E119 Type 2 diabetes mellitus without complications: Secondary | ICD-10-CM | POA: Diagnosis not present

## 2019-02-05 DIAGNOSIS — R32 Unspecified urinary incontinence: Secondary | ICD-10-CM | POA: Diagnosis not present

## 2019-02-05 DIAGNOSIS — Z7984 Long term (current) use of oral hypoglycemic drugs: Secondary | ICD-10-CM | POA: Insufficient documentation

## 2019-02-05 DIAGNOSIS — I10 Essential (primary) hypertension: Secondary | ICD-10-CM | POA: Diagnosis not present

## 2019-02-05 LAB — COMPREHENSIVE METABOLIC PANEL
ALT: 14 U/L (ref 0–44)
AST: 15 U/L (ref 15–41)
Albumin: 3.8 g/dL (ref 3.5–5.0)
Alkaline Phosphatase: 49 U/L (ref 38–126)
Anion gap: 9 (ref 5–15)
BUN: 13 mg/dL (ref 8–23)
CO2: 32 mmol/L (ref 22–32)
Calcium: 10.2 mg/dL (ref 8.9–10.3)
Chloride: 91 mmol/L — ABNORMAL LOW (ref 98–111)
Creatinine, Ser: 1.15 mg/dL (ref 0.61–1.24)
GFR calc Af Amer: >60 mL/min (ref >60)
GFR calc non Af Amer: >60 mL/min (ref >60)
Glucose, Bld: 185 mg/dL — ABNORMAL HIGH (ref 70–99)
Potassium: 3.9 mmol/L (ref 3.5–5.1)
Sodium: 132 mmol/L — ABNORMAL LOW (ref 135–145)
Total Bilirubin: 1.1 mg/dL (ref 0.3–1.2)
Total Protein: 6.3 g/dL — ABNORMAL LOW (ref 6.5–8.1)

## 2019-02-05 LAB — URINALYSIS, ROUTINE W REFLEX MICROSCOPIC
Bacteria, UA: NONE SEEN
Bilirubin Urine: NEGATIVE
Glucose, UA: NEGATIVE mg/dL
Ketones, ur: NEGATIVE mg/dL
Nitrite: POSITIVE — AB
Protein, ur: 100 mg/dL — AB
Specific Gravity, Urine: 1.013 (ref 1.005–1.030)
WBC, UA: >50 WBC/hpf — ABNORMAL HIGH (ref 0–5)
pH: 8 (ref 5.0–8.0)

## 2019-02-05 LAB — CBC WITH DIFFERENTIAL/PLATELET
Abs Immature Granulocytes: 0.11 10*3/uL — ABNORMAL HIGH (ref 0.00–0.07)
Basophils Absolute: 0 10*3/uL (ref 0.0–0.1)
Basophils Relative: 0 %
Eosinophils Absolute: 0 10*3/uL (ref 0.0–0.5)
Eosinophils Relative: 0 %
HCT: 42 % (ref 39.0–52.0)
Hemoglobin: 13.6 g/dL (ref 13.0–17.0)
Immature Granulocytes: 1 %
Lymphocytes Relative: 3 %
Lymphs Abs: 0.4 10*3/uL — ABNORMAL LOW (ref 0.7–4.0)
MCH: 31.9 pg (ref 26.0–34.0)
MCHC: 32.4 g/dL (ref 30.0–36.0)
MCV: 98.6 fL (ref 80.0–100.0)
Monocytes Absolute: 0.9 10*3/uL (ref 0.1–1.0)
Monocytes Relative: 6 %
Neutro Abs: 13.5 10*3/uL — ABNORMAL HIGH (ref 1.7–7.7)
Neutrophils Relative %: 90 %
Platelets: 153 10*3/uL (ref 150–400)
RBC: 4.26 MIL/uL (ref 4.22–5.81)
RDW: 12.6 % (ref 11.5–15.5)
WBC: 15 10*3/uL — ABNORMAL HIGH (ref 4.0–10.5)
nRBC: 0 % (ref 0.0–0.2)

## 2019-02-05 LAB — LACTIC ACID, PLASMA: Lactic Acid, Venous: 1.2 mmol/L (ref 0.5–1.9)

## 2019-02-05 LAB — TSH: TSH: 2.386 u[IU]/mL (ref 0.350–4.500)

## 2019-02-05 LAB — AMMONIA: Ammonia: 27 umol/L (ref 9–35)

## 2019-02-05 MED ORDER — CEPHALEXIN 500 MG PO CAPS: 500.0000 mg | ORAL_CAPSULE | Freq: Four times a day (QID) | ORAL | 0 refills | Status: AC

## 2019-02-05 MED ORDER — CEPHALEXIN 250 MG PO CAPS
500.0000 mg | ORAL_CAPSULE | Freq: Once | ORAL | Status: DC
  Filled 2019-02-05: qty 2

## 2019-02-05 NOTE — ED Triage Notes (Addendum)
States last night started to peeing a lot yesterday , today he has diarrhea and peeing a lot, is on  BP meds, no n/v/sob

## 2019-02-05 NOTE — Discharge Instructions (Signed)
Your work-up today showed evidence of urinary tract infection with nitrites and leukocytes as we discussed.  We sent a urine culture to further evaluate.  Your bladder scan, subsequent ultrasound, and in and out catheterization did not show evidence of obstruction.  We suspect your urinary incontinence is due to the infection.  Please take the antibiotics for the next 10 days to help treat this.  As we discussed, we have low suspicion for a spinal or back cause of your incontinence with lack of pain or injury.  If you develop any new or worsening symptoms, please return to the nearest emergency department.  Please follow-up with your primary doctor.

## 2019-02-05 NOTE — ED Provider Notes (Signed)
[facility] EMERGENCY DEPARTMENT Provider Note   CSN: [id number] Arrival date & time: 02/05/19  6010     History   Chief Complaint Chief Complaint  Patient presents with  . Dysuria    HPI Randy [name] is a 78 y.o. male.     The history is provided by the patient, the spouse and medical records. No language interpreter was used.  Dysuria Presenting symptoms: dysuria   Relieved by:  Nothing Worsened by:  Nothing Ineffective treatments:  None tried Associated symptoms: diarrhea, urinary frequency and urinary incontinence   Associated symptoms: no abdominal pain, no fever, no flank pain, no groin pain, no hematuria, no nausea, no scrotal swelling, no urinary hesitation, no urinary retention and no vomiting   Risk factors: no recent infection     Past Medical History:  Diagnosis Date  . Diabetes mellitus   . Hypertension   . OSA (obstructive sleep apnea) 11/24/2016    Patient Active Problem List   Diagnosis Date Noted  . Sleep related hypoxia 01/09/2017  . Periodic limb movements of sleep 01/09/2017  . OSA (obstructive sleep apnea) 11/24/2016  . Diabetes mellitus without complication ([place]) 93/23/5573  . Upper GI bleed 07/06/2011  . GI bleeding 07/05/2011  . Anemia associated with acute blood loss 07/05/2011  . DM (diabetes mellitus), type 2 ([place]) 07/05/2011  . HTN (hypertension) 07/05/2011  . Hyperlipidemia 07/05/2011    Past Surgical History:  Procedure Laterality Date  . CATARACT EXTRACTION, BILATERAL Bilateral 2019  . COLONOSCOPY  07/06/2011   Procedure: COLONOSCOPY;  Surgeon: Winfield Cunas., MD;  Location: [facility] ENDOSCOPY;  Service: Endoscopy;  Laterality: N/A;  . ESOPHAGOGASTRODUODENOSCOPY  07/05/2011   Procedure: ESOPHAGOGASTRODUODENOSCOPY (EGD);  Surgeon: Landry Dyke, MD;  Location: [facility] ENDOSCOPY;  Service: Endoscopy;  Laterality: N/A;        Home Medications    Prior to Admission medications   Medication Sig Start Date End  Date Taking? Authorizing Provider  Blood Glucose Calibration (TRUE METRIX LEVEL 1) Low SOLN USE TO CHECK METER PRIOR TO TESTING SUGAR 11/09/17   Marletta Lor, MD  Blood Glucose Monitoring Suppl (TRUE METRIX AIR GLUCOSE METER) w/Device KIT 1 each by Does not apply route daily. 11/09/17   Marletta Lor, MD  glucose blood test strip USE TO CHECK BLOOD SUGAR DAILY AND PRN 11/24/17   Marletta Lor, MD  ketoconazole (NIZORAL) 2 % cream Apply 1 fingertip amount to each foot twice daily. 08/10/17   Evelina Bucy, DPM  lisinopril-hydrochlorothiazide (PRINZIDE,ZESTORETIC) 20-12.5 MG tablet Take 1 tablet by mouth daily. 04/10/18   Nafziger, Tommi Rumps, NP  metFORMIN (GLUCOPHAGE) 1000 MG tablet Take 1 tablet (1,000 mg total) by mouth daily with breakfast. 04/10/18   Nafziger, Tommi Rumps, NP  TRUEPLUS LANCETS 33G MISC USE TO CHECK BLOOD SUGAR DAILY AND PRN 11/09/17   Marletta Lor, MD    Family History Family History  Problem Relation Age of Onset  . Brain cancer Mother   . Hypertension Father   . Diabetes Father   . Stroke Father     Social History Social History   Tobacco Use  . Smoking status: Former Smoker    Packs/day: 1.00    Years: 30.00    Pack years: 30.00    Types: Cigarettes    Quit date: 07/03/2001    Years since quitting: 17.6  . Smokeless tobacco: Former Systems developer    Types: Chew    Quit date: 07/04/1991  Substance Use Topics  .  Alcohol use: Yes    Alcohol/week: 3.0 standard drinks    Types: 3 Glasses of wine per week    Comment: daily alcohol wine with dinner  . Drug use: No     Allergies   Patient has no known allergies.   Review of Systems Review of Systems  Constitutional: Negative for chills, diaphoresis, fatigue and fever.  Eyes: Negative for visual disturbance.  Respiratory: Positive for shortness of breath (transient). Negative for cough, chest tightness and wheezing.   Cardiovascular: Negative for chest pain, palpitations and leg swelling.   Gastrointestinal: Positive for abdominal distention and diarrhea. Negative for abdominal pain, constipation, nausea and vomiting.  Genitourinary: Positive for bladder incontinence, dysuria and frequency. Negative for flank pain, hematuria, hesitancy and scrotal swelling.  Musculoskeletal: Negative for back pain, neck pain and neck stiffness.  Skin: Negative for rash and wound.  Neurological: Negative for dizziness, tremors, seizures, speech difficulty, weakness, light-headedness, numbness and headaches.  Psychiatric/Behavioral: Positive for confusion. Negative for agitation.  All other systems reviewed and are negative.    Physical Exam Updated Vital Signs BP (!) 172/72 (BP Location: Right Arm)   Pulse (!) 105   Temp 98.9 F (37.2 C) (Oral)   Resp 16   SpO2 98%   Physical Exam Vitals signs and nursing note reviewed.  Constitutional:      General: He is not in acute distress.    Appearance: He is well-developed. He is not ill-appearing, toxic-appearing or diaphoretic.  HENT:     Head: Normocephalic and atraumatic.     Nose: Nose normal. No congestion or rhinorrhea.     Mouth/Throat:     Mouth: Mucous membranes are moist.     Pharynx: No oropharyngeal exudate.  Eyes:     Conjunctiva/sclera: Conjunctivae normal.     Pupils: Pupils are equal, round, and reactive to light.  Neck:     Musculoskeletal: Neck supple. No muscular tenderness.  Cardiovascular:     Rate and Rhythm: Regular rhythm. Tachycardia present.     Pulses: Normal pulses.     Heart sounds: No murmur.  Pulmonary:     Effort: Pulmonary effort is normal. No respiratory distress.     Breath sounds: Normal breath sounds.  Abdominal:     General: There is no distension.     Palpations: Abdomen is soft.     Tenderness: There is no abdominal tenderness. There is no right CVA tenderness, left CVA tenderness or guarding.  Musculoskeletal:        General: No tenderness.     Right lower leg: No edema.     Left lower  leg: No edema.  Skin:    General: Skin is warm and dry.     Capillary Refill: Capillary refill takes less than 2 seconds.  Neurological:     General: No focal deficit present.     Mental Status: He is alert and oriented to person, place, and time.     Sensory: No sensory deficit.     Motor: No weakness.  Psychiatric:        Mood and Affect: Mood normal.      ED Treatments / Results  Labs (all labs ordered are listed, but only abnormal results are displayed) Labs Reviewed  URINALYSIS, ROUTINE W REFLEX MICROSCOPIC - Abnormal; Notable for the following components:      Result Value   Color, Urine AMBER (*)    APPearance HAZY (*)    Hgb urine dipstick LARGE (*)    Protein, ur  100 (*)    Nitrite POSITIVE (*)    Leukocytes,Ua LARGE (*)    WBC, UA >50 (*)    All other components within normal limits  CBC WITH DIFFERENTIAL/PLATELET - Abnormal; Notable for the following components:   WBC 15.0 (*)    Neutro Abs 13.5 (*)    Lymphs Abs 0.4 (*)    Abs Immature Granulocytes 0.11 (*)    All other components within normal limits  COMPREHENSIVE METABOLIC PANEL - Abnormal; Notable for the following components:   Sodium 132 (*)    Chloride 91 (*)    Glucose, Bld 185 (*)    Total Protein 6.3 (*)    All other components within normal limits  URINE CULTURE  CULTURE, BLOOD (ROUTINE X 2)  CULTURE, BLOOD (ROUTINE X 2)  AMMONIA  TSH  LACTIC ACID, PLASMA  LACTIC ACID, PLASMA    EKG None  Radiology No results found.  Procedures Procedures (including critical care time)  Medications Ordered in ED Medications  cephALEXin (KEFLEX) capsule 500 mg (has no administration in time range)     Initial Impression / Assessment and Plan / ED Course  I have reviewed the triage vital signs and the nursing notes.  Pertinent labs & imaging results that were available during my care of the patient were reviewed by me and considered in my medical decision making (see chart for details).         Randy [name] is a 78 y.o. male with a past medical history significant for diabetes, hypertension, hyperlipidemia, and sleep apnea who presents with urinary incontinence, dysuria, and some diarrhea.  He reports that he has had diarrhea on and off for the last month but that is not his primary complaint.  He reports that since yesterday, he has been urinating on himself approximately 5 minutes.  He feels his abdomen may be slightly distended but is not having significant tenderness.  No nausea, vomiting, fevers, chills, or flank pains.  He reports no injuries and denies any testicle or scrotal symptoms.  He reports that he has never had a bladder outlet obstruction or any type of urinary tract infection in the past.  He reports no blood in his urine and has no history of kidney stones personally.  He denies other complaints on arrival.  On exam, abdomen is nontender but does feel slightly distended.  Lungs are clear and chest is nontender.  CVA areas and back nontender.  Exam otherwise remarkable.  Clinical I am concerned patient's having overflow incontinence causing his urinary symptoms.  We will check urinalysis to look for UTI as the cause.  We will get a bladder scan.  Due to his on and off diarrhea for the last month, we had a sure decision made conversation with family and decided to get some screening lab work to look for electrolyte imbalance or kidney injury from dehydration.  If bladder distention is discovered, will likely place Foley catheter for outpatient follow-up with urology and if infection is found, will give antibiotics.  Anticipate reassessment, patient is in no distress on my evaluation.  8:44 AM I was called back into the room because the patient was having increased tachypnea and more shortness of breath.  Wife reports that patient is acting more confused.  Patient is still having constant urination onto the bed.  He thinks he is having difficulty breathing due to his cloth  mask he is brought from home.  He was given a surgical mask and his breathing  appears to improve.  He still denies shortness of breath to me and thinks he was just worked up from his mask.  We agreed to hold on chest x-ray at this time however due to the reported confusion will add other blood tests including liver function, ammonia, TSH, lactic acid.  If the patient is found to have UTI in the setting of confusion, may have a shared decision making conversation with family about possible admission.  Still awaiting bladder scan.  10:37 AM Urinalysis does confirm UTI and patient does have a leukocytosis of 15.  TSH normal, ammonia not elevated, lactic acid not elevated.  Urine culture was sent.  Metabolic panel shows normal kidney function and liver function.  A bedside ultrasound was attempted however due to body habitus was difficult to see the bladder.  I am still concerned about possible overflow incontinence.  Will have nursing attempt in and out cath to make sure there is not an outlet obstruction.  If there is no urine drained, will likely talk about discharge with outpatient antibiotics for this patient with close follow-up.  If a large amount of urine is discovered, will likely place Foley catheter.  Nursing to the in and out catheterization with minimal urine output.  Now there is no evidence of obstruction.  Suspect UTI is the main source of symptoms.  We discussed possibility that incontinence could be from a spinal injury or back injury but patient has no pain and no reported injury.  Patient will follow-up with PCP and will take antibiotics for the UTI.  His blood work was otherwise reassuring aside from a leukocytosis.  His breathing was much better after changing masks.  He did not have any complaint of shortness of breath.  Patient and family agreed with plan of care including strict return precautions and patient was discharged in good condition.   Final Clinical Impressions(s) / ED  Diagnoses   Final diagnoses:  Lower urinary tract infectious disease  Dysuria  Urinary incontinence, unspecified type    ED Discharge Orders         Ordered    cephALEXin (KEFLEX) 500 MG capsule  4 times daily     02/05/19 1120          Clinical Impression: 1. Lower urinary tract infectious disease   2. Dysuria   3. Urinary incontinence, unspecified type     Disposition: Discharge  Condition: Good  I have discussed the results, Dx and Tx plan with the pt(& family if present). He/she/they expressed understanding and agree(s) with the plan. Discharge instructions discussed at great length. Strict return precautions discussed and pt &/or family have verbalized understanding of the instructions. No further questions at time of discharge.    New Prescriptions   CEPHALEXIN (KEFLEX) 500 MG CAPSULE    Take 1 capsule (500 mg total) by mouth 4 (four) times daily for 10 days.    Follow Up: Dorothyann Peng, NP [place] [address] [phone]     [facility] EMERGENCY DEPARTMENT [address] [id number] [place]       Fender Herder, Gwenyth Allegra, MD 02/05/19 1145

## 2019-02-07 LAB — URINE CULTURE: Culture: >=100000 — AB

## 2019-02-08 ENCOUNTER — Ambulatory Visit: Payer: Self-pay | Admitting: *Deleted

## 2019-02-08 ENCOUNTER — Ambulatory Visit: Payer: Medicare PPO | Admitting: Podiatry

## 2019-02-08 ENCOUNTER — Telehealth (INDEPENDENT_AMBULATORY_CARE_PROVIDER_SITE_OTHER): Payer: Medicare PPO | Admitting: Adult Health

## 2019-02-08 ENCOUNTER — Telehealth: Payer: Self-pay | Admitting: *Deleted

## 2019-02-08 ENCOUNTER — Other Ambulatory Visit: Payer: Self-pay

## 2019-02-08 DIAGNOSIS — N3000 Acute cystitis without hematuria: Secondary | ICD-10-CM | POA: Diagnosis not present

## 2019-02-08 NOTE — Telephone Encounter (Signed)
Pt's wife called stating pt was seen in ED on 8/4 and diagnosed with lower UTI; he was given an antibiotic but has no improvement in his symptoms; he continues to have frequency, incontinent, weakness, and he feels unbalanced; recommendations made per nurse triage protocol; she would like for the pt to be seen in the office; the pt sees [facility], [facility]; pt's wife transferred to [facility] for scheduling.  Reason for Disposition . Patient sounds very sick or weak to the triager  Answer Assessment - Initial Assessment Questions 1. ANTIBIOTIC: "What antibiotic are you taking?" "How many times per day?"     cephALEXin (KEFLEX) 500 MG capsule  4 times daily  2. DURATION: "When was the antibiotic started?"     02/05/2019 3. MAIN SYMPTOM: "What is the main symptom you are concerned about?"     Unable to sleep or eat; frequent urination 4. FEVER: "Do you have a fever?" If so, ask: "What is it, how was it measured, and when did it start?"     no 5. OTHER SYMPTOMS: "Do you have any other symptoms?" (e.g., flank pain, penile discharge, scrotal pain, blood in urine)     Blood in urine but not in the last 24 hours, back pain,weakness, incontinent  Protocols used: URINARY TRACT INFECTION ON ANTIBIOTIC FOLLOW-UP CALL - MALE-A-AH

## 2019-02-08 NOTE — Progress Notes (Signed)
Virtual Visit via Video Note  I connected with Randy [name]  on 02/08/19 at  2:30 PM EDT by a video enabled telemedicine application and verified that I am speaking with the correct person using two identifiers.  Location patient: home Location provider:work or home office Persons participating in the virtual visit: patient, provider  I discussed the limitations of evaluation and management by telemedicine and the availability of in person appointments. The patient expressed understanding and agreed to proceed.   HPI:  78 year old male who is being evaluated today for follow-up after recent ER visit three days ago.  He presented with urinary incontinence, dysuria, and some diarrhea.  He reports that he had some diarrhea on and off for the last month but this was not his primary complaint.  He reported that since day before presentation he had been urinating on himself.  He felt as though his abdomen may be slightly distended but was not having any significant tenderness.  He denied nausea, vomiting, fevers, chills, or flank pains.  On exam his abdomen was nontender but did feel slightly distended, lungs were clear and chest is nontender.  There was no CVA tenderness.  Initially, there was concern for overflow incontinence causing his urinary symptoms.  Analysis confirmed UTI and patient did have leukocytosis of 15.  His TSH was normal, ammonia was not elevated, and lactic acid was not elevated.  Urine culture was sent.  Medic Bolick panel shows normal kidney function liver function.  He was prescribed a 10-day course of Keflex.  His urine culture does show susceptibility to cephalosporins.  Today he reports that he has not improved like he had hoped.  He was told in the emergency room that it would be 1 to 2 days before he started to notice improvement.  Today was the first day that he feels like he is improving somewhat, he continues to have incontinence but the fatigue and dysuria are  improving.  He denies fevers or chills.  ROS: See pertinent positives and negatives per HPI.  Past Medical History:  Diagnosis Date  . Diabetes mellitus   . Hypertension   . OSA (obstructive sleep apnea) 11/24/2016    Past Surgical History:  Procedure Laterality Date  . CATARACT EXTRACTION, BILATERAL Bilateral 2019  . COLONOSCOPY  07/06/2011   Procedure: COLONOSCOPY;  Surgeon: Winfield Cunas., MD;  Location: [facility] ENDOSCOPY;  Service: Endoscopy;  Laterality: N/A;  . ESOPHAGOGASTRODUODENOSCOPY  07/05/2011   Procedure: ESOPHAGOGASTRODUODENOSCOPY (EGD);  Surgeon: Landry Dyke, MD;  Location: [facility] ENDOSCOPY;  Service: Endoscopy;  Laterality: N/A;    Family History  Problem Relation Age of Onset  . Brain cancer Mother   . Hypertension Father   . Diabetes Father   . Stroke Father       Current Outpatient Medications:  .  Blood Glucose Calibration (TRUE METRIX LEVEL 1) Low SOLN, USE TO CHECK METER PRIOR TO TESTING SUGAR, Disp: 3 each, Rfl: 0 .  Blood Glucose Monitoring Suppl (TRUE METRIX AIR GLUCOSE METER) w/Device KIT, 1 each by Does not apply route daily., Disp: 1 kit, Rfl: 0 .  cephALEXin (KEFLEX) 500 MG capsule, Take 1 capsule (500 mg total) by mouth 4 (four) times daily for 10 days., Disp: 40 capsule, Rfl: 0 .  glucose blood test strip, USE TO CHECK BLOOD SUGAR DAILY AND PRN, Disp: 300 each, Rfl: 0 .  ketoconazole (NIZORAL) 2 % cream, Apply 1 fingertip amount to each foot twice daily., Disp: 30 g, Rfl: 0 .  lisinopril-hydrochlorothiazide (PRINZIDE,ZESTORETIC) 20-12.5 MG tablet, Take 1 tablet by mouth daily., Disp: 90 tablet, Rfl: 2 .  metFORMIN (GLUCOPHAGE) 1000 MG tablet, Take 1 tablet (1,000 mg total) by mouth daily with breakfast., Disp: 90 tablet, Rfl: 2 .  TRUEPLUS LANCETS 33G MISC, USE TO CHECK BLOOD SUGAR DAILY AND PRN, Disp: 300 each, Rfl: 0  EXAM:  VITALS per patient if applicable:  GENERAL: alert, oriented, appears well and in no acute distress  HEENT: atraumatic,  conjunttiva clear, no obvious abnormalities on inspection of external nose and ears  NECK: normal movements of the head and neck  LUNGS: on inspection no signs of respiratory distress, breathing rate appears normal, no obvious gross SOB, gasping or wheezing  CV: no obvious cyanosis  MS: moves all visible extremities without noticeable abnormality  PSYCH/NEURO: pleasant and cooperative, no obvious depression or anxiety, speech and thought processing grossly intact  ASSESSMENT AND PLAN:  Discussed the following assessment and plan:  1. Acute cystitis without hematuria -He was informed that he had a pretty significant urinary tract infection and it may take a couple more days for it to start to clear up completely.  He was advised to continue with Keflex as directed.  Follow-up early next week if symptoms have not resolved.     I discussed the assessment and treatment plan with the patient. The patient was provided an opportunity to ask questions and all were answered. The patient agreed with the plan and demonstrated an understanding of the instructions.   The patient was advised to call back or seek an in-person evaluation if the symptoms worsen or if the condition fails to improve as anticipated.   Dorothyann Peng, NP

## 2019-02-08 NOTE — Telephone Encounter (Signed)
See note. Patient scheduled today at 2:30pm.

## 2019-02-08 NOTE — Telephone Encounter (Signed)
Post ED Visit - Positive Culture Follow-up  Culture report reviewed by antimicrobial stewardship pharmacist: [place] Team []  Elenor Quinones, Pharm.D. []  Heide Guile, Pharm.D., BCPS AQ-ID []  Parks Neptune, Pharm.D., BCPS []  Alycia Rossetti, Pharm.D., BCPS []  [place], Pharm.D., BCPS, AAHIVP []  Legrand Como, Pharm.D., BCPS, AAHIVP []  Salome Arnt, PharmD, BCPS []  Johnnette Gourd, PharmD, BCPS []  Hughes Better, PharmD, BCPS []  Leeroy Cha, PharmD []  Laqueta Linden, PharmD, BCPS []  Albertina Parr, PharmD  [place] Team []  Leodis Sias, PharmD []  Lindell Spar, PharmD []  Royetta Asal, PharmD []  Graylin Shiver, Rph []  Rema Fendt) Glennon Mac, PharmD []  Arlyn Dunning, PharmD []  Netta Cedars, PharmD []  Dia Sitter, PharmD []  Leone Haven, PharmD []  Gretta Arab, PharmD []  Theodis Shove, PharmD []  Peggyann Juba, PharmD []  Reuel Boom, PharmD Eddie Candle, PharmD   Positive urine culture Treated with Cephalexin, organism sensitive to the same and no further patient follow-up is required at this time.  Harlon Flor [facility] 02/08/2019, 10:39 AM

## 2019-02-10 ENCOUNTER — Encounter: Payer: Self-pay | Admitting: Adult Health

## 2019-02-10 LAB — CULTURE, BLOOD (ROUTINE X 2)
Culture: NO GROWTH
Culture: NO GROWTH
Special Requests: ADEQUATE
Special Requests: ADEQUATE

## 2019-02-11 ENCOUNTER — Other Ambulatory Visit: Payer: Self-pay

## 2019-02-11 ENCOUNTER — Telehealth: Payer: Self-pay | Admitting: Adult Health

## 2019-02-11 ENCOUNTER — Emergency Department (HOSPITAL_COMMUNITY)
Admission: EM | Admit: 2019-02-11 | Discharge: 2019-02-11 | Discharge status: Against Medical Advice | Payer: Medicare PPO | Attending: Emergency Medicine | Admitting: Emergency Medicine

## 2019-02-11 ENCOUNTER — Encounter (HOSPITAL_COMMUNITY): Payer: Self-pay

## 2019-02-11 ENCOUNTER — Encounter: Payer: Self-pay | Admitting: Family Medicine

## 2019-02-11 ENCOUNTER — Telehealth (INDEPENDENT_AMBULATORY_CARE_PROVIDER_SITE_OTHER): Payer: Medicare PPO | Admitting: Family Medicine

## 2019-02-11 DIAGNOSIS — I10 Essential (primary) hypertension: Secondary | ICD-10-CM | POA: Diagnosis not present

## 2019-02-11 DIAGNOSIS — N3000 Acute cystitis without hematuria: Secondary | ICD-10-CM

## 2019-02-11 DIAGNOSIS — Z5321 Procedure and treatment not carried out due to patient leaving prior to being seen by health care provider: Secondary | ICD-10-CM | POA: Insufficient documentation

## 2019-02-11 DIAGNOSIS — N399 Disorder of urinary system, unspecified: Secondary | ICD-10-CM | POA: Diagnosis present

## 2019-02-11 DIAGNOSIS — R39198 Other difficulties with micturition: Secondary | ICD-10-CM

## 2019-02-11 MED ORDER — TAMSULOSIN HCL 0.4 MG PO CAPS: 0.4000 mg | ORAL_CAPSULE | Freq: Every day | ORAL | 1 refills | Status: DC

## 2019-02-11 NOTE — Progress Notes (Signed)
This visit type was conducted due to national recommendations for restrictions regarding the COVID-19 pandemic in an effort to limit this patient's exposure and mitigate transmission in our community.   Virtual Visit via Video Note  I connected with Anne Fu on 02/11/19 at  4:15 PM EDT by a video enabled telemedicine application and verified that I am speaking with the correct person using two identifiers.  Location patient: home Location provider:work or home office Persons participating in the virtual visit: patient, provider  I discussed the limitations of evaluation and management by telemedicine and the availability of in person appointments. The patient expressed understanding and agreed to proceed.   HPI: Patient for follow-up regarding recent urinary tract infection.  He was seen in the ER on the fourth of this month with dysuria along with some mild diarrhea.  No flank pain.  No fever.  Urine revealed positive nitrites and leukocytes as well as blood.  White blood count was 15,000.  Blood chemistries were relatively stable.  There was some concern for possible overflow incontinence.  A bedside ultrasound was attempted but due to body habitus they had difficulty seeing the bladder.  They did perform in and out cath and apparently there was minimal urine output.  Patient's blood cultures remain negative.  Urine culture did grew out E. coli which was susceptible to Keflex.  He has been faithfully taking Keflex 4 times daily but not feeling any better.  Still has some urine frequency and generalized weakness.  No fever.  No nausea or vomiting.  Blood pressure earlier today 155/80.  Patient does relate that he has had some slow urine stream and dribbling with urination and nocturia 3-4 times at night even prior to this infection.  He denies ever being on any medications for BPH  He has type 2 diabetes which is fairly well controlled.  Also has hypertension.   ROS: See pertinent positives  and negatives per HPI.  Past Medical History:  Diagnosis Date  . Diabetes mellitus   . Hypertension   . OSA (obstructive sleep apnea) 11/24/2016    Past Surgical History:  Procedure Laterality Date  . CATARACT EXTRACTION, BILATERAL Bilateral 2019  . COLONOSCOPY  07/06/2011   Procedure: COLONOSCOPY;  Surgeon: Winfield Cunas., MD;  Location: [facility] ENDOSCOPY;  Service: Endoscopy;  Laterality: N/A;  . ESOPHAGOGASTRODUODENOSCOPY  07/05/2011   Procedure: ESOPHAGOGASTRODUODENOSCOPY (EGD);  Surgeon: Landry Dyke, MD;  Location: [facility] ENDOSCOPY;  Service: Endoscopy;  Laterality: N/A;    Family History  Problem Relation Age of Onset  . Brain cancer Mother   . Hypertension Father   . Diabetes Father   . Stroke Father     SOCIAL HX: Quit smoking 2002.  Lives with wife.   Current Outpatient Medications:  .  Blood Glucose Calibration (TRUE METRIX LEVEL 1) Low SOLN, USE TO CHECK METER PRIOR TO TESTING SUGAR, Disp: 3 each, Rfl: 0 .  Blood Glucose Monitoring Suppl (TRUE METRIX AIR GLUCOSE METER) w/Device KIT, 1 each by Does not apply route daily., Disp: 1 kit, Rfl: 0 .  cephALEXin (KEFLEX) 500 MG capsule, Take 1 capsule (500 mg total) by mouth 4 (four) times daily for 10 days., Disp: 40 capsule, Rfl: 0 .  glucose blood test strip, USE TO CHECK BLOOD SUGAR DAILY AND PRN, Disp: 300 each, Rfl: 0 .  ketoconazole (NIZORAL) 2 % cream, Apply 1 fingertip amount to each foot twice daily., Disp: 30 g, Rfl: 0 .  lisinopril-hydrochlorothiazide (PRINZIDE,ZESTORETIC) 20-12.5 MG tablet, Take  1 tablet by mouth daily., Disp: 90 tablet, Rfl: 2 .  metFORMIN (GLUCOPHAGE) 1000 MG tablet, Take 1 tablet (1,000 mg total) by mouth daily with breakfast., Disp: 90 tablet, Rfl: 2 .  tamsulosin (FLOMAX) 0.4 MG CAPS capsule, Take 1 capsule (0.4 mg total) by mouth daily., Disp: 30 capsule, Rfl: 1 .  TRUEPLUS LANCETS 33G MISC, USE TO CHECK BLOOD SUGAR DAILY AND PRN, Disp: 300 each, Rfl: 0  EXAM:  VITALS per patient if  applicable:  GENERAL: alert, oriented, appears well and in no acute distress  HEENT: atraumatic, conjunttiva clear, no obvious abnormalities on inspection of external nose and ears  NECK: normal movements of the head and neck  LUNGS: on inspection no signs of respiratory distress, breathing rate appears normal, no obvious gross SOB, gasping or wheezing  CV: no obvious cyanosis  MS: moves all visible extremities without noticeable abnormality  PSYCH/NEURO: pleasant and cooperative, no obvious depression or anxiety, speech and thought processing grossly intact  ASSESSMENT AND PLAN:  Discussed the following assessment and plan:  Recent acute cystitis with urine culture growing out E. coli.  Patient not symptomatically improved with Keflex.  Should be adequately covered with the Keflex.  Question is whether he has some outflow obstruction even though his cath in the ER did not reveal any significant retained urine.  He is describing slow stream for many months  -Continue Keflex 500 mg 4 times daily -Patient will come in tomorrow morning for repeat labs with CBC and repeat urinalysis -We discussed trial of tamsulosin 0.4 mg nightly -Recommend follow-up with primary within a few weeks to reassess.  Follow-up sooner for any fever, confusion, or other concerns    I discussed the assessment and treatment plan with the patient. The patient was provided an opportunity to ask questions and all were answered. The patient agreed with the plan and demonstrated an understanding of the instructions.   The patient was advised to call back or seek an in-person evaluation if the symptoms worsen or if the condition fails to improve as anticipated.   Carolann Littler, MD

## 2019-02-11 NOTE — ED Triage Notes (Signed)
Pt states that he was here this week and diagnosed with UTI and still on antibiotics, not helping, pt also having HTN

## 2019-02-11 NOTE — ED Notes (Addendum)
Pt states that he is leaving and will go see his doctor in the morning, attempted to talk pt into staying, pt refused

## 2019-02-11 NOTE — Telephone Encounter (Signed)
Copied from [place] [id number]. Topic: General - Other >> Feb 11, 2019  2:58 PM Burchel, Abbi R wrote: Reason for CRM: Pt states he needs to speak to [employer] or his assistant re: worsening UTI sx.  Please call pt: [phone]

## 2019-02-11 NOTE — Telephone Encounter (Signed)
Clinic RN spoke with patient. Patient was seen in ED last week and followed up with [facility] but is still having problems urinating frequently. Virtual visit scheduled for 4:15 PM with Dr. Elease Hashimoto

## 2019-02-12 ENCOUNTER — Ambulatory Visit (INDEPENDENT_AMBULATORY_CARE_PROVIDER_SITE_OTHER): Payer: Medicare PPO | Admitting: Family Medicine

## 2019-02-12 ENCOUNTER — Other Ambulatory Visit: Payer: Medicare PPO

## 2019-02-12 ENCOUNTER — Other Ambulatory Visit: Payer: Self-pay

## 2019-02-12 ENCOUNTER — Encounter: Payer: Self-pay | Admitting: Family Medicine

## 2019-02-12 ENCOUNTER — Ambulatory Visit: Payer: Self-pay | Admitting: *Deleted

## 2019-02-12 VITALS — BP 140/62 | HR 96 | Temp 98.2°F | Ht 68.0 in | Wt 169.0 lb

## 2019-02-12 DIAGNOSIS — N3000 Acute cystitis without hematuria: Secondary | ICD-10-CM

## 2019-02-12 DIAGNOSIS — R531 Weakness: Secondary | ICD-10-CM | POA: Diagnosis not present

## 2019-02-12 DIAGNOSIS — I10 Essential (primary) hypertension: Secondary | ICD-10-CM | POA: Diagnosis not present

## 2019-02-12 LAB — CBC WITH DIFFERENTIAL/PLATELET
Basophils Absolute: 0 10*3/uL (ref 0.0–0.1)
Basophils Relative: 0.3 % (ref 0.0–3.0)
Eosinophils Absolute: 0.1 10*3/uL (ref 0.0–0.7)
Eosinophils Relative: 1.6 % (ref 0.0–5.0)
HCT: 39.7 % (ref 39.0–52.0)
Hemoglobin: 13.3 g/dL (ref 13.0–17.0)
Lymphocytes Relative: 10.3 % — ABNORMAL LOW (ref 12.0–46.0)
Lymphs Abs: 0.9 10*3/uL (ref 0.7–4.0)
MCHC: 33.5 g/dL (ref 30.0–36.0)
MCV: 96.9 fl (ref 78.0–100.0)
Monocytes Absolute: 1.2 10*3/uL — ABNORMAL HIGH (ref 0.1–1.0)
Monocytes Relative: 13.4 % — ABNORMAL HIGH (ref 3.0–12.0)
Neutro Abs: 6.6 10*3/uL (ref 1.4–7.7)
Neutrophils Relative %: 74.4 % (ref 43.0–77.0)
Platelets: 372 10*3/uL (ref 150.0–400.0)
RBC: 4.09 Mil/uL — ABNORMAL LOW (ref 4.22–5.81)
RDW: 13.9 % (ref 11.5–15.5)
WBC: 8.8 10*3/uL (ref 4.0–10.5)

## 2019-02-12 LAB — BASIC METABOLIC PANEL
BUN: 18 mg/dL (ref 6–23)
CO2: 39 mEq/L — ABNORMAL HIGH (ref 19–32)
Calcium: 11.1 mg/dL — ABNORMAL HIGH (ref 8.4–10.5)
Chloride: 79 mEq/L — ABNORMAL LOW (ref 96–112)
Creatinine, Ser: 1.04 mg/dL (ref 0.40–1.50)
GFR: 68.99 mL/min (ref >60.00)
Glucose, Bld: 200 mg/dL — ABNORMAL HIGH (ref 70–99)
Potassium: 4.7 mEq/L (ref 3.5–5.1)
Sodium: 127 mEq/L — ABNORMAL LOW (ref 135–145)

## 2019-02-12 LAB — POCT URINALYSIS DIPSTICK
Bilirubin, UA: NEGATIVE
Blood, UA: NEGATIVE
Glucose, UA: NEGATIVE
Leukocytes, UA: NEGATIVE
Nitrite, UA: NEGATIVE
Odor: NEGATIVE
Protein, UA: POSITIVE — AB
Spec Grav, UA: 1.015 (ref 1.010–1.025)
Urobilinogen, UA: 0.2 E.U./dL
pH, UA: 6 (ref 5.0–8.0)

## 2019-02-12 NOTE — Telephone Encounter (Signed)
See note. Patient coming in at 10:30

## 2019-02-12 NOTE — Telephone Encounter (Signed)
Patient has had BP elevation last night and this morning. Patient's wife is concerned about this. Patient has been having the lusted symptoms all week - he has UTI that has not improved and has labs scheduled today. Call to office to see if patient can have appointment scheduled.  Reason for Disposition . [3] Systolic BP  >= 295 OR Diastolic >= 188  AND [4] having NO cardiac or neurologic symptoms  Answer Assessment - Initial Assessment Questions 1. BLOOD PRESSURE: "What is the blood pressure?" "Did you take at least two measurements 5 minutes apart?"     200/97  ,  183/91 P 95 2. ONSET: "When did you take your blood pressure?"     This morning 8:00 3. HOW: "How did you obtain the blood pressure?" (e.g., visiting nurse, automatic home BP monitor)     Automatic BP cuff 4. HISTORY: "Do you have a history of high blood pressure?"     yes 5. MEDICATIONS: "Are you taking any medications for blood pressure?" "Have you missed any doses recently?"     Yes- no missed medication 6. OTHER SYMPTOMS: "Do you have any symptoms?" (e.g., headache, chest pain, blurred vision, difficulty breathing, weakness)     Disoriented and very febile- blurred speech 7. PREGNANCY: "Is there any chance you are pregnant?" "When was your last menstrual period?"     n/a  Protocols used: HIGH BLOOD PRESSURE-A-AH

## 2019-02-12 NOTE — Telephone Encounter (Signed)
Pt seen by Dr. Elease Hashimoto.  Nothing further needed at this time.

## 2019-02-12 NOTE — Patient Instructions (Signed)
Stay weill hydrated  Change positions slowly.  We will call with the labs done today.

## 2019-02-12 NOTE — Progress Notes (Signed)
Subjective:     Patient ID: Randy [name], male   DOB: 06-01-1941, 78 y.o.   MRN: [id number]  HPI Patient had recent UTI which was diagnosed during ER visit on 4 August.  Refer to note from yesterday from virtual visit for details:  Patient for follow-up regarding recent urinary tract infection.  He was seen in the ER on the fourth of this month with dysuria along with some mild diarrhea.  No flank pain.  No fever.  Urine revealed positive nitrites and leukocytes as well as blood.  White blood count was 15,000.  Blood chemistries were relatively stable.  There was some concern for possible overflow incontinence.  A bedside ultrasound was attempted but due to body habitus they had difficulty seeing the bladder.  They did perform in and out cath and apparently there was minimal urine output.  Patient's blood cultures remain negative.  Urine culture did grew out E. coli which was susceptible to Keflex.  He has been faithfully taking Keflex 4 times daily but not feeling any better.  Still has some urine frequency and generalized weakness.  No fever.  No nausea or vomiting.  Blood pressure earlier today 155/80.  Patient does relate that he has had some slow urine stream and dribbling with urination and nocturia 3-4 times at night even prior to this infection.  He denies ever being on any medications for BPH  He has type 2 diabetes which is fairly well controlled.  Also has hypertension.  He started tamsulosin yesterday 0.4 mg.  He apparently had blood pressure at home later that evening of 200/100 and went back to the ER and was seen by triage but never was actually seen by provider.  He continues to complain of generalized weakness but does state he has had slightly improved urine flow.  No burning with urination.  No fevers or chills.  No nausea or vomiting.  No chest pain.  He does take lisinopril HCTZ for hypertension at baseline.  Past Medical History:  Diagnosis Date  . Diabetes mellitus    . Hypertension   . OSA (obstructive sleep apnea) 11/24/2016   Past Surgical History:  Procedure Laterality Date  . CATARACT EXTRACTION, BILATERAL Bilateral 2019  . COLONOSCOPY  07/06/2011   Procedure: COLONOSCOPY;  Surgeon: Winfield Cunas., MD;  Location: [facility] ENDOSCOPY;  Service: Endoscopy;  Laterality: N/A;  . ESOPHAGOGASTRODUODENOSCOPY  07/05/2011   Procedure: ESOPHAGOGASTRODUODENOSCOPY (EGD);  Surgeon: Landry Dyke, MD;  Location: [facility] ENDOSCOPY;  Service: Endoscopy;  Laterality: N/A;    reports that he quit smoking about 17 years ago. His smoking use included cigarettes. He has a 30.00 pack-year smoking history. He quit smokeless tobacco use about 27 years ago.  His smokeless tobacco use included chew. He reports current alcohol use of about 3.0 standard drinks of alcohol per week. He reports that he does not use drugs. family history includes Brain cancer in his mother; Diabetes in his father; Hypertension in his father; Stroke in his father. No Known Allergies   Review of Systems  Constitutional: Positive for fatigue. Negative for chills and fever.  Respiratory: Negative for shortness of breath.   Cardiovascular: Negative for chest pain.  Gastrointestinal: Negative for abdominal pain.  Genitourinary: Positive for decreased urine volume, difficulty urinating and frequency. Negative for flank pain and hematuria.  Neurological: Positive for weakness.       Objective:   Physical Exam Constitutional:      Appearance: Normal appearance.  Cardiovascular:  Rate and Rhythm: Normal rate and regular rhythm.  Pulmonary:     Effort: Pulmonary effort is normal.     Breath sounds: Normal breath sounds.  Abdominal:     Palpations: Abdomen is soft. There is no mass.     Tenderness: There is no abdominal tenderness.  Genitourinary:    Comments: Prostate is only mildly enlarged but non-boggy and nontender to palpation. Musculoskeletal:     Right lower leg: No edema.     Left lower  leg: No edema.  Neurological:     Mental Status: He is alert.        Assessment:     #1 recent UTI with E. coli.  Patient on Keflex  #2 hypertension.  Very high blood pressure last evening but much improved today.  Not clear if he had distended bladder when this was checked last night that could have exacerbated.  Today left arm seated in office 140/62 and standing 122/60  #3 question of some component of urinary retention and BPH possibly exacerbating #1    Plan:     -Recheck CBC and basic metabolic panel today -Rechecking urinalysis today -Finish out Keflex -Continue tamsulosin 0.4 mg nightly -Consider urology referral if urinary symptoms not improving over the next couple weeks on tamsulosin  Eulas Post MD [facility]

## 2019-02-12 NOTE — Telephone Encounter (Signed)
FYI

## 2019-02-15 ENCOUNTER — Other Ambulatory Visit: Payer: Self-pay | Admitting: *Deleted

## 2019-02-15 ENCOUNTER — Encounter: Payer: Self-pay | Admitting: Adult Health

## 2019-02-15 DIAGNOSIS — N39490 Overflow incontinence: Secondary | ICD-10-CM

## 2019-02-18 ENCOUNTER — Encounter: Payer: Self-pay | Admitting: Family Medicine

## 2019-02-18 ENCOUNTER — Telehealth: Payer: Self-pay | Admitting: Adult Health

## 2019-02-18 NOTE — Telephone Encounter (Signed)
Pt would like to know if provider would send in a Rx for his UTI. Pt says that he is aware that they are working on Urology referral however pt says meanwhile he is in pain.   Pharmacy: [facility], [place]   Pt would like a call back to confirm.  [place]

## 2019-02-19 NOTE — Telephone Encounter (Signed)
I am wondering if the abx he was on after his hospital admission was not adequately covering him.   Lets place on Cipro 500 mg BID x 5 days. Have him take a probiotic with this medication

## 2019-02-19 NOTE — Telephone Encounter (Signed)
Pt seen [facility] today.  Will see if he was treated for problem.  Left a message for him to call back.

## 2019-02-20 NOTE — Telephone Encounter (Signed)
Pt returning call to nurse

## 2019-02-20 NOTE — Telephone Encounter (Signed)
Spoke to Mrs. Raschke and she shared that the pt was seen by urology yesterday and was given a 30 day supply of antibiotic and a muscle relaxer.  He has started medication.  Advised to follow up with [facility] as needed.  Nothing further needed.

## 2019-03-07 ENCOUNTER — Other Ambulatory Visit: Payer: Self-pay | Admitting: Adult Health

## 2019-03-08 NOTE — Telephone Encounter (Signed)
Sent to the pharmacy by e-scribe. 

## 2019-04-14 DIAGNOSIS — G4733 Obstructive sleep apnea (adult) (pediatric): Secondary | ICD-10-CM | POA: Diagnosis not present

## 2019-04-19 DIAGNOSIS — R350 Frequency of micturition: Secondary | ICD-10-CM | POA: Diagnosis not present

## 2019-04-19 DIAGNOSIS — N281 Cyst of kidney, acquired: Secondary | ICD-10-CM | POA: Diagnosis not present

## 2019-04-19 DIAGNOSIS — R351 Nocturia: Secondary | ICD-10-CM | POA: Diagnosis not present

## 2019-04-23 DIAGNOSIS — E119 Type 2 diabetes mellitus without complications: Secondary | ICD-10-CM | POA: Diagnosis not present

## 2019-04-30 ENCOUNTER — Encounter: Payer: Self-pay | Admitting: Adult Health

## 2019-04-30 ENCOUNTER — Other Ambulatory Visit: Payer: Self-pay

## 2019-04-30 ENCOUNTER — Ambulatory Visit (INDEPENDENT_AMBULATORY_CARE_PROVIDER_SITE_OTHER): Payer: Medicare PPO

## 2019-04-30 DIAGNOSIS — Z23 Encounter for immunization: Secondary | ICD-10-CM | POA: Diagnosis not present

## 2019-05-15 DIAGNOSIS — G4733 Obstructive sleep apnea (adult) (pediatric): Secondary | ICD-10-CM | POA: Diagnosis not present

## 2019-05-17 ENCOUNTER — Encounter: Payer: Self-pay | Admitting: Adult Health

## 2019-05-28 DIAGNOSIS — R351 Nocturia: Secondary | ICD-10-CM | POA: Diagnosis not present

## 2019-05-28 DIAGNOSIS — R350 Frequency of micturition: Secondary | ICD-10-CM | POA: Diagnosis not present

## 2019-06-06 ENCOUNTER — Other Ambulatory Visit: Payer: Self-pay | Admitting: Family Medicine

## 2019-06-06 MED ORDER — TRUE METRIX BLOOD GLUCOSE TEST VI STRP: ORAL_STRIP | 3 refills | Status: AC

## 2019-06-06 NOTE — Telephone Encounter (Signed)
Sent to the pharmacy by e-scribe. 

## 2019-06-09 ENCOUNTER — Other Ambulatory Visit: Payer: Self-pay | Admitting: Adult Health

## 2019-06-11 NOTE — Telephone Encounter (Signed)
That is fine.   Ok to refill for 90 days

## 2019-06-11 NOTE — Telephone Encounter (Signed)
Spoke to the pt.  Seems to be under some financial strain.  Would like to know if he can just come in for lab work and not do an office visit/follow up.  Please advise.

## 2019-06-12 ENCOUNTER — Other Ambulatory Visit: Payer: Self-pay

## 2019-06-12 ENCOUNTER — Encounter: Payer: Self-pay | Admitting: Podiatry

## 2019-06-12 ENCOUNTER — Other Ambulatory Visit: Payer: Self-pay | Admitting: Family Medicine

## 2019-06-12 ENCOUNTER — Ambulatory Visit: Payer: Medicare PPO | Admitting: Podiatry

## 2019-06-12 DIAGNOSIS — M205X1 Other deformities of toe(s) (acquired), right foot: Secondary | ICD-10-CM | POA: Diagnosis not present

## 2019-06-12 DIAGNOSIS — M205X2 Other deformities of toe(s) (acquired), left foot: Secondary | ICD-10-CM

## 2019-06-12 DIAGNOSIS — E1151 Type 2 diabetes mellitus with diabetic peripheral angiopathy without gangrene: Secondary | ICD-10-CM | POA: Diagnosis not present

## 2019-06-12 DIAGNOSIS — B351 Tinea unguium: Secondary | ICD-10-CM | POA: Diagnosis not present

## 2019-06-12 DIAGNOSIS — E119 Type 2 diabetes mellitus without complications: Secondary | ICD-10-CM

## 2019-06-12 NOTE — Telephone Encounter (Signed)
Left a message for a return call.

## 2019-06-12 NOTE — Progress Notes (Addendum)
This patient presents the office for an evaluation of his diabetic feet.  Patient states is not having any pain or discomfort and he personally trims his nails.  He says he presents the office today to acquire a new pair of diabetic shoes.  Patient has received a pair of diabetic shoes in 2019 from this office.  He presents the office today for a new pair of diabetic shoes.  General Appearance  Alert, conversant and in no acute stress.  Vascular  Dorsalis pedis and posterior tibial  pulses are weakly  palpable  bilaterally.  Capillary return is within normal limits  bilaterally. Temperature is within normal limits  bilaterally.  Neurologic  Senn-Weinstein monofilament wire test diminished   bilaterally. Muscle power within normal limits bilaterally.  Nails Thick disfigured discolored nails with subungual debris  from hallux to fifth toes bilaterally. No evidence of bacterial infection or drainage bilaterally.  Orthopedic  No limitations of motion  feet .  No crepitus or effusions noted.  No bony pathology or digital deformities noted.  Hallux limitus  B/L.  Midfoot DJD  B/L.  Skin  normotropic skin with no porokeratosis noted bilaterally.  No signs of infections or ulcers noted.    Diabetes with vascular and neurologic pathology  ROV.  Diabetic foot exam was performed revealing adequate vascular and neurologic status.  Patient does qualify for diabetic shoes due to DPN and hallux limitus bilaterally.  Patient to make an appointment with pedorthist  in January 2021.   Gardiner Barefoot DPM

## 2019-06-13 ENCOUNTER — Other Ambulatory Visit: Payer: Self-pay | Admitting: Adult Health

## 2019-06-13 ENCOUNTER — Other Ambulatory Visit (INDEPENDENT_AMBULATORY_CARE_PROVIDER_SITE_OTHER): Payer: Medicare PPO

## 2019-06-13 DIAGNOSIS — E119 Type 2 diabetes mellitus without complications: Secondary | ICD-10-CM

## 2019-06-13 LAB — HEMOGLOBIN A1C: Hgb A1c MFr Bld: 6.5 % (ref 4.6–6.5)

## 2019-06-13 NOTE — Telephone Encounter (Signed)
Sent to the pharmacy by e-scribe. 

## 2019-06-14 DIAGNOSIS — G4733 Obstructive sleep apnea (adult) (pediatric): Secondary | ICD-10-CM | POA: Diagnosis not present

## 2019-06-20 ENCOUNTER — Encounter: Payer: Self-pay | Admitting: Adult Health

## 2019-07-11 ENCOUNTER — Encounter: Payer: Self-pay | Admitting: Adult Health

## 2019-07-11 ENCOUNTER — Other Ambulatory Visit: Payer: Self-pay

## 2019-07-11 ENCOUNTER — Ambulatory Visit: Payer: Medicare PPO | Admitting: Orthotics

## 2019-07-11 DIAGNOSIS — M205X1 Other deformities of toe(s) (acquired), right foot: Secondary | ICD-10-CM

## 2019-07-11 DIAGNOSIS — E119 Type 2 diabetes mellitus without complications: Secondary | ICD-10-CM

## 2019-07-11 DIAGNOSIS — E1151 Type 2 diabetes mellitus with diabetic peripheral angiopathy without gangrene: Secondary | ICD-10-CM

## 2019-07-11 DIAGNOSIS — M2041 Other hammer toe(s) (acquired), right foot: Secondary | ICD-10-CM

## 2019-07-11 NOTE — Progress Notes (Signed)
Patient is being seen by NP; will come back when he has an appointment with Carolann Littler, MD

## 2019-07-12 NOTE — Telephone Encounter (Signed)
Please advise 

## 2019-07-15 DIAGNOSIS — G4733 Obstructive sleep apnea (adult) (pediatric): Secondary | ICD-10-CM | POA: Diagnosis not present

## 2019-07-17 ENCOUNTER — Encounter: Payer: Self-pay | Admitting: Family Medicine

## 2019-07-18 NOTE — Telephone Encounter (Signed)
Called pt 2x looks like he has seen someone about foot care.

## 2019-07-25 ENCOUNTER — Ambulatory Visit: Payer: Medicare PPO | Attending: Internal Medicine

## 2019-07-25 DIAGNOSIS — Z23 Encounter for immunization: Secondary | ICD-10-CM | POA: Insufficient documentation

## 2019-07-25 DIAGNOSIS — R300 Dysuria: Secondary | ICD-10-CM | POA: Diagnosis not present

## 2019-07-25 NOTE — Progress Notes (Signed)
   Covid-19 Vaccination Clinic  Name:  Randy [name]    MRN: [id number] DOB: 01-Jul-1941  07/25/2019  [name] was observed post Covid-19 immunization for 15 minutes without incidence. He was provided with Vaccine Information Sheet and instruction to access the V-Safe system.   [name] was instructed to call 911 with any severe reactions post vaccine: Marland Kitchen Difficulty breathing  . Swelling of your face and throat  . A fast heartbeat  . A bad rash all over your body  . Dizziness and weakness    Immunizations Administered    Name Date Dose VIS Date Route   Pfizer COVID-19 Vaccine 07/25/2019  8:43 AM 0.3 mL 06/14/2019 Intramuscular   Manufacturer: [place]   Lot: [id number]   [place]: [id number]

## 2019-07-31 ENCOUNTER — Encounter: Payer: Self-pay | Admitting: Family Medicine

## 2019-07-31 ENCOUNTER — Ambulatory Visit (INDEPENDENT_AMBULATORY_CARE_PROVIDER_SITE_OTHER): Payer: Medicare PPO | Admitting: Family Medicine

## 2019-07-31 ENCOUNTER — Other Ambulatory Visit: Payer: Self-pay

## 2019-07-31 ENCOUNTER — Ambulatory Visit: Payer: Medicare PPO | Admitting: Orthotics

## 2019-07-31 VITALS — BP 132/72 | HR 67 | Temp 97.0°F | Ht 68.0 in | Wt 178.9 lb

## 2019-07-31 DIAGNOSIS — M205X1 Other deformities of toe(s) (acquired), right foot: Secondary | ICD-10-CM

## 2019-07-31 DIAGNOSIS — M2041 Other hammer toe(s) (acquired), right foot: Secondary | ICD-10-CM

## 2019-07-31 DIAGNOSIS — E1151 Type 2 diabetes mellitus with diabetic peripheral angiopathy without gangrene: Secondary | ICD-10-CM

## 2019-07-31 DIAGNOSIS — E1100 Type 2 diabetes mellitus with hyperosmolarity without nonketotic hyperglycemic-hyperosmolar coma (NKHHC): Secondary | ICD-10-CM | POA: Diagnosis not present

## 2019-07-31 NOTE — Progress Notes (Signed)
Patient cast today for DBS; was scanned with OHI scanner; patient chose [id number] sneaker.  Measured 7.5 M on [place].

## 2019-07-31 NOTE — Progress Notes (Signed)
  Subjective:     Patient ID: Randy [name], male   DOB: 06-27-1941, 79 y.o.   MRN: [id number]  HPI   Randy [name] is here requesting prescription for diabetic shoes.  He has had diabetes for he estimates about 15 years.  Currently treated with Metformin.  He is followed by our nurse practitioner under comprehensive care plan for his diabetes.  His diabetes been well controlled with recent A1c 6.5%.  Recent fasting blood sugars have consistently been less than 120.  No symptoms of polyuria or polydipsia.  He sees podiatrist regularly and also ophthalmologist. He has been wearing diabetic shoes for the past couple years.  Other chronic problems include history of hypertension and hyperlipidemia.  Past Medical History:  Diagnosis Date  . Diabetes mellitus   . Hypertension   . OSA (obstructive sleep apnea) 11/24/2016   Past Surgical History:  Procedure Laterality Date  . CATARACT EXTRACTION, BILATERAL Bilateral 2019  . COLONOSCOPY  07/06/2011   Procedure: COLONOSCOPY;  Surgeon: Winfield Cunas., MD;  Location: [facility] ENDOSCOPY;  Service: Endoscopy;  Laterality: N/A;  . ESOPHAGOGASTRODUODENOSCOPY  07/05/2011   Procedure: ESOPHAGOGASTRODUODENOSCOPY (EGD);  Surgeon: Landry Dyke, MD;  Location: [facility] ENDOSCOPY;  Service: Endoscopy;  Laterality: N/A;    reports that he quit smoking about 18 years ago. His smoking use included cigarettes. He has a 30.00 pack-year smoking history. He quit smokeless tobacco use about 28 years ago.  His smokeless tobacco use included chew. He reports current alcohol use of about 3.0 standard drinks of alcohol per week. He reports that he does not use drugs. family history includes Brain cancer in his mother; Diabetes in his father; Hypertension in his father; Stroke in his father. No Known Allergies   Review of Systems  Constitutional: Negative for appetite change and unexpected weight change.  Respiratory: Negative for cough and shortness of breath.   Cardiovascular:  Negative for chest pain.  Endocrine: Negative for polydipsia and polyuria.  Neurological: Negative for weakness.       Objective:   Physical Exam Vitals reviewed.  Constitutional:      Appearance: Normal appearance.  Cardiovascular:     Rate and Rhythm: Normal rate and regular rhythm.  Pulmonary:     Effort: Pulmonary effort is normal.     Breath sounds: Normal breath sounds.  Skin:    Comments: Feet are warm to touch.  He has good dorsalis pedis and posterior tibial pulses.  He has some mild dryness of the feet and small superficial callused area right lateral foot.  No ulcerations.  Good capillary refill.  Normal sensory function with monofilament testing.  Neurological:     Mental Status: He is alert.        Assessment:     Type 2 diabetes well controlled with recent A1c 6.5%.  He does have reported history of some neuropathy symptoms and does have small callus right lateral foot.  We feel he would benefit from ongoing use of diabetic shoes    Plan:     -Continue close follow-up with primary regarding his diabetes -We will complete requested paperwork for diabetic shoes  Eulas Post MD [facility]

## 2019-07-31 NOTE — Patient Instructions (Signed)
Diabetes Mellitus and Foot Care Foot care is an important part of your health, especially when you have diabetes. Diabetes may cause you to have problems because of poor blood flow (circulation) to your feet and legs, which can cause your skin to:  Become thinner and drier.  Break more easily.  Heal more slowly.  Peel and crack. You may also have nerve damage (neuropathy) in your legs and feet, causing decreased feeling in them. This means that you may not notice minor injuries to your feet that could lead to more serious problems. Noticing and addressing any potential problems early is the best way to prevent future foot problems. How to care for your feet Foot hygiene  Wash your feet daily with warm water and mild soap. Do not use hot water. Then, pat your feet and the areas between your toes until they are completely dry. Do not soak your feet as this can dry your skin.  Trim your toenails straight across. Do not dig under them or around the cuticle. File the edges of your nails with an emery board or nail file.  Apply a moisturizing lotion or petroleum jelly to the skin on your feet and to dry, brittle toenails. Use lotion that does not contain alcohol and is unscented. Do not apply lotion between your toes. Shoes and socks  Wear clean socks or stockings every day. Make sure they are not too tight. Do not wear knee-high stockings since they may decrease blood flow to your legs.  Wear shoes that fit properly and have enough cushioning. Always look in your shoes before you put them on to be sure there are no objects inside.  To break in new shoes, wear them for just a few hours a day. This prevents injuries on your feet. Wounds, scrapes, corns, and calluses  Check your feet daily for blisters, cuts, bruises, sores, and redness. If you cannot see the bottom of your feet, use a mirror or ask someone for help.  Do not cut corns or calluses or try to remove them with medicine.  If you  find a minor scrape, cut, or break in the skin on your feet, keep it and the skin around it clean and dry. You may clean these areas with mild soap and water. Do not clean the area with peroxide, alcohol, or iodine.  If you have a wound, scrape, corn, or callus on your foot, look at it several times a day to make sure it is healing and not infected. Check for: ? Redness, swelling, or pain. ? Fluid or blood. ? Warmth. ? Pus or a bad smell. General instructions  Do not cross your legs. This may decrease blood flow to your feet.  Do not use heating pads or hot water bottles on your feet. They may burn your skin. If you have lost feeling in your feet or legs, you may not know this is happening until it is too late.  Protect your feet from hot and cold by wearing shoes, such as at the beach or on hot pavement.  Schedule a complete foot exam at least once a year (annually) or more often if you have foot problems. If you have foot problems, report any cuts, sores, or bruises to your health care provider immediately. Contact a health care provider if:  You have a medical condition that increases your risk of infection and you have any cuts, sores, or bruises on your feet.  You have an injury that is not   healing.  You have redness on your legs or feet.  You feel burning or tingling in your legs or feet.  You have pain or cramps in your legs and feet.  Your legs or feet are numb.  Your feet always feel cold.  You have pain around a toenail. Get help right away if:  You have a wound, scrape, corn, or callus on your foot and: ? You have pain, swelling, or redness that gets worse. ? You have fluid or blood coming from the wound, scrape, corn, or callus. ? Your wound, scrape, corn, or callus feels warm to the touch. ? You have pus or a bad smell coming from the wound, scrape, corn, or callus. ? You have a fever. ? You have a red line going up your leg. Summary  Check your feet every day  for cuts, sores, red spots, swelling, and blisters.  Moisturize feet and legs daily.  Wear shoes that fit properly and have enough cushioning.  If you have foot problems, report any cuts, sores, or bruises to your health care provider immediately.  Schedule a complete foot exam at least once a year (annually) or more often if you have foot problems. This information is not intended to replace advice given to you by your health care provider. Make sure you discuss any questions you have with your health care provider. Document Revised: 03/13/2019 Document Reviewed: 07/22/2016 Elsevier Patient Education  2020 Elsevier Inc.  

## 2019-08-15 ENCOUNTER — Ambulatory Visit: Payer: Medicare PPO | Attending: Internal Medicine

## 2019-08-15 DIAGNOSIS — Z23 Encounter for immunization: Secondary | ICD-10-CM | POA: Insufficient documentation

## 2019-08-15 DIAGNOSIS — G4733 Obstructive sleep apnea (adult) (pediatric): Secondary | ICD-10-CM | POA: Diagnosis not present

## 2019-08-15 NOTE — Progress Notes (Signed)
   Covid-19 Vaccination Clinic  Name:  Randy [name]    MRN: [id number] DOB: December 11, 1940  08/15/2019  Mr. Sayed was observed post Covid-19 immunization for 15 minutes without incidence. He was provided with Vaccine Information Sheet and instruction to access the V-Safe system.   Mr. Shurts was instructed to call 911 with any severe reactions post vaccine: Marland Kitchen Difficulty breathing  . Swelling of your face and throat  . A fast heartbeat  . A bad rash all over your body  . Dizziness and weakness    Immunizations Administered    Name Date Dose VIS Date Route   Pfizer COVID-19 Vaccine 08/15/2019  9:15 AM 0.3 mL 06/14/2019 Intramuscular   Manufacturer: [employer], [employer]   Lot: [id number]   [place]: [id number]

## 2019-09-11 ENCOUNTER — Telehealth: Payer: Self-pay | Admitting: Adult Health

## 2019-09-11 NOTE — Telephone Encounter (Signed)
LISINOPRIL FILLED FOR 9 MONTHS ON 03/08/2019.  REQUEST IS TOO EARLY.

## 2019-09-12 DIAGNOSIS — G4733 Obstructive sleep apnea (adult) (pediatric): Secondary | ICD-10-CM | POA: Diagnosis not present

## 2019-09-12 NOTE — Telephone Encounter (Signed)
Metformin filled for 90 days as directed.

## 2019-09-12 NOTE — Telephone Encounter (Signed)
A1c well controlled. Ok to fill for 90 days

## 2019-09-14 ENCOUNTER — Other Ambulatory Visit: Payer: Self-pay | Admitting: Adult Health

## 2019-09-17 NOTE — Telephone Encounter (Signed)
Pt called due to still not having prescription for Lisinopril at the pharmacy. I advised pt of previous message. He said the pharmacy does not have so would [facility] like him to stop taking the medication. Pt would like a call back to help with this.

## 2019-09-18 NOTE — Telephone Encounter (Signed)
Sent to the pharmacy by e-scribe. 

## 2019-09-18 NOTE — Telephone Encounter (Signed)
Pt notified to pick up new rx at the pharmacy. Nothing further needed.

## 2019-10-13 DIAGNOSIS — G4733 Obstructive sleep apnea (adult) (pediatric): Secondary | ICD-10-CM | POA: Diagnosis not present

## 2019-10-23 ENCOUNTER — Ambulatory Visit: Payer: Medicare PPO | Admitting: Orthotics

## 2019-10-23 ENCOUNTER — Other Ambulatory Visit: Payer: Self-pay

## 2019-10-23 DIAGNOSIS — M2021 Hallux rigidus, right foot: Secondary | ICD-10-CM | POA: Diagnosis not present

## 2019-10-23 DIAGNOSIS — E1140 Type 2 diabetes mellitus with diabetic neuropathy, unspecified: Secondary | ICD-10-CM | POA: Diagnosis not present

## 2019-10-23 DIAGNOSIS — M2022 Hallux rigidus, left foot: Secondary | ICD-10-CM | POA: Diagnosis not present

## 2019-11-12 DIAGNOSIS — G4733 Obstructive sleep apnea (adult) (pediatric): Secondary | ICD-10-CM | POA: Diagnosis not present

## 2019-11-20 ENCOUNTER — Other Ambulatory Visit: Payer: Self-pay

## 2019-11-21 ENCOUNTER — Ambulatory Visit (INDEPENDENT_AMBULATORY_CARE_PROVIDER_SITE_OTHER): Payer: Medicare PPO | Admitting: Adult Health

## 2019-11-21 ENCOUNTER — Encounter: Payer: Self-pay | Admitting: Adult Health

## 2019-11-21 ENCOUNTER — Ambulatory Visit (INDEPENDENT_AMBULATORY_CARE_PROVIDER_SITE_OTHER): Payer: Medicare PPO

## 2019-11-21 VITALS — BP 136/62 | HR 70 | Wt 174.0 lb

## 2019-11-21 DIAGNOSIS — R413 Other amnesia: Secondary | ICD-10-CM | POA: Diagnosis not present

## 2019-11-21 DIAGNOSIS — R079 Chest pain, unspecified: Secondary | ICD-10-CM | POA: Diagnosis not present

## 2019-11-21 DIAGNOSIS — R2681 Unsteadiness on feet: Secondary | ICD-10-CM

## 2019-11-21 DIAGNOSIS — R5383 Other fatigue: Secondary | ICD-10-CM | POA: Diagnosis not present

## 2019-11-21 LAB — URINALYSIS
Bilirubin Urine: NEGATIVE
Hgb urine dipstick: NEGATIVE
Ketones, ur: NEGATIVE
Leukocytes,Ua: NEGATIVE
Nitrite: NEGATIVE
Specific Gravity, Urine: 1.02 (ref 1.000–1.030)
Total Protein, Urine: NEGATIVE
Urine Glucose: NEGATIVE
Urobilinogen, UA: 0.2 (ref 0.0–1.0)
pH: 7 (ref 5.0–8.0)

## 2019-11-21 LAB — COMPREHENSIVE METABOLIC PANEL
ALT: 10 U/L (ref 0–53)
AST: 10 U/L (ref 0–37)
Albumin: 4.3 g/dL (ref 3.5–5.2)
Alkaline Phosphatase: 56 U/L (ref 39–117)
BUN: 25 mg/dL — ABNORMAL HIGH (ref 6–23)
CO2: 37 mEq/L — ABNORMAL HIGH (ref 19–32)
Calcium: 10.9 mg/dL — ABNORMAL HIGH (ref 8.4–10.5)
Chloride: 98 mEq/L (ref 96–112)
Creatinine, Ser: 1.22 mg/dL (ref 0.40–1.50)
GFR: 57.27 mL/min — ABNORMAL LOW (ref >60.00)
Glucose, Bld: 153 mg/dL — ABNORMAL HIGH (ref 70–99)
Potassium: 4.2 mEq/L (ref 3.5–5.1)
Sodium: 139 mEq/L (ref 135–145)
Total Bilirubin: 0.4 mg/dL (ref 0.2–1.2)
Total Protein: 6.3 g/dL (ref 6.0–8.3)

## 2019-11-21 LAB — CBC WITH DIFFERENTIAL/PLATELET
Basophils Absolute: 0 10*3/uL (ref 0.0–0.1)
Basophils Relative: 0.4 % (ref 0.0–3.0)
Eosinophils Absolute: 0.2 10*3/uL (ref 0.0–0.7)
Eosinophils Relative: 3.2 % (ref 0.0–5.0)
HCT: 41.5 % (ref 39.0–52.0)
Hemoglobin: 13.8 g/dL (ref 13.0–17.0)
Lymphocytes Relative: 21.6 % (ref 12.0–46.0)
Lymphs Abs: 1.4 10*3/uL (ref 0.7–4.0)
MCHC: 33.3 g/dL (ref 30.0–36.0)
MCV: 97.6 fl (ref 78.0–100.0)
Monocytes Absolute: 0.7 10*3/uL (ref 0.1–1.0)
Monocytes Relative: 10.9 % (ref 3.0–12.0)
Neutro Abs: 4.1 10*3/uL (ref 1.4–7.7)
Neutrophils Relative %: 63.9 % (ref 43.0–77.0)
Platelets: 184 10*3/uL (ref 150.0–400.0)
RBC: 4.25 Mil/uL (ref 4.22–5.81)
RDW: 13.1 % (ref 11.5–15.5)
WBC: 6.4 10*3/uL (ref 4.0–10.5)

## 2019-11-21 LAB — VITAMIN D 25 HYDROXY (VIT D DEFICIENCY, FRACTURES): VITD: 16.54 ng/mL — ABNORMAL LOW (ref 30.00–100.00)

## 2019-11-21 LAB — IBC + FERRITIN
Ferritin: 54 ng/mL (ref 22.0–322.0)
Iron: 89 ug/dL (ref 42–165)
Saturation Ratios: 21.6 % (ref 20.0–50.0)
Transferrin: 294 mg/dL (ref 212.0–360.0)

## 2019-11-21 LAB — BRAIN NATRIURETIC PEPTIDE: Pro B Natriuretic peptide (BNP): 39 pg/mL (ref 0.0–100.0)

## 2019-11-21 LAB — HEMOGLOBIN A1C: Hgb A1c MFr Bld: 6.3 % (ref 4.6–6.5)

## 2019-11-21 LAB — TSH: TSH: 2.12 u[IU]/mL (ref 0.35–4.50)

## 2019-11-21 LAB — VITAMIN B12: Vitamin B-12: 97 pg/mL — ABNORMAL LOW (ref 211–911)

## 2019-11-21 IMAGING — DX DG CHEST 2V
2 series · 2 of 2 positions shown · non-contrast
Comparison: None.

CLINICAL DATA: Right-sided chest pain

EXAM:
CHEST - 2 VIEW

[chest pa]
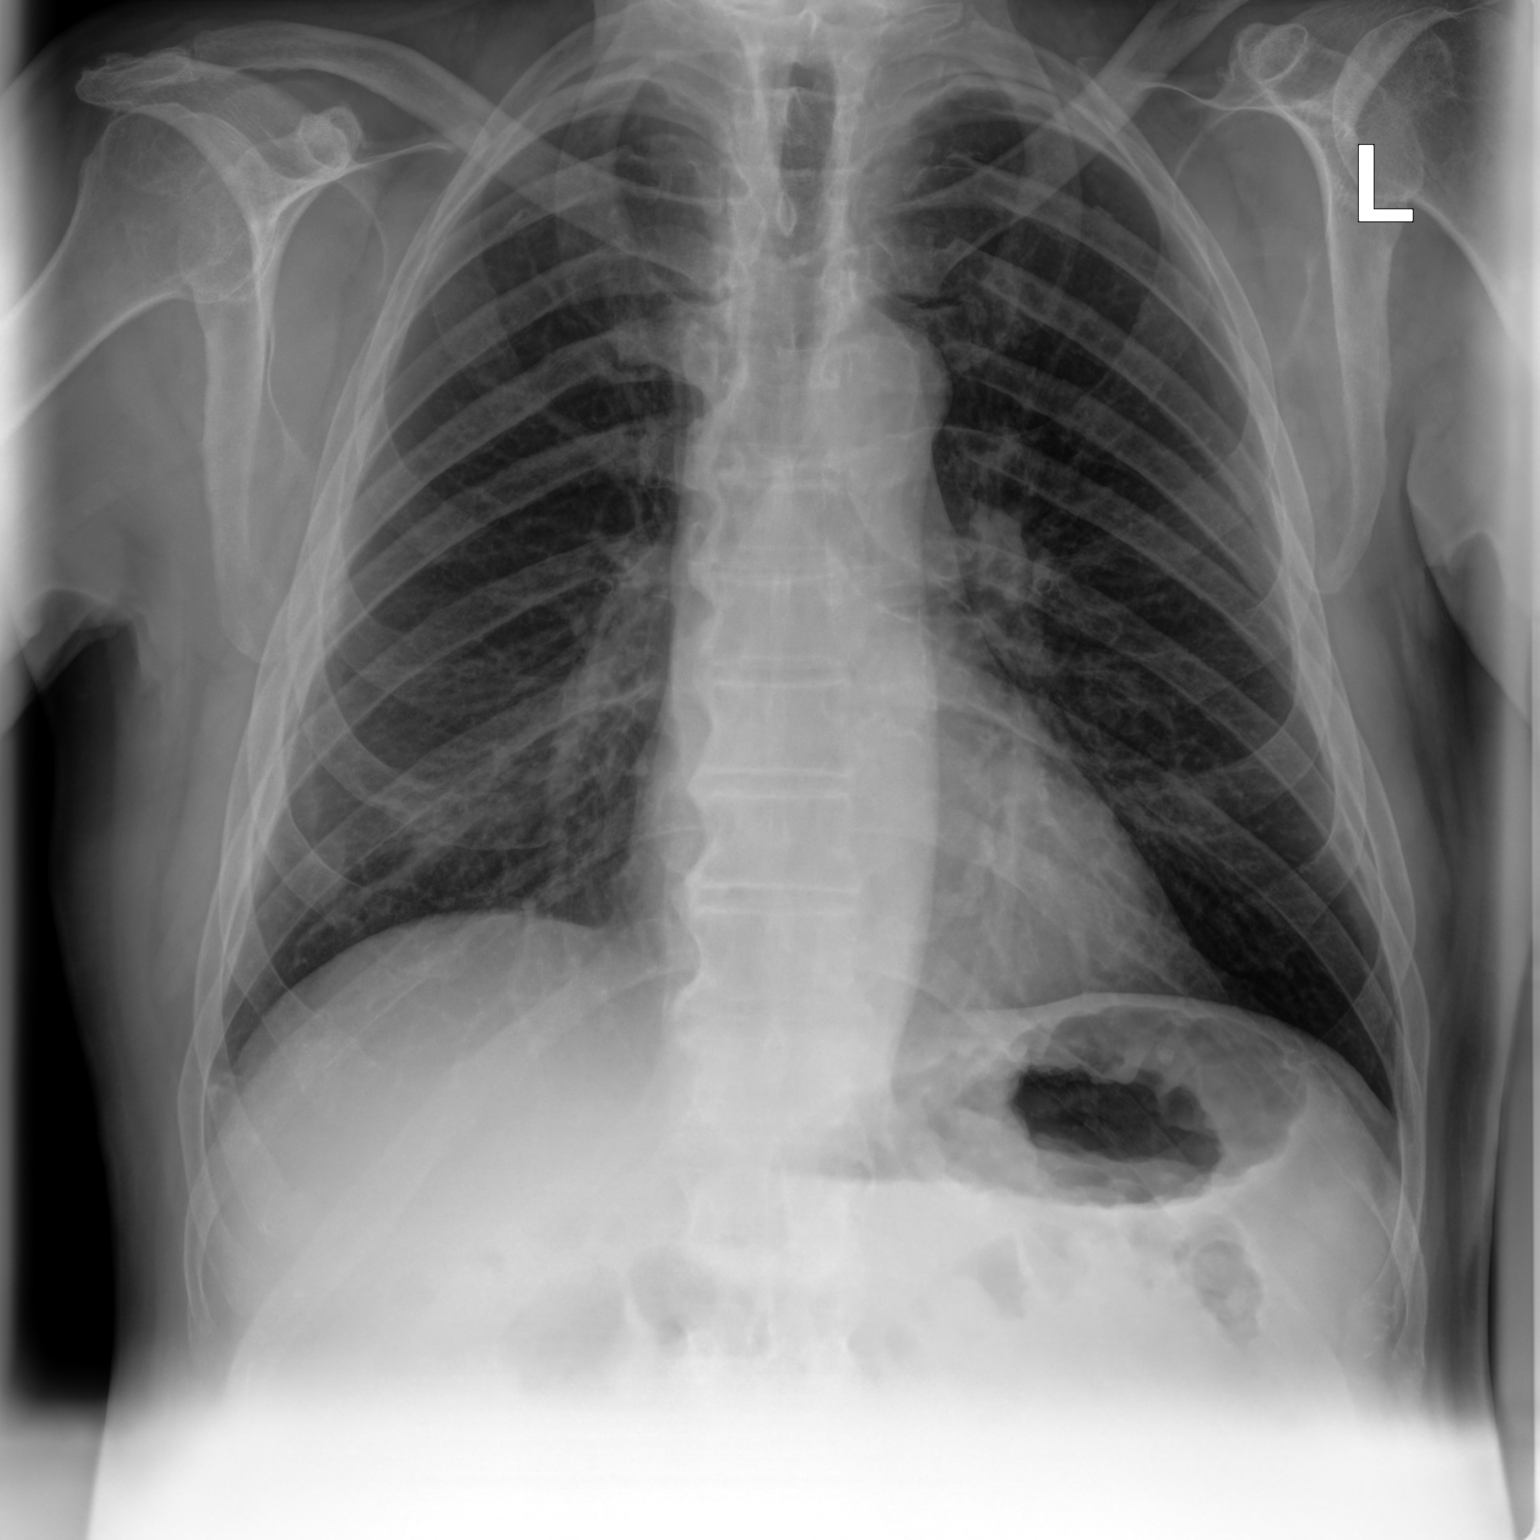

[chest lat]
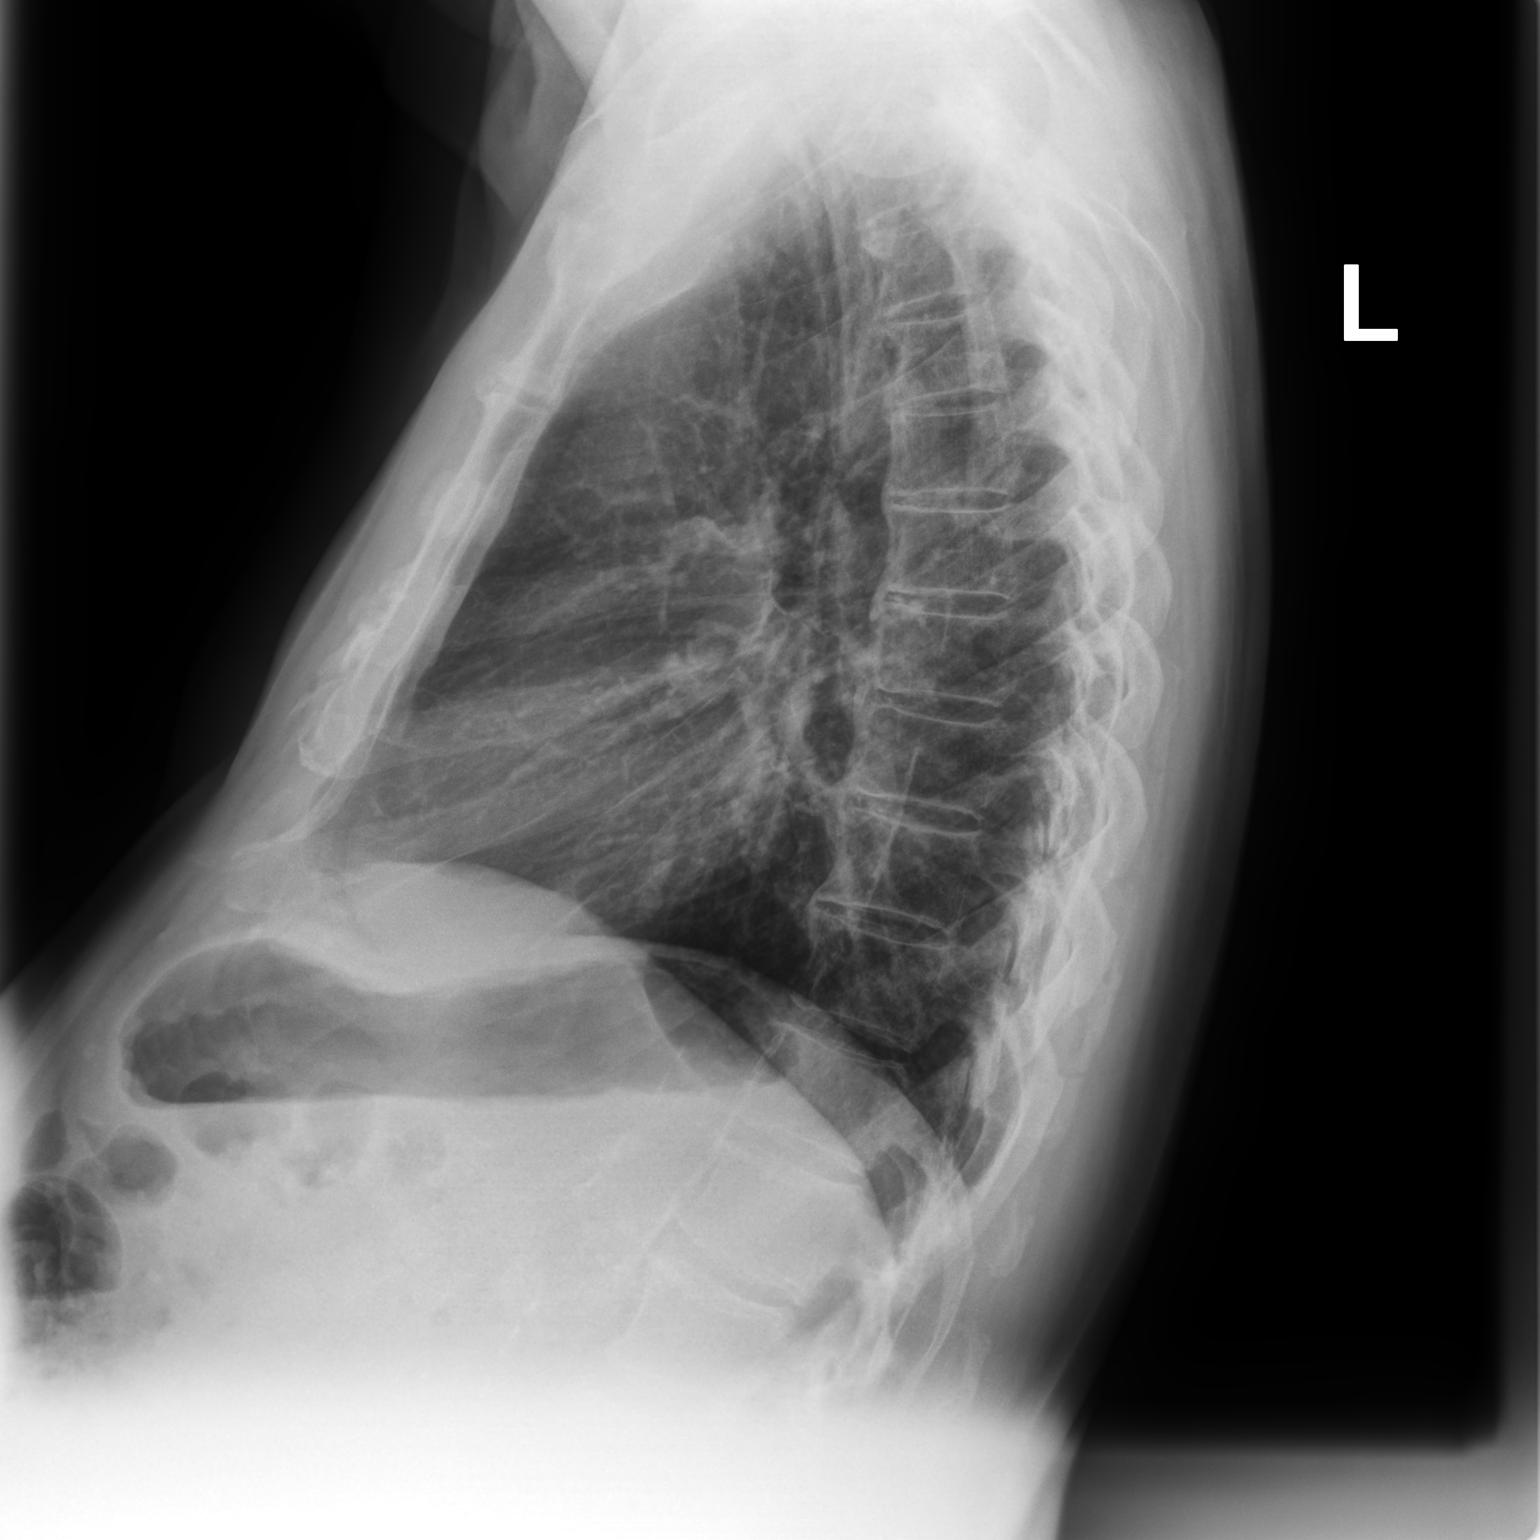

[2 of 2 positions shown; findings below may reference images not displayed]

FINDINGS: Cardiac shadow is within normal limits. The lungs are well aerated
bilaterally. Mild right basilar atelectasis is seen. No focal
confluent infiltrate is noted. Degenerative changes of the thoracic
spine are seen. Aortic calcifications are noted.
IMPRESSION: Mild right basilar atelectasis.

## 2019-11-21 NOTE — Patient Instructions (Signed)
It was great seeing you today   We will do some blood work and a chest xray to start the workup

## 2019-11-21 NOTE — Progress Notes (Signed)
Subjective:    Patient ID: Randy [name], male    DOB: 1941-06-30, 79 y.o.   MRN: [id number]  HPI 79 year old male who  has a past medical history of Diabetes mellitus, Hypertension, and OSA (obstructive sleep apnea) (11/24/2016).  He presents to the office with his wife for multiple generalized complaints that started over six months ago " it started after I got rid of my UTI that I had back in August and it took three antibiotics to get rid of it"  1. Unsteady Gait -his symptoms have been getting worse over the last month.  Whenever he is walking he feels unsteady this is especially apparent when going up and down stairs.  He feels as though he has to hold onto a railing or else he is going to fall.  2. Generalized Fatigue -he feels as though he has been pretty physically active in the past but as of the last few months he has been getting more fatigued easier.  He reports that he can hardly mow the yard without having to stop to rest for multiple hours at a time and even becomes tired unloading the dishwasher or walking up the stairs.  If he does anything physically taxing he feels fatigued for the rest of the day.  He denies shortness of breath but his wife reports "I hear him coughing whenever he does something active".  3. Memory -he also feels as though his memory is getting worse.  The best example he has is that he will often forget what he read in a book that he is reading, will forget the books name, and intermittently forgets his phone number or ZIP Code when he is using his savings cards to get gas.  He denies getting lost while driving, misplacing items around the house or forgetting names of people that he has close contact with.  He reports that he has not fallen and that his blood sugars are staying within normal limits.  He denies fevers, chills, shortness of breath, or chest pain.  He is using his CPAP at night.   Review of Systems See HPI   Past Medical History:    Diagnosis Date  . Diabetes mellitus   . Hypertension   . OSA (obstructive sleep apnea) 11/24/2016    Social History   Socioeconomic History  . Marital status: Married    Spouse name: Not on file  . Number of children: Not on file  . Years of education: Not on file  . Highest education level: Not on file  Occupational History  . Occupation: retired  Tobacco Use  . Smoking status: Former Smoker    Packs/day: 1.00    Years: 30.00    Pack years: 30.00    Types: Cigarettes    Quit date: 07/03/2001    Years since quitting: 18.3  . Smokeless tobacco: Former Systems developer    Types: [place] date: 07/04/1991  Substance and Sexual Activity  . Alcohol use: Yes    Alcohol/week: 3.0 standard drinks    Types: 3 Glasses of wine per week    Comment: daily alcohol wine with dinner  . Drug use: No  . Sexual activity: Yes  Other Topics Concern  . Not on file  Social History Narrative   Retired - Management consultant plants    Married       He likes to play golf. He likes to read and work in the yard.  Social Determinants of Health   Financial Resource Strain:   . Difficulty of Paying Living Expenses:   Food Insecurity:   . Worried About Charity fundraiser in the Last Year:   . Arboriculturist in the Last Year:   Transportation Needs:   . Film/video editor (Medical):   Marland Kitchen Lack of Transportation (Non-Medical):   Physical Activity:   . Days of Exercise per Week:   . Minutes of Exercise per Session:   Stress:   . Feeling of Stress :   Social Connections:   . Frequency of Communication with Friends and Family:   . Frequency of Social Gatherings with Friends and Family:   . Attends Religious Services:   . Active Member of Clubs or Organizations:   . Attends Archivist Meetings:   Marland Kitchen Marital Status:   Intimate Partner Violence:   . Fear of Current or Ex-Partner:   . Emotionally Abused:   Marland Kitchen Physically Abused:   . Sexually Abused:     Past Surgical History:   Procedure Laterality Date  . CATARACT EXTRACTION, BILATERAL Bilateral 2019  . COLONOSCOPY  07/06/2011   Procedure: COLONOSCOPY;  Surgeon: Winfield Cunas., MD;  Location: [facility] ENDOSCOPY;  Service: Endoscopy;  Laterality: N/A;  . ESOPHAGOGASTRODUODENOSCOPY  07/05/2011   Procedure: ESOPHAGOGASTRODUODENOSCOPY (EGD);  Surgeon: Landry Dyke, MD;  Location: [facility] ENDOSCOPY;  Service: Endoscopy;  Laterality: N/A;    Family History  Problem Relation Age of Onset  . Brain cancer Mother   . Hypertension Father   . Diabetes Father   . Stroke Father     No Known Allergies  Current Outpatient Medications on File Prior to Visit  Medication Sig Dispense Refill  . Blood Glucose Calibration (TRUE METRIX LEVEL 1) Low SOLN USE TO CHECK METER PRIOR TO TESTING SUGAR 3 each 0  . Blood Glucose Monitoring Suppl (TRUE METRIX AIR GLUCOSE METER) w/Device KIT 1 each by Does not apply route daily. 1 kit 0  . ketoconazole (NIZORAL) 2 % cream Apply 1 fingertip amount to each foot twice daily. 30 g 0  . lisinopril-hydrochlorothiazide (ZESTORETIC) 20-12.5 MG tablet TAKE ONE TABLET BY MOUTH DAILY 90 tablet 0  . metFORMIN (GLUCOPHAGE) 1000 MG tablet TAKE ONE TABLET BY MOUTH DAILY WITH BREAKFAST 90 tablet 0  . TRUE METRIX BLOOD GLUCOSE TEST test strip USE TO TEST BLOOD GLUCOSE TWICE DAILY. 200 each 3  . TRUEPLUS LANCETS 33G MISC USE TO CHECK BLOOD SUGAR DAILY AND PRN 300 each 0  . tamsulosin (FLOMAX) 0.4 MG CAPS capsule Take 1 capsule (0.4 mg total) by mouth daily. 30 capsule 1   No current facility-administered medications on file prior to visit.    BP 136/62   Pulse 70   Wt 174 lb (78.9 kg)   SpO2 97%   BMI 26.46 kg/m       Objective:   Physical Exam Vitals and nursing note reviewed.  Constitutional:      General: He is not in acute distress.    Appearance: Normal appearance. He is well-developed and normal weight.  HENT:     Head: Normocephalic and atraumatic.     Right Ear: Tympanic membrane, ear  canal and external ear normal. There is no impacted cerumen.     Left Ear: Tympanic membrane, ear canal and external ear normal. There is no impacted cerumen.     Nose: Nose normal. No congestion or rhinorrhea.     Mouth/Throat:     Mouth:  Mucous membranes are moist.     Pharynx: Oropharynx is clear. No oropharyngeal exudate or posterior oropharyngeal erythema.  Eyes:     General:        Right eye: No discharge.        Left eye: No discharge.     Extraocular Movements: Extraocular movements intact.     Conjunctiva/sclera: Conjunctivae normal.     Pupils: Pupils are equal, round, and reactive to light.  Neck:     Vascular: No carotid bruit.     Trachea: No tracheal deviation.  Cardiovascular:     Rate and Rhythm: Normal rate and regular rhythm.     Pulses: Normal pulses.     Heart sounds: Normal heart sounds. No murmur. No friction rub. No gallop.   Pulmonary:     Effort: Pulmonary effort is normal. No respiratory distress.     Breath sounds: Normal breath sounds. No stridor. No wheezing, rhonchi or rales.  Chest:     Chest wall: No tenderness.  Abdominal:     General: Bowel sounds are normal. There is no distension.     Palpations: Abdomen is soft. There is no mass.     Tenderness: There is no abdominal tenderness. There is no right CVA tenderness, left CVA tenderness, guarding or rebound.     Hernia: No hernia is present.  Musculoskeletal:        General: No swelling, tenderness, deformity or signs of injury. Normal range of motion.     Right lower leg: 2+ Pitting Edema present.     Left lower leg: 2+ Pitting Edema present.  Lymphadenopathy:     Cervical: No cervical adenopathy.  Skin:    General: Skin is warm and dry.     Capillary Refill: Capillary refill takes less than 2 seconds.     Coloration: Skin is not jaundiced or pale.     Findings: No bruising, erythema, lesion or rash.  Neurological:     General: No focal deficit present.     Mental Status: He is alert and  oriented to person, place, and time.     Cranial Nerves: No cranial nerve deficit.     Sensory: No sensory deficit.     Motor: No weakness.     Coordination: Coordination normal.     Gait: Gait normal.     Deep Tendon Reflexes: Reflexes normal.  Psychiatric:        Mood and Affect: Mood normal.        Behavior: Behavior normal.        Thought Content: Thought content normal.        Judgment: Judgment normal.       Assessment & Plan:  Unknown cause at this time.  Inform the patient and his wife that there are many causes of his generalized symptoms.  His Mini-Mental status exam was 28 out of 30, I do not think he has a cognitive decline and do not see a reason to get an MRI at this time.  We will start off with labs and a chest x-ray.  Can consider physical therapy in the future once labs have resulted.  Advised him and his wife that we could also consider seeing a neurologist for some of his symptoms as well.  They are in agreement with the plan  Dorothyann Peng, NP  Time spent on chart review, time with patient; discussion of symptoms,  home monitoring,  follow up plan, and documentation 30 minutes

## 2019-11-22 ENCOUNTER — Other Ambulatory Visit: Payer: Self-pay | Admitting: Adult Health

## 2019-11-22 LAB — URINE CULTURE
MICRO NUMBER:: 10501235
Result:: NO GROWTH
SPECIMEN QUALITY:: ADEQUATE

## 2019-11-22 MED ORDER — VITAMIN D (ERGOCALCIFEROL) 1.25 MG (50000 UNIT) PO CAPS
50000.0000 [IU] | ORAL_CAPSULE | ORAL | 1 refills | Status: DC
Start: 2019-11-22 — End: 2020-11-03

## 2019-11-25 ENCOUNTER — Encounter: Payer: Self-pay | Admitting: Adult Health

## 2019-11-27 ENCOUNTER — Other Ambulatory Visit: Payer: Self-pay | Admitting: *Deleted

## 2019-11-27 DIAGNOSIS — E538 Deficiency of other specified B group vitamins: Secondary | ICD-10-CM

## 2019-11-27 NOTE — Progress Notes (Signed)
Lab order entry

## 2019-11-28 ENCOUNTER — Other Ambulatory Visit: Payer: Self-pay

## 2019-11-28 ENCOUNTER — Ambulatory Visit (INDEPENDENT_AMBULATORY_CARE_PROVIDER_SITE_OTHER): Payer: Medicare PPO

## 2019-11-28 DIAGNOSIS — E538 Deficiency of other specified B group vitamins: Secondary | ICD-10-CM

## 2019-11-28 MED ORDER — CYANOCOBALAMIN 1000 MCG/ML IJ SOLN
1000.0000 ug | Freq: Once | INTRAMUSCULAR | Status: AC
  Administered 2019-11-28: 1000 ug via INTRAMUSCULAR

## 2019-11-28 NOTE — Patient Instructions (Signed)
Health Maintenance Due  Topic Date Due  . OPHTHALMOLOGY EXAM  08/14/2018    Depression screen [facility] 2/9 12/07/2018 11/07/2017 05/08/2015  Decreased Interest 0 0 0  Down, Depressed, Hopeless 0 0 0  PHQ - 2 Score 0 0 0

## 2019-11-28 NOTE — Progress Notes (Signed)
Per orders of Dorothyann Peng, NP, injection of B12 given in   deltoid by Franco Collet. Patient tolerated injection well.

## 2019-12-11 ENCOUNTER — Other Ambulatory Visit: Payer: Self-pay

## 2019-12-12 ENCOUNTER — Ambulatory Visit (INDEPENDENT_AMBULATORY_CARE_PROVIDER_SITE_OTHER): Payer: Medicare PPO

## 2019-12-12 DIAGNOSIS — E538 Deficiency of other specified B group vitamins: Secondary | ICD-10-CM

## 2019-12-12 MED ORDER — CYANOCOBALAMIN 1000 MCG/ML IJ SOLN
1000.0000 ug | Freq: Once | INTRAMUSCULAR | Status: AC
  Administered 2019-12-12: 1000 ug via INTRAMUSCULAR

## 2019-12-12 NOTE — Progress Notes (Signed)
Per orders of , injection of B12 given in Left deltoid by Franco Collet. Patient tolerated injection well.

## 2019-12-12 NOTE — Patient Instructions (Signed)
Health Maintenance Due  Topic Date Due  . Hepatitis C Screening  Never done  . OPHTHALMOLOGY EXAM  08/14/2018  . FOOT EXAM  12/07/2019    Depression screen [facility] 2/9 12/07/2018 11/07/2017 05/08/2015  Decreased Interest 0 0 0  Down, Depressed, Hopeless 0 0 0  PHQ - 2 Score 0 0 0

## 2019-12-13 DIAGNOSIS — G4733 Obstructive sleep apnea (adult) (pediatric): Secondary | ICD-10-CM | POA: Diagnosis not present

## 2019-12-16 ENCOUNTER — Other Ambulatory Visit: Payer: Self-pay | Admitting: Adult Health

## 2019-12-18 ENCOUNTER — Encounter: Payer: Self-pay | Admitting: Adult Health

## 2019-12-18 NOTE — Telephone Encounter (Signed)
Ok to refill. Coming every 6 months for A1c

## 2019-12-18 NOTE — Telephone Encounter (Signed)
Sent to the pharmacy by e-scribe. 

## 2020-01-01 ENCOUNTER — Encounter: Payer: Self-pay | Admitting: Adult Health

## 2020-01-02 ENCOUNTER — Other Ambulatory Visit: Payer: Self-pay

## 2020-01-02 ENCOUNTER — Other Ambulatory Visit (INDEPENDENT_AMBULATORY_CARE_PROVIDER_SITE_OTHER): Payer: Medicare PPO

## 2020-01-02 ENCOUNTER — Encounter: Payer: Self-pay | Admitting: Adult Health

## 2020-01-02 DIAGNOSIS — E538 Deficiency of other specified B group vitamins: Secondary | ICD-10-CM

## 2020-01-02 LAB — VITAMIN B12: Vitamin B-12: 158 pg/mL — ABNORMAL LOW (ref 211–911)

## 2020-01-03 ENCOUNTER — Ambulatory Visit (INDEPENDENT_AMBULATORY_CARE_PROVIDER_SITE_OTHER): Payer: Medicare PPO

## 2020-01-03 DIAGNOSIS — E538 Deficiency of other specified B group vitamins: Secondary | ICD-10-CM

## 2020-01-03 MED ORDER — CYANOCOBALAMIN 1000 MCG/ML IJ SOLN
1000.0000 ug | Freq: Once | INTRAMUSCULAR | Status: AC
  Administered 2020-01-03: 1000 ug via INTRAMUSCULAR

## 2020-01-03 NOTE — Patient Instructions (Signed)
Health Maintenance Due  Topic Date Due  . Hepatitis C Screening  Never done  . OPHTHALMOLOGY EXAM  08/14/2018  . FOOT EXAM  12/07/2019    Depression screen [facility] 2/9 12/07/2018 11/07/2017 05/08/2015  Decreased Interest 0 0 0  Down, Depressed, Hopeless 0 0 0  PHQ - 2 Score 0 0 0

## 2020-01-03 NOTE — Progress Notes (Addendum)
Per orders of Dorothyann Peng NP , injection of B12 given in R deltoid by Franco Collet. Patient tolerated injection well.  I have reviewed and agree with this injection.  Dorothyann Peng, NP

## 2020-01-14 ENCOUNTER — Ambulatory Visit (INDEPENDENT_AMBULATORY_CARE_PROVIDER_SITE_OTHER): Payer: Medicare PPO | Admitting: *Deleted

## 2020-01-14 ENCOUNTER — Other Ambulatory Visit: Payer: Self-pay

## 2020-01-14 DIAGNOSIS — E538 Deficiency of other specified B group vitamins: Secondary | ICD-10-CM | POA: Diagnosis not present

## 2020-01-14 MED ORDER — CYANOCOBALAMIN 1000 MCG/ML IJ SOLN
1000.0000 ug | Freq: Once | INTRAMUSCULAR | Status: AC
  Administered 2020-01-14: 1000 ug via INTRAMUSCULAR

## 2020-01-14 NOTE — Progress Notes (Signed)
Per orders of Dr. Dorothyann Peng, injection of B 12 given by Zacarias Pontes. Patient tolerated injection well.

## 2020-01-21 ENCOUNTER — Ambulatory Visit (INDEPENDENT_AMBULATORY_CARE_PROVIDER_SITE_OTHER): Payer: Medicare PPO

## 2020-01-21 ENCOUNTER — Other Ambulatory Visit: Payer: Self-pay

## 2020-01-21 DIAGNOSIS — E538 Deficiency of other specified B group vitamins: Secondary | ICD-10-CM | POA: Diagnosis not present

## 2020-01-21 MED ORDER — CYANOCOBALAMIN 1000 MCG/ML IJ SOLN
1000.0000 ug | Freq: Once | INTRAMUSCULAR | Status: AC
  Administered 2020-01-21: 1000 ug via INTRAMUSCULAR

## 2020-01-21 NOTE — Patient Instructions (Signed)
Health Maintenance Due  Topic Date Due  . Hepatitis C Screening  Never done  . OPHTHALMOLOGY EXAM  08/14/2018  . FOOT EXAM  12/07/2019    Depression screen [facility] 2/9 12/07/2018 11/07/2017 05/08/2015  Decreased Interest 0 0 0  Down, Depressed, Hopeless 0 0 0  PHQ - 2 Score 0 0 0

## 2020-01-21 NOTE — Progress Notes (Signed)
Per orders of Dorothyann Peng NP, injection of B12 given in Right  deltoid by Franco Collet. Patient tolerated injection well.

## 2020-01-28 ENCOUNTER — Ambulatory Visit (INDEPENDENT_AMBULATORY_CARE_PROVIDER_SITE_OTHER): Payer: Medicare PPO

## 2020-01-28 ENCOUNTER — Other Ambulatory Visit: Payer: Self-pay

## 2020-01-28 DIAGNOSIS — E538 Deficiency of other specified B group vitamins: Secondary | ICD-10-CM

## 2020-01-28 MED ORDER — CYANOCOBALAMIN 1000 MCG/ML IJ SOLN
1000.0000 ug | Freq: Once | INTRAMUSCULAR | Status: AC
  Administered 2020-01-28: 1000 ug via INTRAMUSCULAR

## 2020-01-28 NOTE — Progress Notes (Signed)
Per orders of Dr. Dorothyann Peng, injection of Cyanocobalamin 1,000 mcg/mL  given by Wyvonne Lenz. Patient tolerated injection well.

## 2020-02-04 ENCOUNTER — Other Ambulatory Visit: Payer: Self-pay

## 2020-02-04 ENCOUNTER — Ambulatory Visit (INDEPENDENT_AMBULATORY_CARE_PROVIDER_SITE_OTHER): Payer: Medicare PPO

## 2020-02-04 DIAGNOSIS — E538 Deficiency of other specified B group vitamins: Secondary | ICD-10-CM | POA: Diagnosis not present

## 2020-02-04 MED ORDER — CYANOCOBALAMIN 1000 MCG/ML IJ SOLN
1000.0000 ug | Freq: Once | INTRAMUSCULAR | Status: AC
  Administered 2020-02-04: 1000 ug via INTRAMUSCULAR

## 2020-02-04 NOTE — Progress Notes (Signed)
Per orders of Dr. Dorothyann Peng, injection of Cyanocobalamin 1,000 mcg/mL  given by Thornton Papas. In Right Deltoid.  Patient tolerated the injection well.

## 2020-02-11 ENCOUNTER — Ambulatory Visit (INDEPENDENT_AMBULATORY_CARE_PROVIDER_SITE_OTHER): Payer: Medicare PPO | Admitting: *Deleted

## 2020-02-11 ENCOUNTER — Other Ambulatory Visit: Payer: Self-pay

## 2020-02-11 DIAGNOSIS — E538 Deficiency of other specified B group vitamins: Secondary | ICD-10-CM

## 2020-02-11 MED ORDER — CYANOCOBALAMIN 1000 MCG/ML IJ SOLN
1000.0000 ug | Freq: Once | INTRAMUSCULAR | Status: AC
  Administered 2020-02-11: 1000 ug via INTRAMUSCULAR

## 2020-02-11 NOTE — Progress Notes (Signed)
Per orders of [employer], injection of weekly B 12 given by Zacarias Pontes. Patient tolerated injection well.

## 2020-02-12 ENCOUNTER — Encounter: Payer: Self-pay | Admitting: Adult Health

## 2020-02-12 DIAGNOSIS — E538 Deficiency of other specified B group vitamins: Secondary | ICD-10-CM

## 2020-02-14 NOTE — Addendum Note (Signed)
Addended by: Marrion Coy on: 02/14/2020 01:06 PM   Modules accepted: Orders

## 2020-02-18 ENCOUNTER — Ambulatory Visit (INDEPENDENT_AMBULATORY_CARE_PROVIDER_SITE_OTHER): Payer: Medicare PPO | Admitting: *Deleted

## 2020-02-18 ENCOUNTER — Other Ambulatory Visit: Payer: Medicare PPO

## 2020-02-18 ENCOUNTER — Other Ambulatory Visit: Payer: Self-pay

## 2020-02-18 DIAGNOSIS — E538 Deficiency of other specified B group vitamins: Secondary | ICD-10-CM | POA: Diagnosis not present

## 2020-02-18 MED ORDER — CYANOCOBALAMIN 1000 MCG/ML IJ SOLN
1000.0000 ug | Freq: Once | INTRAMUSCULAR | Status: AC
  Administered 2020-02-18: 1000 ug via INTRAMUSCULAR

## 2020-02-18 NOTE — Progress Notes (Signed)
Patient in office for B-12 injection. Injection administered with no immediate reaction.

## 2020-02-19 LAB — VITAMIN B12: Vitamin B-12: 720 pg/mL (ref 200–1100)

## 2020-02-25 ENCOUNTER — Other Ambulatory Visit: Payer: Self-pay

## 2020-02-25 ENCOUNTER — Ambulatory Visit (INDEPENDENT_AMBULATORY_CARE_PROVIDER_SITE_OTHER): Payer: Medicare PPO

## 2020-02-25 DIAGNOSIS — E538 Deficiency of other specified B group vitamins: Secondary | ICD-10-CM | POA: Diagnosis not present

## 2020-02-25 MED ORDER — CYANOCOBALAMIN 1000 MCG/ML IJ SOLN
1000.0000 ug | Freq: Once | INTRAMUSCULAR | Status: AC
  Administered 2020-02-25: 1000 ug via INTRAMUSCULAR

## 2020-02-25 NOTE — Progress Notes (Addendum)
Per orders of Nafziger, Tommi Rumps, NP, injection of B12 given in  Right deltoid by Franco Collet. Patient tolerated injection well.  Lab Results  Component Value Date   [id number] 02/18/2020

## 2020-02-25 NOTE — Patient Instructions (Signed)
Health Maintenance Due  Topic Date Due  . Hepatitis C Screening  Never done  . OPHTHALMOLOGY EXAM  08/14/2018  . FOOT EXAM  12/07/2019  . INFLUENZA VACCINE  02/02/2020    Depression screen [facility] 2/9 12/07/2018 11/07/2017 05/08/2015  Decreased Interest 0 0 0  Down, Depressed, Hopeless 0 0 0  PHQ - 2 Score 0 0 0

## 2020-02-26 ENCOUNTER — Encounter: Payer: Self-pay | Admitting: Adult Health

## 2020-02-26 ENCOUNTER — Ambulatory Visit (INDEPENDENT_AMBULATORY_CARE_PROVIDER_SITE_OTHER): Payer: Medicare PPO | Admitting: Adult Health

## 2020-02-26 VITALS — BP 140/60 | HR 81 | Temp 98.1°F | Ht 68.0 in | Wt 173.2 lb

## 2020-02-26 DIAGNOSIS — R0600 Dyspnea, unspecified: Secondary | ICD-10-CM | POA: Diagnosis not present

## 2020-02-26 DIAGNOSIS — M79605 Pain in left leg: Secondary | ICD-10-CM

## 2020-02-26 DIAGNOSIS — E559 Vitamin D deficiency, unspecified: Secondary | ICD-10-CM

## 2020-02-26 DIAGNOSIS — M79604 Pain in right leg: Secondary | ICD-10-CM

## 2020-02-26 DIAGNOSIS — R0609 Other forms of dyspnea: Secondary | ICD-10-CM

## 2020-02-26 LAB — VITAMIN D 25 HYDROXY (VIT D DEFICIENCY, FRACTURES): Vit D, 25-Hydroxy: 51 ng/mL (ref 30–100)

## 2020-02-26 NOTE — Progress Notes (Signed)
Subjective:    Patient ID: Randy [name], male    DOB: 11-19-1940, 79 y.o.   MRN: [id number]  HPI 79 year old male who is being evaluated today for follow-up regarding B12 deficiency, vitamin D deficiency, and fatigue   He was last seen in May 2021 planned of generalized symptoms started about 6 months prior.  Symptoms include generalized fatigue where he seemed to be getting fatigued more often with simple activities such as mowing his yard, unloading the dishwasher, and walking upstairs.  This time he denied shortness of breath or lower extremity claudication symptoms.  Still felt as though his memory was getting worse as he was often forgetting what he was reading in a book, forgetting phone numbers or ZIP Codes, etc.  He was not getting lost while driving, misplacing items around the house or forgetting names of people that he had close contact with.  Labs revealed of vitamin B12 level of 97, vitamin D level of 16.  TSH, urinalysis, BNP, and iron level were all normal.  He was started on vitamin D 50,000 units weekly as well as vitamin B12 injections.  Over the course of a few months his vitamin 12 level has come up and is now at a level of 720.   Unfortunately he continues to feel fatigued with DOE.  He reports that even walking to the mailbox which is about 150 feet away, or walking up stairs he has started to become short of breath and fatigue.  He also reports that he has noticed some lower extremity edema bilaterally and feels as though when he is ambulatory his legs feel "tired".  Still feels forgetful, especially when reading books as he cannot remember what he read.  His last MMSE was 28 out of 30.   Review of Systems See HPI   Past Medical History:  Diagnosis Date  . Diabetes mellitus   . Hypertension   . OSA (obstructive sleep apnea) 11/24/2016    Social History   Socioeconomic History  . Marital status: Married    Spouse name: Not on file  . Number of children: Not  on file  . Years of education: Not on file  . Highest education level: Not on file  Occupational History  . Occupation: retired  Tobacco Use  . Smoking status: Former Smoker    Packs/day: 1.00    Years: 30.00    Pack years: 30.00    Types: Cigarettes    Quit date: 07/03/2001    Years since quitting: 18.6  . Smokeless tobacco: Former Systems developer    Types: [place] date: 07/04/1991  Vaping Use  . Vaping Use: Never used  Substance and Sexual Activity  . Alcohol use: Yes    Alcohol/week: 3.0 standard drinks    Types: 3 Glasses of wine per week    Comment: daily alcohol wine with dinner  . Drug use: No  . Sexual activity: Yes  Other Topics Concern  . Not on file  Social History Narrative   Retired - Management consultant plants    Married       He likes to play golf. He likes to read and work in the yard.       Social Determinants of Health   Financial Resource Strain:   . Difficulty of Paying Living Expenses: Not on file  Food Insecurity:   . Worried About Charity fundraiser in the Last Year: Not on file  . Ran Out of Food in  the Last Year: Not on file  Transportation Needs:   . Lack of Transportation (Medical): Not on file  . Lack of Transportation (Non-Medical): Not on file  Physical Activity:   . Days of Exercise per Week: Not on file  . Minutes of Exercise per Session: Not on file  Stress:   . Feeling of Stress : Not on file  Social Connections:   . Frequency of Communication with Friends and Family: Not on file  . Frequency of Social Gatherings with Friends and Family: Not on file  . Attends Religious Services: Not on file  . Active Member of Clubs or Organizations: Not on file  . Attends Archivist Meetings: Not on file  . Marital Status: Not on file  Intimate Partner Violence:   . Fear of Current or Ex-Partner: Not on file  . Emotionally Abused: Not on file  . Physically Abused: Not on file  . Sexually Abused: Not on file    Past Surgical History:    Procedure Laterality Date  . CATARACT EXTRACTION, BILATERAL Bilateral 2019  . COLONOSCOPY  07/06/2011   Procedure: COLONOSCOPY;  Surgeon: Winfield Cunas., MD;  Location: [facility] ENDOSCOPY;  Service: Endoscopy;  Laterality: N/A;  . ESOPHAGOGASTRODUODENOSCOPY  07/05/2011   Procedure: ESOPHAGOGASTRODUODENOSCOPY (EGD);  Surgeon: Landry Dyke, MD;  Location: [facility] ENDOSCOPY;  Service: Endoscopy;  Laterality: N/A;    Family History  Problem Relation Age of Onset  . Brain cancer Mother   . Hypertension Father   . Diabetes Father   . Stroke Father     No Known Allergies  Current Outpatient Medications on File Prior to Visit  Medication Sig Dispense Refill  . Blood Glucose Calibration (TRUE METRIX LEVEL 1) Low SOLN USE TO CHECK METER PRIOR TO TESTING SUGAR 3 each 0  . Blood Glucose Monitoring Suppl (TRUE METRIX AIR GLUCOSE METER) w/Device KIT 1 each by Does not apply route daily. 1 kit 0  . ketoconazole (NIZORAL) 2 % cream Apply 1 fingertip amount to each foot twice daily. 30 g 0  . lisinopril-hydrochlorothiazide (ZESTORETIC) 20-12.5 MG tablet TAKE ONE TABLET BY MOUTH DAILY 90 tablet 0  . metFORMIN (GLUCOPHAGE) 1000 MG tablet TAKE ONE TABLET BY MOUTH DAILY WITH BREAKFAST 90 tablet 0  . TRUE METRIX BLOOD GLUCOSE TEST test strip USE TO TEST BLOOD GLUCOSE TWICE DAILY. 200 each 3  . TRUEPLUS LANCETS 33G MISC USE TO CHECK BLOOD SUGAR DAILY AND PRN 300 each 0  . Vitamin D, Ergocalciferol, (DRISDOL) 1.25 MG (50000 UNIT) CAPS capsule Take 1 capsule (50,000 Units total) by mouth every 7 (seven) days. 12 capsule 1  . tamsulosin (FLOMAX) 0.4 MG CAPS capsule Take 1 capsule (0.4 mg total) by mouth daily. 30 capsule 1   No current facility-administered medications on file prior to visit.    BP 140/60 (BP Location: Left Arm, Patient Position: Sitting, Cuff Size: Normal)   Pulse 81   Temp 98.1 F (36.7 C) (Oral)   Ht 5' 8"  (1.727 m)   Wt 173 lb 4 oz (78.6 kg)   SpO2 96%   BMI 26.34 kg/m        Objective:   Physical Exam Vitals and nursing note reviewed.  Constitutional:      Appearance: Normal appearance.  Cardiovascular:     Rate and Rhythm: Normal rate and regular rhythm.     Pulses: Normal pulses.     Heart sounds: Normal heart sounds. No murmur heard.  No friction rub. No gallop.  Pulmonary:     Effort: Pulmonary effort is normal.     Breath sounds: Normal breath sounds.  Musculoskeletal:     Right lower leg: 1+ Pitting Edema present.     Left lower leg: 1+ Pitting Edema present.  Skin:    General: Skin is warm and dry.     Capillary Refill: Capillary refill takes less than 2 seconds.  Neurological:     General: No focal deficit present.     Mental Status: He is alert and oriented to person, place, and time.  Psychiatric:        Mood and Affect: Mood normal.        Thought Content: Thought content normal.        Judgment: Judgment normal.       Assessment & Plan:  -Symptoms are obviously not related to vitamin B12 deficiency.  I am okay with him starting oral B12 so he no longer has to come into the office for B12 injections.  We will recheck vitamin D levels today.  Due to DOE, lower extremity edema, and possible claudication will refer over to cardiology for further evaluation.  I think his memory issues is not necessarily cognitive decline but could be some degree of depression due to not being as active as he once was.  I am hopeful that once he is evaluated by cardiology and if there is a possible cause that can be reversed then memory will improve as depression improves.  He does not want to go on any medication for depression at this time   Dorothyann Peng, NP

## 2020-03-18 ENCOUNTER — Encounter: Payer: Self-pay | Admitting: Internal Medicine

## 2020-03-18 ENCOUNTER — Ambulatory Visit: Payer: Medicare PPO | Admitting: Internal Medicine

## 2020-03-18 ENCOUNTER — Other Ambulatory Visit: Payer: Self-pay

## 2020-03-18 VITALS — BP 140/70 | HR 85 | Ht 68.0 in | Wt 170.0 lb

## 2020-03-18 DIAGNOSIS — E119 Type 2 diabetes mellitus without complications: Secondary | ICD-10-CM

## 2020-03-18 DIAGNOSIS — E1169 Type 2 diabetes mellitus with other specified complication: Secondary | ICD-10-CM

## 2020-03-18 DIAGNOSIS — I493 Ventricular premature depolarization: Secondary | ICD-10-CM

## 2020-03-18 DIAGNOSIS — R0600 Dyspnea, unspecified: Secondary | ICD-10-CM | POA: Diagnosis not present

## 2020-03-18 DIAGNOSIS — R0609 Other forms of dyspnea: Secondary | ICD-10-CM | POA: Insufficient documentation

## 2020-03-18 DIAGNOSIS — I10 Essential (primary) hypertension: Secondary | ICD-10-CM

## 2020-03-18 DIAGNOSIS — E7800 Pure hypercholesterolemia, unspecified: Secondary | ICD-10-CM | POA: Diagnosis not present

## 2020-03-18 NOTE — Patient Instructions (Addendum)
Medication Instructions:  Your physician recommends that you continue on your current medications as directed. Please refer to the Current Medication list given to you today.  *If you need a refill on your cardiac medications before your next appointment, please call your pharmacy*   Lab Work: None If you have labs (blood work) drawn today and your tests are completely normal, you will receive your results only by: Marland Kitchen MyChart Message (if you have MyChart) OR . A paper copy in the mail If you have any lab test that is abnormal or we need to change your treatment, we will call you to review the results.   Testing/Procedures: Your physician has requested that you have en exercise stress myoview. For further information please visit [email or web address]. Please follow instruction sheet, as given.     Follow-Up: At [facility], you and your health needs are our priority.  As part of our continuing mission to provide you with exceptional heart care, we have created designated Provider Care Teams.  These Care Teams include your primary Cardiologist (physician) and Advanced Practice Providers (APPs -  Physician Assistants and Nurse Practitioners) who all work together to provide you with the care you need, when you need it.  We recommend signing up for the patient portal called "MyChart".  Sign up information is provided on this After Visit Summary.  MyChart is used to connect with patients for Virtual Visits (Telemedicine).  Patients are able to view lab/test results, encounter notes, upcoming appointments, etc.  Non-urgent messages can be sent to your provider as well.   To learn more about what you can do with MyChart, go to [email or web address].    Your next appointment:   6 month(s)  The format for your next appointment:   In Person  Provider:   Renee Harder, MD   Other Instructions COVID SCREENING INFORMATION: You are scheduled for your drive-thru COVID screening on      between       Pre-Procedural COVID-19 Testing Site [address] [place],  [address] You will need to go home after your screening and quarantine until your procedure.

## 2020-03-18 NOTE — Progress Notes (Signed)
Cardiology Office Note:    Date:  03/18/2020   ID:  Randy [name], DOB Nov 23, 1940, MRN [id number]  PCP:  Dorothyann Peng, NP  [facility] Cardiologist:  Werner Lean, MD  [facility] Electrophysiologist:  None   Referring MD: Dorothyann Peng, NP   CC: shortness of breath Consulted for the evaluation of exertional shortness of breath at the behest of Hanna, Tommi Rumps, NP  History of Present Illness:    Randy [name] is a 79 y.o. male with a hx of diabetes mellitus with hypertension (A1c 6.3, last office BP 140/60), HLD (LDL 101 on no medication) OSA on CPAP, who presents for exertional dyspnea.  Patient notes that in the past he had a UTI and sepsis leading to admission and prolonged antibiotic course.  During this, patient was decondition and felt week; improved from outpatient physical therapy. Patient notes general weakness and malaise.  Feels dyspnea on exertion.  Can walk a mile up and down.  Worsen with exertion, resolved with rest.  No chest pain, chest tightness or chest heaviness.  Wife notes that occasional has a pain in his chest but not associated with activity.  No prior heart testing.  Has limited activity lately because of fatigue.  No mechanical issues.  Past Medical History:  Diagnosis Date  . Diabetes mellitus   . Hypertension   . OSA (obstructive sleep apnea) 11/24/2016    Past Surgical History:  Procedure Laterality Date  . CATARACT EXTRACTION, BILATERAL Bilateral 2019  . COLONOSCOPY  07/06/2011   Procedure: COLONOSCOPY;  Surgeon: Winfield Cunas., MD;  [facility] ENDOSCOPY;  Service: Endoscopy;  Laterality: N/A;  . ESOPHAGOGASTRODUODENOSCOPY  07/05/2011   Procedure: ESOPHAGOGASTRODUODENOSCOPY (EGD);  Surgeon: Landry Dyke, MD;  Location: [facility] ENDOSCOPY;  Service: Endoscopy;  Laterality: N/A;    Current Medications: Current Meds  Medication Sig  . Blood Glucose Calibration (TRUE METRIX LEVEL 1) Low SOLN USE TO CHECK METER PRIOR TO TESTING  SUGAR  . Blood Glucose Monitoring Suppl (TRUE METRIX AIR GLUCOSE METER) w/Device KIT 1 each by Does not apply route daily.  Marland Kitchen ketoconazole (NIZORAL) 2 % cream Apply 1 fingertip amount to each foot twice daily.  Marland Kitchen lisinopril-hydrochlorothiazide (ZESTORETIC) 20-12.5 MG tablet TAKE ONE TABLET BY MOUTH DAILY  . metFORMIN (GLUCOPHAGE) 1000 MG tablet TAKE ONE TABLET BY MOUTH DAILY WITH BREAKFAST  . tamsulosin (FLOMAX) 0.4 MG CAPS capsule Take 1 capsule (0.4 mg total) by mouth daily.  . TRUE METRIX BLOOD GLUCOSE TEST test strip USE TO TEST BLOOD GLUCOSE TWICE DAILY.  Marland Kitchen TRUEPLUS LANCETS 33G MISC USE TO CHECK BLOOD SUGAR DAILY AND PRN  . Vitamin D, Ergocalciferol, (DRISDOL) 1.25 MG (50000 UNIT) CAPS capsule Take 1 capsule (50,000 Units total) by mouth every 7 (seven) days.   Allergies:   Drug ingredient [atorvastatin]   Social History   Socioeconomic History  . Marital status: Married    Spouse name: Not on file  . Number of children: Not on file  . Years of education: Not on file  . Highest education level: Not on file  Occupational History  . Occupation: retired  Tobacco Use  . Smoking status: Former Smoker    Packs/day: 1.00    Years: 30.00    Pack years: 30.00    Types: Cigarettes    Quit date: 07/03/2001    Years since quitting: 18.7  . Smokeless tobacco: Former Systems developer    Types: [place] date: 07/04/1991  Vaping Use  . Vaping  Use: Never used  Substance and Sexual Activity  . Alcohol use: Yes    Alcohol/week: 3.0 standard drinks    Types: 3 Glasses of wine per week    Comment: daily alcohol wine with dinner  . Drug use: No  . Sexual activity: Yes  Other Topics Concern  . Not on file  Social History Narrative   Retired - Management consultant plants    Married       He likes to play golf. He likes to read and work in the yard.       Social Determinants of Health   Financial Resource Strain:   . Difficulty of Paying Living Expenses: Not on file  Food Insecurity:   .  Worried About Charity fundraiser in the Last Year: Not on file  . Ran Out of Food in the Last Year: Not on file  Transportation Needs:   . Lack of Transportation (Medical): Not on file  . Lack of Transportation (Non-Medical): Not on file  Physical Activity:   . Days of Exercise per Week: Not on file  . Minutes of Exercise per Session: Not on file  Stress:   . Feeling of Stress : Not on file  Social Connections:   . Frequency of Communication with Friends and Family: Not on file  . Frequency of Social Gatherings with Friends and Family: Not on file  . Attends Religious Services: Not on file  . Active Member of Clubs or Organizations: Not on file  . Attends Archivist Meetings: Not on file  . Marital Status: Not on file   Family History: The patient's family history includes Brain cancer in his mother; Diabetes in his father; Hypertension in his father; Stroke in his father.  ROS:   Please see the history of present illness.   Denies depressed mood or anhedonia. All other systems reviewed and are negative.  EKGs/Labs/Other Studies Reviewed:    The following studies were reviewed today:  EKG:  EKG is ordered today.  The ekg ordered today demonstrates Sinus arrythmia Ventricular rate ~ 85 bpm, occasional PVC, isolated q wave in V1 possible anterior infarct); PVC new from prior. 12/19/13 EKG: Sinus rhythm 82, isolated q wave in V1 (possible anterior infarct)  Recent Labs: 11/21/2019: ALT 10; BUN 25; Creatinine, Ser 1.22; Hemoglobin 13.8; Platelets 184.0; Potassium 4.2; Pro B Natriuretic peptide (BNP) 39.0; Sodium 139; TSH 2.12  Recent Lipid Panel    Component Value Date/Time   CHOL 181 12/07/2018 1157   TRIG 54.0 12/07/2018 1157   HDL 69.60 12/07/2018 1157   CHOLHDL 3 12/07/2018 1157   VLDL 10.8 12/07/2018 1157   LDLCALC 101 (H) 12/07/2018 1157   A1c 6.3 Creatinine 1.22  Physical Exam:    VS:  BP 140/70   Pulse 85   Ht 5' 8"  (1.727 m)   Wt 170 lb (77.1 kg)    SpO2 94%   BMI 25.85 kg/m     Wt Readings from Last 3 Encounters:  03/18/20 170 lb (77.1 kg)  02/26/20 173 lb 4 oz (78.6 kg)  11/21/19 174 lb (78.9 kg)     GEN: Well nourished, well developed in no acute distress HEENT: Normal NECK: No JVD; No carotid bruits LYMPHATICS: No lymphadenopathy CARDIAC: RRR, no murmurs, rubs, gallops RESPIRATORY:  Clear to auscultation without rales, wheezing or rhonchi  ABDOMEN: Soft, non-tender, non-distended MUSCULOSKELETAL:  No edema; No deformity  SKIN: Warm and dry NEUROLOGIC:  Alert and oriented x 3 PSYCHIATRIC:  Normal  affect   ASSESSMENT:    1. Diabetes mellitus with coincident hypertension ([place])   2. Type 2 diabetes mellitus with other specified complication, without long-term current use of insulin (HCC)   3. Pure hypercholesterolemia    PLAN:    In order of problems listed above:  Exertional dyspnea, Diabetes with HTN; HLD - The patient presents with atypical/typical chest pain.  - CVD risk factors include HTN, HLD, DM, ASVCD risk 45%, former smoker.  - Discussed trial of BB and Zetia (prior statin intolerant); prn nitro but patient refused additional medications, preferring to defer unless sx escalate or the results of his stress test - would recommend an exercise nuclear medicine stress test (NPO at midnight/hold beta blocker in AM); discussed risks, benefits, and alternatives of the diagnostic procedure including chest pain, arrhythmia, and death.  Patient amenable for testing. - will follow EF from NM stress before further additional imaging. 2. PVC - deferred BB (in conjunction with above)  Will see in 6 months unless new sx   Medication Adjustments/Labs and Tests Ordered: Current medicines are reviewed at length with the patient today.  Concerns regarding medicines are outlined above.  No orders of the defined types were placed in this encounter.  No orders of the defined types were placed in this encounter.   There are  no Patient Instructions on file for this visit.   Signed, Werner Lean, MD  03/18/2020 4:01 PM    [facility]

## 2020-03-19 ENCOUNTER — Other Ambulatory Visit: Payer: Self-pay | Admitting: Adult Health

## 2020-03-23 ENCOUNTER — Other Ambulatory Visit (HOSPITAL_COMMUNITY)
Admission: RE | Admit: 2020-03-23 | Discharge: 2020-03-23 | Disposition: A | Discharge status: Home / Self Care | Payer: Medicare PPO | Source: Ambulatory Visit | Attending: Internal Medicine | Admitting: Internal Medicine

## 2020-03-23 DIAGNOSIS — Z20822 Contact with and (suspected) exposure to covid-19: Secondary | ICD-10-CM | POA: Insufficient documentation

## 2020-03-23 DIAGNOSIS — Z01812 Encounter for preprocedural laboratory examination: Secondary | ICD-10-CM | POA: Insufficient documentation

## 2020-03-23 LAB — SARS CORONAVIRUS 2 (TAT 6-24 HRS): SARS Coronavirus 2: NEGATIVE

## 2020-03-24 ENCOUNTER — Other Ambulatory Visit: Payer: Self-pay | Admitting: Adult Health

## 2020-03-25 ENCOUNTER — Telehealth (HOSPITAL_COMMUNITY): Payer: Self-pay | Admitting: *Deleted

## 2020-03-25 NOTE — Telephone Encounter (Signed)
Patient given detailed instructions per Myocardial Perfusion Study Information Sheet for the test on 03/26/20 at 8:00. Patient notified to arrive 15 minutes early and that it is imperative to arrive on time for appointment to keep from having the test rescheduled.  If you need to cancel or reschedule your appointment, please call the office within 24 hours of your appointment. . Patient verbalized understanding.Randy [name]

## 2020-03-26 ENCOUNTER — Ambulatory Visit (HOSPITAL_COMMUNITY): Payer: Medicare PPO | Attending: Internal Medicine

## 2020-03-26 ENCOUNTER — Telehealth: Payer: Self-pay | Admitting: Internal Medicine

## 2020-03-26 ENCOUNTER — Other Ambulatory Visit: Payer: Self-pay

## 2020-03-26 DIAGNOSIS — R0600 Dyspnea, unspecified: Secondary | ICD-10-CM | POA: Insufficient documentation

## 2020-03-26 DIAGNOSIS — R0609 Other forms of dyspnea: Secondary | ICD-10-CM

## 2020-03-26 LAB — MYOCARDIAL PERFUSION IMAGING
Estimated workload: 7 METS
Exercise duration (min): 5 min
Exercise duration (sec): 50 s
LV dias vol: 72 mL (ref 62–150)
LV sys vol: 32 mL
MPHR: 141 {beats}/min
Peak HR: 129 {beats}/min
Percent HR: 91 %
RPE: 18
Rest HR: 68 {beats}/min
SDS: 0
SRS: 0
SSS: 0
TID: 1.13

## 2020-03-26 MED ORDER — TECHNETIUM TC 99M TETROFOSMIN IV KIT
32.1000 | PACK | Freq: Once | INTRAVENOUS | Status: AC | PRN
  Administered 2020-03-26: 32.1 via INTRAVENOUS
  Filled 2020-03-26: qty 33

## 2020-03-26 MED ORDER — TECHNETIUM TC 99M TETROFOSMIN IV KIT
10.2000 | PACK | Freq: Once | INTRAVENOUS | Status: AC | PRN
  Administered 2020-03-26: 10.2 via INTRAVENOUS
  Filled 2020-03-26: qty 11

## 2020-03-26 NOTE — Telephone Encounter (Signed)
Patient returning call for stress test results. 

## 2020-03-26 NOTE — Telephone Encounter (Signed)
See results note ./cy 

## 2020-03-31 ENCOUNTER — Other Ambulatory Visit: Payer: Self-pay

## 2020-03-31 ENCOUNTER — Ambulatory Visit (INDEPENDENT_AMBULATORY_CARE_PROVIDER_SITE_OTHER): Payer: Medicare PPO

## 2020-03-31 DIAGNOSIS — Z0000 Encounter for general adult medical examination without abnormal findings: Secondary | ICD-10-CM | POA: Diagnosis not present

## 2020-03-31 NOTE — Patient Instructions (Signed)
[name] , Thank you for taking time to come for your Medicare Wellness Visit. I appreciate your ongoing commitment to your health goals. Please review the following plan we discussed and let me know if I can assist you in the future.   Screening recommendations/referrals: Colonoscopy: No longer required Recommended yearly ophthalmology/optometry visit for glaucoma screening and checkup Recommended yearly dental visit for hygiene and checkup  Vaccinations: Influenza vaccine: Currently due, you may receive at our office or your local pharmacy  Pneumococcal vaccine: completed series Tdap vaccine: Up to date, next due 04/12/2023 Shingles vaccine: Currently Due for shingrix, please contact your pharmacy to discuss cost and to receive vaccines     Advanced directives: please bring a copy of your medical advanced directives into our office so that we may make a copy to scan into your chart.  Conditions/risks identified: None  Next appointment: None  Preventive Care 65 Years and Older, Male Preventive care refers to lifestyle choices and visits with your health care provider that can promote health and wellness. What does preventive care include?  A yearly physical exam. This is also called an annual well check.  Dental exams once or twice a year.  Routine eye exams. Ask your health care provider how often you should have your eyes checked.  Personal lifestyle choices, including:  Daily care of your teeth and gums.  Regular physical activity.  Eating a healthy diet.  Avoiding tobacco and drug use.  Limiting alcohol use.  Practicing safe sex.  Taking low doses of aspirin every day.  Taking vitamin and mineral supplements as recommended by your health care provider. What happens during an annual well check? The services and screenings done by your health care provider during your annual well check will depend on your age, overall health, lifestyle risk factors, and family  history of disease. Counseling  Your health care provider may ask you questions about your:  Alcohol use.  Tobacco use.  Drug use.  Emotional well-being.  Home and relationship well-being.  Sexual activity.  Eating habits.  History of falls.  Memory and ability to understand (cognition).  Work and work Statistician. Screening  You may have the following tests or measurements:  Height, weight, and BMI.  Blood pressure.  Lipid and cholesterol levels. These may be checked every 5 years, or more frequently if you are over 8 years old.  Skin check.  Lung cancer screening. You may have this screening every year starting at age 40 if you have a 30-pack-year history of smoking and currently smoke or have quit within the past 15 years.  Fecal occult blood test (FOBT) of the stool. You may have this test every year starting at age 26.  Flexible sigmoidoscopy or colonoscopy. You may have a sigmoidoscopy every 5 years or a colonoscopy every 10 years starting at age 36.  Prostate cancer screening. Recommendations will vary depending on your family history and other risks.  Hepatitis C blood test.  Hepatitis B blood test.  Sexually transmitted disease (STD) testing.  Diabetes screening. This is done by checking your blood sugar (glucose) after you have not eaten for a while (fasting). You may have this done every 1-3 years.  Abdominal aortic aneurysm (AAA) screening. You may need this if you are a current or former smoker.  Osteoporosis. You may be screened starting at age 70 if you are at high risk. Talk with your health care provider about your test results, treatment options, and if necessary, the need for  more tests. Vaccines  Your health care provider may recommend certain vaccines, such as:  Influenza vaccine. This is recommended every year.  Tetanus, diphtheria, and acellular pertussis (Tdap, Td) vaccine. You may need a Td booster every 10 years.  Zoster vaccine.  You may need this after age 48.  Pneumococcal 13-valent conjugate (PCV13) vaccine. One dose is recommended after age 34.  Pneumococcal polysaccharide (PPSV23) vaccine. One dose is recommended after age 28. Talk to your health care provider about which screenings and vaccines you need and how often you need them. This information is not intended to replace advice given to you by your health care provider. Make sure you discuss any questions you have with your health care provider. Document Released: 07/17/2015 Document Revised: 03/09/2016 Document Reviewed: 04/21/2015 Elsevier Interactive Patient Education  2017 [place] Prevention in the Home Falls can cause injuries. They can happen to people of all ages. There are many things you can do to make your home safe and to help prevent falls. What can I do on the outside of my home?  Regularly fix the edges of walkways and driveways and fix any cracks.  Remove anything that might make you trip as you walk through a door, such as a raised step or threshold.  Trim any bushes or trees on the path to your home.  Use bright outdoor lighting.  Clear any walking paths of anything that might make someone trip, such as rocks or tools.  Regularly check to see if handrails are loose or broken. Make sure that both sides of any steps have handrails.  Any raised decks and porches should have guardrails on the edges.  Have any leaves, snow, or ice cleared regularly.  Use sand or salt on walking paths during winter.  Clean up any spills in your garage right away. This includes oil or grease spills. What can I do in the bathroom?  Use night lights.  Install grab bars by the toilet and in the tub and shower. Do not use towel bars as grab bars.  Use non-skid mats or decals in the tub or shower.  If you need to sit down in the shower, use a plastic, non-slip stool.  Keep the floor dry. Clean up any water that spills on the floor as soon  as it happens.  Remove soap buildup in the tub or shower regularly.  Attach bath mats securely with double-sided non-slip rug tape.  Do not have throw rugs and other things on the floor that can make you trip. What can I do in the bedroom?  Use night lights.  Make sure that you have a light by your bed that is easy to reach.  Do not use any sheets or blankets that are too big for your bed. They should not hang down onto the floor.  Have a firm chair that has side arms. You can use this for support while you get dressed.  Do not have throw rugs and other things on the floor that can make you trip. What can I do in the kitchen?  Clean up any spills right away.  Avoid walking on wet floors.  Keep items that you use a lot in easy-to-reach places.  If you need to reach something above you, use a strong step stool that has a grab bar.  Keep electrical cords out of the way.  Do not use floor polish or wax that makes floors slippery. If you must use wax, use non-skid  floor wax.  Do not have throw rugs and other things on the floor that can make you trip. What can I do with my stairs?  Do not leave any items on the stairs.  Make sure that there are handrails on both sides of the stairs and use them. Fix handrails that are broken or loose. Make sure that handrails are as long as the stairways.  Check any carpeting to make sure that it is firmly attached to the stairs. Fix any carpet that is loose or worn.  Avoid having throw rugs at the top or bottom of the stairs. If you do have throw rugs, attach them to the floor with carpet tape.  Make sure that you have a light switch at the top of the stairs and the bottom of the stairs. If you do not have them, ask someone to add them for you. What else can I do to help prevent falls?  Wear shoes that:  Do not have high heels.  Have rubber bottoms.  Are comfortable and fit you well.  Are closed at the toe. Do not wear sandals.  If  you use a stepladder:  Make sure that it is fully opened. Do not climb a closed stepladder.  Make sure that both sides of the stepladder are locked into place.  Ask someone to hold it for you, if possible.  Clearly mark and make sure that you can see:  Any grab bars or handrails.  First and last steps.  Where the edge of each step is.  Use tools that help you move around (mobility aids) if they are needed. These include:  Canes.  Walkers.  Scooters.  Crutches.  Turn on the lights when you go into a dark area. Replace any light bulbs as soon as they burn out.  Set up your furniture so you have a clear path. Avoid moving your furniture around.  If any of your floors are uneven, fix them.  If there are any pets around you, be aware of where they are.  Review your medicines with your doctor. Some medicines can make you feel dizzy. This can increase your chance of falling. Ask your doctor what other things that you can do to help prevent falls. This information is not intended to replace advice given to you by your health care provider. Make sure you discuss any questions you have with your health care provider. Document Released: 04/16/2009 Document Revised: 11/26/2015 Document Reviewed: 07/25/2014 Elsevier Interactive Patient Education  2017 [employer].

## 2020-03-31 NOTE — Progress Notes (Signed)
Subjective:   Randy [name] is a 79 y.o. male who presents for Medicare Annual/Subsequent preventive examination.  Review of Systems    N/A Cardiac Risk Factors include: advanced age (>73mn, >>74women);diabetes mellitus;hypertension;dyslipidemia;male gender     Objective:    Today's Vitals   There is no height or weight on file to calculate BMI.  Advanced Directives 03/31/2020 12/10/2018 12/21/2016 07/05/2011 07/04/2011  Does Patient Have a Medical Advance Directive? Yes Yes Yes Patient does not have advance directive Patient has advance directive, copy not in chart  Type of Advance Directive [place] will [place]Living will HLuis Llorens TorresLiving will HMetaline FallsLiving will Living will  Does patient want to make changes to medical advance directive? No - Patient declined No - Patient declined No - Patient declined - -  Copy of [place] Chart? No - copy requested No - copy requested No - copy requested - Copy requested from family  Pre-existing out of facility DNR order (yellow form or pink MOST form) - - - - Yes, notify physician for inpatient order    Current Medications (verified) Outpatient Encounter Medications as of 03/31/2020  Medication Sig  . Blood Glucose Calibration (TRUE METRIX LEVEL 1) Low SOLN USE TO CHECK METER PRIOR TO TESTING SUGAR  . Blood Glucose Monitoring Suppl (TRUE METRIX AIR GLUCOSE METER) w/Device KIT 1 each by Does not apply route daily.  .Marland Kitchenlisinopril-hydrochlorothiazide (ZESTORETIC) 20-12.5 MG tablet TAKE ONE TABLET BY MOUTH DAILY  . metFORMIN (GLUCOPHAGE) 1000 MG tablet TAKE ONE TABLET BY MOUTH DAILY WITH BREAKFAST  . TRUE METRIX BLOOD GLUCOSE TEST test strip USE TO TEST BLOOD GLUCOSE TWICE DAILY.  .Marland KitchenTRUEPLUS LANCETS 33G MISC USE TO CHECK BLOOD SUGAR DAILY AND PRN  . Vitamin D, Ergocalciferol, (DRISDOL) 1.25 MG (50000 UNIT) CAPS capsule Take 1 capsule  (50,000 Units total) by mouth every 7 (seven) days.  .Marland Kitchenketoconazole (NIZORAL) 2 % cream Apply 1 fingertip amount to each foot twice daily. (Patient not taking: Reported on 03/31/2020)   No facility-administered encounter medications on file as of 03/31/2020.    Allergies (verified) Drug ingredient [atorvastatin]   History: Past Medical History:  Diagnosis Date  . Diabetes mellitus   . Hypertension   . OSA (obstructive sleep apnea) 11/24/2016   Past Surgical History:  Procedure Laterality Date  . CATARACT EXTRACTION, BILATERAL Bilateral 2019  . COLONOSCOPY  07/06/2011   Procedure: COLONOSCOPY;  Surgeon: JWinfield Cunas, MD;  [facility];  Service: Endoscopy;  Laterality: N/A;  . ESOPHAGOGASTRODUODENOSCOPY  07/05/2011   Procedure: ESOPHAGOGASTRODUODENOSCOPY (EGD);  Surgeon: WLandry Dyke MD;  [facility];  Service: Endoscopy;  Laterality: N/A;   Family History  Problem Relation Age of Onset  . Brain cancer Mother   . Hypertension Father   . Diabetes Father   . Stroke Father    Social History   Socioeconomic History  . Marital status: Married    Spouse name: Not on file  . Number of children: Not on file  . Years of education: Not on file  . Highest education level: Not on file  Occupational History  . Occupation: retired  Tobacco Use  . Smoking status: Former Smoker    Packs/day: 1.00    Years: 30.00    Pack years: 30.00    Types: Cigarettes    Quit date: 07/03/2001    Years since quitting: 18.7  . Smokeless tobacco: Former USystems developer  Types: Sarina Ser    Quit date: 07/04/1991  Vaping Use  . Vaping Use: Never used  Substance and Sexual Activity  . Alcohol use: Yes    Alcohol/week: 3.0 standard drinks    Types: 3 Glasses of wine per week    Comment: daily alcohol wine with dinner  . Drug use: No  . Sexual activity: Yes  Other Topics Concern  . Not on file  Social History Narrative   Retired - Management consultant plants    Married       He  likes to play golf. He likes to read and work in the yard.       Social Determinants of Health   Financial Resource Strain: Low Risk   . Difficulty of Paying Living Expenses: Not hard at all  Food Insecurity: No Food Insecurity  . Worried About Charity fundraiser in the Last Year: Never true  . Ran Out of Food in the Last Year: Never true  Transportation Needs: No Transportation Needs  . Lack of Transportation (Medical): No  . Lack of Transportation (Non-Medical): No  Physical Activity: Insufficiently Active  . Days of Exercise per Week: 3 days  . Minutes of Exercise per Session: 30 min  Stress: No Stress Concern Present  . Feeling of Stress : Not at all  Social Connections: Moderately Isolated  . Frequency of Communication with Friends and Family: More than three times a week  . Frequency of Social Gatherings with Friends and Family: More than three times a week  . Attends Religious Services: Never  . Active Member of Clubs or Organizations: No  . Attends Archivist Meetings: Never  . Marital Status: Married    Tobacco Counseling Counseling given: Not Answered   Clinical Intake:  Pre-visit preparation completed: Yes  Pain : No/denies pain     Nutritional Risks: None Diabetes: Yes (Patient states checks glucose once a week) CBG done?: No Did pt. bring in CBG monitor from home?: No  How often do you need to have someone help you when you read instructions, pamphlets, or other written materials from your doctor or pharmacy?: 1 - Never What is the last grade level you completed in school?: College  Diabetic?Yes  Interpreter Needed?: No  Information entered by :: [place] of Daily Living In your present state of health, do you have any difficulty performing the following activities: 03/31/2020  Hearing? Y  Comment Has difficulties hearing tv  Vision? N  Difficulty concentrating or making decisions? N  Walking or climbing stairs? N    Dressing or bathing? N  Doing errands, shopping? N  Preparing Food and eating ? N  Using the Toilet? N  In the past six months, have you accidently leaked urine? Y  Comment Has occassional bladder leakage  Do you have problems with loss of bowel control? Y  Comment has had a few incidents of loss bowel control  Managing your Medications? N  Managing your Finances? N  Housekeeping or managing your Housekeeping? N  Some recent data might be hidden    Patient Care Team: Dorothyann Peng, NP as PCP - General (Family Medicine) Werner Lean, MD as PCP - Cardiology (Cardiology)  Indicate any recent Medical Services you may have received from other than [facility] providers in the past year (date may be approximate).     Assessment:   This is a routine wellness examination for Randy [name].  Hearing/Vision screen  Hearing Screening   125Hz  250Hz   500Hz  1000Hz  2000Hz  3000Hz  4000Hz  6000Hz  8000Hz   Right ear:           Left ear:           Vision Screening Comments: Patient states gets eyes checked annually. Had cataracts removed in 2020  Dietary issues and exercise activities discussed: Current Exercise Habits: Home exercise routine, Type of exercise: walking;stretching, Time (Minutes): 30, Frequency (Times/Week): 3, Weekly Exercise (Minutes/Week): 90, Intensity: Mild  Goals    . Exercise 3x per week (30 min per time)      Depression Screen PHQ 2/9 Scores 03/31/2020 12/07/2018 11/07/2017 05/08/2015 12/19/2013  PHQ - 2 Score 2 0 0 0 0  PHQ- 9 Score 8 - - - -    Fall Risk Fall Risk  03/31/2020 12/07/2018 11/07/2017 05/08/2015 12/19/2013  Falls in the past year? 0 1 No Yes Yes  Number falls in past yr: 0 0 - 2 or more 1  Injury with Fall? 0 0 - No No  Risk Factor Category  - - - High Fall Risk -  Risk for fall due to : History of fall(s) Impaired balance/gait - Impaired balance/gait Impaired balance/gait  Follow up Falls evaluation completed;Falls prevention discussed Falls evaluation  completed;Education provided;Falls prevention discussed - - -    Any stairs in or around the home? Yes  If so, are there any without handrails? No  Home free of loose throw rugs in walkways, pet beds, electrical cords, etc? Yes  Adequate lighting in your home to reduce risk of falls? Yes   ASSISTIVE DEVICES UTILIZED TO PREVENT FALLS:  Life alert? No  Use of a cane, walker or w/c? No  Grab bars in the bathroom? No  Shower chair or bench in shower? Yes  Elevated toilet seat or a handicapped toilet? No     Cognitive Function: MMSE - Mini Mental State Exam 11/21/2019  Orientation to time 5  Orientation to Place 5  Registration 3  Attention/ Calculation 5  Recall 1  Language- name 2 objects 2  Language- repeat 1  Language- follow 3 step command 3  Language- read & follow direction 1  Write a sentence 1  Copy design 1  Total score 28     6CIT Screen 03/31/2020  What Year? 0 points  What month? 0 points  What time? 0 points  Count back from 20 0 points  Months in reverse 0 points  Repeat phrase 0 points  Total Score 0    Immunizations Immunization History  Administered Date(s) Administered  . Fluad Quad(high Dose 65+) 04/30/2019  . Influenza, High Dose Seasonal PF 04/11/2013, 05/08/2015, 04/20/2016, 04/12/2017, 04/10/2018  . Influenza,inj,Quad PF,6+ Mos 05/01/2014  . PFIZER SARS-COV-2 Vaccination 07/25/2019, 08/15/2019  . Pneumococcal Conjugate-13 12/19/2013  . Pneumococcal Polysaccharide-23 04/22/2005, 05/08/2015  . Tdap 04/11/2013    TDAP status: Up to date Flu Vaccine status: Up to date Pneumococcal vaccine status: Up to date Covid-19 vaccine status: Completed vaccines  Qualifies for Shingles Vaccine? Yes   Zostavax completed No   Shingrix Completed?: No.    Education has been provided regarding the importance of this vaccine. Patient has been advised to call insurance company to determine out of pocket expense if they have not yet received this vaccine.  Advised may also receive vaccine at local pharmacy or Health Dept. Verbalized acceptance and understanding.  Screening Tests Health Maintenance  Topic Date Due  . Hepatitis C Screening  Never done  . OPHTHALMOLOGY EXAM  08/14/2018  . FOOT EXAM  12/07/2019  .  INFLUENZA VACCINE  02/02/2020  . HEMOGLOBIN A1C  05/23/2020  . TETANUS/TDAP  04/12/2023  . COVID-19 Vaccine  Completed  . PNA vac Low Risk Adult  Completed    Health Maintenance  Health Maintenance Due  Topic Date Due  . Hepatitis C Screening  Never done  . OPHTHALMOLOGY EXAM  08/14/2018  . FOOT EXAM  12/07/2019  . INFLUENZA VACCINE  02/02/2020    Colorectal cancer screening: No longer required.   Lung Cancer Screening: (Low Dose CT Chest recommended if Age 57-80 years, 30 pack-year currently smoking OR have quit w/in 15years.) does not qualify.   Lung Cancer Screening Referral: N/A  Additional Screening:  Hepatitis C Screening: does not qualify;   Vision Screening: Recommended annual ophthalmology exams for early detection of glaucoma and other disorders of the eye. Is the patient up to date with their annual eye exam?  Yes  Who is the provider or what is the name of the office in which the patient attends annual eye exams? [facility] If pt is not established with a provider, would they like to be referred to a provider to establish care? No .   Dental Screening: Recommended annual dental exams for proper oral hygiene  Community Resource Referral / Chronic Care Management: CRR required this visit?  No   CCM required this visit?  No      Plan:     I have personally reviewed and noted the following in the patient's chart:   . Medical and social history . Use of alcohol, tobacco or illicit drugs  . Current medications and supplements . Functional ability and status . Nutritional status . Physical activity . Advanced directives . List of other physicians . Hospitalizations, surgeries, and ER visits in  previous 12 months . Vitals . Screenings to include cognitive, depression, and falls . Referrals and appointments  In addition, I have reviewed and discussed with patient certain preventive protocols, quality metrics, and best practice recommendations. A written personalized care plan for preventive services as well as general preventive health recommendations were provided to patient.     Ofilia Neas, LPN   01/22/5871   Nurse Notes: Patient reports significant fatigue

## 2020-04-03 ENCOUNTER — Encounter: Payer: Self-pay | Admitting: Adult Health

## 2020-04-07 ENCOUNTER — Telehealth (INDEPENDENT_AMBULATORY_CARE_PROVIDER_SITE_OTHER): Payer: Medicare PPO | Admitting: Adult Health

## 2020-04-07 ENCOUNTER — Encounter: Payer: Self-pay | Admitting: Adult Health

## 2020-04-07 ENCOUNTER — Other Ambulatory Visit: Payer: Self-pay

## 2020-04-07 DIAGNOSIS — R5383 Other fatigue: Secondary | ICD-10-CM | POA: Diagnosis not present

## 2020-04-07 MED ORDER — BUPROPION HCL ER (XL) 150 MG PO TB24: 150.0000 mg | ORAL_TABLET | Freq: Every day | ORAL | 1 refills | Status: DC

## 2020-04-07 NOTE — Progress Notes (Signed)
Virtual Visit via Telephone Note  I connected with Randy [name] on 04/07/20 at  5:00 PM EDT by telephone and verified that I am speaking with the correct person using two identifiers.   I discussed the limitations, risks, security and privacy concerns of performing an evaluation and management service by telephone and the availability of in person appointments. I also discussed with the patient that there may be a patient responsible charge related to this service. The patient expressed understanding and agreed to proceed.  Location patient: home Location provider: work or home office Participants present for the call: patient, provider Patient did not have a visit in the prior 7 days to address this/these issue(s).   History of Present Illness: 79 year old male who  has a past medical history of Diabetes mellitus, Hypertension, and OSA (obstructive sleep apnea) (11/24/2016).  Evaluated today for continued chronic fatigue.  He has had chronic fatigue for the last year.  Was thought originally to be due to vitamin B12 deficiency and he was supplemented with IM injections.  His vitamin B12 levels came up but he continued to have chronic fatigue.  He has been seen by his cardiologist recently which recommended exercise nuclear stress test.  He had is a friend who had the same symptoms and was diagnosed with depression.  He is wondering if he may have some depressive symptoms.  His current symptoms include no energy, lack of stamina, fatigue, loss of concentration, lack of confidence.  He does not necessarily feel depressed but is not happy living the way he is living.  He denies any SI or HI.    Objective:     Observations/Objective: Patient sounds cheerful and well on the phone. I do not appreciate any SOB. Speech and thought processing are grossly intact. Patient reported vitals:  Assessment and Plan: 1. Other fatigue PHQ9 score = 9. Mild depression. He is ok with trying Wellbutrin  to see if his symptoms improve over the next 30 days.  - buPROPion (WELLBUTRIN XL) 150 MG 24 hr tablet; Take 1 tablet (150 mg total) by mouth daily.  Dispense: 30 tablet; Refill: 1   Follow Up Instructions:  I did not refer this patient for an OV in the next 24 hours for this/these issue(s).  I discussed the assessment and treatment plan with the patient. The patient was provided an opportunity to ask questions and all were answered. The patient agreed with the plan and demonstrated an understanding of the instructions.   The patient was advised to call back or seek an in-person evaluation if the symptoms worsen or if the condition fails to improve as anticipated.  I provided 18  minutes of non-face-to-face time during this encounter.   Dorothyann Peng, NP

## 2020-04-13 ENCOUNTER — Telehealth: Payer: Self-pay | Admitting: Internal Medicine

## 2020-04-13 NOTE — Telephone Encounter (Signed)
Randy [name] is calling stating he received a call from our office this morning requesting he callback, but I was unable to find documentation as to what it was in regards to. Please advise.

## 2020-04-13 NOTE — Telephone Encounter (Signed)
Lm to call back after reviewing chart did not see where we had called pt May have been an old message .Adonis Housekeeper

## 2020-04-15 DIAGNOSIS — H35033 Hypertensive retinopathy, bilateral: Secondary | ICD-10-CM | POA: Diagnosis not present

## 2020-04-15 DIAGNOSIS — E119 Type 2 diabetes mellitus without complications: Secondary | ICD-10-CM | POA: Diagnosis not present

## 2020-04-15 LAB — HM DIABETES EYE EXAM

## 2020-04-18 ENCOUNTER — Ambulatory Visit: Payer: Medicare PPO

## 2020-04-22 ENCOUNTER — Encounter: Payer: Self-pay | Admitting: Adult Health

## 2020-04-30 DIAGNOSIS — G4733 Obstructive sleep apnea (adult) (pediatric): Secondary | ICD-10-CM | POA: Diagnosis not present

## 2020-05-16 ENCOUNTER — Ambulatory Visit: Payer: Medicare PPO | Attending: Internal Medicine

## 2020-05-16 ENCOUNTER — Other Ambulatory Visit: Payer: Self-pay

## 2020-05-16 DIAGNOSIS — Z23 Encounter for immunization: Secondary | ICD-10-CM

## 2020-05-16 NOTE — Progress Notes (Signed)
   Covid-19 Vaccination Clinic  Name:  Randy [name]    MRN: [id number] DOB: Apr 11, 1941  05/16/2020  [name] was observed post Covid-19 immunization for 15 minutes without incident. He was provided with Vaccine Information Sheet and instruction to access the V-Safe system.   [name] was instructed to call 911 with any severe reactions post vaccine: Marland Kitchen Difficulty breathing  . Swelling of face and throat  . A fast heartbeat  . A bad rash all over body  . Dizziness and weakness

## 2020-05-19 ENCOUNTER — Ambulatory Visit (INDEPENDENT_AMBULATORY_CARE_PROVIDER_SITE_OTHER): Payer: Medicare PPO | Admitting: *Deleted

## 2020-05-19 ENCOUNTER — Other Ambulatory Visit: Payer: Self-pay

## 2020-05-19 DIAGNOSIS — Z23 Encounter for immunization: Secondary | ICD-10-CM

## 2020-06-09 ENCOUNTER — Other Ambulatory Visit: Payer: Self-pay | Admitting: Adult Health

## 2020-06-09 DIAGNOSIS — R5383 Other fatigue: Secondary | ICD-10-CM

## 2020-07-17 DIAGNOSIS — G4733 Obstructive sleep apnea (adult) (pediatric): Secondary | ICD-10-CM | POA: Diagnosis not present

## 2020-08-07 ENCOUNTER — Encounter: Payer: Self-pay | Admitting: Adult Health

## 2020-08-11 ENCOUNTER — Other Ambulatory Visit: Payer: Self-pay

## 2020-08-12 ENCOUNTER — Other Ambulatory Visit: Payer: Self-pay

## 2020-08-12 ENCOUNTER — Encounter: Payer: Self-pay | Admitting: Adult Health

## 2020-08-12 ENCOUNTER — Ambulatory Visit: Payer: Medicare PPO | Admitting: Adult Health

## 2020-08-12 VITALS — BP 138/52 | Temp 97.6°F | Wt 174.0 lb

## 2020-08-12 DIAGNOSIS — R0600 Dyspnea, unspecified: Secondary | ICD-10-CM

## 2020-08-12 DIAGNOSIS — R600 Localized edema: Secondary | ICD-10-CM | POA: Diagnosis not present

## 2020-08-12 DIAGNOSIS — H9190 Unspecified hearing loss, unspecified ear: Secondary | ICD-10-CM | POA: Diagnosis not present

## 2020-08-12 DIAGNOSIS — E538 Deficiency of other specified B group vitamins: Secondary | ICD-10-CM

## 2020-08-12 DIAGNOSIS — R5383 Other fatigue: Secondary | ICD-10-CM | POA: Diagnosis not present

## 2020-08-12 DIAGNOSIS — F339 Major depressive disorder, recurrent, unspecified: Secondary | ICD-10-CM

## 2020-08-12 DIAGNOSIS — R2681 Unsteadiness on feet: Secondary | ICD-10-CM

## 2020-08-12 LAB — VITAMIN B12: Vitamin B-12: 845 pg/mL (ref 211–911)

## 2020-08-12 MED ORDER — CITALOPRAM HYDROBROMIDE 10 MG PO TABS: 10.0000 mg | ORAL_TABLET | Freq: Every day | ORAL | 0 refills | Status: DC

## 2020-08-12 NOTE — Progress Notes (Signed)
Subjective:    Patient ID: Randy [name], male    DOB: [birth year], 80 y.o.   MRN: [id number]  HPI  80 year old male who  has a past medical history of Diabetes mellitus, Hypertension, and OSA (obstructive sleep apnea) (11/24/2016).  He presents to the office today for follow up. His wife is with him today to help supplement history   Generalized weakness and gait instability -has been an ongoing issue on and off over the last few years. Back in June 2020 he was started at physical therapy for roughly 6 weeks. After physical therapy he was able to walk approximately 2 miles without much difficulty. August 2020 he was diagnosed with UTI with sepsis and hospital admission  prolonged antibiotic use.  since that time he admits to being pretty sedentary. Feels as though it is difficult for him to walk even a half a mile anymore without becoming fatigued. He also reports low back pain at times when he is walking.September he was evaluated by cardiology and had a normal exercise stress test.  Has been diagnosed with B12 deficiency and received injections and then transitioned over to sublingual over-the-counter supplementation once his B12 levels corrected. He continues with a sublingual B12  His wife reports that in the past he would keep their yard "immaculate" would enjoy working outside. No he sleeps throughout most of the day, when he is up he is usually watching TV on the couch or taking a nap during the afternoon. There has been some discussion of depression in the past and he was started on Wellbutrin but stopped taking this an unknown time ago. His wife reports today that when he was taking the Wellbutrin he seemed to have less depressive symptoms and his mood was better. Patient did not notice any improvement while on Wellbutrin.  Additionally, he would like to be referred for hearing evaluation. He has to turn the TV up loud to hear it and will often have to use close captioning to know what  they are saying.  Review of Systems  Constitutional: Positive for fatigue.  Respiratory: Negative.   Cardiovascular: Positive for leg swelling.  Musculoskeletal: Positive for arthralgias, back pain, gait problem and myalgias.  Skin: Negative.   Neurological: Positive for tremors and weakness. Negative for dizziness, speech difficulty and light-headedness.  Psychiatric/Behavioral: Positive for decreased concentration. Negative for sleep disturbance and suicidal ideas.   See HPI   Past Medical History:  Diagnosis Date  . Diabetes mellitus   . Hypertension   . OSA (obstructive sleep apnea) 11/24/2016    Social History   Socioeconomic History  . Marital status: Married    Spouse name: Not on file  . Number of children: Not on file  . Years of education: Not on file  . Highest education level: Not on file  Occupational History  . Occupation: retired  Tobacco Use  . Smoking status: Former Smoker    Packs/day: 1.00    Years: 30.00    Pack years: 30.00    Types: Cigarettes    Quit date: 07/03/2001    Years since quitting: 19.1  . Smokeless tobacco: Former Systems developer    Types: [place] date: 07/04/1991  Vaping Use  . Vaping Use: Never used  Substance and Sexual Activity  . Alcohol use: Yes    Alcohol/week: 3.0 standard drinks    Types: 3 Glasses of wine per week    Comment: daily alcohol wine with dinner  . Drug  use: No  . Sexual activity: Yes  Other Topics Concern  . Not on file  Social History Narrative   Retired - Management consultant plants    Married       He likes to play golf. He likes to read and work in the yard.       Social Determinants of Health   Financial Resource Strain: Low Risk   . Difficulty of Paying Living Expenses: Not hard at all  Food Insecurity: No Food Insecurity  . Worried About Charity fundraiser in the Last Year: Never true  . Ran Out of Food in the Last Year: Never true  Transportation Needs: No Transportation Needs  . Lack of  Transportation (Medical): No  . Lack of Transportation (Non-Medical): No  Physical Activity: Insufficiently Active  . Days of Exercise per Week: 3 days  . Minutes of Exercise per Session: 30 min  Stress: No Stress Concern Present  . Feeling of Stress : Not at all  Social Connections: Moderately Isolated  . Frequency of Communication with Friends and Family: More than three times a week  . Frequency of Social Gatherings with Friends and Family: More than three times a week  . Attends Religious Services: Never  . Active Member of Clubs or Organizations: No  . Attends Archivist Meetings: Never  . Marital Status: Married  Human resources officer Violence: Not At Risk  . Fear of Current or Ex-Partner: No  . Emotionally Abused: No  . Physically Abused: No  . Sexually Abused: No    Past Surgical History:  Procedure Laterality Date  . CATARACT EXTRACTION, BILATERAL Bilateral 2019  . COLONOSCOPY  07/06/2011   Procedure: COLONOSCOPY;  Surgeon: Winfield Cunas., MD;  Location: [facility] ENDOSCOPY;  Service: Endoscopy;  Laterality: N/A;  . ESOPHAGOGASTRODUODENOSCOPY  07/05/2011   Procedure: ESOPHAGOGASTRODUODENOSCOPY (EGD);  Surgeon: Landry Dyke, MD;  Location: [facility] ENDOSCOPY;  Service: Endoscopy;  Laterality: N/A;    Family History  Problem Relation Age of Onset  . Brain cancer Mother   . Hypertension Father   . Diabetes Father   . Stroke Father     Allergies  Allergen Reactions  . Drug Ingredient [Atorvastatin]     Myalgia    Current Outpatient Medications on File Prior to Visit  Medication Sig Dispense Refill  . Blood Glucose Calibration (TRUE METRIX LEVEL 1) Low SOLN USE TO CHECK METER PRIOR TO TESTING SUGAR 3 each 0  . Blood Glucose Monitoring Suppl (TRUE METRIX AIR GLUCOSE METER) w/Device KIT 1 each by Does not apply route daily. 1 kit 0  . Cyanocobalamin (VITAMIN B-12 PO) Take 5,000 mcg by mouth daily.    Marland Kitchen ketoconazole (NIZORAL) 2 % cream Apply 1 fingertip amount to each  foot twice daily. 30 g 0  . lisinopril-hydrochlorothiazide (ZESTORETIC) 20-12.5 MG tablet TAKE ONE TABLET BY MOUTH DAILY 90 tablet 0  . metFORMIN (GLUCOPHAGE) 1000 MG tablet TAKE ONE TABLET BY MOUTH DAILY WITH BREAKFAST 90 tablet 0  . TRUE METRIX BLOOD GLUCOSE TEST test strip USE TO TEST BLOOD GLUCOSE TWICE DAILY. 200 each 3  . TRUEPLUS LANCETS 33G MISC USE TO CHECK BLOOD SUGAR DAILY AND PRN 300 each 0  . Vitamin D, Ergocalciferol, (DRISDOL) 1.25 MG (50000 UNIT) CAPS capsule Take 1 capsule (50,000 Units total) by mouth every 7 (seven) days. 12 capsule 1   No current facility-administered medications on file prior to visit.    BP (!) 138/52   Temp 97.6 F (36.4 C)  Wt 174 lb (78.9 kg)   BMI 26.46 kg/m       Objective:   Physical Exam Vitals and nursing note reviewed.  Constitutional:      Appearance: Normal appearance.  Cardiovascular:     Rate and Rhythm: Normal rate and regular rhythm.     Pulses: Normal pulses.     Heart sounds: Normal heart sounds.  Pulmonary:     Effort: Pulmonary effort is normal.     Breath sounds: Normal breath sounds.  Abdominal:     General: Abdomen is flat.     Palpations: Abdomen is soft.  Musculoskeletal:        General: Normal range of motion.     Right lower leg: 1+ Pitting Edema present.     Left lower leg: 1+ Pitting Edema present.  Skin:    General: Skin is warm and dry.  Neurological:     General: No focal deficit present.     Mental Status: He is alert and oriented to person, place, and time.  Psychiatric:        Attention and Perception: Attention and perception normal.        Mood and Affect: Mood normal.        Speech: Speech normal.        Behavior: Behavior normal.        Thought Content: Thought content normal.        Cognition and Memory: Cognition and memory normal.        Judgment: Judgment normal.        Assessment & Plan:  1. Gait instability -Think most of his symptoms are multifactorial due to physical  deconditioning from sedentary lifestyle as well as a degree of depression. He has done very well with physical therapy in the past and will refer him back there. He was encouraged to become more active, join a gym, long trips, etc. We will also place him on Celexa 10 mg to see if we can get his depression under better control than what it was with Wellbutrin. We will see him back in 30 days or sooner if needed. - Ambulatory referral to Physical Therapy  2. B12 deficiency  - Vitamin B12; Future - Vitamin B12  3. Other fatigue  - EKG 12-Lead- Sinus Rhythm, Rate 82 - Ambulatory referral to Physical Therapy  4. Lower extremity edema   5. Depression, recurrent ([place])  - citalopram (CELEXA) 10 MG tablet; Take 1 tablet (10 mg total) by mouth daily.  Dispense: 30 tablet; Refill: 0  6. Hearing loss, unspecified hearing loss type, unspecified laterality  - Ambulatory referral to ENT  [facility]

## 2020-08-12 NOTE — Patient Instructions (Addendum)
I am going to start you on a new anti depressant called Celexa   I am also going to refer you to PT   I am going to send you to ENT for a hearing evaluation   Please work on being more active   Follow up in one month

## 2020-08-14 ENCOUNTER — Encounter: Payer: Self-pay | Admitting: Adult Health

## 2020-09-02 ENCOUNTER — Other Ambulatory Visit: Payer: Self-pay

## 2020-09-02 ENCOUNTER — Ambulatory Visit (INDEPENDENT_AMBULATORY_CARE_PROVIDER_SITE_OTHER): Payer: Medicare PPO | Admitting: Otolaryngology

## 2020-09-02 ENCOUNTER — Encounter (INDEPENDENT_AMBULATORY_CARE_PROVIDER_SITE_OTHER): Payer: Self-pay | Admitting: Otolaryngology

## 2020-09-02 VITALS — Temp 97.5°F

## 2020-09-02 DIAGNOSIS — H903 Sensorineural hearing loss, bilateral: Secondary | ICD-10-CM

## 2020-09-02 DIAGNOSIS — H6123 Impacted cerumen, bilateral: Secondary | ICD-10-CM | POA: Diagnosis not present

## 2020-09-02 NOTE — Progress Notes (Signed)
HPI: Randy [name] is a 80 y.o. male who presents is referred by his PCP for evaluation of his hearing at the request of his wife who has complained about his hearing.  Past Medical History:  Diagnosis Date   Diabetes mellitus    Hypertension    OSA (obstructive sleep apnea) 11/24/2016   Past Surgical History:  Procedure Laterality Date   CATARACT EXTRACTION, BILATERAL Bilateral 2019   COLONOSCOPY  07/06/2011   Procedure: COLONOSCOPY;  Surgeon: Winfield Cunas., MD;  Location: [facility] ENDOSCOPY;  Service: Endoscopy;  Laterality: N/A;   ESOPHAGOGASTRODUODENOSCOPY  07/05/2011   Procedure: ESOPHAGOGASTRODUODENOSCOPY (EGD);  Surgeon: Landry Dyke, MD;  Location: [facility] ENDOSCOPY;  Service: Endoscopy;  Laterality: N/A;   Social History   Socioeconomic History   Marital status: Married    Spouse name: Not on file   Number of children: Not on file   Years of education: Not on file   Highest education level: Not on file  Occupational History   Occupation: retired  Tobacco Use   Smoking status: Former Smoker    Packs/day: 1.00    Years: 30.00    Pack years: 30.00    Types: Cigarettes    Quit date: 07/03/2001    Years since quitting: 19.1   Smokeless tobacco: Former Systems developer    Types: Chew    Quit date: 07/04/1991  Vaping Use   Vaping Use: Never used  Substance and Sexual Activity   Alcohol use: Yes    Alcohol/week: 3.0 standard drinks    Types: 3 Glasses of wine per week    Comment: daily alcohol wine with dinner   Drug use: No   Sexual activity: Yes  Other Topics Concern   Not on file  Social History Narrative   Retired - managed textile plants    Married       He likes to play golf. He likes to read and work in the yard.       Social Determinants of Health   Financial Resource Strain: Low Risk    Difficulty of Paying Living Expenses: Not hard at all  Food Insecurity: No Food Insecurity   Worried About Charity fundraiser in the Last Year: Never true    [place] in the Last Year: Never true  Transportation Needs: No Transportation Needs   Lack of Transportation (Medical): No   Lack of Transportation (Non-Medical): No  Physical Activity: Insufficiently Active   Days of Exercise per Week: 3 days   Minutes of Exercise per Session: 30 min  Stress: No Stress Concern Present   Feeling of Stress : Not at all  Social Connections: Moderately Isolated   Frequency of Communication with Friends and Family: More than three times a week   Frequency of Social Gatherings with Friends and Family: More than three times a week   Attends Religious Services: Never   Marine scientist or Organizations: No   Attends Music therapist: Never   Marital Status: Married   Family History  Problem Relation Age of Onset   Brain cancer Mother    Hypertension Father    Diabetes Father    Stroke Father    Allergies  Allergen Reactions   Drug Ingredient [Atorvastatin]     Myalgia   Prior to Admission medications   Medication Sig Start Date End Date Taking? Authorizing Provider  Blood Glucose Calibration (TRUE METRIX LEVEL 1) Low SOLN USE TO CHECK METER PRIOR TO TESTING  SUGAR 11/09/17   Marletta Lor, MD  Blood Glucose Monitoring Suppl (TRUE METRIX AIR GLUCOSE METER) w/Device KIT 1 each by Does not apply route daily. 11/09/17   Marletta Lor, MD  citalopram (CELEXA) 10 MG tablet Take 1 tablet (10 mg total) by mouth daily. 08/12/20   Nafziger, Tommi Rumps, NP  Cyanocobalamin (VITAMIN B-12 PO) Take 5,000 mcg by mouth daily.    [provider]  lisinopril-hydrochlorothiazide (ZESTORETIC) 20-12.5 MG tablet TAKE ONE TABLET BY MOUTH DAILY 06/09/20   Nafziger, Tommi Rumps, NP  metFORMIN (GLUCOPHAGE) 1000 MG tablet TAKE ONE TABLET BY MOUTH DAILY WITH BREAKFAST 06/09/20   Nafziger, Tommi Rumps, NP  TRUE METRIX BLOOD GLUCOSE TEST test strip USE TO TEST BLOOD GLUCOSE TWICE DAILY. 06/06/19   Nafziger, Tommi Rumps, NP  TRUEPLUS LANCETS 33G MISC  USE TO CHECK BLOOD SUGAR DAILY AND PRN 11/09/17   Marletta Lor, MD  Vitamin D, Ergocalciferol, (DRISDOL) 1.25 MG (50000 UNIT) CAPS capsule Take 1 capsule (50,000 Units total) by mouth every 7 (seven) days. 11/22/19   Nafziger, Tommi Rumps, NP     Positive ROS: Otherwise negative  All other systems have been reviewed and were otherwise negative with the exception of those mentioned in the HPI and as above.  Physical Exam: Constitutional: Alert, well-appearing, no acute distress Ears: External ears without lesions or tenderness.  He had a mild amount of wax buildup in both ear canals that was cleaned with forceps and curettes.  TMs were clear bilaterally. Nasal: External nose without lesions.. Clear nasal passages Oral: Lips and gums without lesions. Tongue and palate mucosa without lesions. Posterior oropharynx clear. Neck: No palpable adenopathy or masses Respiratory: Breathing comfortably  Skin: No facial/neck lesions or rash noted.  Cerumen impaction removal  Date/Time: 09/02/2020 1:04 PM Performed by: Rozetta Nunnery, MD Authorized by: Rozetta Nunnery, MD   Consent:    Consent obtained:  Verbal   Consent given by:  Patient   Risks discussed:  Pain and bleeding Procedure details:    Location:  L ear and R ear   Procedure type: curette and forceps   Post-procedure details:    Inspection:  TM intact and canal normal   Hearing quality:  Improved   Patient tolerance of procedure:  Tolerated well, no immediate complications Comments:     TMs are clear bilaterally.  Audiologic testing demonstrated moderate downsloping sensorineural hearing loss in both ears which was symmetric.  He heard okay in the lower frequencies but had downsloping sensorineural hearing loss to 60 dB at 4000 frequency.  SRT's were 35 dB bilaterally with type A tympanograms bilaterally.  Assessment: Moderate bilateral sensorineural hearing loss  Plan: Discussed with patient that I would proceed  with obtaining hearing aids.  That would be the only treatment option for his hearing loss.   Radene Journey, MD   CC:

## 2020-09-03 ENCOUNTER — Encounter (INDEPENDENT_AMBULATORY_CARE_PROVIDER_SITE_OTHER): Payer: Self-pay

## 2020-09-10 ENCOUNTER — Other Ambulatory Visit: Payer: Self-pay | Admitting: Adult Health

## 2020-09-10 DIAGNOSIS — F339 Major depressive disorder, recurrent, unspecified: Secondary | ICD-10-CM

## 2020-09-15 ENCOUNTER — Encounter: Payer: Self-pay | Admitting: Physical Therapy

## 2020-09-15 ENCOUNTER — Ambulatory Visit: Payer: Medicare PPO | Attending: Adult Health | Admitting: Physical Therapy

## 2020-09-15 ENCOUNTER — Other Ambulatory Visit: Payer: Self-pay

## 2020-09-15 DIAGNOSIS — R262 Difficulty in walking, not elsewhere classified: Secondary | ICD-10-CM

## 2020-09-15 DIAGNOSIS — M6281 Muscle weakness (generalized): Secondary | ICD-10-CM | POA: Diagnosis not present

## 2020-09-15 NOTE — Patient Instructions (Signed)
Access Code: [email or web address] URL: [email or web address] Date: 09/15/2020 Prepared by: Almyra Free  Exercises Sit to Stand without Arm Support - 2 x daily - 7 x weekly - 10 reps - 2 sets Standing Hip Abduction - 2 x daily - 7 x weekly - 10 reps - 2 sets Seated Correct Posture Seated Hamstring Stretch - 3 x daily - 7 x weekly - 3 reps - 20 hold Forward Step Up - 2 x daily - 7 x weekly - 10 reps - 2 sets - 10 hold Single Leg Stance - 2 x daily - 7 x weekly - 10 reps - 10 hold Seated Shoulder Horizontal Abduction with Resistance - 2 x daily - 7 x weekly - 10 reps - 2 sets Tandem Stance with Support - 2 x daily - 7 x weekly - 5 reps - 10 hold Standing Gastroc Stretch at Counter - 2 x daily - 7 x weekly - 3 reps - 1 sets - 30 sec hold Sideways Step Touch - 1 x daily - 7 x weekly - 2 sets - 10 reps Supine Bridge - 2 x daily - 7 x weekly - 1-3 sets - 10 reps

## 2020-09-15 NOTE — Therapy (Signed)
[facility] [address] [place], [place], [address] Phone: [phone]   Fax:  [phone]  Physical Therapy Treatment  Patient Details  Name: Randy [name] MRN: [id number] Date of Birth: 1941/02/27 Referring Provider (PT): Dorothyann Peng NP   Encounter Date: 09/15/2020   PT End of Session - 09/15/20 0934    Visit Number 1    Number of Visits 4    Date for PT Re-Evaluation 11/10/20    Authorization Type [employer] MCR    Authorization Time Period 09/15/20 to 11/10/20    Authorization - Visit Number 1    Authorization - Number of Visits 4    PT Start Time 0935    PT Stop Time 1015    PT Time Calculation (min) 40 min    Activity Tolerance Patient tolerated treatment well    Behavior During Therapy [facility] for tasks assessed/performed           Past Medical History:  Diagnosis Date  . Diabetes mellitus   . Hypertension   . OSA (obstructive sleep apnea) 11/24/2016    Past Surgical History:  Procedure Laterality Date  . CATARACT EXTRACTION, BILATERAL Bilateral 2019  . COLONOSCOPY  07/06/2011   Procedure: COLONOSCOPY;  Surgeon: Winfield Cunas., MD;  Location: [facility] ENDOSCOPY;  Service: Endoscopy;  Laterality: N/A;  . ESOPHAGOGASTRODUODENOSCOPY  07/05/2011   Procedure: ESOPHAGOGASTRODUODENOSCOPY (EGD);  Surgeon: Landry Dyke, MD;  Location: [facility] ENDOSCOPY;  Service: Endoscopy;  Laterality: N/A;    There were no vitals filed for this visit.   Subjective Assessment - 09/15/20 0939    Subjective Patient was sick and got to where he couldn't walk. He is now walking 2x as far as he was but still only 1/2 mile. He shuffles around the house. Careful going down the front steps; holds onto side of door. One fall in the past six months. Carrying something through the yard and slipped on the mud.    Pertinent History HTN, DM, OA    Patient Stated Goals Get back to walking 2 miles/day    Currently in Pain? No/denies               [facility] PT Assessment - 09/15/20 0001      Assessment   Medical Diagnosis other fatigue; gait instabiity    Referring Provider (PT) Dorothyann Peng NP    Onset Date/Surgical Date 02/02/20    Hand Dominance Right    Prior Therapy last summer      Precautions   Precautions Fall      Restrictions   Weight Bearing Restrictions No      Balance Screen   Has the patient fallen in the past 6 months Yes    How many times? 1    Has the patient had a decrease in activity level because of a fear of falling?  No    Is the patient reluctant to leave their home because of a fear of falling?  No      Home Environment   Living Environment Private residence    Living Arrangements Spouse/significant other    Type of [place] to enter    Home Layout Two level      Prior Function   Level of Independence Independent    Leisure walking      Functional Tests   Functional tests Single leg stance      Single Leg Stance   Comments Right  10 sec +; Left unable to hold > 1 sec      Posture/Postural Control   Posture Comments right hip ER      ROM / Strength   AROM / PROM / Strength Strength      Strength   Overall Strength Comments Bil LE 4+/5; weak abdominal strength tested in sitting;      Flexibility   Soft Tissue Assessment /Muscle Length yes    Hamstrings bil tightness    Piriformis marked left; right WNL    Quadratus Lumborum bil gastroc tightness left > right      Ambulation/Gait   Ambulation/Gait Yes    Ambulation/Gait Assistance 7: Independent    Ambulation Distance (Feet) 20 Feet    Assistive device None    Gait Pattern Decreased dorsiflexion - left;Wide base of support    Ambulation Surface Level    Stairs Yes    Stairs Assistance 7: Independent    Stair Management Technique One rail Right;Alternating pattern    Number of Stairs 4    Height of Stairs 6    Gait Comments stairs: reciprocal gait with one hand rail; weak eccentric quads on  descent      Standardized Balance Assessment   Standardized Balance Assessment Five Times Sit to Stand    Five times sit to stand comments  9.2 sec      High Level Balance   High Level Balance Comments walking with head turns WNL; able to stand with eyes closed x 10 sec, feet together x 30 sec without difficulty; mild balance deficits with tandem stance                         [facility] Adult PT Treatment/Exercise - 09/15/20 0001      Exercises   Exercises Knee/Hip      Knee/Hip Exercises: Stretches   Gastroc Stretch Both;1 rep;30 seconds      Knee/Hip Exercises: Supine   Bridges 10 reps    Bridges Limitations cues to bring heels closer to hips      Knee/Hip Exercises: Sidelying   Hip ABduction Left;10 reps    Hip ABduction Limitations feels in quads compensating for weak glut med    Clams left 10 reps                  PT Education - 09/15/20 1148    Education Details HEP Progressed (added to previous HEP)    Person(s) Educated Patient    Methods Explanation;Demonstration;Handout    Comprehension Verbalized understanding;Returned demonstration            PT Short Term Goals - 09/15/20 1148      PT SHORT TERM GOAL #1   Title be independent in initial HEP    Time 2    Period Weeks    Status New    Target Date 09/29/20      PT SHORT TERM GOAL #2   Title Improved endurance with walking to 1 mile    Time 4    Period Weeks    Status New    Target Date 10/13/20      PT SHORT TERM GOAL #3   Title --             PT Long Term Goals - 09/15/20 1151      PT LONG TERM GOAL #1   Title be independent in advanced HEP    Time 8    Period Weeks  Status New    Target Date 11/10/20      PT LONG TERM GOAL #2   Title perform single leg stance on Lt x 8 seconds each to improve safety and balance    Time 8    Period Weeks    Status New      PT LONG TERM GOAL #3   Title descend steps with reciprocal gait and good eccentric control to improve  safety on steps at home    Time 8    Period Weeks    Status New      PT LONG TERM GOAL #4   Title Patient to demonstrate improved bil ankle flexibility as evidenced by decreased shuffling with gait at home.    Time 8    Period Weeks    Status New                 Plan - 09/15/20 1120    Clinical Impression Statement Patient is familiar to this clinic completing an episode in July 2021 for gait instability. He subsequently was sick for a few months, stopped doing his HEP and has had difficulty regaining his endurance and strength particularly with walking. He is able to walk 1/2 mile in about 30 min now, but would like to get to 2 miles. He also reports that he is shuffling his feet at home when walking and is having some balance issues with one fall in the past six months. He denies pain today but mentioned that he does get some pain in his back with ambulation. He was unsure of what side but thinks it has to do with his posture during gait. He also uses a step to gait to descend his front steps and holds onto the door for support. Patient has decreased bil LE strength and also has increased piriformis and gastroc/soleus tightness on the left. His left SLS is <=1 sec, Rt 10 sec. He has decreased eccentric quad strength with descending stairs but was able to use a reciprocal gait with one handrail. Patient will benefit from skilled PT to address the above deficits to return him to his PLOF. He has started doing his previous HEP and this was progressed today.    Personal Factors and Comorbidities Comorbidity 3+    Comorbidities HTN, DM, OA    Examination-Activity Limitations Locomotion Level    Stability/Clinical Decision Making Stable/Uncomplicated    Clinical Decision Making Low    Rehab Potential Excellent    PT Frequency Biweekly    PT Duration 8 weeks    PT Treatment/Interventions ADLs/Self Care Home Management;Stair training;Gait training;Therapeutic activities;Therapeutic  exercise;Balance training;Neuromuscular re-education;Patient/family education;Manual techniques;Dry needling    PT Next Visit Plan Pt coming every 2 weeks: Progress HEP for balance, piriformis flexibility, glut med strength, eccentric quad strength    PT Home Exercise Plan [email or web address]    Consulted and Agree with Plan of Care Patient           Patient will benefit from skilled therapeutic intervention in order to improve the following deficits and impairments:  Decreased endurance,Decreased activity tolerance,Decreased balance,Impaired flexibility,Decreased strength,Pain  Visit Diagnosis: Muscle weakness (generalized)  Difficulty in walking, not elsewhere classified     Problem List Patient Active Problem List   Diagnosis Date Noted  . Dyspnea on exertion 03/18/2020  . PVC (premature ventricular contraction) 03/18/2020  . Acquired hallux limitus of both feet 06/12/2019  . Sleep related hypoxia 01/09/2017  . Periodic limb movements of sleep 01/09/2017  . OSA (obstructive  sleep apnea) 11/24/2016  . Upper GI bleed 07/06/2011  . GI bleeding 07/05/2011  . Anemia associated with acute blood loss 07/05/2011  . DM (diabetes mellitus), type 2 ([place]) 07/05/2011  . Diabetes mellitus with coincident hypertension ([place]) 07/05/2011  . Hyperlipidemia 07/05/2011    Madelyn Flavors PT 09/15/2020, 12:02 PM  [facility] [address] [place], [place], [address] Phone: [phone]   Fax:  [phone]  Name: TENNIS MCKINNON MRN: [id number] Date of Birth: [birth year]

## 2020-09-22 ENCOUNTER — Other Ambulatory Visit: Payer: Self-pay | Admitting: Adult Health

## 2020-09-23 ENCOUNTER — Ambulatory Visit: Payer: Medicare PPO | Admitting: Internal Medicine

## 2020-09-29 ENCOUNTER — Ambulatory Visit: Payer: Medicare PPO | Admitting: Physical Therapy

## 2020-09-29 ENCOUNTER — Encounter: Payer: Self-pay | Admitting: Physical Therapy

## 2020-09-29 ENCOUNTER — Other Ambulatory Visit: Payer: Self-pay

## 2020-09-29 DIAGNOSIS — M6281 Muscle weakness (generalized): Secondary | ICD-10-CM

## 2020-09-29 DIAGNOSIS — R262 Difficulty in walking, not elsewhere classified: Secondary | ICD-10-CM | POA: Diagnosis not present

## 2020-09-29 NOTE — Therapy (Signed)
[facility] [address], [place], [place], [address] Phone: [phone]   Fax:  [phone]  Physical Therapy Treatment  Patient Details  Name: Randy [name] MRN: [id number] Date of Birth: 1941-01-17 Referring Provider (PT): Dorothyann Peng NP   Encounter Date: 09/29/2020   PT End of Session - 09/29/20 1138    Visit Number 2    Number of Visits 4    Date for PT Re-Evaluation 11/10/20    Authorization Type [employer] MCR    Authorization Time Period 09/15/20 to 11/10/20    Authorization - Visit Number 2    Authorization - Number of Visits 4    PT Start Time 1102    PT Stop Time 1126    PT Time Calculation (min) 24 min    Activity Tolerance Patient tolerated treatment well    Behavior During Therapy [facility] for tasks assessed/performed           Past Medical History:  Diagnosis Date  . Diabetes mellitus   . Hypertension   . OSA (obstructive sleep apnea) 11/24/2016    Past Surgical History:  Procedure Laterality Date  . CATARACT EXTRACTION, BILATERAL Bilateral 2019  . COLONOSCOPY  07/06/2011   Procedure: COLONOSCOPY;  Surgeon: Winfield Cunas., MD;  Location: [facility] ENDOSCOPY;  Service: Endoscopy;  Laterality: N/A;  . ESOPHAGOGASTRODUODENOSCOPY  07/05/2011   Procedure: ESOPHAGOGASTRODUODENOSCOPY (EGD);  Surgeon: Landry Dyke, MD;  Location: [facility] ENDOSCOPY;  Service: Endoscopy;  Laterality: N/A;    There were no vitals filed for this visit.   Subjective Assessment - 09/29/20 1103    Subjective Patient reports bilateral flank pain that occurs intermittently.    Pertinent History HTN, DM, OA    Patient Stated Goals Get back to walking 2 miles/day    Currently in Pain? No/denies              [facility] PT Assessment - 09/29/20 0001      Single Leg Stance   Comments Rt 10s; Lt 10s      Standardized Balance Assessment   Standardized Balance Assessment Berg Balance Test      Berg Balance Test   Sit to Stand Able  to stand without using hands and stabilize independently    Standing Unsupported Able to stand safely 2 minutes    Sitting with Back Unsupported but Feet Supported on Floor or Stool Able to sit safely and securely 2 minutes    Stand to Sit Sits safely with minimal use of hands    Transfers Able to transfer safely, minor use of hands    Standing Unsupported with Eyes Closed Able to stand 10 seconds safely    Standing Unsupported with Feet Together Able to place feet together independently and stand 1 minute safely    From Standing, Reach Forward with Outstretched Arm Can reach forward >12 cm safely (5")    From Standing Position, Pick up Object from Floor Able to pick up shoe safely and easily    From Standing Position, Turn to Look Behind Over each Shoulder Looks behind one side only/other side shows less weight shift    Turn 360 Degrees Able to turn 360 degrees safely in 4 seconds or less    Standing Unsupported, Alternately Place Feet on Step/Stool Able to complete 4 steps without aid or supervision    Standing Unsupported, One Foot in Front Able to plae foot ahead of the other independently and hold 30 seconds    Standing on  One Leg Able to lift leg independently and hold 5-10 seconds    Total Score 50                         [facility] Adult PT Treatment/Exercise - 09/29/20 0001      High Level Balance   High Level Balance Activities Side stepping    High Level Balance Comments with green loop at ankles, 4x65f distance; monster walk with green loop at ankles, 4x166fdistance                  PT Education - 09/29/20 1137    Education Details Access Code: [email or web address]; educating patient on importance of balance with aging; reviewing HEP    Person(s) Educated Patient    Methods Explanation;Handout    Comprehension Verbalized understanding            PT Short Term Goals - 09/29/20 1142      PT SHORT TERM GOAL #1   Title be independent in initial HEP    Time 2     Period Weeks    Status Achieved    Target Date 09/29/20      PT SHORT TERM GOAL #2   Title Improved endurance with walking to 1 mile    Time 4    Period Weeks    Status Partially Met    Target Date 10/13/20             PT Long Term Goals - 09/29/20 1142      PT LONG TERM GOAL #1   Title be independent in advanced HEP    Time 8    Period Weeks    Status On-going      PT LONG TERM GOAL #2   Title perform single leg stance on Lt x 8 seconds each to improve safety and balance    Baseline Rt 30 seconds, Lt 8 seconds    Time 8    Period Weeks    Status Achieved   10 seconds     PT LONG TERM GOAL #3   Title descend steps with reciprocal gait and good eccentric control to improve safety on steps at home    Baseline demonstrates moderate eccentric control    Time 8    Period Weeks    Status Not Met      PT LONG TERM GOAL #4   Title Patient to demonstrate improved bil ankle flexibility as evidenced by decreased shuffling with gait at home.    Time 8    Period Weeks    Status Achieved   Demonstrating improved foot clearance with ambulation in clinic                Plan - 09/29/20 1139    Clinical Impression Statement Patient is an 89/o male referred due to gait instability and weakness. Patient wanting to D/C as he feels that he has been compliant with HEP and copay is becoming a financial barrier. Would like to review HEP this date as he lost his copy. Patient scoring 50/56 on BERG indicating low fall risk. SLS improved as patient able to maintain SLS x10s bilaterally this date. Noting continued weakness of hip abductors as patient unable to maintain neutral hip rotation with side stepping however, patient able to correct with cuing. Suspect that patient will continue to progress with HEP. Therapist stressing importance of remaining compliant and consistent. Patient verbalizing understanding. No further need for skilled  intervention. Therapist happy to reassess should  patient status change.    Personal Factors and Comorbidities Comorbidity 3+    Comorbidities HTN, DM, OA    Examination-Activity Limitations Locomotion Level    Rehab Potential Excellent    PT Frequency Biweekly    PT Duration 8 weeks    PT Treatment/Interventions ADLs/Self Care Home Management;Stair training;Gait training;Therapeutic activities;Therapeutic exercise;Balance training;Neuromuscular re-education;Patient/family education;Manual techniques;Dry needling    PT Home Exercise Plan [email or web address]    Consulted and Agree with Plan of Care Patient           Patient will benefit from skilled therapeutic intervention in order to improve the following deficits and impairments:  Decreased endurance,Decreased activity tolerance,Decreased balance,Impaired flexibility,Decreased strength,Pain  Visit Diagnosis: Muscle weakness (generalized)  Difficulty in walking, not elsewhere classified     Problem List Patient Active Problem List   Diagnosis Date Noted  . Dyspnea on exertion 03/18/2020  . PVC (premature ventricular contraction) 03/18/2020  . Acquired hallux limitus of both feet 06/12/2019  . Sleep related hypoxia 01/09/2017  . Periodic limb movements of sleep 01/09/2017  . OSA (obstructive sleep apnea) 11/24/2016  . Upper GI bleed 07/06/2011  . GI bleeding 07/05/2011  . Anemia associated with acute blood loss 07/05/2011  . DM (diabetes mellitus), type 2 ([place]) 07/05/2011  . Diabetes mellitus with coincident hypertension ([place]) 07/05/2011  . Hyperlipidemia 07/05/2011   PHYSICAL THERAPY DISCHARGE SUMMARY  Visits from Start of Care: 2  Current functional level related to goals / functional outcomes: See above   Remaining deficits: See above   Education / Equipment: See above  Plan: Patient agrees to discharge.  Patient goals were not met. Patient is being discharged due to the patient's request.  ?????        Abelino Derrick  09/29/2020, 2:38 PM  [facility] [address], [place], [place], [address] Phone: [phone]   Fax:  [phone]  Name: Randy [name] MRN: [id number] Date of Birth: May 15, 1941

## 2020-10-08 ENCOUNTER — Other Ambulatory Visit: Payer: Self-pay | Admitting: Adult Health

## 2020-10-08 DIAGNOSIS — F339 Major depressive disorder, recurrent, unspecified: Secondary | ICD-10-CM

## 2020-11-03 ENCOUNTER — Other Ambulatory Visit: Payer: Self-pay

## 2020-11-03 ENCOUNTER — Encounter: Payer: Self-pay | Admitting: Adult Health

## 2020-11-03 ENCOUNTER — Ambulatory Visit: Payer: Medicare PPO | Admitting: Adult Health

## 2020-11-03 VITALS — BP 134/72 | HR 63 | Temp 97.4°F | Ht 68.0 in | Wt 175.6 lb

## 2020-11-03 DIAGNOSIS — E1100 Type 2 diabetes mellitus with hyperosmolarity without nonketotic hyperglycemic-hyperosmolar coma (NKHHC): Secondary | ICD-10-CM | POA: Diagnosis not present

## 2020-11-03 DIAGNOSIS — F339 Major depressive disorder, recurrent, unspecified: Secondary | ICD-10-CM | POA: Diagnosis not present

## 2020-11-03 DIAGNOSIS — R5383 Other fatigue: Secondary | ICD-10-CM | POA: Diagnosis not present

## 2020-11-03 MED ORDER — CITALOPRAM HYDROBROMIDE 10 MG PO TABS: 10.0000 mg | ORAL_TABLET | Freq: Every day | ORAL | 1 refills | Status: DC

## 2020-11-03 NOTE — Progress Notes (Signed)
Subjective:    Patient ID: Randy [name], male    DOB: 08/24/1940, 80 y.o.   MRN: [id number]  HPI  80 year old male who  has a past medical history of Diabetes mellitus, Hypertension, and OSA (obstructive sleep apnea) (11/24/2016).   He presents to the office today for ongoing generalized weakness and fatigue.  This is been a constant issue worry for the patient over the last few years.  His work-up in the past has been pretty unremarkable except for low vitamin B-12 which has been corrected.  He was last seen about 2 months ago and was referred to physical therapy which he  completed 2 out of 4 sessions and then discharged himself due to financial limitations but did make good progress and reports " I felt really good when I was doing it.     He was also started on Celexa due to concern of depression, as he was sleeping throughout most of the day, when he was up he was usually watching TV in the couch or taking a nap in the afternoon at noon. Does believe that his mood has improved since starting Celexa   Unfortunately, he continues to feel fatigued and weak.  Per patient "I can cut my grass which takes me about 15 to 20 minutes, but when I go for all walk I have to stop and rest due to feeling tired after a few minutes of rest I can continue again."  He denies shortness of breath, chest pain, or leg cramping during activity.  He has gone back to a pretty sedentary lifestyle throughout the day.  Does report sleeping well at night, and only gets up 1 or 2 times when he has to urinate.  Denies signs or symptoms of a UTI but would like to check his urine today as well as check his A1c due to history of well-controlled diabetes, but he has not followed up for an A1c check in about 11 months  Lab Results  Component Value Date   HGBA1C 6.3 11/21/2019      Review of Systems See HPI   Past Medical History:  Diagnosis Date  . Diabetes mellitus   . Hypertension   . OSA (obstructive sleep  apnea) 11/24/2016    Social History   Socioeconomic History  . Marital status: Married    Spouse name: Not on file  . Number of children: Not on file  . Years of education: Not on file  . Highest education level: Not on file  Occupational History  . Occupation: retired  Tobacco Use  . Smoking status: Former Smoker    Packs/day: 1.00    Years: 30.00    Pack years: 30.00    Types: Cigarettes    Quit date: 07/03/2001    Years since quitting: 19.3  . Smokeless tobacco: Former Systems developer    Types: [place] date: 07/04/1991  Vaping Use  . Vaping Use: Never used  Substance and Sexual Activity  . Alcohol use: Yes    Alcohol/week: 3.0 standard drinks    Types: 3 Glasses of wine per week    Comment: daily alcohol wine with dinner  . Drug use: No  . Sexual activity: Yes  Other Topics Concern  . Not on file  Social History Narrative   Retired - Management consultant plants    Married       He likes to play golf. He likes to read and work in the yard.  Social Determinants of Health   Financial Resource Strain: Low Risk   . Difficulty of Paying Living Expenses: Not hard at all  Food Insecurity: No Food Insecurity  . Worried About Charity fundraiser in the Last Year: Never true  . Ran Out of Food in the Last Year: Never true  Transportation Needs: No Transportation Needs  . Lack of Transportation (Medical): No  . Lack of Transportation (Non-Medical): No  Physical Activity: Insufficiently Active  . Days of Exercise per Week: 3 days  . Minutes of Exercise per Session: 30 min  Stress: No Stress Concern Present  . Feeling of Stress : Not at all  Social Connections: Moderately Isolated  . Frequency of Communication with Friends and Family: More than three times a week  . Frequency of Social Gatherings with Friends and Family: More than three times a week  . Attends Religious Services: Never  . Active Member of Clubs or Organizations: No  . Attends Archivist Meetings:  Never  . Marital Status: Married  Human resources officer Violence: Not At Risk  . Fear of Current or Ex-Partner: No  . Emotionally Abused: No  . Physically Abused: No  . Sexually Abused: No    Past Surgical History:  Procedure Laterality Date  . CATARACT EXTRACTION, BILATERAL Bilateral 2019  . COLONOSCOPY  07/06/2011   Procedure: COLONOSCOPY;  Surgeon: Winfield Cunas., MD;  [facility] END[facility]COPY;  Service: Endoscopy;  Laterality: N/A;  . ESOPHAGOGASTRODUODENOSCOPY  07/05/2011   Procedure: ESOPHAGOGASTRODUODENOSCOPY (EGD);  Surgeon: Landry Dyke, MD;  Location[facility]COPY;  Service: Endoscopy;  Laterality: N/A;    Family History  Problem Relation Age of Onset  . Brain cancer Mother   . Hypertension Father   . Diabetes Father   . Stroke Father     Allergies  Allergen Reactions  . Drug Ingredient [Atorvastatin]     Myalgia    Current Outpatient Medications on File Prior to Visit  Medication Sig Dispense Refill  . Blood Glucose Calibration (TRUE METRIX LEVEL 1) Low SOLN USE TO CHECK METER PRIOR TO TESTING SUGAR 3 each 0  . Blood Glucose Monitoring Suppl (TRUE METRIX AIR GLUCOSE METER) w/Device KIT 1 each by Does not apply route daily. 1 kit 0  . citalopram (CELEXA) 10 MG tablet TAKE ONE TABLET BY MOUTH DAILY 30 tablet 0  . Cyanocobalamin (VITAMIN B-12 PO) Take 5,000 mcg by mouth daily.    Marland Kitchen lisinopril-hydrochlorothiazide (ZESTORETIC) 20-12.5 MG tablet TAKE ONE TABLET BY MOUTH DAILY 90 tablet 0  . metFORMIN (GLUCOPHAGE) 1000 MG tablet TAKE ONE TABLET BY MOUTH DAILY WITH BREAKFAST 90 tablet 0  . TRUE METRIX BLOOD GLUCOSE TEST test strip USE TO TEST BLOOD GLUCOSE TWICE DAILY. 200 each 3  . TRUEPLUS LANCETS 33G MISC USE TO CHECK BLOOD SUGAR DAILY AND PRN 300 each 0  . Vitamin D, Ergocalciferol, (DRISDOL) 1.25 MG (50000 UNIT) CAPS capsule Take 1 capsule (50,000 Units total) by mouth every 7 (seven) days. 12 capsule 1   No current facility-administered medications on file prior to  visit.    There were no vitals taken for this visit.      Objective:   Physical Exam Vitals and nursing note reviewed.  Constitutional:      Appearance: Normal appearance.  Cardiovascular:     Rate and Rhythm: Normal rate and regular rhythm.     Pulses: Normal pulses.     Heart sounds: Normal heart sounds.  Pulmonary:     Effort:  Pulmonary effort is normal.     Breath sounds: Normal breath sounds.  Musculoskeletal:        General: Normal range of motion.  Skin:    General: Skin is warm and dry.  Neurological:     General: No focal deficit present.     Mental Status: He is oriented to person, place, and time.  Psychiatric:        Mood and Affect: Mood normal.        Behavior: Behavior normal.        Thought Content: Thought content normal.        Judgment: Judgment normal.       Assessment & Plan:  1. Other fatigue -Concern for cardiopulmonary cause.  This seems to be more of needing changes in his lifestyle to become more active.  I was hopeful that improving his progression would make him more active, although he does report increased mood he continues to lead a pretty sedentary lifestyle.  Encouraged to push himself activity but do not overdo it. - Hemoglobin A1c; Future - CBC with Differential/Platelet; Future - Comprehensive metabolic panel; Future - Urinalysis; Future - Urinalysis - Comprehensive metabolic panel - CBC with Differential/Platelet - Hemoglobin A1c  2. Depression, recurrent ([place]) - Better controlled. Will keep on Celexa 10 mg  - citalopram (CELEXA) 10 MG tablet; Take 1 tablet (10 mg total) by mouth daily.  Dispense: 90 tablet; Refill: 1  3. Type 2 diabetes mellitus with hyperosmolarity without coma, without long-term current use of insulin (HCC) - Consider increase in metformin  - Hemoglobin A1c; Future - CBC with Differential/Platelet; Future - Comprehensive metabolic panel; Future  Dorothyann Peng, NP

## 2020-11-04 LAB — COMPREHENSIVE METABOLIC PANEL
ALT: 24 U/L (ref 0–53)
AST: 21 U/L (ref 0–37)
Albumin: 4.4 g/dL (ref 3.5–5.2)
Alkaline Phosphatase: 61 U/L (ref 39–117)
BUN: 24 mg/dL — ABNORMAL HIGH (ref 6–23)
CO2: 36 mEq/L — ABNORMAL HIGH (ref 19–32)
Calcium: 11.5 mg/dL — ABNORMAL HIGH (ref 8.4–10.5)
Chloride: 97 mEq/L (ref 96–112)
Creatinine, Ser: 1.25 mg/dL (ref 0.40–1.50)
GFR: 54.52 mL/min — ABNORMAL LOW (ref >60.00)
Glucose, Bld: 129 mg/dL — ABNORMAL HIGH (ref 70–99)
Potassium: 4.6 mEq/L (ref 3.5–5.1)
Sodium: 138 mEq/L (ref 135–145)
Total Bilirubin: 0.4 mg/dL (ref 0.2–1.2)
Total Protein: 6.6 g/dL (ref 6.0–8.3)

## 2020-11-04 LAB — URINALYSIS
Bilirubin Urine: NEGATIVE
Hgb urine dipstick: NEGATIVE
Ketones, ur: NEGATIVE
Leukocytes,Ua: NEGATIVE
Nitrite: NEGATIVE
Specific Gravity, Urine: 1.015 (ref 1.000–1.030)
Total Protein, Urine: NEGATIVE
Urine Glucose: NEGATIVE
Urobilinogen, UA: 0.2 (ref 0.0–1.0)
pH: 7 (ref 5.0–8.0)

## 2020-11-04 LAB — CBC WITH DIFFERENTIAL/PLATELET
Basophils Absolute: 0 10*3/uL (ref 0.0–0.1)
Basophils Relative: 0.7 % (ref 0.0–3.0)
Eosinophils Absolute: 0.3 10*3/uL (ref 0.0–0.7)
Eosinophils Relative: 4.1 % (ref 0.0–5.0)
HCT: 39.2 % (ref 39.0–52.0)
Hemoglobin: 13 g/dL (ref 13.0–17.0)
Lymphocytes Relative: 20.9 % (ref 12.0–46.0)
Lymphs Abs: 1.4 10*3/uL (ref 0.7–4.0)
MCHC: 33.2 g/dL (ref 30.0–36.0)
MCV: 97.4 fl (ref 78.0–100.0)
Monocytes Absolute: 0.7 10*3/uL (ref 0.1–1.0)
Monocytes Relative: 10.2 % (ref 3.0–12.0)
Neutro Abs: 4.3 10*3/uL (ref 1.4–7.7)
Neutrophils Relative %: 64.1 % (ref 43.0–77.0)
Platelets: 176 10*3/uL (ref 150.0–400.0)
RBC: 4.03 Mil/uL — ABNORMAL LOW (ref 4.22–5.81)
RDW: 13.4 % (ref 11.5–15.5)
WBC: 6.7 10*3/uL (ref 4.0–10.5)

## 2020-11-04 LAB — HEMOGLOBIN A1C: Hgb A1c MFr Bld: 6.5 % (ref 4.6–6.5)

## 2020-11-05 ENCOUNTER — Other Ambulatory Visit: Payer: Self-pay | Admitting: Adult Health

## 2020-11-05 DIAGNOSIS — F339 Major depressive disorder, recurrent, unspecified: Secondary | ICD-10-CM

## 2020-11-06 ENCOUNTER — Other Ambulatory Visit: Payer: Self-pay | Admitting: Adult Health

## 2020-11-06 DIAGNOSIS — F339 Major depressive disorder, recurrent, unspecified: Secondary | ICD-10-CM

## 2020-11-06 MED ORDER — CITALOPRAM HYDROBROMIDE 10 MG PO TABS: 10.0000 mg | ORAL_TABLET | Freq: Every day | ORAL | 1 refills | Status: DC

## 2020-12-17 ENCOUNTER — Other Ambulatory Visit: Payer: Self-pay | Admitting: Adult Health

## 2020-12-18 ENCOUNTER — Encounter: Payer: Self-pay | Admitting: Adult Health

## 2020-12-21 NOTE — Telephone Encounter (Signed)
Spoke to pt and advised of Cory's office visit note" . Other fatigue -Concern for cardiopulmonary cause.  This seems to be more of needing changes in his lifestyle to become more active.  I was hopeful that improving his progression would make him more active, although he does report increased mood he continues to lead a pretty sedentary lifestyle.  Encouraged to push himself activity but do not overdo it."  Pt also asked if he should stop his Metformin. I advised pt that he needs to continue any medications that the doctor prescribes. I articulated to the pt that if he wanted to see someone at the office regarding matter he was more than welcome to. Pt stated he will return call if need and that he will try to do some walking etc.

## 2021-02-18 ENCOUNTER — Telehealth (INDEPENDENT_AMBULATORY_CARE_PROVIDER_SITE_OTHER): Payer: Medicare PPO | Admitting: Adult Health

## 2021-02-18 ENCOUNTER — Encounter: Payer: Self-pay | Admitting: Adult Health

## 2021-02-18 VITALS — BP 160/69 | HR 67 | Temp 98.0°F | Ht 68.0 in | Wt 175.0 lb

## 2021-02-18 DIAGNOSIS — U071 COVID-19: Secondary | ICD-10-CM

## 2021-02-18 MED ORDER — MOLNUPIRAVIR EUA 200MG CAPSULE: 4.0000 | ORAL_CAPSULE | Freq: Two times a day (BID) | ORAL | 0 refills | Status: AC

## 2021-02-18 NOTE — Progress Notes (Signed)
Virtual Visit via Video Note  I connected with Randy [name] on 02/18/21 at  4:00 PM EDT by a video enabled telemedicine application and verified that I am speaking with the correct person using two identifiers.  Location patient: home Location provider:work or home office Persons participating in the virtual visit: patient, provider  I discussed the limitations of evaluation and management by telemedicine and the availability of in person appointments. The patient expressed understanding and agreed to proceed.   HPI:  80 year old male who  has a past medical history of Diabetes mellitus, Hypertension, and OSA (obstructive sleep apnea) (11/24/2016).  He is being evaluated today for an acute issues. He tested positive for COVID 19 three days ago. His symptoms started 4 days.   Symptoms include sore throat ( improving) and productive cough getting worse.   Appetite is good. Denies fevers/chills/shortness of breath.   ROS: See pertinent positives and negatives per HPI.  Past Medical History:  Diagnosis Date   Diabetes mellitus    Hypertension    OSA (obstructive sleep apnea) 11/24/2016    Past Surgical History:  Procedure Laterality Date   CATARACT EXTRACTION, BILATERAL Bilateral 2019   COLONOSCOPY  07/06/2011   Procedure: COLONOSCOPY;  Surgeon: Winfield Cunas., MD;  Location: [facility] ENDOSCOPY;  Service: Endoscopy;  Laterality: N/A;   ESOPHAGOGASTRODUODENOSCOPY  07/05/2011   Procedure: ESOPHAGOGASTRODUODENOSCOPY (EGD);  Surgeon: Landry Dyke, MD;  Location: [facility] ENDOSCOPY;  Service: Endoscopy;  Laterality: N/A;    Family History  Problem Relation Age of Onset   Brain cancer Mother    Hypertension Father    Diabetes Father    Stroke Father        Current Outpatient Medications:    Blood Glucose Calibration (TRUE METRIX LEVEL 1) Low SOLN, USE TO CHECK METER PRIOR TO TESTING SUGAR, Disp: 3 each, Rfl: 0   Blood Glucose Monitoring Suppl (TRUE METRIX AIR GLUCOSE METER)  w/Device KIT, 1 each by Does not apply route daily., Disp: 1 kit, Rfl: 0   citalopram (CELEXA) 10 MG tablet, Take 1 tablet (10 mg total) by mouth daily., Disp: 90 tablet, Rfl: 1   Cyanocobalamin (B-12 PO), Take by mouth daily., Disp: , Rfl:    Cyanocobalamin (VITAMIN B-12 PO), Take 5,000 mcg by mouth daily., Disp: , Rfl:    lisinopril-hydrochlorothiazide (ZESTORETIC) 20-12.5 MG tablet, TAKE ONE TABLET BY MOUTH DAILY, Disp: 90 tablet, Rfl: 0   metFORMIN (GLUCOPHAGE) 1000 MG tablet, TAKE ONE TABLET BY MOUTH DAILY WITH BREAKFAST, Disp: 90 tablet, Rfl: 0   TRUE METRIX BLOOD GLUCOSE TEST test strip, USE TO TEST BLOOD GLUCOSE TWICE DAILY., Disp: 200 each, Rfl: 3   TRUEPLUS LANCETS 33G MISC, USE TO CHECK BLOOD SUGAR DAILY AND PRN, Disp: 300 each, Rfl: 0   VITAMIN D PO, Take by mouth daily., Disp: , Rfl:   EXAM:  VITALS per patient if applicable:  GENERAL: alert, oriented, appears well and in no acute distress  HEENT: atraumatic, conjunttiva clear, no obvious abnormalities on inspection of external nose and ears  NECK: normal movements of the head and neck  LUNGS: on inspection no signs of respiratory distress, breathing rate appears normal, no obvious gross SOB, gasping or wheezing  CV: no obvious cyanosis  MS: moves all visible extremities without noticeable abnormality  PSYCH/NEURO: pleasant and cooperative, no obvious depression or anxiety, speech and thought processing grossly intact  ASSESSMENT AND PLAN:  Discussed the following assessment and plan:  1. COVID-19 - Due to age and productive  cough will start him on antiviral therapy.  - molnupiravir EUA 200 mg CAPS; Take 4 capsules (800 mg total) by mouth 2 (two) times daily for 5 days.  Dispense: 40 capsule; Refill: 0  Dorothyann Peng, NP      I discussed the assessment and treatment plan with the patient. The patient was provided an opportunity to ask questions and all were answered. The patient agreed with the plan and  demonstrated an understanding of the instructions.   The patient was advised to call back or seek an in-person evaluation if the symptoms worsen or if the condition fails to improve as anticipated.   Dorothyann Peng, NP

## 2021-03-10 ENCOUNTER — Other Ambulatory Visit: Payer: Self-pay

## 2021-03-10 ENCOUNTER — Ambulatory Visit (INDEPENDENT_AMBULATORY_CARE_PROVIDER_SITE_OTHER): Payer: Medicare PPO

## 2021-03-10 DIAGNOSIS — Z0000 Encounter for general adult medical examination without abnormal findings: Secondary | ICD-10-CM | POA: Diagnosis not present

## 2021-03-10 NOTE — Patient Instructions (Signed)
[name] , Thank you for taking time to come for your Medicare Wellness Visit. I appreciate your ongoing commitment to your health goals. Please review the following plan we discussed and let me know if I can assist you in the future.   Screening recommendations/referrals: Colonoscopy: No longer required Recommended yearly ophthalmology/optometry visit for glaucoma screening and checkup Recommended yearly dental visit for hygiene and checkup  Vaccinations: Influenza vaccine: Due Pneumococcal vaccine: Due Tdap vaccine: Done 04/11/13 repeat every 10 years due 04/12/23 Shingles vaccine: Shingrix discussed. Please contact your pharmacy for coverage information.    Covid-19: Completed 1/21, 2/11, & 05/16/20  Advanced directives: Please bring a copy of your health care power of attorney and living will to the office at your convenience.   Conditions/risks identified: be able to walk 1 mile a day   Next appointment: Follow up in one year for your annual wellness visit.   Preventive Care 80 Years and Older, Male Preventive care refers to lifestyle choices and visits with your health care provider that can promote health and wellness. What does preventive care include? A yearly physical exam. This is also called an annual well check. Dental exams once or twice a year. Routine eye exams. Ask your health care provider how often you should have your eyes checked. Personal lifestyle choices, including: Daily care of your teeth and gums. Regular physical activity. Eating a healthy diet. Avoiding tobacco and drug use. Limiting alcohol use. Practicing safe sex. Taking low doses of aspirin every day. Taking vitamin and mineral supplements as recommended by your health care provider. What happens during an annual well check? The services and screenings done by your health care provider during your annual well check will depend on your age, overall health, lifestyle risk factors, and family  history of disease. Counseling  Your health care provider may ask you questions about your: Alcohol use. Tobacco use. Drug use. Emotional well-being. Home and relationship well-being. Sexual activity. Eating habits. History of falls. Memory and ability to understand (cognition). Work and work Statistician. Screening  You may have the following tests or measurements: Height, weight, and BMI. Blood pressure. Lipid and cholesterol levels. These may be checked every 5 years, or more frequently if you are over 38 years old. Skin check. Lung cancer screening. You may have this screening every year starting at age 10 if you have a 30-pack-year history of smoking and currently smoke or have quit within the past 15 years. Fecal occult blood test (FOBT) of the stool. You may have this test every year starting at age 25. Flexible sigmoidoscopy or colonoscopy. You may have a sigmoidoscopy every 5 years or a colonoscopy every 10 years starting at age 18. Prostate cancer screening. Recommendations will vary depending on your family history and other risks. Hepatitis C blood test. Hepatitis B blood test. Sexually transmitted disease (STD) testing. Diabetes screening. This is done by checking your blood sugar (glucose) after you have not eaten for a while (fasting). You may have this done every 1-3 years. Abdominal aortic aneurysm (AAA) screening. You may need this if you are a current or former smoker. Osteoporosis. You may be screened starting at age 37 if you are at high risk. Talk with your health care provider about your test results, treatment options, and if necessary, the need for more tests. Vaccines  Your health care provider may recommend certain vaccines, such as: Influenza vaccine. This is recommended every year. Tetanus, diphtheria, and acellular pertussis (Tdap, Td) vaccine. You may need  a Td booster every 10 years. Zoster vaccine. You may need this after age 25. Pneumococcal  13-valent conjugate (PCV13) vaccine. One dose is recommended after age 40. Pneumococcal polysaccharide (PPSV23) vaccine. One dose is recommended after age 69. Talk to your health care provider about which screenings and vaccines you need and how often you need them. This information is not intended to replace advice given to you by your health care provider. Make sure you discuss any questions you have with your health care provider. Document Released: 07/17/2015 Document Revised: 03/09/2016 Document Reviewed: 04/21/2015 Elsevier Interactive Patient Education  2017 [place] Prevention in the Home Falls can cause injuries. They can happen to people of all ages. There are many things you can do to make your home safe and to help prevent falls. What can I do on the outside of my home? Regularly fix the edges of walkways and driveways and fix any cracks. Remove anything that might make you trip as you walk through a door, such as a raised step or threshold. Trim any bushes or trees on the path to your home. Use bright outdoor lighting. Clear any walking paths of anything that might make someone trip, such as rocks or tools. Regularly check to see if handrails are loose or broken. Make sure that both sides of any steps have handrails. Any raised decks and porches should have guardrails on the edges. Have any leaves, snow, or ice cleared regularly. Use sand or salt on walking paths during winter. Clean up any spills in your garage right away. This includes oil or grease spills. What can I do in the bathroom? Use night lights. Install grab bars by the toilet and in the tub and shower. Do not use towel bars as grab bars. Use non-skid mats or decals in the tub or shower. If you need to sit down in the shower, use a plastic, non-slip stool. Keep the floor dry. Clean up any water that spills on the floor as soon as it happens. Remove soap buildup in the tub or shower regularly. Attach  bath mats securely with double-sided non-slip rug tape. Do not have throw rugs and other things on the floor that can make you trip. What can I do in the bedroom? Use night lights. Make sure that you have a light by your bed that is easy to reach. Do not use any sheets or blankets that are too big for your bed. They should not hang down onto the floor. Have a firm chair that has side arms. You can use this for support while you get dressed. Do not have throw rugs and other things on the floor that can make you trip. What can I do in the kitchen? Clean up any spills right away. Avoid walking on wet floors. Keep items that you use a lot in easy-to-reach places. If you need to reach something above you, use a strong step stool that has a grab bar. Keep electrical cords out of the way. Do not use floor polish or wax that makes floors slippery. If you must use wax, use non-skid floor wax. Do not have throw rugs and other things on the floor that can make you trip. What can I do with my stairs? Do not leave any items on the stairs. Make sure that there are handrails on both sides of the stairs and use them. Fix handrails that are broken or loose. Make sure that handrails are as long as the stairways. Check  any carpeting to make sure that it is firmly attached to the stairs. Fix any carpet that is loose or worn. Avoid having throw rugs at the top or bottom of the stairs. If you do have throw rugs, attach them to the floor with carpet tape. Make sure that you have a light switch at the top of the stairs and the bottom of the stairs. If you do not have them, ask someone to add them for you. What else can I do to help prevent falls? Wear shoes that: Do not have high heels. Have rubber bottoms. Are comfortable and fit you well. Are closed at the toe. Do not wear sandals. If you use a stepladder: Make sure that it is fully opened. Do not climb a closed stepladder. Make sure that both sides of the  stepladder are locked into place. Ask someone to hold it for you, if possible. Clearly mark and make sure that you can see: Any grab bars or handrails. First and last steps. Where the edge of each step is. Use tools that help you move around (mobility aids) if they are needed. These include: Canes. Walkers. Scooters. Crutches. Turn on the lights when you go into a dark area. Replace any light bulbs as soon as they burn out. Set up your furniture so you have a clear path. Avoid moving your furniture around. If any of your floors are uneven, fix them. If there are any pets around you, be aware of where they are. Review your medicines with your doctor. Some medicines can make you feel dizzy. This can increase your chance of falling. Ask your doctor what other things that you can do to help prevent falls. This information is not intended to replace advice given to you by your health care provider. Make sure you discuss any questions you have with your health care provider. Document Released: 04/16/2009 Document Revised: 11/26/2015 Document Reviewed: 07/25/2014 Elsevier Interactive Patient Education  2017 [employer].

## 2021-03-10 NOTE — Progress Notes (Signed)
Virtual Visit via Telephone Note  I connected with  Randy [name] on 03/10/21 at  1:00 PM EDT by telephone and verified that I am speaking with the correct person using two identifiers.  Medicare Annual Wellness visit completed telephonically due to Covid-19 pandemic.   Persons participating in this call: This Health Coach and this patient.   Location: Patient: home Provider: office   I discussed the limitations, risks, security and privacy concerns of performing an evaluation and management service by telephone and the availability of in person appointments. The patient expressed understanding and agreed to proceed.  Unable to perform video visit due to video visit attempted and failed and/or patient does not have video capability.   Some vital signs may be absent or patient reported.   Willette Brace, LPN    Subjective:   Randy [name] is a 80 y.o. male who presents for Medicare Annual/Subsequent preventive examination.  Review of Systems     Cardiac Risk Factors include: advanced age (>13mn, >>60women);hypertension;male gender;dyslipidemia;diabetes mellitus     Objective:    There were no vitals filed for this visit. There is no height or weight on file to calculate BMI.  Advanced Directives 03/10/2021 09/15/2020 03/31/2020 12/10/2018 12/21/2016 07/05/2011 07/04/2011  Does Patient Have a Medical Advance Directive? _0  Patient does not have advance directive Patient has advance directive, copy not in chart  Type of Advance Directive Healthcare Power of [place] IslandLiving will [place]Living will HG[place]Living will [place]ewis[place]Living will [place]ache CreekLiving will Living will  Does patient want to make changes to medical advance directive? - No - Patient declined No - Patient declined No - Patient declined No - Patient declined - -  Copy of [place]earborn Heightsin Chart? No - copy requested No - copy requested No - copy requested No - copy requested No - copy requested - Copy requested from family  Pre-existing out of facility DNR order (yellow form or pink MOST form) - - - - - - Yes, notify physician for inpatient order    Current Medications (verified) Outpatient Encounter Medications as of 03/10/2021  Medication Sig   Blood Glucose Calibration (TRUE METRIX LEVEL 1) Low SOLN USE TO CHECK METER PRIOR TO TESTING SUGAR   Blood Glucose Monitoring Suppl (TRUE METRIX AIR GLUCOSE METER) w/Device KIT 1 each by Does not apply route daily.   citalopram (CELEXA) 10 MG tablet Take 1 tablet (10 mg total) by mouth daily.   Cyanocobalamin (VITAMIN B-12 PO) Take 5,000 mcg by mouth daily.   lisinopril-hydrochlorothiazide (ZESTORETIC) 20-12.5 MG tablet TAKE ONE TABLET BY MOUTH DAILY   metFORMIN (GLUCOPHAGE) 1000 MG tablet TAKE ONE TABLET BY MOUTH DAILY WITH BREAKFAST   TRUE METRIX BLOOD GLUCOSE TEST test strip USE TO TEST BLOOD GLUCOSE TWICE DAILY.   TRUEPLUS LANCETS 33G MISC USE TO CHECK BLOOD SUGAR DAILY AND PRN   VITAMIN D PO Take by mouth daily.   [DISCONTINUED] Cyanocobalamin (B-12 PO) Take by mouth daily.   No facility-administered encounter medications on file as of 03/10/2021.    Allergies (verified) Drug ingredient [atorvastatin]   History: Past Medical History:  Diagnosis Date   Diabetes mellitus    Hypertension    OSA (obstructive sleep apnea) 11/24/2016   Past Surgical History:  Procedure Laterality Date   CATARACT EXTRACTION, BILATERAL Bilateral 2019   COLONOSCOPY  07/06/2011   Procedure: COLONOSCOPY;  Surgeon: JJeneen Rinks  Angeline Slim., MD;  Location[facility] ENDOSCOPY;  Service: Endoscopy;  Laterality: N/A;   ESOPHAGOGASTRODUODENOSCOPY  07/05/2011   Procedure: ESOPHAGOGASTRODUODENOSCOPY (EGD);  Surgeon: Landry Dyke, MD;  Location: [facility] ENDOSCOPY;  Service: Endoscopy;  Laterality: N/A;   Family History  Problem Relation Age of Onset   Brain  cancer Mother    Hypertension Father    Diabetes Father    Stroke Father    Social History   Socioeconomic History   Marital status: Married    Spouse name: Not on file   Number of children: Not on file   Years of education: Not on file   Highest education level: Not on file  Occupational History   Occupation: retired  Tobacco Use   Smoking status: Former    Packs/day: 1.00    Years: 30.00    Pack years: 30.00    Types: Cigarettes    Quit date: 07/03/2001    Years since quitting: 19.6   Smokeless tobacco: Former    Types: Chew    Quit date: 07/04/1991  Vaping Use   Vaping Use: Never used  Substance and Sexual Activity   Alcohol use: Yes    Alcohol/week: 3.0 standard drinks    Types: 3 Glasses of wine per week    Comment: daily alcohol wine with dinner   Drug use: No   Sexual activity: Yes  Other Topics Concern   Not on file  Social History Narrative   Retired - managed textile plants    Married       He likes to play golf. He likes to read and work in the yard.       Social Determinants of Health   Financial Resource Strain: Low Risk    Difficulty of Paying Living Expenses: Not hard at all  Food Insecurity: No Food Insecurity   Worried About Charity fundraiser in the Last Year: Never true   [place] in the Last Year: Never true  Transportation Needs: No Transportation Needs   Lack of Transportation (Medical): No   Lack of Transportation (Non-Medical): No  Physical Activity: Insufficiently Active   Days of Exercise per Week: 1 day   Minutes of Exercise per Session: 20 min  Stress: No Stress Concern Present   Feeling of Stress : Not at all  Social Connections: Moderately Isolated   Frequency of Communication with Friends and Family: Three times a week   Frequency of Social Gatherings with Friends and Family: Once a week   Attends Religious Services: Never   Marine scientist or Organizations: No   Attends Music therapist: Never    Marital Status: Married    Tobacco Counseling Counseling given: Not Answered   Clinical Intake:  Pre-visit preparation completed: Yes  Pain : No/denies pain     BMI - recorded: 26.61 Nutritional Status: BMI 25 -29 Overweight Nutritional Risks: None Diabetes: Yes CBG done?: No Did pt. bring in CBG monitor from home?: No  How often do you need to have someone help you when you read instructions, pamphlets, or other written materials from your doctor or pharmacy?: 1 - Never  Diabetic?Nutrition Risk Assessment:  Has the patient had any N/V/D within the last 2 months?  No  Does the patient have any non-healing wounds?  No  Has the patient had any unintentional weight loss or weight gain?  No   Diabetes:  Is the patient diabetic?  Yes  If diabetic, was a CBG  obtained today?  No  Did the patient bring in their glucometer from home?  No  How often do you monitor your CBG's? As needed.   Financial Strains and Diabetes Management:  Are you having any financial strains with the device, your supplies or your medication? No .  Does the patient want to be seen by Chronic Care Management for management of their diabetes?  No  Would the patient like to be referred to a Nutritionist or for Diabetic Management?  No   Diabetic Exams:  Diabetic Eye Exam: Completed 04/15/20 Diabetic Foot Exam: Overdue, Pt has been advised about the importance in completing this exam. Pt is scheduled for diabetic foot exam on next appt .   Interpreter Needed?: No  Information entered by :: Charlott Rakes, LPN   Activities of Daily Living In your present state of health, do you have any difficulty performing the following activities: 03/10/2021 03/31/2020  Hearing? Tempie Donning  Comment HOH Has difficulties hearing tv  Vision? N N  Difficulty concentrating or making decisions? Y N  Walking or climbing stairs? Y N  Comment energy gives out -  Dressing or bathing? N N  Doing errands, shopping? N N   Preparing Food and eating ? N N  Using the Toilet? N N  In the past six months, have you accidently leaked urine? Y Y  Comment - Has occassional bladder leakage  Do you have problems with loss of bowel control? Y Y  Comment - has had a few incidents of loss bowel control  Managing your Medications? N N  Managing your Finances? N N  Housekeeping or managing your Housekeeping? N N  Some recent data might be hidden    Patient Care Team: Dorothyann Peng, NP as PCP - General (Family Medicine) Werner Lean, MD as PCP - Cardiology (Cardiology)  Indicate any recent Medical Services you may have received from other than [facility] providers in the past year (date may be approximate).     Assessment:   This is a routine wellness examination for Randy [name].  Hearing/Vision screen Hearing Screening - Comments:: Pt stated HOH  Vision Screening - Comments:: Pt follows up with [facility] for annual eye exmas  Dietary issues and exercise activities discussed: Current Exercise Habits: Home exercise routine, Type of exercise: walking;Other - see comments (PT), Time (Minutes): 20, Frequency (Times/Week): 1, Weekly Exercise (Minutes/Week): 20   Goals Addressed             This Visit's Progress    Patient Stated       Be able to walk a mile a day       Depression Screen PHQ 2/9 Scores 03/10/2021 03/10/2021 04/07/2020 04/07/2020 03/31/2020 12/07/2018 11/07/2017  PHQ - 2 Score 0 0 1 0 2 0 0  PHQ- 9 Score - - _0 - -    Fall Risk Fall Risk  03/10/2021 03/31/2020 12/07/2018 11/07/2017 05/08/2015  Falls in the past year? 1 0 1 No Yes  Number falls in past yr: 1 0 0 - 2 or more  Injury with Fall? 0 0 0 - No  Risk Factor Category  - - - - High Fall Risk  Risk for fall due to : Impaired vision;Impaired balance/gait;Impaired mobility History of fall(s) Impaired balance/gait - Impaired balance/gait  Follow up Falls prevention discussed Falls evaluation completed;Falls prevention discussed Falls  evaluation completed;Education provided;Falls prevention discussed - -    FALL RISK PREVENTION PERTAINING TO THE HOME:  Any stairs in  or around the home? Yes  If so, are there any without handrails? No  Home free of loose throw rugs in walkways, pet beds, electrical cords, etc? Yes  Adequate lighting in your home to reduce risk of falls? Yes   ASSISTIVE DEVICES UTILIZED TO PREVENT FALLS:  Life alert? No  Use of a cane, walker or w/c? No  Grab bars in the bathroom? No  Shower chair or bench in shower? Yes  Elevated toilet seat or a handicapped toilet? No   TIMED UP AND GO:  Was the test performed? No .   Cognitive Function: MMSE - Mini Mental State Exam 11/21/2019  Orientation to time 5  Orientation to Place 5  Registration 3  Attention/ Calculation 5  Recall 1  Language- name 2 objects 2  Language- repeat 1  Language- follow 3 step command 3  Language- read & follow direction 1  Write a sentence 1  Copy design 1  Total score 28     6CIT Screen 03/10/2021 03/31/2020  What Year? 0 points 0 points  What month? 0 points 0 points  What time? 0 points 0 points  Count back from 20 0 points 0 points  Months in reverse 0 points 0 points  Repeat phrase 0 points 0 points  Total Score 0 0    Immunizations Immunization History  Administered Date(s) Administered   Fluad Quad(high Dose 65+) 04/30/2019, 05/19/2020   Influenza, High Dose Seasonal PF 04/11/2013, 05/08/2015, 04/20/2016, 04/12/2017, 04/10/2018   Influenza,inj,Quad PF,6+ Mos 05/01/2014   PFIZER(Purple Top)SARS-COV-2 Vaccination 07/25/2019, 08/15/2019, 05/16/2020   Pneumococcal Conjugate-13 12/19/2013   Pneumococcal Polysaccharide-23 04/22/2005, 05/08/2015   Tdap 04/11/2013    TDAP status: Up to date  Flu Vaccine status: Due, Education has been provided regarding the importance of this vaccine. Advised may receive this vaccine at local pharmacy or Health Dept. Aware to provide a copy of the vaccination record if  obtained from local pharmacy or Health Dept. Verbalized acceptance and understanding.  Pneumococcal vaccine status: Up to date  Covid-19 vaccine status: Completed vaccines  Qualifies for Shingles Vaccine? Yes   Zostavax completed No Shingrix Completed?: No.    Education has been provided regarding the importance of this vaccine. Patient has been advised to call insurance company to determine out of pocket expense if they have not yet received this vaccine. Advised may also receive vaccine at local pharmacy or Health Dept. Verbalized acceptance and understanding.  Screening Tests Health Maintenance  Topic Date Due   Zoster Vaccines- Shingrix (1 of 2) Never done   FOOT EXAM  12/07/2019   COVID-19 Vaccine (4 - Booster for Pfizer series) 09/13/2020   INFLUENZA VACCINE  02/01/2021   OPHTHALMOLOGY EXAM  04/15/2021   HEMOGLOBIN A1C  05/06/2021   TETANUS/TDAP  04/12/2023   PNA vac Low Risk Adult  Completed   HPV VACCINES  Aged Out    Health Maintenance  Health Maintenance Due  Topic Date Due   Zoster Vaccines- Shingrix (1 of 2) Never done   FOOT EXAM  12/07/2019   COVID-19 Vaccine (4 - Booster for Pfizer series) 09/13/2020   INFLUENZA VACCINE  02/01/2021    Colorectal cancer screening: No longer required.   Additional Screening:   Vision Screening: Recommended annual ophthalmology exams for early detection of glaucoma and other disorders of the eye. Is the patient up to date with their annual eye exam?  Yes  Who is the provider or what is the name of the office in which the patient  attends annual eye exams? [facility] If pt is not established with a provider, would they like to be referred to a provider to establish care? No .   Dental Screening: Recommended annual dental exams for proper oral hygiene  Community Resource Referral / Chronic Care Management: CRR required this visit?  No   CCM required this visit?  No      Plan:     I have personally reviewed and noted  the following in the patient's chart:   Medical and social history Use of alcohol, tobacco or illicit drugs  Current medications and supplements including opioid prescriptions. Patient is not currently taking opioid prescriptions. Functional ability and status Nutritional status Physical activity Advanced directives List of other physicians Hospitalizations, surgeries, and ER visits in previous 12 months Vitals Screenings to include cognitive, depression, and falls Referrals and appointments  In addition, I have reviewed and discussed with patient certain preventive protocols, quality metrics, and best practice recommendations. A written personalized care plan for preventive services as well as general preventive health recommendations were provided to patient.     Willette Brace, LPN   08/05/9796   Nurse Notes: Pt is requesting a referal for a dermatologist for areas on his head, He is also very concerned stating after 2 yer

## 2021-03-17 ENCOUNTER — Encounter: Payer: Self-pay | Admitting: Adult Health

## 2021-03-19 ENCOUNTER — Encounter: Payer: Self-pay | Admitting: Adult Health

## 2021-03-19 ENCOUNTER — Ambulatory Visit: Payer: Medicare PPO | Admitting: Adult Health

## 2021-03-19 ENCOUNTER — Other Ambulatory Visit: Payer: Self-pay

## 2021-03-19 ENCOUNTER — Ambulatory Visit (INDEPENDENT_AMBULATORY_CARE_PROVIDER_SITE_OTHER)
Admission: RE | Admit: 2021-03-19 | Discharge: 2021-03-19 | Disposition: A | Discharge status: Home / Self Care | Payer: Medicare PPO | Source: Ambulatory Visit | Attending: Adult Health | Admitting: Adult Health

## 2021-03-19 VITALS — BP 110/60 | HR 59 | Temp 97.8°F | Ht 68.0 in | Wt 174.0 lb

## 2021-03-19 DIAGNOSIS — D492 Neoplasm of unspecified behavior of bone, soft tissue, and skin: Secondary | ICD-10-CM

## 2021-03-19 DIAGNOSIS — R2681 Unsteadiness on feet: Secondary | ICD-10-CM | POA: Diagnosis not present

## 2021-03-19 DIAGNOSIS — F339 Major depressive disorder, recurrent, unspecified: Secondary | ICD-10-CM | POA: Diagnosis not present

## 2021-03-19 DIAGNOSIS — G8929 Other chronic pain: Secondary | ICD-10-CM

## 2021-03-19 DIAGNOSIS — M545 Low back pain, unspecified: Secondary | ICD-10-CM | POA: Diagnosis not present

## 2021-03-19 DIAGNOSIS — R5383 Other fatigue: Secondary | ICD-10-CM

## 2021-03-19 IMAGING — DX DG LUMBAR SPINE COMPLETE 4+V
5 series · 5 of 5 positions shown · non-contrast
Comparison: None.

CLINICAL DATA: Lower back pain

EXAM:
LUMBAR SPINE - COMPLETE 4+ VIEW

[l-spine ap]
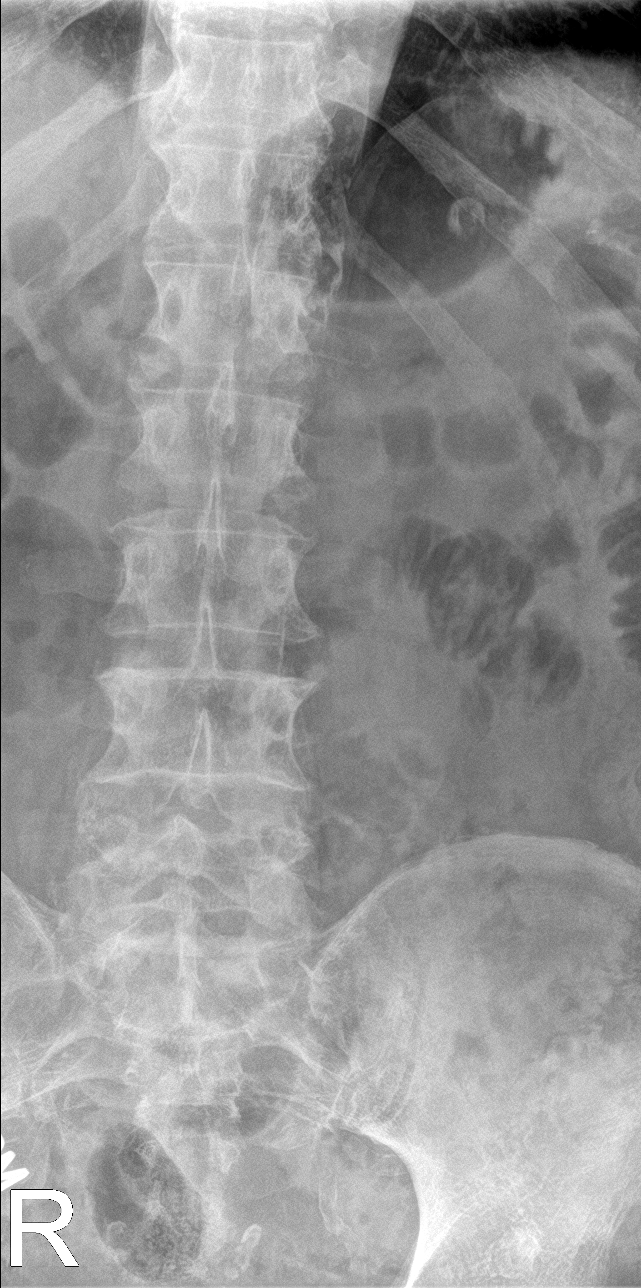

[l-spine obl (1 of 2)]
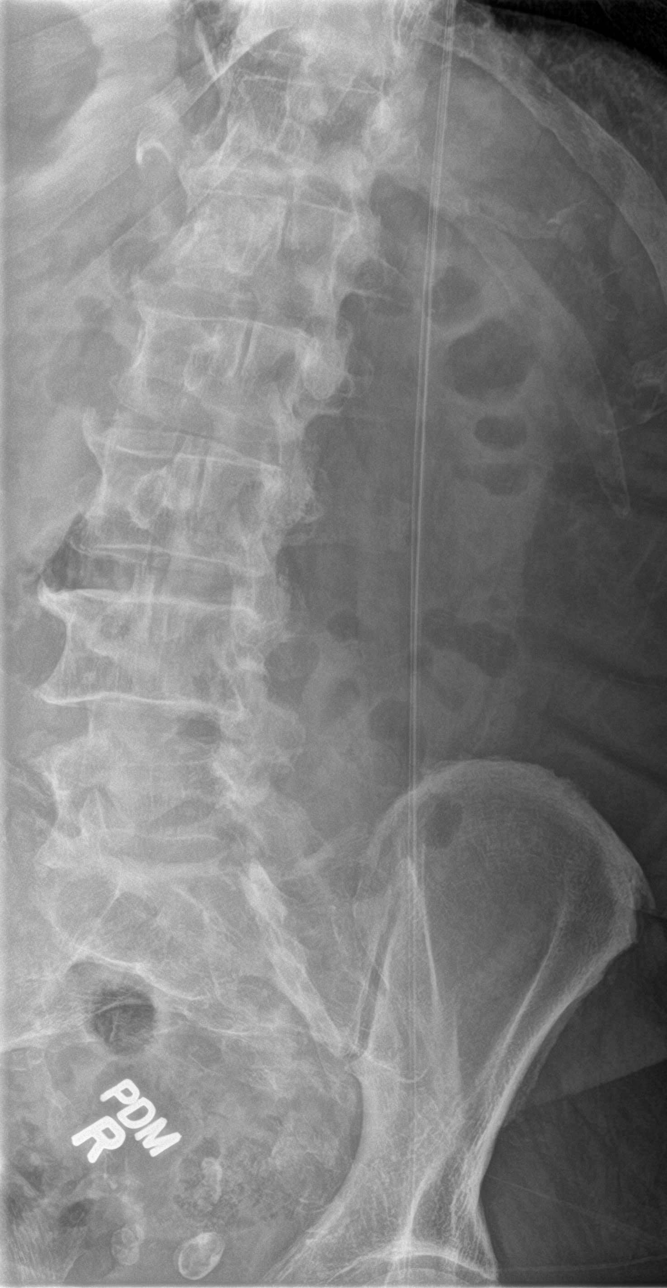

[l-spine obl (2 of 2)]
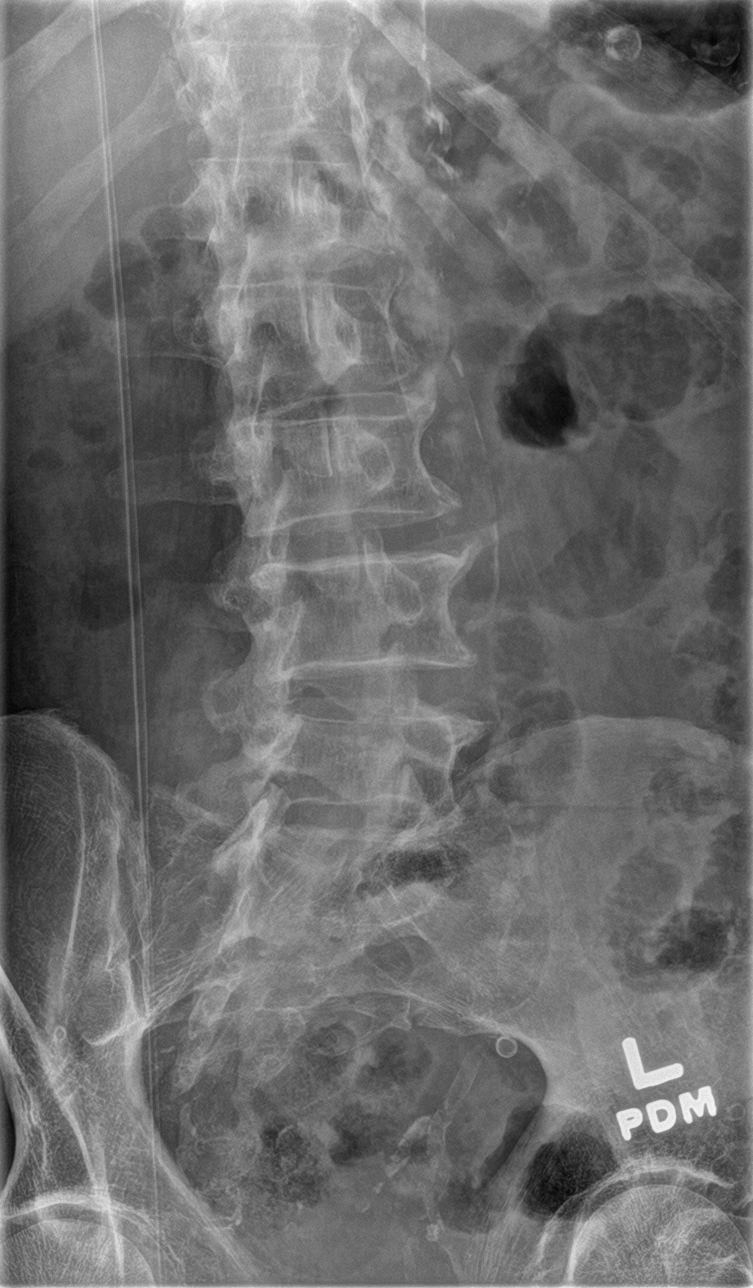

[l-spine lat]
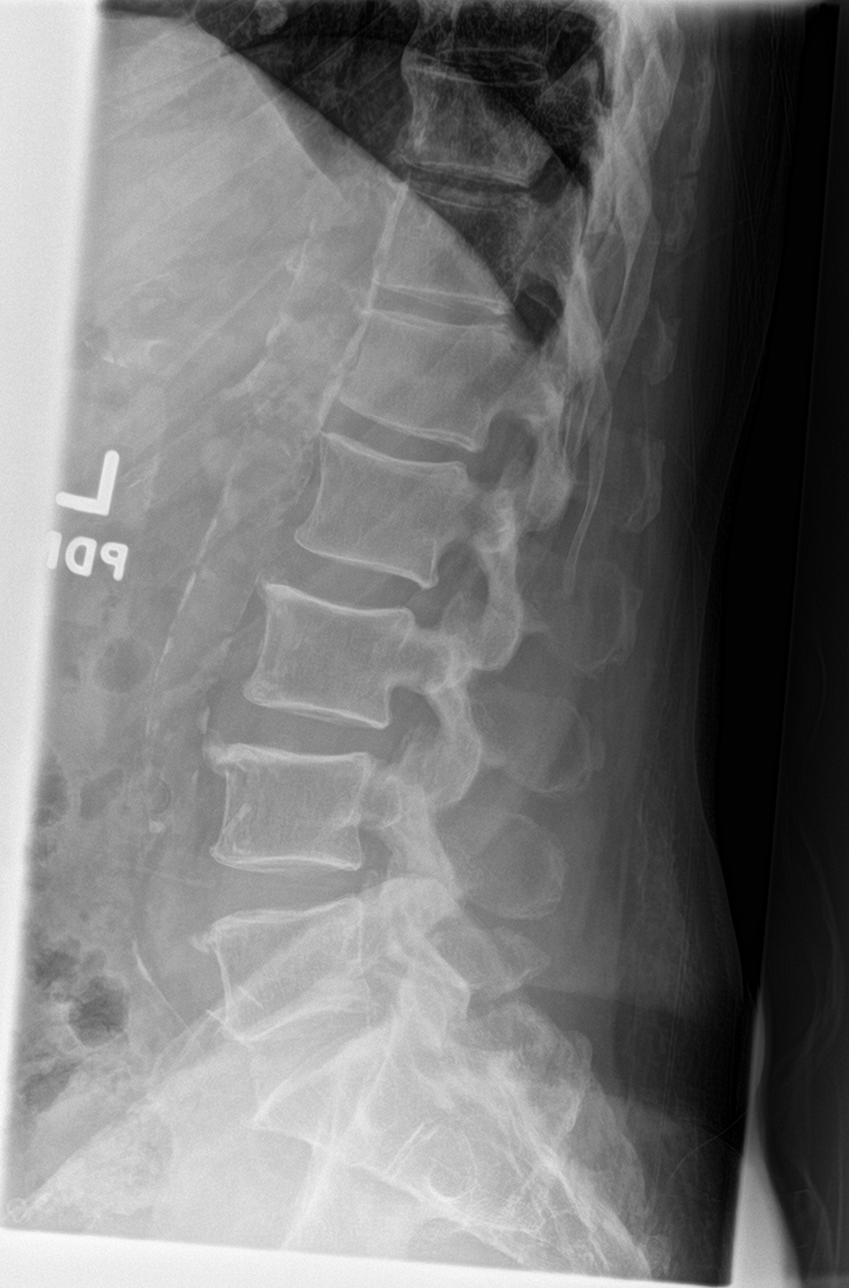

[l-spine spot]
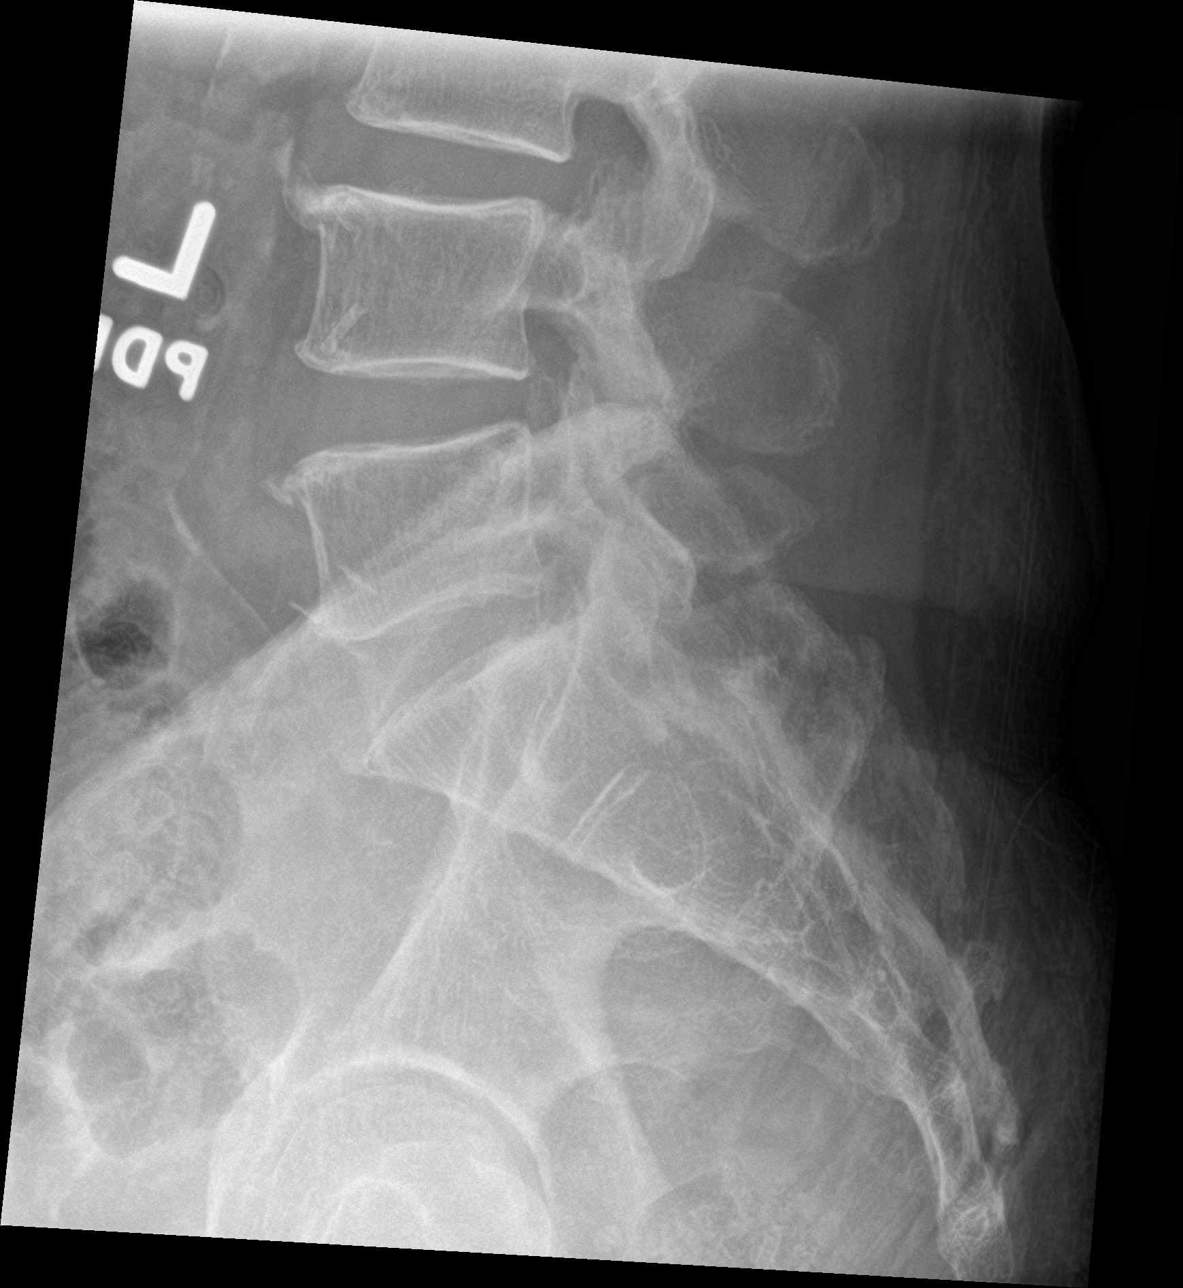

[5 of 5 positions shown; findings below may reference images not displayed]

FINDINGS: There are 5 non-rib-bearing lumbar type vertebral bodies. Vertebral
body heights are preserved. Alignment is normal. There is no
evidence of acute fracture or traumatic malalignment.

The intervertebral disc spaces are preserved. There is mild
multilevel degenerative endplate change and facet arthropathy in the
lumbar spine. There is no spondylolysis.

There is dense calcified atherosclerotic plaque of the abdominal
aorta.
IMPRESSION: Mild multilevel degenerative changes of the lumbar spine.

Aortic Atherosclerosis ([YJ]-[YJ]).

## 2021-03-19 NOTE — Progress Notes (Signed)
Subjective:    Patient ID: Randy [name], male    DOB: [birth year], 80 y.o.   MRN: [id number]  HPI 80 year old male who  has a past medical history of Diabetes mellitus, Hypertension, and OSA (obstructive sleep apnea) (11/24/2016).  He presents to the office today with his wife. He has multiple complaints today   Generalized weakness and fatigue -has been a constant issue of worry for the patient over the last few years.  Work-up in the past has been pretty unremarkable except for low vitamin B12 which had been corrected.  He has also been referred to physical therapy in the past which he completed 2 out of 4 sessions and then discharged himself due to financial limitations.  Thought that some of his generalized weakness and fatigue may have been due to depression and he was started on Celexa.  In the past he has felt that though this has helped his mood but more recently he feels as though it is not helping any longer and would like to come off the medication.  He denies chest pain or shortness of breath but feels as though when he walks short distances he gets fatigued very quickly, per patient "even when I walk to the mailbox it feels like my muscles just get fatigued".  He has had some recent falls where he "get tripped up on my feet".  His wife has a hard time getting him off the floor.  Does not endorse chest pain or shortness of breath and has not had any syncopal episodes. Low back pain, chronic in nature.  Feels as though is getting worse and possibly affecting his gait.  Denies sciatica.  Feels as though when he sits he leans off to the right side due to the low back pain. Would like to be referred to dermatology, reports having sores on his head that will not heal and also neoplasm on the right temple area that he would like removed.   Review of Systems See HPI   Past Medical History:  Diagnosis Date   Diabetes mellitus    Hypertension    OSA (obstructive sleep apnea) 11/24/2016     Social History   Socioeconomic History   Marital status: Married    Spouse name: Not on file   Number of children: Not on file   Years of education: Not on file   Highest education level: Not on file  Occupational History   Occupation: retired  Tobacco Use   Smoking status: Former    Packs/day: 1.00    Years: 30.00    Pack years: 30.00    Types: Cigarettes    Quit date: 07/03/2001    Years since quitting: 19.7   Smokeless tobacco: Former    Types: Chew    Quit date: 07/04/1991  Vaping Use   Vaping Use: Never used  Substance and Sexual Activity   Alcohol use: Yes    Alcohol/week: 3.0 standard drinks    Types: 3 Glasses of wine per week    Comment: daily alcohol wine with dinner   Drug use: No   Sexual activity: Yes  Other Topics Concern   Not on file  Social History Narrative   Retired - managed textile plants    Married       He likes to play golf. He likes to read and work in the yard.       Social Determinants of Health   Financial Resource Strain: Low Risk  Difficulty of Paying Living Expenses: Not hard at all  Food Insecurity: No Food Insecurity   Worried About [place] in the Last Year: Never true   Ran Out of Food in the Last Year: Never true  Transportation Needs: No Transportation Needs   Lack of Transportation (Medical): No   Lack of Transportation (Non-Medical): No  Physical Activity: Insufficiently Active   Days of Exercise per Week: 1 day   Minutes of Exercise per Session: 20 min  Stress: No Stress Concern Present   Feeling of Stress : Not at all  Social Connections: Moderately Isolated   Frequency of Communication with Friends and Family: Three times a week   Frequency of Social Gatherings with Friends and Family: Once a week   Attends Religious Services: Never   Marine scientist or Organizations: No   Attends Archivist Meetings: Never   Marital Status: Married  Human resources officer Violence: Not At Risk   Fear  of Current or Ex-Partner: No   Emotionally Abused: No   Physically Abused: No   Sexually Abused: No    Past Surgical History:  Procedure Laterality Date   CATARACT EXTRACTION, BILATERAL Bilateral 2019   COLONOSCOPY  07/06/2011   Procedure: COLONOSCOPY;  Surgeon: Winfield Cunas., MD;  Location: [facility] ENDOSCOPY;  Service: Endoscopy;  Laterality: N/A;   ESOPHAGOGASTRODUODENOSCOPY  07/05/2011   Procedure: ESOPHAGOGASTRODUODENOSCOPY (EGD);  Surgeon: Landry Dyke, MD;  Location: [facility] ENDOSCOPY;  Service: Endoscopy;  Laterality: N/A;    Family History  Problem Relation Age of Onset   Brain cancer Mother    Hypertension Father    Diabetes Father    Stroke Father     Allergies  Allergen Reactions   Drug Ingredient [Atorvastatin]     Myalgia    Current Outpatient Medications on File Prior to Visit  Medication Sig Dispense Refill   Blood Glucose Calibration (TRUE METRIX LEVEL 1) Low SOLN USE TO CHECK METER PRIOR TO TESTING SUGAR 3 each 0   Blood Glucose Monitoring Suppl (TRUE METRIX AIR GLUCOSE METER) w/Device KIT 1 each by Does not apply route daily. 1 kit 0   citalopram (CELEXA) 10 MG tablet Take 1 tablet (10 mg total) by mouth daily. 90 tablet 1   Cyanocobalamin (VITAMIN B-12 PO) Take 5,000 mcg by mouth daily.     lisinopril-hydrochlorothiazide (ZESTORETIC) 20-12.5 MG tablet TAKE ONE TABLET BY MOUTH DAILY 90 tablet 0   metFORMIN (GLUCOPHAGE) 1000 MG tablet TAKE ONE TABLET BY MOUTH DAILY WITH BREAKFAST 90 tablet 0   TRUE METRIX BLOOD GLUCOSE TEST test strip USE TO TEST BLOOD GLUCOSE TWICE DAILY. 200 each 3   TRUEPLUS LANCETS 33G MISC USE TO CHECK BLOOD SUGAR DAILY AND PRN 300 each 0   VITAMIN D PO Take by mouth daily.     No current facility-administered medications on file prior to visit.    BP 110/60   Pulse (!) 59   Temp 97.8 F (36.6 C) (Oral)   Ht 5' 8"  (1.727 m)   Wt 174 lb (78.9 kg)   SpO2 98%   BMI 26.46 kg/m       Objective:   Physical Exam Vitals and nursing  note reviewed.  Constitutional:      Appearance: Normal appearance.  Cardiovascular:     Rate and Rhythm: Normal rate and regular rhythm.     Pulses: Normal pulses.     Heart sounds: Normal heart sounds.  Pulmonary:     Effort: Pulmonary  effort is normal.     Breath sounds: Normal breath sounds.  Musculoskeletal:        General: Normal range of motion.  Skin:    General: Skin is warm and dry.     Capillary Refill: Capillary refill takes less than 2 seconds.     Findings: Lesion (Notable small lesions on the crown of his head.  Has a benign skin tag on his right temple) present.  Neurological:     General: No focal deficit present.     Mental Status: He is alert and oriented to person, place, and time.     Gait: Gait abnormal (slow somewhat shuffling gait).  Psychiatric:        Mood and Affect: Mood normal.        Behavior: Behavior normal.        Thought Content: Thought content normal.        Judgment: Judgment normal.       Assessment & Plan:  1. Chronic midline low back pain without sciatica  - DG Lumbar Spine Complete; Future  2. Depression, recurrent ([place]) - ok to come off Celexa. Instructions given to him and his wife on how to taper off.   3. Other fatigue - Does not appear to be cardiac or pulmonary related.  Will refer to neurology for further evaluation - Ambulatory referral to Neurology  4. Gait instability  - Ambulatory referral to Neurology  5. Skin neoplasm  - Ambulatory referral to Dermatology  Dorothyann Peng, NP

## 2021-03-19 NOTE — Patient Instructions (Addendum)
I am going to order an xray of your low back for you - You can have this done at [facility] can stop the celexa   I am going to have you wean off   Week 1 - skip one day in between doses Week 2 - skip two days in between doses Week 3 - skip three days between doses Week 4 - stop  I am going to refer you to neurology and

## 2021-03-20 ENCOUNTER — Other Ambulatory Visit: Payer: Self-pay | Admitting: Adult Health

## 2021-03-25 ENCOUNTER — Telehealth: Payer: Self-pay | Admitting: Adult Health

## 2021-03-25 NOTE — Telephone Encounter (Signed)
Patient called to let Tommi Rumps know he has received a call from dermatology but not neurology and wanted to follow up abut that appointment with neurology.   Good callback number is [phone]     Please Advise

## 2021-03-26 NOTE — Telephone Encounter (Signed)
Pt advised that neuro tried to reach out 9/21. Pt was given the phone number to call and schedule the appointment. No further actions needed.

## 2021-04-12 ENCOUNTER — Encounter: Payer: Self-pay | Admitting: Adult Health

## 2021-04-14 NOTE — Telephone Encounter (Signed)
FYI

## 2021-04-19 ENCOUNTER — Ambulatory Visit (INDEPENDENT_AMBULATORY_CARE_PROVIDER_SITE_OTHER): Payer: Medicare PPO | Admitting: Podiatry

## 2021-04-19 ENCOUNTER — Other Ambulatory Visit: Payer: Self-pay

## 2021-04-19 ENCOUNTER — Encounter: Payer: Self-pay | Admitting: Podiatry

## 2021-04-19 DIAGNOSIS — M205X2 Other deformities of toe(s) (acquired), left foot: Secondary | ICD-10-CM | POA: Diagnosis not present

## 2021-04-19 DIAGNOSIS — M205X1 Other deformities of toe(s) (acquired), right foot: Secondary | ICD-10-CM

## 2021-04-19 DIAGNOSIS — E1151 Type 2 diabetes mellitus with diabetic peripheral angiopathy without gangrene: Secondary | ICD-10-CM

## 2021-04-19 DIAGNOSIS — B351 Tinea unguium: Secondary | ICD-10-CM

## 2021-04-19 NOTE — Progress Notes (Signed)
This patient returns to my office for at risk foot care.  This patient requires this care by a professional since this patient will be at risk due to having diabetes.  This patient is unable to cut nails himself since the patient cannot reach his nails.These nails are painful walking and wearing shoes.  This patient presents for at risk foot care today.  General Appearance  Alert, conversant and in no acute stress.  Vascular  Dorsalis pedis and posterior tibial  pulses are palpable  bilaterally.  Capillary return is within normal limits  bilaterally. Temperature is within normal limits  bilaterally.  Neurologic  Senn-Weinstein monofilament wire test diminished  bilaterally. Muscle power within normal limits bilaterally.  Nails Thick disfigured discolored nails with subungual debris  from hallux to fifth toes bilaterally. No evidence of bacterial infection or drainage bilaterally.  Orthopedic  No limitations of motion  feet .  No crepitus or effusions noted.  No bony pathology or digital deformities noted.  Hallux limitus 1st MPJ  B/L.  Midfoot  DJD  B/L.  Skin  normotropic skin with no porokeratosis noted bilaterally.  No signs of infections or ulcers noted.     Onychomycosis  Pain in right toes  Pain in left toes  Consent was obtained for treatment procedures.   Mechanical debridement of nails 1-5  bilaterally performed with a nail nipper.  Filed with dremel without incident.    Return office visit    prn                 Told patient to return for periodic foot care and evaluation due to potential at risk complications.   Gardiner Barefoot DPM

## 2021-04-20 ENCOUNTER — Encounter: Payer: Self-pay | Admitting: Psychiatry

## 2021-04-20 ENCOUNTER — Ambulatory Visit (INDEPENDENT_AMBULATORY_CARE_PROVIDER_SITE_OTHER): Payer: Medicare PPO | Admitting: Psychiatry

## 2021-04-20 VITALS — BP 144/60 | HR 62 | Ht 68.0 in | Wt 175.6 lb

## 2021-04-20 DIAGNOSIS — G629 Polyneuropathy, unspecified: Secondary | ICD-10-CM | POA: Diagnosis not present

## 2021-04-20 DIAGNOSIS — R29898 Other symptoms and signs involving the musculoskeletal system: Secondary | ICD-10-CM

## 2021-04-20 DIAGNOSIS — R2681 Unsteadiness on feet: Secondary | ICD-10-CM

## 2021-04-20 NOTE — Patient Instructions (Addendum)
Plan: MRI of lower back Blood work   Preventing Falls at [facility] are common, often dreaded events in the lives of older people. Aside from the obvious injuries and even death that may result, fall can cause wide-ranging consequences including loss of independence, mental decline, decreased activity and mobility. Younger people are also at risk of falling, especially those with chronic illnesses and fatigue.  Ways to reduce risk for falling   Examine diet and medications. Warm foods and alcohol dilate blood vessels, which can lead to dizziness when standing. Sleep aids, antidepressants and pain medications can also increase the likelihood of a fall.   Get a vision exam. Poor vision, cataracts and glaucoma increase the chances of falling.   Check foot gear. Shoes should fit snugly and have a sturdy, nonskid sole and a broad, low heel   Participate in a physician-approved exercise program to build and maintain muscle strength and improve balance and coordination. Programs that use ankle weights or stretch bands are excellent for muscle-strengthening. Water aerobics programs and low-impact Tai Chi programs have also been shown to improve balance and coordination.   Increase vitamin D intake. Vitamin D improves muscle strength and increases the amount of calcium the body is able to absorb and deposit in bones.  How to prevent falls from common hazards   Floors -- Remove all loose wires, cords, and throw rugs. Minimize clutter. Make sure rugs are anchored and smooth. Keep furniture in its usual place.   Chairs -- Use chairs with straight backs, armrests and firm seats. Add firm cushions to existing pieces to add height.   Bathroom -- Install grab bars and non-skid tape in the tub or shower. Use a bathtub transfer bench or a shower chair with a back support Use an elevated toilet seat and/or safety rails to assist standing from a low surface. Do not use towel racks or bathroom tissue holders to help you  stand.   Lighting -- Make sure halls, stairways, and entrances are well-lit. Install a night light in your bathroom or hallway. Make sure there is a light switch at the top and bottom of the staircase. Turn lights on if you get up in the middle of the night. Make sure lamps or light switches are within reach of the bed if you have to get up during the night.   Kitchen -- Install non-skid rubber mats near the sink and stove. Clean spills immediately. Store frequently used utensils, pots, pans between waist and eye level. This helps prevent reaching and bending. Sit when getting things out of lower cupboards.   Living room / DISH furniture with wide spaces in between, giving enough room to move around. Establish a route through the living room that gives you something to hold onto as you walk.   Stairs -- Make sure treads, rails, and rugs are secure. Install a rail on both sides of the stairs. If stairs are a threat, it might be helpful to arrange most of your activities on the lower level to reduce the number of times you must climb the stairs.   Entrances and doorways -- Install metal handles on the walls adjacent to the doorknobs of all doors to make it more secure as you travel through the doorway.   Tips for maintaining balance   Keep at least one hand free at all times. Try using a backpack or fanny pack to hold things rather than carrying them in your hands. Never carry objects in  both hands when walking as this interferes with keeping your balance.   Attempt to swing both arms from front to back while walking. This might require a conscious effort if Parkinson's disease has diminished your movement. It will, however, help you to maintain balance and posture, and reduce fatigue.   Consciously lift your feet off of the ground when walking. Shuffling and dragging of the feet is a common culprit in losing your balance.   When trying to navigate turns, use a "U" technique of facing forward  and making a wide turn, rather than pivoting sharply.   Try to stand with your feet shoulder-length apart. When your feet are close together for any length of time, you increase your risk of losing your balance and falling.   Do one thing at a time. Don't try to walk and accomplish another task, such as reading or looking around. The decrease in your automatic reflexes complicates motor function, so the less distraction, the better.   Do not wear rubber or gripping soled shoes, they might "catch" on the floor and cause tripping.   Move slowly when changing positions. Use deliberate, concentrated movements and, if needed, use a grab bar or walking aid. Count 15 seconds between each movement. For example, when rising from a seated position, wait 15 seconds after standing to begin walking.   If balance is a continuous problem, you might want to consider a walking aid such as a cane, walking stick, or walker. Once you've mastered walking with help, you might be ready to try it on your own again.

## 2021-04-20 NOTE — Progress Notes (Signed)
[facility]  PATIENT: Randy [name] DOB: 11/04/1940  REFERRING CLINICIAN: Dorothyann Peng, NP HISTORY FROM: self and wife REASON FOR VISIT: weakness, falls   HISTORICAL  CHIEF COMPLAINT:  Chief Complaint  Patient presents with   Generalized weakness, falls    Rm 1 New Pt  wife- Ashok Norris  " 2 years of losing balance, when he falls his legs go numb, general weakness"    HISTORY OF PRESENT ILLNESS:  The patient presents for evaluation of progressive weakness and falls over the past 2 years. States he used to be able to work in the yard for 3-4 hours, but now can only work 15 minutes at a time and some days cannot work at all. Describes generalized fatigue and weakness, which is most prominent in the legs. Denies double vision, muscle pain/stiffness, or cramping. Weakness does not seem worse later in the day.   Wife notices he has begun shuffling when he walks. He will lean to the right while walking because otherwise he has significant lower back pain. He has fallen a few times due to tripping on objects. States legs will go numb when he falls and he will have to sit for a few moments before he can move them again.   Handwriting has become less legible, though not necessarily smaller. No new tremors. Does not act out dreams.  Did 2 sessions of physical therapy and now has exercises which he does at home.  Was found to have a vitamin B12 deficiency and is currently taking supplementation.  Wife notes he has some trouble with his memory. MMSE last year was normal.  OTHER MEDICAL CONDITIONS: DM, HTN, OSA   REVIEW OF SYSTEMS: Full 14 system review of systems performed and negative with exception of: weakness, falls  ALLERGIES: Allergies  Allergen Reactions   Drug Ingredient [Atorvastatin]     Myalgia    HOME MEDICATIONS: Outpatient Medications Prior to Visit  Medication Sig Dispense Refill   Blood Glucose Calibration (TRUE METRIX LEVEL 1) Low SOLN USE TO  CHECK METER PRIOR TO TESTING SUGAR 3 each 0   Blood Glucose Monitoring Suppl (TRUE METRIX AIR GLUCOSE METER) w/Device KIT 1 each by Does not apply route daily. 1 kit 0   citalopram (CELEXA) 10 MG tablet Take 1 tablet (10 mg total) by mouth daily. 90 tablet 1   Cyanocobalamin (VITAMIN B-12 PO) Take 5,000 mcg by mouth daily.     lisinopril-hydrochlorothiazide (ZESTORETIC) 20-12.5 MG tablet TAKE ONE TABLET BY MOUTH DAILY 90 tablet 0   metFORMIN (GLUCOPHAGE) 1000 MG tablet TAKE ONE TABLET BY MOUTH DAILY WITH BREAKFAST 90 tablet 0   TRUE METRIX BLOOD GLUCOSE TEST test strip USE TO TEST BLOOD GLUCOSE TWICE DAILY. 200 each 3   TRUEPLUS LANCETS 33G MISC USE TO CHECK BLOOD SUGAR DAILY AND PRN 300 each 0   VITAMIN D PO Take by mouth daily.     No facility-administered medications prior to visit.    PAST MEDICAL HISTORY: Past Medical History:  Diagnosis Date   Diabetes mellitus    Hypertension    OSA (obstructive sleep apnea) 11/24/2016    PAST SURGICAL HISTORY: Past Surgical History:  Procedure Laterality Date   CATARACT EXTRACTION, BILATERAL Bilateral 2019   COLONOSCOPY  07/06/2011   Procedure: COLONOSCOPY;  Surgeon: Winfield Cunas., MD;  Location[facility] END[facility]COPY;  Service: Endoscopy;  Laterality: N/A;   ESOPHAGOGASTRODUODENOSCOPY  07/05/2011   Procedure: ESOPHAGOGASTRODUODENOSCOPY (EGD);  Surgeon: Landry Dyke, MD;  Location: [facility] ENDOSCOPY;  Service: Endoscopy;  Laterality: N/A;    FAMILY HISTORY: Family History  Problem Relation Age of Onset   Brain cancer Mother    Hypertension Father    Diabetes Father    Stroke Father     SOCIAL HISTORY: Social History   Socioeconomic History   Marital status: Married    Spouse name: Ashok Norris   Number of children: 3   Years of education: Not on file   Highest education level: Not on file  Occupational History   Occupation: retired    Comment: Engineer, building services  Tobacco Use   Smoking status: Former    Packs/day: 1.00    Years: 30.00    Pack  years: 30.00    Types: Cigarettes    Quit date: 07/03/2001    Years since quitting: 19.8   Smokeless tobacco: Former    Types: Chew    Quit date: 07/04/1991  Vaping Use   Vaping Use: Never used  Substance and Sexual Activity   Alcohol use: Yes    Alcohol/week: 3.0 standard drinks    Types: 3 Glasses of wine per week    Comment: daily alcohol wine with dinner, occas scotch 2-3 a night   Drug use: No   Sexual activity: Yes  Other Topics Concern   Not on file  Social History Narrative   Retired - managed textile plants    Married       He likes to play golf. He likes to read and work in the yard.       Social Determinants of Health   Financial Resource Strain: Low Risk    Difficulty of Paying Living Expenses: Not hard at all  Food Insecurity: No Food Insecurity   Worried About Charity fundraiser in the Last Year: Never true   [place] in the Last Year: Never true  Transportation Needs: No Transportation Needs   Lack of Transportation (Medical): No   Lack of Transportation (Non-Medical): No  Physical Activity: Insufficiently Active   Days of Exercise per Week: 1 day   Minutes of Exercise per Session: 20 min  Stress: No Stress Concern Present   Feeling of Stress : Not at all  Social Connections: Moderately Isolated   Frequency of Communication with Friends and Family: Three times a week   Frequency of Social Gatherings with Friends and Family: Once a week   Attends Religious Services: Never   Marine scientist or Organizations: No   Attends Music therapist: Never   Marital Status: Married  Human resources officer Violence: Not At Risk   Fear of Current or Ex-Partner: No   Emotionally Abused: No   Physically Abused: No   Sexually Abused: No     PHYSICAL EXAM  GENERAL EXAM/CONSTITUTIONAL: Vitals:  Vitals:   04/20/21 1244  BP: (!) 144/60  Pulse: 62  Weight: 175 lb 9.6 oz (79.7 kg)  Height: 5' 8"  (1.727 m)   Body mass index is 26.7  kg/m. Wt Readings from Last 3 Encounters:  04/20/21 175 lb 9.6 oz (79.7 kg)  03/19/21 174 lb (78.9 kg)  02/18/21 175 lb (79.4 kg)   Patient is in no distress; well developed, nourished and groomed; neck is supple  CARDIOVASCULAR: Examination of peripheral vascular system by observation and palpation is normal  EYES: Pupils round and reactive to light, Visual fields full to confrontation, Extraocular movements intact. No fatigueable ptosis.  MUSCULOSKELETAL: Gait, strength, tone, movements noted in Neurologic exam below  NEUROLOGIC: MENTAL  STATUS:  MMSE - Mini Mental State Exam 11/21/2019  Orientation to time 5  Orientation to Place 5  Registration 3  Attention/ Calculation 5  Recall 1  Language- name 2 objects 2  Language- repeat 1  Language- follow 3 step command 3  Language- read & follow direction 1  Write a sentence 1  Copy design 1  Total score 28   awake, alert, oriented to person, place and time recent and remote memory intact normal attention and concentration language fluent, comprehension intact, naming intact fund of knowledge appropriate  CRANIAL NERVE:  2nd, 3rd, 4th, 6th - pupils equal and reactive to light, visual fields full to confrontation, extraocular muscles intact, no nystagmus 5th - facial sensation symmetric 7th - facial strength symmetric 8th - hearing intact 9th - palate elevates symmetrically, uvula midline 11th - shoulder shrug symmetric 12th - tongue protrusion midline  MOTOR:  normal bulk and tone, full strength in the BUE, BLE. He is able to go from sitting to standing with arms crossed. No cogwheeling rigidity.  SENSORY:  Diminished sensation to vibration in right foot and ankle, otherwise light touch, pin prick, and proprioception intact  COORDINATION:  finger-nose-finger, fine finger movements and toe tapping normal +Romberg with sway  REFLEXES:  deep tendon reflexes present and symmetric. Downgoing toes  bilaterally  GAIT/STATION:  Shuffling gait, mildly stooped posture     DIAGNOSTIC DATA (LABS, IMAGING, TESTING) - I reviewed patient records, labs, notes, testing and imaging myself where available.  Lab Results  Component Value Date   WBC 6.7 11/03/2020   HGB 13.0 11/03/2020   HCT 39.2 11/03/2020   MCV 97.4 11/03/2020   PLT 176.0 11/03/2020      Component Value Date/Time   NA 138 11/03/2020 1539   K 4.6 11/03/2020 1539   CL 97 11/03/2020 1539   CO2 36 (H) 11/03/2020 1539   GLUCOSE 129 (H) 11/03/2020 1539   BUN 24 (H) 11/03/2020 1539   CREATININE 1.25 11/03/2020 1539   CALCIUM 11.5 (H) 11/03/2020 1539   PROT 6.6 11/03/2020 1539   ALBUMIN 4.4 11/03/2020 1539   AST 21 11/03/2020 1539   ALT 24 11/03/2020 1539   ALKPHOS 61 11/03/2020 1539   BILITOT 0.4 11/03/2020 1539   GFRNONAA >60 02/05/2019 0810   GFRAA >60 02/05/2019 0810   Lab Results  Component Value Date   CHOL 181 12/07/2018   HDL 69.60 12/07/2018   LDLCALC 101 (H) 12/07/2018   TRIG 54.0 12/07/2018   CHOLHDL 3 12/07/2018   Lab Results  Component Value Date   HGBA1C 6.5 11/03/2020   Lab Results  Component Value Date   EKCMKLKJ17 915 08/12/2020   Lab Results  Component Value Date   TSH 2.12 11/21/2019      ASSESSMENT AND PLAN  80 y.o. year old male with a history of DM, HTN, OSA who presents for evaluation of progressive weakness/fatigue most prominent in his lower extremities. He does not have noticeable weakness on his exam today, but does have decreased sensation to vibration in his right lower extremity. Will check labs for causes of large fiber neuropathy and check muscle enzyme levels. MRI L-spine ordered to assess for spinal stenosis as he reports significant back pain associated with his leg symptoms. If testing is negative would consider brain MRI to assess for NPH given history of gait changes and memory issues.   1. Leg weakness, bilateral   2. Neuropathy       PLAN: -Blood work:  IFE, kappa/lamda  light chain, CK, aldolase -MRI L-spine -Next steps: consider brain MRI, EMG  Orders Placed This Encounter  Procedures   MR LUMBAR SPINE WO CONTRAST   Immunofixation Electrophoresis, Serum   Kappa/lambda light chains   CK   Aldolase     Return for after testing.    Genia Harold, MD 04/20/21 1:46 PM   I spent an average of 53 minutes chart reviewing and counseling the patient, with at least 50% of the time face to face with the patient.   [facility] [address] [place], [place] [address] [phone]

## 2021-04-21 ENCOUNTER — Ambulatory Visit (INDEPENDENT_AMBULATORY_CARE_PROVIDER_SITE_OTHER): Payer: Medicare PPO

## 2021-04-21 ENCOUNTER — Other Ambulatory Visit: Payer: Self-pay

## 2021-04-21 DIAGNOSIS — Z23 Encounter for immunization: Secondary | ICD-10-CM | POA: Diagnosis not present

## 2021-04-23 LAB — KAPPA/LAMBDA LIGHT CHAINS
Ig Kappa Free Light Chain: 20.4 mg/L — ABNORMAL HIGH (ref 3.3–19.4)
Ig Lambda Free Light Chain: 14.1 mg/L (ref 5.7–26.3)
KAPPA/LAMBDA RATIO: 1.45 (ref 0.26–1.65)

## 2021-04-23 LAB — IMMUNOFIXATION ELECTROPHORESIS
IgA/Immunoglobulin A, Serum: 165 mg/dL (ref 61–437)
IgG (Immunoglobin G), Serum: 678 mg/dL (ref 603–1613)
IgM (Immunoglobulin M), Srm: 37 mg/dL (ref 15–143)
Total Protein: 6.4 g/dL (ref 6.0–8.5)

## 2021-04-26 ENCOUNTER — Telehealth: Payer: Self-pay

## 2021-04-26 NOTE — Telephone Encounter (Signed)
-----   Message from Genia Harold, MD sent at 04/26/2021  8:21 AM EDT ----- Blood work is normal

## 2021-04-26 NOTE — Telephone Encounter (Signed)
I called the pt and advised of results he verbalized understanding and appreciation for the call.

## 2021-05-04 ENCOUNTER — Other Ambulatory Visit: Payer: Self-pay

## 2021-05-04 ENCOUNTER — Ambulatory Visit
Admission: RE | Admit: 2021-05-04 | Discharge: 2021-05-04 | Disposition: A | Discharge status: Home / Self Care | Payer: Medicare PPO | Source: Ambulatory Visit | Attending: Psychiatry | Admitting: Psychiatry

## 2021-05-04 DIAGNOSIS — M5116 Intervertebral disc disorders with radiculopathy, lumbar region: Secondary | ICD-10-CM | POA: Diagnosis not present

## 2021-05-04 DIAGNOSIS — M4316 Spondylolisthesis, lumbar region: Secondary | ICD-10-CM | POA: Diagnosis not present

## 2021-05-04 DIAGNOSIS — R29898 Other symptoms and signs involving the musculoskeletal system: Secondary | ICD-10-CM

## 2021-05-04 DIAGNOSIS — M48061 Spinal stenosis, lumbar region without neurogenic claudication: Secondary | ICD-10-CM | POA: Diagnosis not present

## 2021-05-04 IMAGING — MR MR LUMBAR SPINE W/O CM
4 of 5 series · 26 of 48 positions shown · non-contrast
Comparison: None.

CLINICAL DATA: Lumbar radiculopathy.  Risk of osteoporosis

EXAM:
MRI LUMBAR SPINE WITHOUT CONTRAST
TECHNIQUE: Multiplanar, multisequence MR imaging of the lumbar spine was
performed. No intravenous contrast was administered.

[Series 3: T2 · sagittal · 4.0mm · 0.59mm/px · 6 of 15 slices shown (1 of 2)]
[im 1/15]
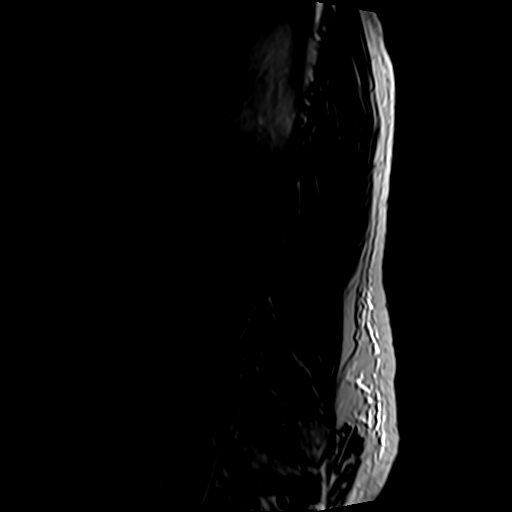
[im 3/15]
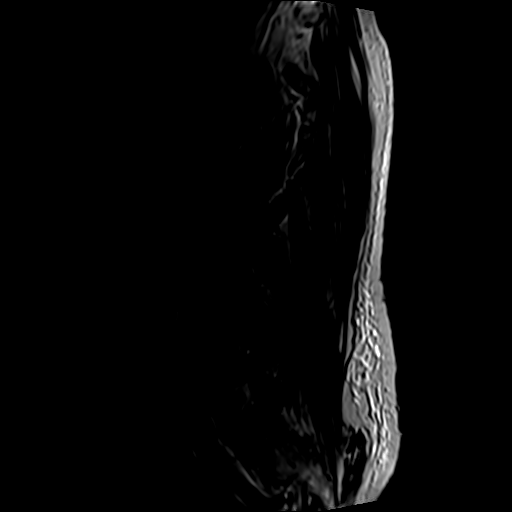
[im 6/15]
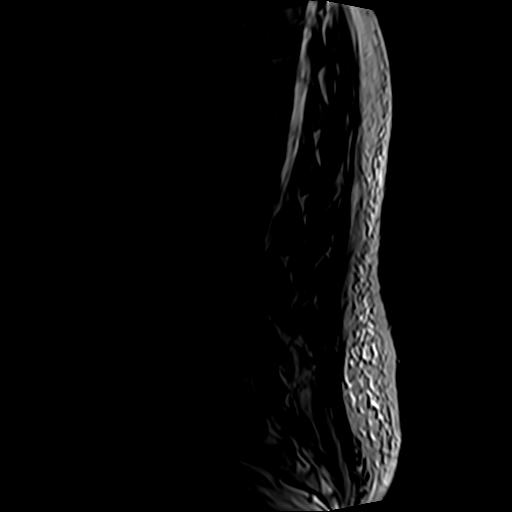
[im 9/15]
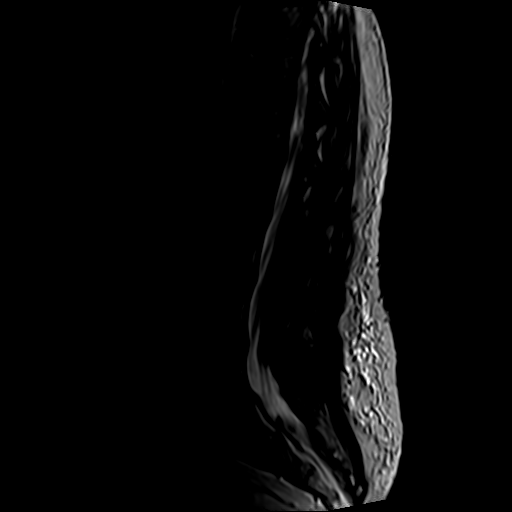
[im 12/15]
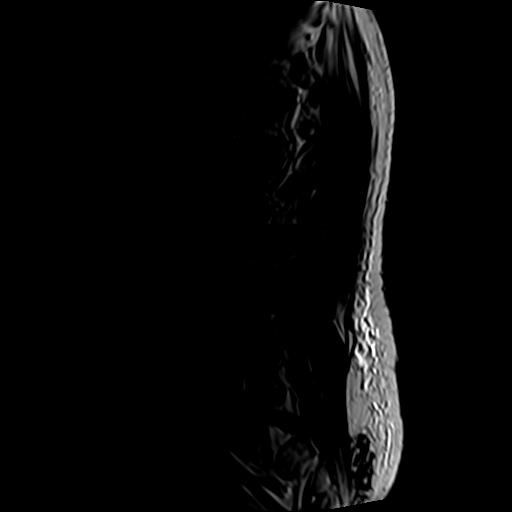
[im 15/15]
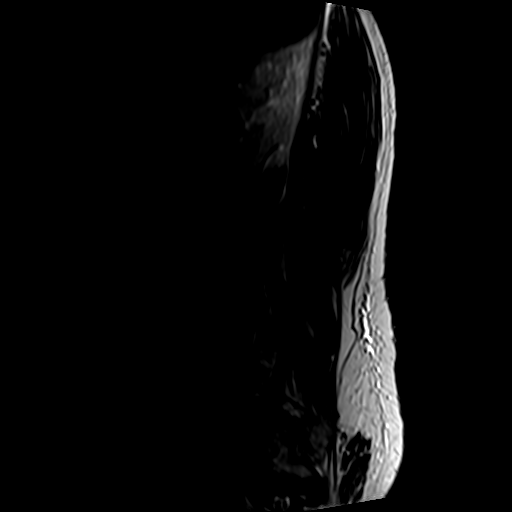

[Series 5: T1 · sagittal · 4.0mm · 0.53mm/px · 6 of 15 slices shown (1 of 2)]
[im 1/15]
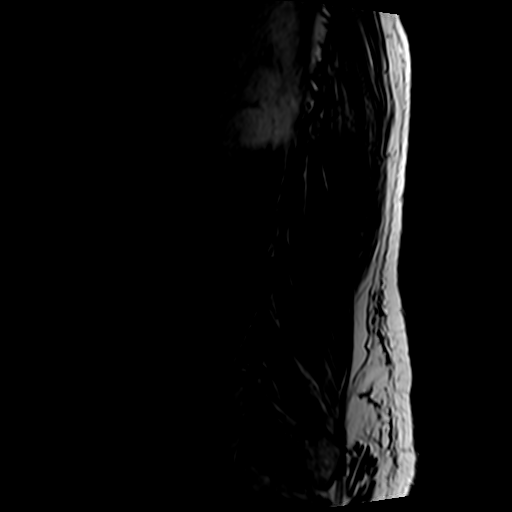
[im 3/15]
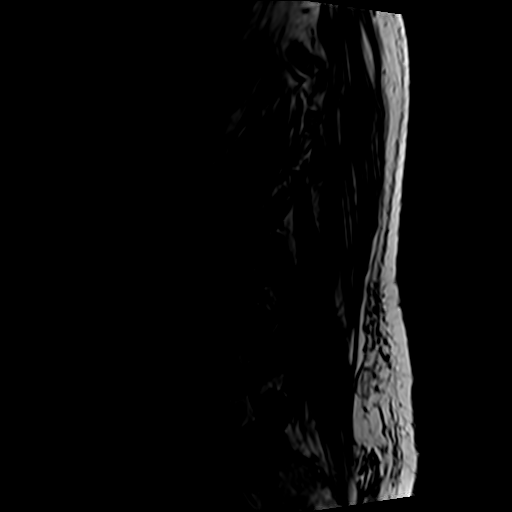
[im 6/15]
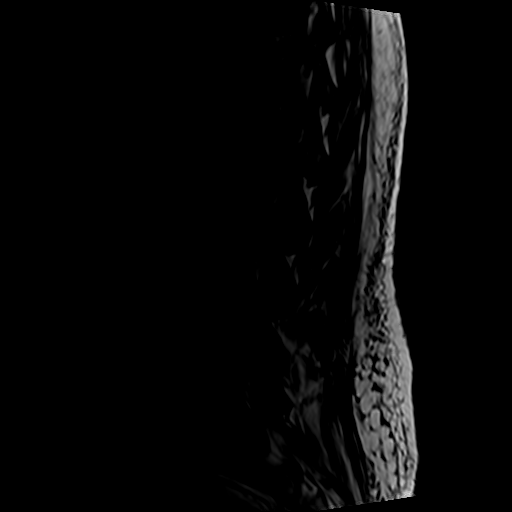
[im 9/15]
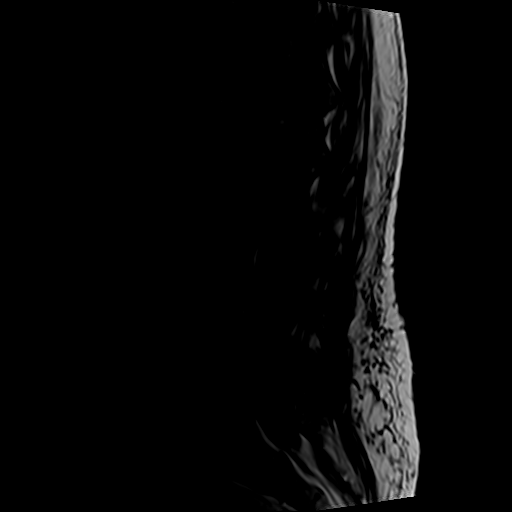
[im 12/15]
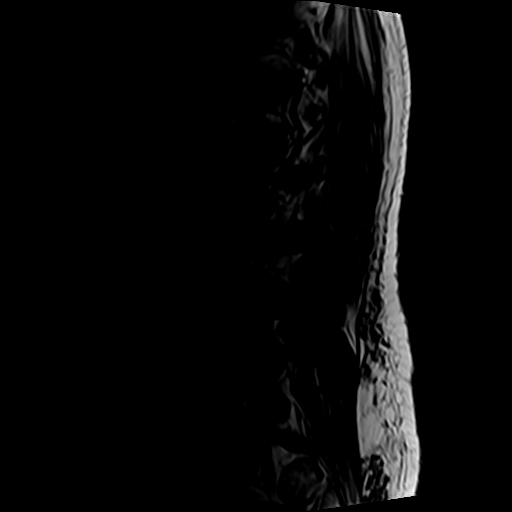
[im 15/15]
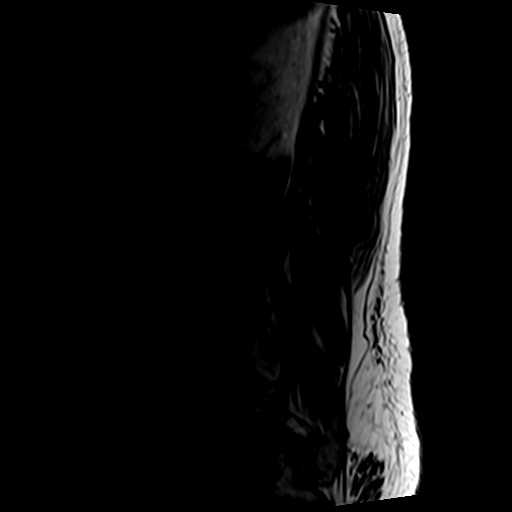

[Series 6: T2 · axial · 4.0mm · 0.70mm/px · z∈[-53,+170]mm · 9 of 40 slices shown (2 of 2)]
[im 1/40]
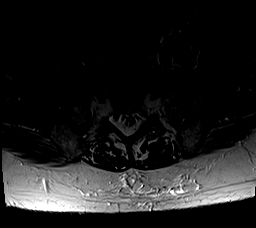
[im 6/40]
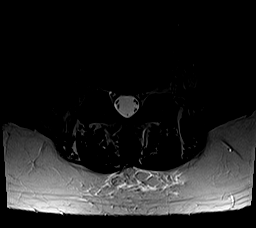
[im 12/40]
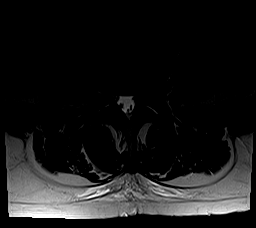
[im 17/40]
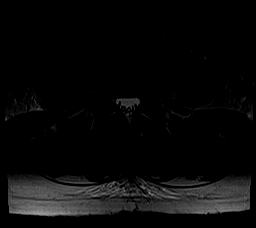
[im 20/40]
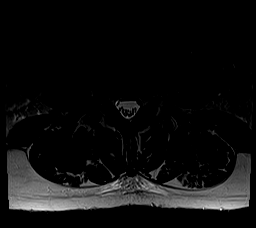
[im 23/40]
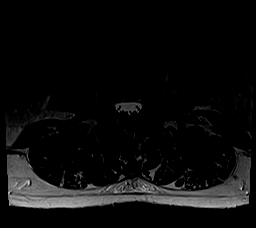
[im 28/40]
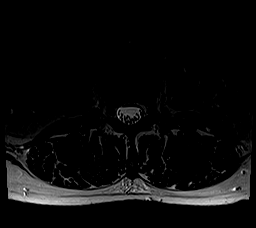
[im 34/40]
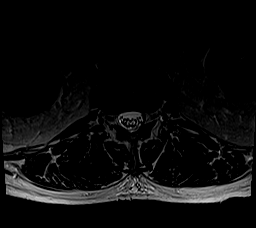
[im 40/40]
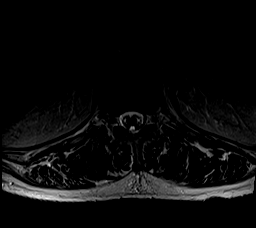

[Series 7: T1 · axial · 4.0mm · 0.35mm/px · z∈[-53,+139]mm · 5 of 40 slices shown (2 of 2)]
[im 1/40]
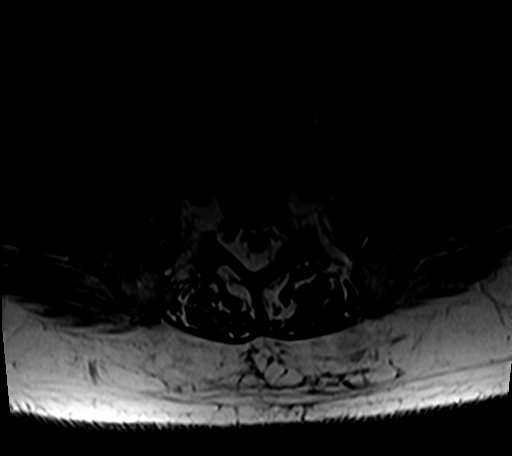
[im 6/40]
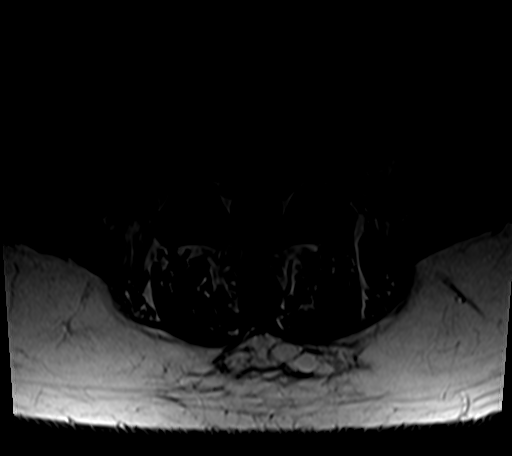
[im 12/40]
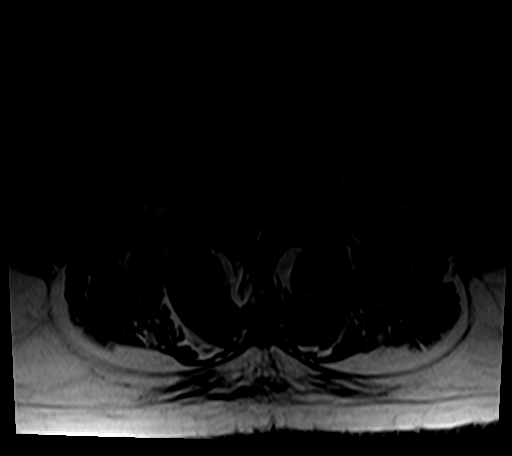
[im 20/40]
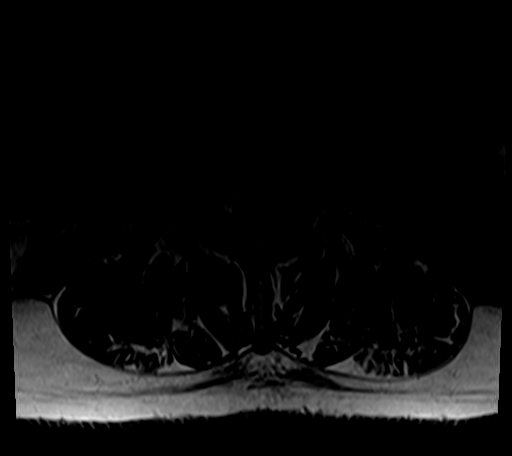
[im 34/40]
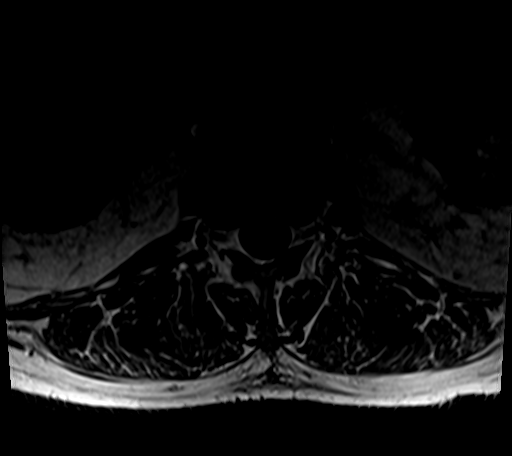

[26 of 48 positions shown; findings below may reference images not displayed]

FINDINGS: Segmentation:  5 lumbar type vertebrae

Alignment:  Slight retrolisthesis at L3-4

Vertebrae:  No fracture, evidence of discitis, or bone lesion.

Conus medullaris and cauda equina: Conus extends to the L1 level.
Conus and cauda equina appear normal.

Paraspinal and other soft tissues: Negative

Disc levels:

T12- L1: Unremarkable.

L1-L2: Mild disc narrowing and ventral spurring

L2-L3: Disc narrowing and bulging. Negative facets. No neural
impingement

L3-L4: Disc narrowing and bulging with facet spurring. No
impingement

L4-L5: Disc narrowing and bulging with left eccentric bulge or
inferior foraminal protrusion. Mild left subarticular recess and
foraminal narrowing.

L5-S1:Unremarkable.
IMPRESSION: Negative for compression fracture or neural compression lumbar spine
degeneration is less than typical for age.

## 2021-05-05 ENCOUNTER — Other Ambulatory Visit: Payer: Self-pay | Admitting: Psychiatry

## 2021-05-05 DIAGNOSIS — R413 Other amnesia: Secondary | ICD-10-CM

## 2021-05-05 DIAGNOSIS — R2689 Other abnormalities of gait and mobility: Secondary | ICD-10-CM

## 2021-05-11 ENCOUNTER — Ambulatory Visit
Admission: RE | Admit: 2021-05-11 | Discharge: 2021-05-11 | Disposition: A | Discharge status: Home / Self Care | Payer: Medicare PPO | Source: Ambulatory Visit | Attending: Psychiatry | Admitting: Psychiatry

## 2021-05-11 DIAGNOSIS — R413 Other amnesia: Secondary | ICD-10-CM

## 2021-05-11 DIAGNOSIS — R2689 Other abnormalities of gait and mobility: Secondary | ICD-10-CM | POA: Diagnosis not present

## 2021-05-11 DIAGNOSIS — R269 Unspecified abnormalities of gait and mobility: Secondary | ICD-10-CM | POA: Diagnosis not present

## 2021-05-11 MED ORDER — GADOBENATE DIMEGLUMINE 529 MG/ML IV SOLN
15.0000 mL | Freq: Once | INTRAVENOUS | Status: AC | PRN
  Administered 2021-05-11: 15 mL via INTRAVENOUS

## 2021-05-17 ENCOUNTER — Encounter: Payer: Self-pay | Admitting: Adult Health

## 2021-05-18 NOTE — Telephone Encounter (Signed)
Would you like for me to schedule pt for a visit to discuss?

## 2021-06-14 ENCOUNTER — Telehealth: Payer: Self-pay | Admitting: Psychiatry

## 2021-06-14 ENCOUNTER — Telehealth: Payer: Self-pay | Admitting: Podiatry

## 2021-06-14 NOTE — Telephone Encounter (Signed)
Pt left message with questions about diabetic shoes.  I returned call and was leaving a message and pt called me back. He is scheduled to see Dr Prudence Davidson and Aaron Edelman on 1.18.2023

## 2021-06-14 NOTE — Telephone Encounter (Signed)
I called the pt back. Pt reports he was advised of his MRI report back in November but wanted to know if he could schedule the 1-2 month f/u as recommended by Dr. Billey Gosling.  I have scheduled for 06/16/2021 at 330 for f/u appt.

## 2021-06-14 NOTE — Telephone Encounter (Signed)
Pt is asking for a call with results to MRI's he has had done

## 2021-06-16 ENCOUNTER — Encounter: Payer: Self-pay | Admitting: Psychiatry

## 2021-06-16 ENCOUNTER — Ambulatory Visit: Payer: Medicare PPO | Admitting: Psychiatry

## 2021-06-16 VITALS — BP 128/86 | HR 55 | Ht 68.0 in | Wt 180.0 lb

## 2021-06-16 DIAGNOSIS — R2689 Other abnormalities of gait and mobility: Secondary | ICD-10-CM

## 2021-06-16 NOTE — Progress Notes (Signed)
° °  CC:  imbalance  Follow-up Visit  Last visit: 04/20/21  Brief HPI: 80 year old male with a history of DM, HTN, and OSA who follows in clinic for balance issues.  At his last visit neuropathy labs and MRI L-spine were ordered.  Interval History: Since his last visit he has continued to have balance issues. [place] since his last visit, was walking to bed and foot hit the bed which caused him to lose balance and fall. Has been getting worse since his last visit. Feels very low energy, just sits in the chair most of the time.  Continues to have some memory issues. Will repeat questions. Seems more anxious, will worry frequently about missing appointments. Feels like he is "in a fog" which is worse than he was a year ago.  Has noticed urinary and bowel incontinence which seems to be getting worse over time.   MRI L-spine and neuropathy labs were normal. MRI brain showed mild-moderate atrophy and mild-moderate ventriculomegaly. Evans index 0.38  Physical Exam:   Vital Signs: BP 128/86    Pulse (!) 55    Ht 5\' 8"  (1.727 m)    Wt 180 lb (81.6 kg)    SpO2 95%    BMI 27.37 kg/m  GENERAL:  well appearing, in no acute distress, alert  SKIN:  Color, texture, turgor normal. No rashes or lesions HEAD:  Normocephalic/atraumatic. RESP: normal respiratory effort MSK:  No gross joint deformities.   NEUROLOGICAL: Mental Status: Alert, oriented to person, place and time, Follows commands, and Speech fluent and appropriate. Cranial Nerves: PERRL, face symmetric, no dysarthria, hearing grossly intact Motor: moves all extremities equally. No tremor, no rigidity Gait: narrow based, shuffling gait  IMPRESSION: 80 year old male with a history of DM, HTN, OSA who presents for follow up of gait instability. MRI brain did show mild-moderate ventriculomegaly in the setting of mild-moderate cerebral atrophy (Evans index 0.38). Discussed how ventriculomegaly can be a normal finding with atrophy, however given  triad of memory changes, urinary incontinence, and gait instability would favor a lumbar drain trial to assess for NPH. He is unsure if he would like to pursue further testing at this time. He will think about it and let me know if he chooses to pursue this.  PLAN: -Consider lumbar drain trial -Encouraged to continue doing PT balance exercises at home  Follow-up: 6 months  I spent a total of 55 minutes on the date of the service chart reviewing and counseling the patient.   Genia Harold, MD 06/16/21 4:20 PM

## 2021-06-19 ENCOUNTER — Other Ambulatory Visit: Payer: Self-pay | Admitting: Adult Health

## 2021-06-23 DIAGNOSIS — E119 Type 2 diabetes mellitus without complications: Secondary | ICD-10-CM | POA: Diagnosis not present

## 2021-06-23 DIAGNOSIS — H35371 Puckering of macula, right eye: Secondary | ICD-10-CM | POA: Diagnosis not present

## 2021-06-23 DIAGNOSIS — H52223 Regular astigmatism, bilateral: Secondary | ICD-10-CM | POA: Diagnosis not present

## 2021-06-23 DIAGNOSIS — H524 Presbyopia: Secondary | ICD-10-CM | POA: Diagnosis not present

## 2021-06-23 DIAGNOSIS — H5201 Hypermetropia, right eye: Secondary | ICD-10-CM | POA: Diagnosis not present

## 2021-06-23 DIAGNOSIS — H35033 Hypertensive retinopathy, bilateral: Secondary | ICD-10-CM | POA: Diagnosis not present

## 2021-06-23 LAB — HM DIABETES EYE EXAM

## 2021-07-21 ENCOUNTER — Ambulatory Visit (INDEPENDENT_AMBULATORY_CARE_PROVIDER_SITE_OTHER): Payer: Medicare PPO | Admitting: Podiatry

## 2021-07-21 ENCOUNTER — Ambulatory Visit: Payer: Medicare PPO

## 2021-07-21 ENCOUNTER — Other Ambulatory Visit: Payer: Self-pay

## 2021-07-21 ENCOUNTER — Encounter: Payer: Self-pay | Admitting: Podiatry

## 2021-07-21 DIAGNOSIS — E1151 Type 2 diabetes mellitus with diabetic peripheral angiopathy without gangrene: Secondary | ICD-10-CM

## 2021-07-21 DIAGNOSIS — M2041 Other hammer toe(s) (acquired), right foot: Secondary | ICD-10-CM

## 2021-07-21 DIAGNOSIS — B351 Tinea unguium: Secondary | ICD-10-CM

## 2021-07-21 NOTE — Progress Notes (Signed)
Discussed financials with patient and patient does not wish to proceed at this time. Plan of care to resume at patient's discretion. All questions answered and concerns addressed.

## 2021-07-21 NOTE — Progress Notes (Signed)
This patient returns to my office for at risk foot care.  This patient requires this care by a professional since this patient will be at risk due to having diabetes.  This patient is unable to cut nails himself since the patient cannot reach his nails.These nails are painful walking and wearing shoes.  This patient presents for at risk foot care today.  General Appearance  Alert, conversant and in no acute stress.  Vascular  Dorsalis pedis and posterior tibial  pulses are palpable  bilaterally.  Capillary return is within normal limits  bilaterally. Temperature is within normal limits  bilaterally.  Neurologic  Senn-Weinstein monofilament wire test diminished  bilaterally. Muscle power within normal limits bilaterally.  Nails Thick disfigured discolored nails with subungual debris  from hallux to fifth toes bilaterally. No evidence of bacterial infection or drainage bilaterally.  Orthopedic  No limitations of motion  feet .  No crepitus or effusions noted.  No bony pathology or digital deformities noted.  Hallux limitus 1st MPJ  B/L.  Midfoot  DJD  B/L.  Skin  normotropic skin with no porokeratosis noted bilaterally.  No signs of infections or ulcers noted.     Onychomycosis  Pain in right toes  Pain in left toes  Consent was obtained for treatment procedures.   Mechanical debridement of nails 1-5  bilaterally performed with a nail nipper.  Filed with dremel without incident.  Patient qualifies for diabetic shoes due to DPN and Hallux limitus  B/L and midfoot DJD.   Return office visit    3 months                 Told patient to return for periodic foot care and evaluation due to potential at risk complications.   Gardiner Barefoot DPM

## 2021-07-29 ENCOUNTER — Inpatient Hospital Stay (HOSPITAL_COMMUNITY): Payer: Medicare PPO

## 2021-07-29 ENCOUNTER — Inpatient Hospital Stay (HOSPITAL_COMMUNITY)
Admission: EM | Admit: 2021-07-29 | Discharge: 2021-08-03 | DRG: 056 | Disposition: A | Discharge status: Home Health | Payer: Medicare PPO | Attending: Student | Admitting: Student

## 2021-07-29 ENCOUNTER — Emergency Department (HOSPITAL_COMMUNITY): Payer: Medicare PPO

## 2021-07-29 ENCOUNTER — Other Ambulatory Visit: Payer: Self-pay

## 2021-07-29 ENCOUNTER — Encounter (HOSPITAL_COMMUNITY): Payer: Self-pay

## 2021-07-29 DIAGNOSIS — W19XXXA Unspecified fall, initial encounter: Secondary | ICD-10-CM | POA: Diagnosis not present

## 2021-07-29 DIAGNOSIS — R0602 Shortness of breath: Secondary | ICD-10-CM | POA: Diagnosis not present

## 2021-07-29 DIAGNOSIS — Z9181 History of falling: Secondary | ICD-10-CM

## 2021-07-29 DIAGNOSIS — Z79899 Other long term (current) drug therapy: Secondary | ICD-10-CM

## 2021-07-29 DIAGNOSIS — E1122 Type 2 diabetes mellitus with diabetic chronic kidney disease: Secondary | ICD-10-CM | POA: Diagnosis present

## 2021-07-29 DIAGNOSIS — G4733 Obstructive sleep apnea (adult) (pediatric): Secondary | ICD-10-CM | POA: Diagnosis present

## 2021-07-29 DIAGNOSIS — R410 Disorientation, unspecified: Secondary | ICD-10-CM | POA: Diagnosis not present

## 2021-07-29 DIAGNOSIS — R4182 Altered mental status, unspecified: Secondary | ICD-10-CM | POA: Diagnosis not present

## 2021-07-29 DIAGNOSIS — R4701 Aphasia: Secondary | ICD-10-CM | POA: Diagnosis not present

## 2021-07-29 DIAGNOSIS — Z9842 Cataract extraction status, left eye: Secondary | ICD-10-CM

## 2021-07-29 DIAGNOSIS — F1010 Alcohol abuse, uncomplicated: Secondary | ICD-10-CM | POA: Diagnosis not present

## 2021-07-29 DIAGNOSIS — E1165 Type 2 diabetes mellitus with hyperglycemia: Secondary | ICD-10-CM | POA: Diagnosis present

## 2021-07-29 DIAGNOSIS — G319 Degenerative disease of nervous system, unspecified: Secondary | ICD-10-CM | POA: Diagnosis present

## 2021-07-29 DIAGNOSIS — G9389 Other specified disorders of brain: Secondary | ICD-10-CM | POA: Diagnosis present

## 2021-07-29 DIAGNOSIS — R509 Fever, unspecified: Secondary | ICD-10-CM | POA: Diagnosis not present

## 2021-07-29 DIAGNOSIS — Y92009 Unspecified place in unspecified non-institutional (private) residence as the place of occurrence of the external cause: Secondary | ICD-10-CM | POA: Diagnosis not present

## 2021-07-29 DIAGNOSIS — Z888 Allergy status to other drugs, medicaments and biological substances status: Secondary | ICD-10-CM

## 2021-07-29 DIAGNOSIS — D72825 Bandemia: Secondary | ICD-10-CM | POA: Diagnosis present

## 2021-07-29 DIAGNOSIS — R4189 Other symptoms and signs involving cognitive functions and awareness: Secondary | ICD-10-CM | POA: Diagnosis present

## 2021-07-29 DIAGNOSIS — R32 Unspecified urinary incontinence: Secondary | ICD-10-CM | POA: Diagnosis present

## 2021-07-29 DIAGNOSIS — G9340 Encephalopathy, unspecified: Secondary | ICD-10-CM | POA: Diagnosis not present

## 2021-07-29 DIAGNOSIS — R441 Visual hallucinations: Secondary | ICD-10-CM | POA: Diagnosis present

## 2021-07-29 DIAGNOSIS — R269 Unspecified abnormalities of gait and mobility: Secondary | ICD-10-CM | POA: Diagnosis not present

## 2021-07-29 DIAGNOSIS — I672 Cerebral atherosclerosis: Secondary | ICD-10-CM | POA: Diagnosis not present

## 2021-07-29 DIAGNOSIS — Z823 Family history of stroke: Secondary | ICD-10-CM

## 2021-07-29 DIAGNOSIS — Z66 Do not resuscitate: Secondary | ICD-10-CM | POA: Diagnosis not present

## 2021-07-29 DIAGNOSIS — Z833 Family history of diabetes mellitus: Secondary | ICD-10-CM | POA: Diagnosis not present

## 2021-07-29 DIAGNOSIS — G928 Other toxic encephalopathy: Secondary | ICD-10-CM | POA: Diagnosis not present

## 2021-07-29 DIAGNOSIS — Y900 Blood alcohol level of less than 20 mg/100 ml: Secondary | ICD-10-CM | POA: Diagnosis present

## 2021-07-29 DIAGNOSIS — I10 Essential (primary) hypertension: Secondary | ICD-10-CM | POA: Diagnosis not present

## 2021-07-29 DIAGNOSIS — Z7189 Other specified counseling: Secondary | ICD-10-CM | POA: Diagnosis not present

## 2021-07-29 DIAGNOSIS — E1129 Type 2 diabetes mellitus with other diabetic kidney complication: Secondary | ICD-10-CM | POA: Diagnosis not present

## 2021-07-29 DIAGNOSIS — Z515 Encounter for palliative care: Secondary | ICD-10-CM

## 2021-07-29 DIAGNOSIS — I129 Hypertensive chronic kidney disease with stage 1 through stage 4 chronic kidney disease, or unspecified chronic kidney disease: Secondary | ICD-10-CM | POA: Diagnosis present

## 2021-07-29 DIAGNOSIS — G9341 Metabolic encephalopathy: Secondary | ICD-10-CM | POA: Diagnosis present

## 2021-07-29 DIAGNOSIS — N1831 Chronic kidney disease, stage 3a: Secondary | ICD-10-CM | POA: Diagnosis present

## 2021-07-29 DIAGNOSIS — Z7984 Long term (current) use of oral hypoglycemic drugs: Secondary | ICD-10-CM | POA: Diagnosis not present

## 2021-07-29 DIAGNOSIS — Z8249 Family history of ischemic heart disease and other diseases of the circulatory system: Secondary | ICD-10-CM

## 2021-07-29 DIAGNOSIS — Z9841 Cataract extraction status, right eye: Secondary | ICD-10-CM

## 2021-07-29 DIAGNOSIS — R5381 Other malaise: Secondary | ICD-10-CM | POA: Diagnosis not present

## 2021-07-29 DIAGNOSIS — Z20822 Contact with and (suspected) exposure to covid-19: Secondary | ICD-10-CM | POA: Diagnosis present

## 2021-07-29 DIAGNOSIS — G912 (Idiopathic) normal pressure hydrocephalus: Secondary | ICD-10-CM | POA: Diagnosis not present

## 2021-07-29 DIAGNOSIS — R531 Weakness: Secondary | ICD-10-CM | POA: Diagnosis not present

## 2021-07-29 DIAGNOSIS — E1169 Type 2 diabetes mellitus with other specified complication: Secondary | ICD-10-CM

## 2021-07-29 DIAGNOSIS — R296 Repeated falls: Secondary | ICD-10-CM | POA: Diagnosis present

## 2021-07-29 DIAGNOSIS — I639 Cerebral infarction, unspecified: Secondary | ICD-10-CM | POA: Diagnosis not present

## 2021-07-29 DIAGNOSIS — Z87891 Personal history of nicotine dependence: Secondary | ICD-10-CM

## 2021-07-29 DIAGNOSIS — R000 Tachycardia, unspecified: Secondary | ICD-10-CM | POA: Diagnosis not present

## 2021-07-29 LAB — CSF CELL COUNT WITH DIFFERENTIAL
RBC Count, CSF: 273 /mm3 — ABNORMAL HIGH
Tube #: 1
WBC, CSF: 0 /mm3 (ref 0–5)

## 2021-07-29 LAB — CBC
HCT: 44.3 % (ref 39.0–52.0)
Hemoglobin: 14.4 g/dL (ref 13.0–17.0)
MCH: 33 pg (ref 26.0–34.0)
MCHC: 32.5 g/dL (ref 30.0–36.0)
MCV: 101.6 fL — ABNORMAL HIGH (ref 80.0–100.0)
Platelets: 159 10*3/uL (ref 150–400)
RBC: 4.36 MIL/uL (ref 4.22–5.81)
RDW: 12.4 % (ref 11.5–15.5)
WBC: 8.3 10*3/uL (ref 4.0–10.5)
nRBC: 0 % (ref 0.0–0.2)

## 2021-07-29 LAB — DIFFERENTIAL
Abs Immature Granulocytes: 0.03 10*3/uL (ref 0.00–0.07)
Basophils Absolute: 0 10*3/uL (ref 0.0–0.1)
Basophils Relative: 1 %
Eosinophils Absolute: 0.3 10*3/uL (ref 0.0–0.5)
Eosinophils Relative: 3 %
Immature Granulocytes: 0 %
Lymphocytes Relative: 19 %
Lymphs Abs: 1.5 10*3/uL (ref 0.7–4.0)
Monocytes Absolute: 0.9 10*3/uL (ref 0.1–1.0)
Monocytes Relative: 11 %
Neutro Abs: 5.5 10*3/uL (ref 1.7–7.7)
Neutrophils Relative %: 66 %

## 2021-07-29 LAB — RAPID URINE DRUG SCREEN, HOSP PERFORMED
Amphetamines: NOT DETECTED
Barbiturates: NOT DETECTED
Benzodiazepines: NOT DETECTED
Cocaine: NOT DETECTED
Opiates: NOT DETECTED
Tetrahydrocannabinol: NOT DETECTED

## 2021-07-29 LAB — I-STAT CHEM 8, ED
BUN: 24 mg/dL — ABNORMAL HIGH (ref 8–23)
Calcium, Ion: 1.28 mmol/L (ref 1.15–1.40)
Chloride: 95 mmol/L — ABNORMAL LOW (ref 98–111)
Creatinine, Ser: 1.4 mg/dL — ABNORMAL HIGH (ref 0.61–1.24)
Glucose, Bld: 121 mg/dL — ABNORMAL HIGH (ref 70–99)
HCT: 46 % (ref 39.0–52.0)
Hemoglobin: 15.6 g/dL (ref 13.0–17.0)
Potassium: 4.1 mmol/L (ref 3.5–5.1)
Sodium: 135 mmol/L (ref 135–145)
TCO2: 34 mmol/L — ABNORMAL HIGH (ref 22–32)

## 2021-07-29 LAB — HEMOGLOBIN A1C
Hgb A1c MFr Bld: 6 % — ABNORMAL HIGH (ref 4.8–5.6)
Mean Plasma Glucose: 125.5 mg/dL

## 2021-07-29 LAB — COMPREHENSIVE METABOLIC PANEL
ALT: 15 U/L (ref 0–44)
AST: 18 U/L (ref 15–41)
Albumin: 3.9 g/dL (ref 3.5–5.0)
Alkaline Phosphatase: 65 U/L (ref 38–126)
Anion gap: 10 (ref 5–15)
BUN: 21 mg/dL (ref 8–23)
CO2: 32 mmol/L (ref 22–32)
Calcium: 10.8 mg/dL — ABNORMAL HIGH (ref 8.9–10.3)
Chloride: 95 mmol/L — ABNORMAL LOW (ref 98–111)
Creatinine, Ser: 1.38 mg/dL — ABNORMAL HIGH (ref 0.61–1.24)
GFR, Estimated: 52 mL/min — ABNORMAL LOW (ref >60)
Glucose, Bld: 129 mg/dL — ABNORMAL HIGH (ref 70–99)
Potassium: 4.1 mmol/L (ref 3.5–5.1)
Sodium: 137 mmol/L (ref 135–145)
Total Bilirubin: 1 mg/dL (ref 0.3–1.2)
Total Protein: 6.4 g/dL — ABNORMAL LOW (ref 6.5–8.1)

## 2021-07-29 LAB — URINALYSIS, ROUTINE W REFLEX MICROSCOPIC
Bacteria, UA: NONE SEEN
Bilirubin Urine: NEGATIVE
Glucose, UA: NEGATIVE mg/dL
Ketones, ur: 5 mg/dL — AB
Leukocytes,Ua: NEGATIVE
Nitrite: NEGATIVE
Protein, ur: NEGATIVE mg/dL
RBC / HPF: >50 RBC/hpf — ABNORMAL HIGH (ref 0–5)
Specific Gravity, Urine: 1.008 (ref 1.005–1.030)
pH: 9 — ABNORMAL HIGH (ref 5.0–8.0)

## 2021-07-29 LAB — LACTIC ACID, PLASMA
Lactic Acid, Venous: 1.4 mmol/L (ref 0.5–1.9)
Lactic Acid, Venous: 1.3 mmol/L (ref 0.5–1.9)

## 2021-07-29 LAB — SEDIMENTATION RATE: Sed Rate: 12 mm/hr (ref 0–16)

## 2021-07-29 LAB — PROTEIN AND GLUCOSE, CSF
Glucose, CSF: 76 mg/dL — ABNORMAL HIGH (ref 40–70)
Total  Protein, CSF: 54 mg/dL — ABNORMAL HIGH (ref 15–45)

## 2021-07-29 LAB — PROTIME-INR
INR: 0.9 (ref 0.8–1.2)
Prothrombin Time: 11.6 seconds (ref 11.4–15.2)

## 2021-07-29 LAB — GLUCOSE, CAPILLARY
Glucose-Capillary: 181 mg/dL — ABNORMAL HIGH (ref 70–99)
Glucose-Capillary: 196 mg/dL — ABNORMAL HIGH (ref 70–99)
Glucose-Capillary: 161 mg/dL — ABNORMAL HIGH (ref 70–99)

## 2021-07-29 LAB — RESP PANEL BY RT-PCR (FLU A&B, COVID) ARPGX2
Influenza A by PCR: NEGATIVE
Influenza B by PCR: NEGATIVE
SARS Coronavirus 2 by RT PCR: NEGATIVE

## 2021-07-29 LAB — APTT: aPTT: 23 seconds — ABNORMAL LOW (ref 24–36)

## 2021-07-29 LAB — PROCALCITONIN: Procalcitonin: <0.1 ng/mL

## 2021-07-29 LAB — CK: Total CK: 99 U/L (ref 49–397)

## 2021-07-29 LAB — MRSA NEXT GEN BY PCR, NASAL: MRSA by PCR Next Gen: NOT DETECTED

## 2021-07-29 LAB — ETHANOL: Alcohol, Ethyl (B): <10 mg/dL (ref <10)

## 2021-07-29 IMAGING — CT CT HEAD CODE STROKE
3 of 4 series · 15 of 47 positions shown, 18 images · non-contrast
Comparison: MRI [DATE]

CLINICAL DATA: Code stroke. Neuro deficit, acute, stroke suspected.



[Series 5: head 3.0 cor st · coronal · 0.34mm/px · 3 of 73 slices shown]
[im 25/73  brain]
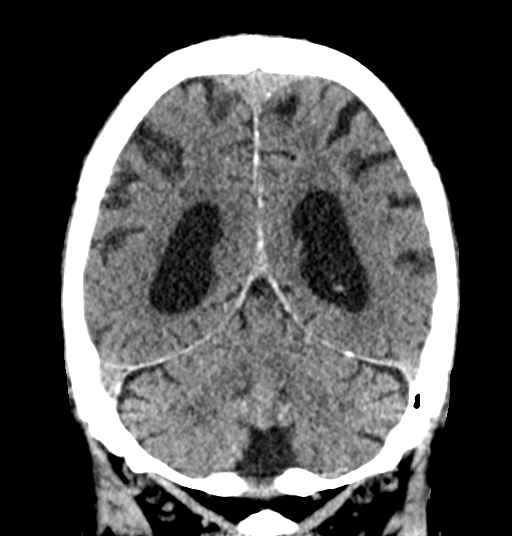
[im 33/73  brain]
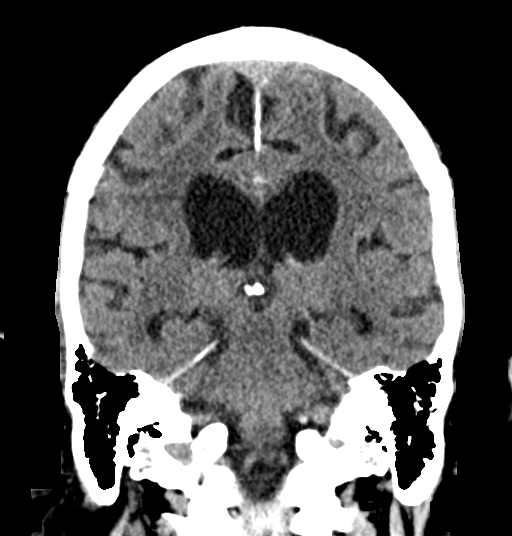
[im 41/73  brain]
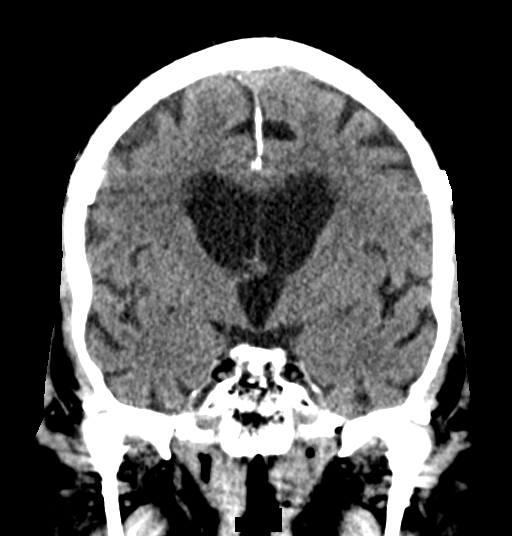

[Series 6: head 3.0 sag st · sagittal · 0.36mm/px · 3 of 57 slices shown]
[im 19/57  brain]
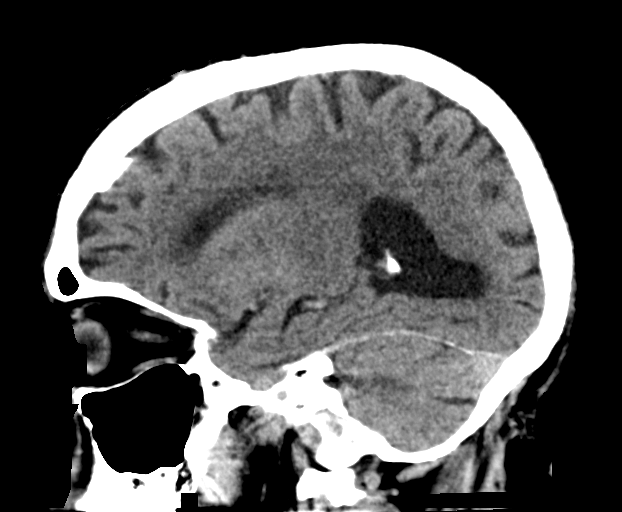
[im 29/57  brain]
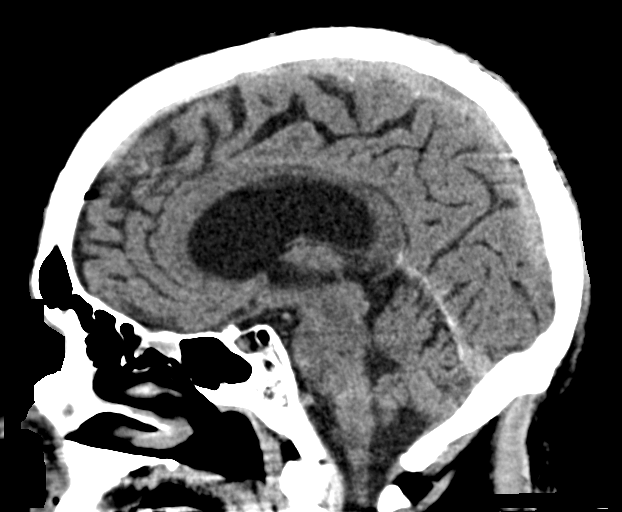
[im 38/57  brain]
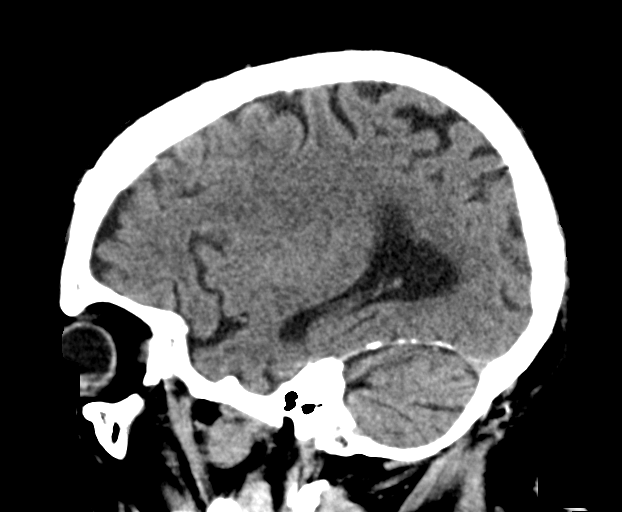

[Series 7: head 5.0 st · axial · 0.44mm/px · z∈[-98,+62]mm · 9 of 38 slices shown, 12 images]
[im 3/38  brain]
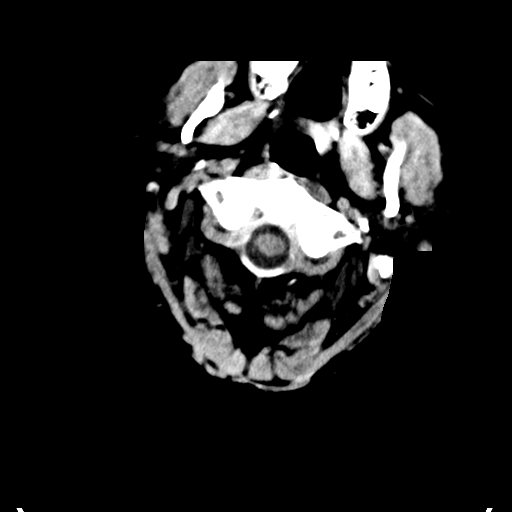
[im 3/38  bone]
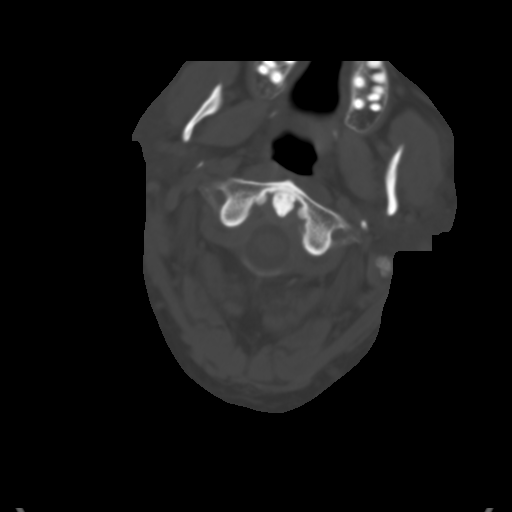
[im 8/38  brain]
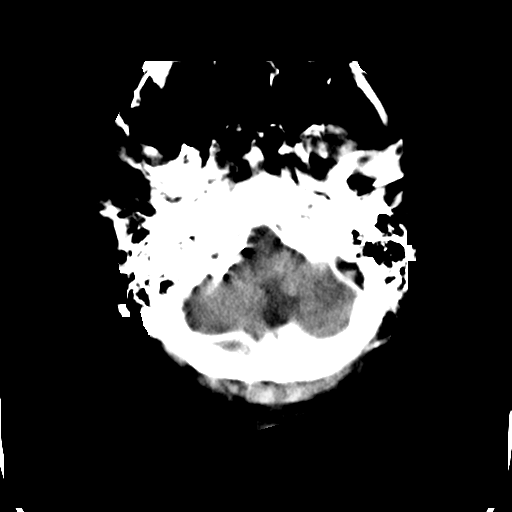
[im 11/38  brain]
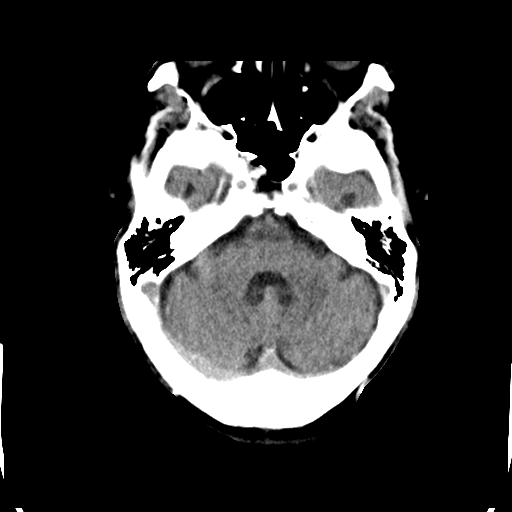
[im 16/38  brain]
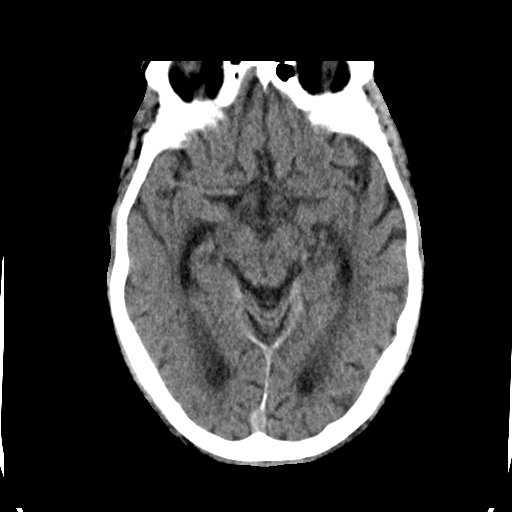
[im 19/38  brain]
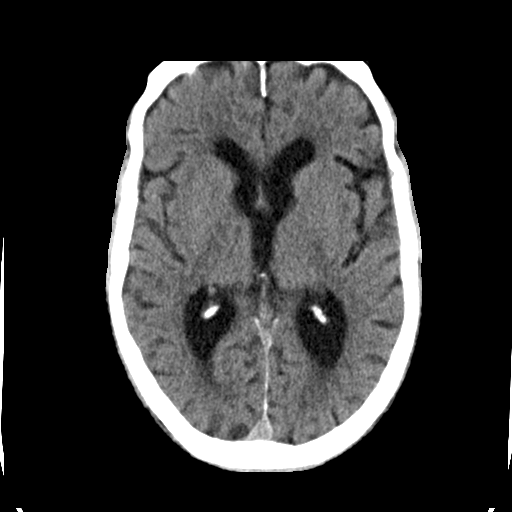
[im 19/38  bone]
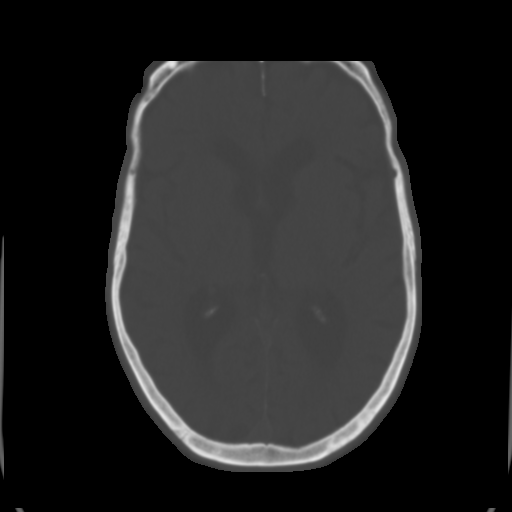
[im 22/38  brain]
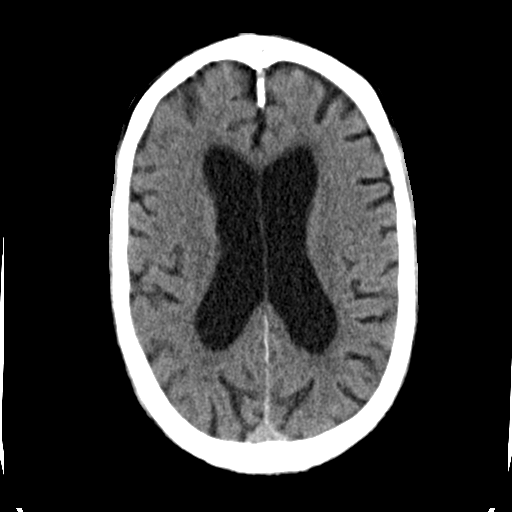
[im 27/38  brain]
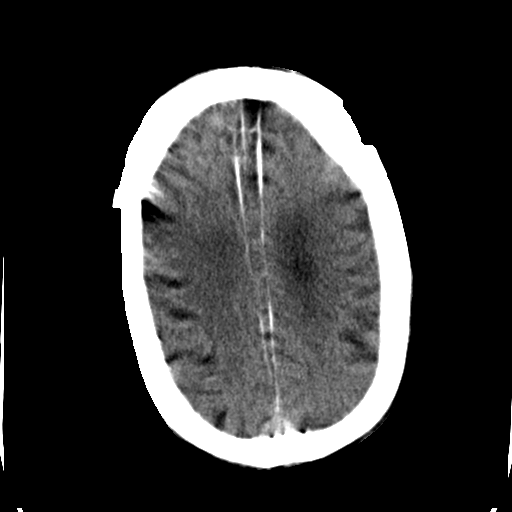
[im 30/38  brain]
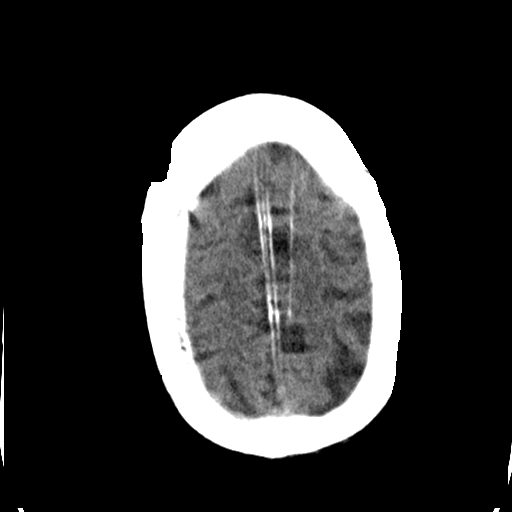
[im 35/38  brain]
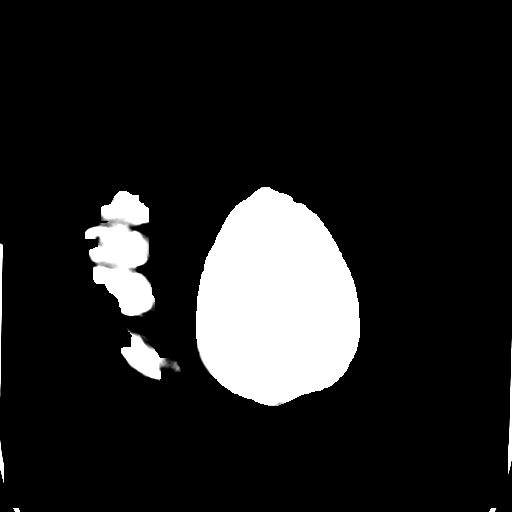
[im 35/38  bone]
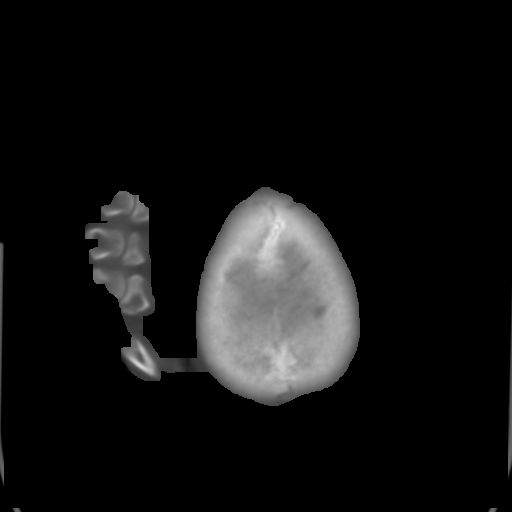

[15 of 47 positions shown; findings below may reference images not displayed]

FINDINGS: Brain: Background pattern of age related volume loss. No focal
abnormality seen affecting the brainstem or cerebellum. Cerebral
hemispheres show mild chronic small-vessel ischemic changes of the
white matter. No sign of cortical or large vessel territory
infarction, mass, hemorrhage, hydrocephalus or extra-axial
collection.

Vascular: There is atherosclerotic calcification of the major
vessels at the base of the brain.

Skull: Negative

Sinuses/Orbits: Clear/normal

Other: None

ASPECTS ([place] Stroke Program Early CT Score)

- Ganglionic level infarction (caudate, lentiform nuclei, internal
capsule, insula, M1-M3 cortex): 7

- Supraganglionic infarction (M4-M6 cortex): 3

Total score (0-10 with 10 being normal): 10
IMPRESSION: 1. No acute CT finding. Age related atrophy. Mild chronic
small-vessel ischemic change of the white matter.
2. ASPECTS is 10.
3. These results were communicated to Dr. LEXIM at [DATE] on
[DATE] by text page via the AMION messaging system.

## 2021-07-29 IMAGING — DX DG CHEST 1V PORT
1 series · 1 of 1 positions shown · non-contrast
Comparison: [DATE]

CLINICAL DATA: Stroke, acute encephalopathy.  Shortness of breath.

EXAM:
PORTABLE CHEST 1 VIEW

[chest ap]
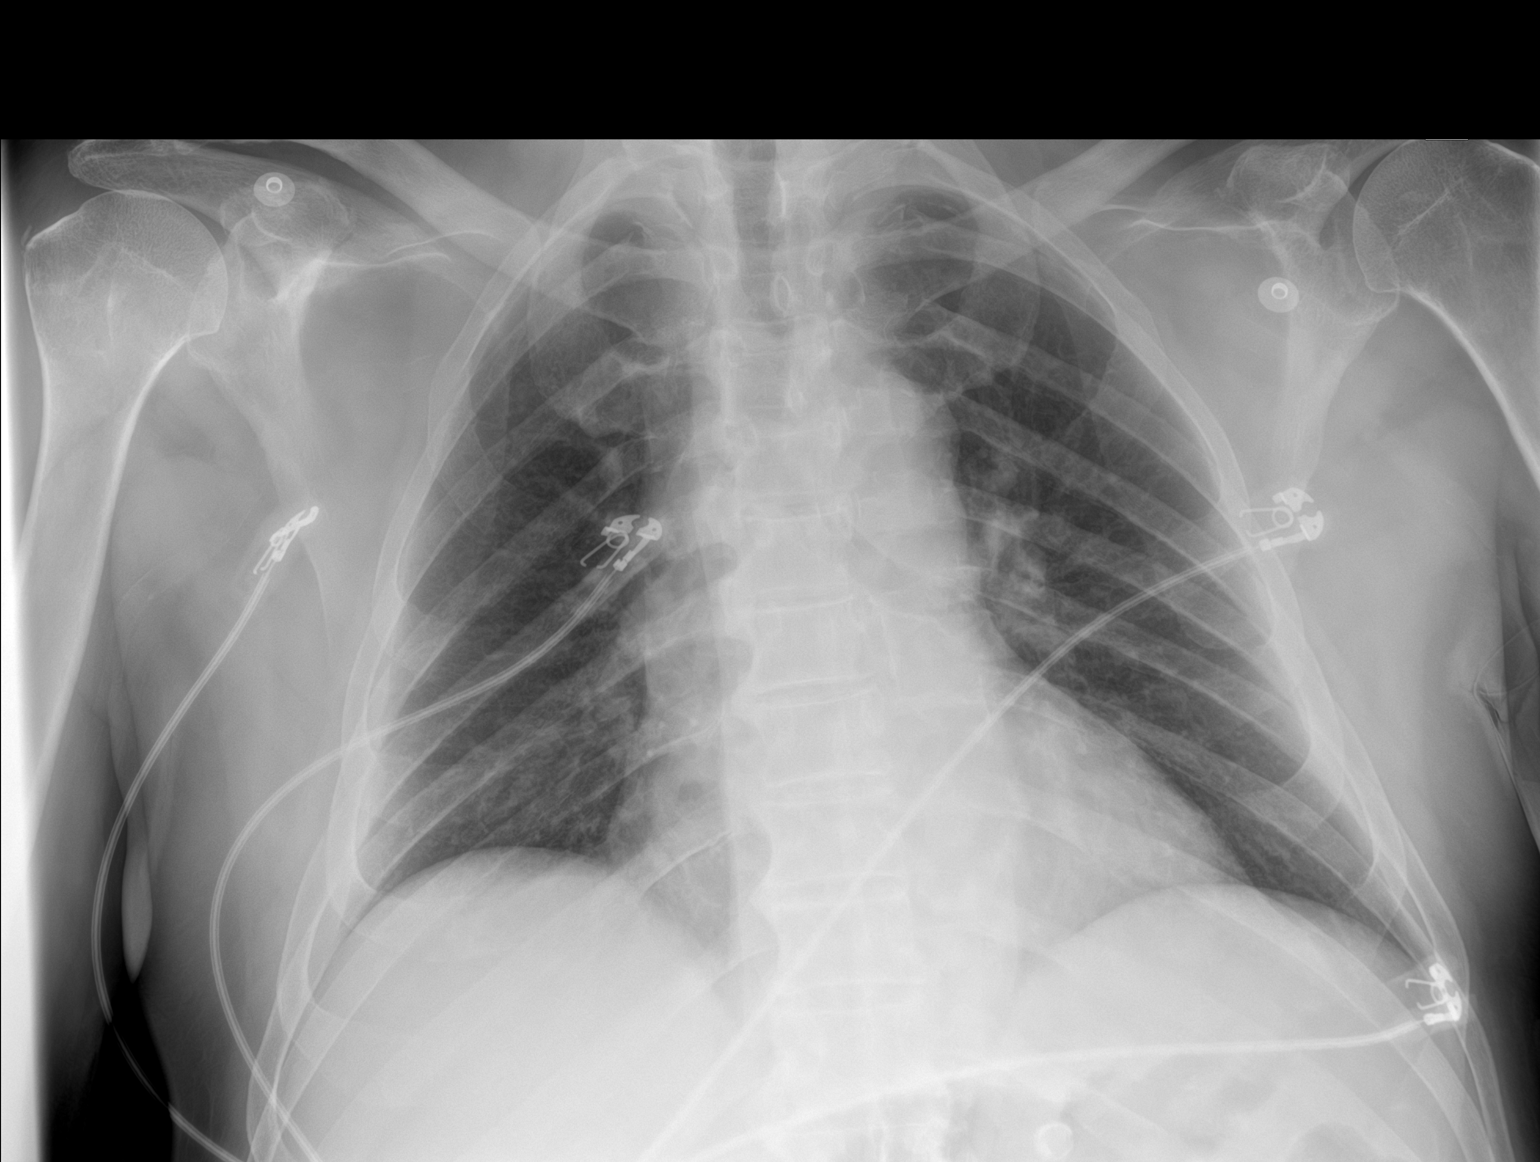

[1 of 1 positions shown; findings below may reference images not displayed]

FINDINGS: The heart size and mediastinal contours are within normal limits.
Both lungs are clear. The visualized skeletal structures are
unremarkable.
IMPRESSION: Normal study.

## 2021-07-29 MED ORDER — SODIUM CHLORIDE 0.9 % IV SOLN
2.0000 g | Freq: Two times a day (BID) | INTRAVENOUS | Status: DC
  Administered 2021-07-29: 2 g via INTRAVENOUS
  Filled 2021-07-29: qty 20

## 2021-07-29 MED ORDER — SODIUM CHLORIDE 0.9 % IV SOLN
3.0000 g | Freq: Four times a day (QID) | INTRAVENOUS | Status: DC
  Filled 2021-07-29 (×3): qty 8

## 2021-07-29 MED ORDER — INSULIN ASPART 100 UNIT/ML IJ SOLN
0.0000 [IU] | INTRAMUSCULAR | Status: DC
  Administered 2021-07-29 (×2): 2 [IU] via SUBCUTANEOUS
  Administered 2021-07-30: 5 [IU] via SUBCUTANEOUS
  Administered 2021-07-30: 2 [IU] via SUBCUTANEOUS
  Administered 2021-07-30: 3 [IU] via SUBCUTANEOUS
  Administered 2021-07-30 (×2): 2 [IU] via SUBCUTANEOUS
  Administered 2021-07-31: 1 [IU] via SUBCUTANEOUS
  Administered 2021-07-31 (×4): 2 [IU] via SUBCUTANEOUS
  Administered 2021-07-31: 1 [IU] via SUBCUTANEOUS
  Administered 2021-08-01: 2 [IU] via SUBCUTANEOUS
  Administered 2021-08-01 (×2): 1 [IU] via SUBCUTANEOUS

## 2021-07-29 MED ORDER — SODIUM CHLORIDE 0.9 % IV SOLN: 3.0000 g | Freq: Four times a day (QID) | INTRAVENOUS | Status: DC

## 2021-07-29 MED ORDER — LACTATED RINGERS IV SOLN: INTRAVENOUS | Status: DC

## 2021-07-29 MED ORDER — ACETAMINOPHEN 325 MG PO TABS: 650.0000 mg | ORAL_TABLET | Freq: Once | ORAL | Status: DC

## 2021-07-29 MED ORDER — LACTATED RINGERS IV SOLN
INTRAVENOUS | Status: DC
Start: 2021-07-29 — End: 2021-07-29

## 2021-07-29 MED ORDER — DEXTROSE 5 % IV SOLN
10.0000 mg/kg | Freq: Two times a day (BID) | INTRAVENOUS | Status: DC
  Administered 2021-07-29 – 2021-07-31 (×5): 790 mg via INTRAVENOUS
  Filled 2021-07-29 (×7): qty 15.8

## 2021-07-29 MED ORDER — HALOPERIDOL LACTATE 5 MG/ML IJ SOLN
INTRAMUSCULAR | Status: AC
  Filled 2021-07-29: qty 1

## 2021-07-29 MED ORDER — ONDANSETRON HCL 4 MG/2ML IJ SOLN: 4.0000 mg | Freq: Four times a day (QID) | INTRAMUSCULAR | Status: DC | PRN

## 2021-07-29 MED ORDER — SODIUM CHLORIDE 0.9 % IV SOLN
2.0000 g | Freq: Four times a day (QID) | INTRAVENOUS | Status: DC
  Administered 2021-07-29 – 2021-07-30 (×3): 2 g via INTRAVENOUS
  Filled 2021-07-29 (×4): qty 2000

## 2021-07-29 MED ORDER — VANCOMYCIN HCL IN DEXTROSE 1-5 GM/200ML-% IV SOLN: 1000.0000 mg | INTRAVENOUS | Status: DC

## 2021-07-29 MED ORDER — METOPROLOL TARTRATE 5 MG/5ML IV SOLN
2.5000 mg | INTRAVENOUS | Status: DC | PRN
  Administered 2021-07-29 – 2021-07-30 (×2): 2.5 mg via INTRAVENOUS
  Administered 2021-07-31: 5 mg via INTRAVENOUS
  Administered 2021-07-31: 2.5 mg via INTRAVENOUS
  Filled 2021-07-29 (×3): qty 5

## 2021-07-29 MED ORDER — DEXAMETHASONE SODIUM PHOSPHATE 10 MG/ML IJ SOLN
10.0000 mg | Freq: Four times a day (QID) | INTRAMUSCULAR | Status: DC
Start: 2021-07-29 — End: 2021-07-30
  Administered 2021-07-29 – 2021-07-30 (×3): 10 mg via INTRAVENOUS
  Filled 2021-07-29 (×3): qty 1

## 2021-07-29 MED ORDER — HEPARIN SODIUM (PORCINE) 5000 UNIT/ML IJ SOLN
5000.0000 [IU] | Freq: Three times a day (TID) | INTRAMUSCULAR | Status: DC
  Administered 2021-07-29 – 2021-08-03 (×15): 5000 [IU] via SUBCUTANEOUS
  Filled 2021-07-29 (×15): qty 1

## 2021-07-29 MED ORDER — VANCOMYCIN HCL 1500 MG/300ML IV SOLN
1500.0000 mg | Freq: Once | INTRAVENOUS | Status: AC
  Administered 2021-07-29: 1500 mg via INTRAVENOUS
  Filled 2021-07-29: qty 300

## 2021-07-29 MED ORDER — HALOPERIDOL LACTATE 5 MG/ML IJ SOLN
5.0000 mg | Freq: Once | INTRAMUSCULAR | Status: AC
  Administered 2021-07-29: 5 mg via INTRAVENOUS
  Filled 2021-07-29: qty 1

## 2021-07-29 MED ORDER — SODIUM CHLORIDE 0.9 % IV SOLN: 2.0000 g | INTRAVENOUS | Status: DC

## 2021-07-29 MED ORDER — ACETAMINOPHEN 325 MG PO TABS
650.0000 mg | ORAL_TABLET | ORAL | Status: DC | PRN
  Administered 2021-08-03: 650 mg via ORAL
  Filled 2021-07-29: qty 2

## 2021-07-29 MED ORDER — ACETAMINOPHEN 650 MG RE SUPP
650.0000 mg | Freq: Once | RECTAL | Status: AC
  Administered 2021-07-29: 650 mg via RECTAL
  Filled 2021-07-29: qty 1

## 2021-07-29 MED ORDER — POLYETHYLENE GLYCOL 3350 17 G PO PACK: 17.0000 g | PACK | Freq: Every day | ORAL | Status: DC | PRN

## 2021-07-29 NOTE — ED Provider Notes (Signed)
[facility] Provider Note   CSN: [id number] Arrival date & time: 07/29/21  1336  An emergency department physician performed an initial assessment on this suspected stroke patient at 1336.  History  Chief Complaint  Patient presents with   Code Stroke    Randy [name] is a 81 y.o. male.  HPI Patient presents for evaluation of confusional state which was noticed by his wife today around 18 AM.  She states she had not seen him much earlier today, but felt he was acutely confused at that time.  She states that yesterday he was able to talk and explain things to her and today he cannot even recognize her.  He has been having general problems for several months to years including being tired, being weak, and then over the last 2 weeks has complained of low back pain.  During this time he is also noted to have some urinary and fecal incontinence which is unusual.  Patient is unable to give any history.    Home Medications Prior to Admission medications   Medication Sig Start Date End Date Taking? Authorizing Provider  Blood Glucose Calibration (TRUE METRIX LEVEL 1) Low SOLN USE TO CHECK METER PRIOR TO TESTING SUGAR 11/09/17   Marletta Lor, MD  Blood Glucose Monitoring Suppl (TRUE METRIX AIR GLUCOSE METER) w/Device KIT 1 each by Does not apply route daily. 11/09/17   Marletta Lor, MD  citalopram (CELEXA) 10 MG tablet Take 1 tablet (10 mg total) by mouth daily. 11/06/20   Nafziger, Tommi Rumps, NP  Cyanocobalamin (VITAMIN B-12 PO) Take 5,000 mcg by mouth daily.    [provider]  lisinopril-hydrochlorothiazide (ZESTORETIC) 20-12.5 MG tablet TAKE ONE TABLET BY MOUTH DAILY 06/23/21   Nafziger, Tommi Rumps, NP  metFORMIN (GLUCOPHAGE) 1000 MG tablet TAKE ONE TABLET BY MOUTH DAILY WITH BREAKFAST 06/23/21   Nafziger, Tommi Rumps, NP  TRUE METRIX BLOOD GLUCOSE TEST test strip USE TO TEST BLOOD GLUCOSE TWICE DAILY. 06/06/19   Nafziger, Tommi Rumps, NP  TRUEPLUS LANCETS  33G MISC USE TO CHECK BLOOD SUGAR DAILY AND PRN 11/09/17   Marletta Lor, MD  VITAMIN D PO Take by mouth daily.    [provider]      Allergies    Drug ingredient [atorvastatin]    Review of Systems   Review of Systems  Physical Exam Updated Vital Signs BP (!) 183/84    Pulse 75    Temp (!) 100.4 F (38 C) (Rectal)    Resp 18    Wt 79 kg    SpO2 99%    BMI 26.48 kg/m  Physical Exam Vitals and nursing note reviewed.  Constitutional:      General: He is in acute distress.     Appearance: He is well-developed. He is ill-appearing. He is not toxic-appearing or diaphoretic.  HENT:     Head: Normocephalic and atraumatic.     Right Ear: External ear normal.     Left Ear: External ear normal.  Eyes:     Conjunctiva/sclera: Conjunctivae normal.     Pupils: Pupils are equal, round, and reactive to light.  Neck:     Trachea: Phonation normal.  Cardiovascular:     Rate and Rhythm: Normal rate and regular rhythm.     Heart sounds: Normal heart sounds.  Pulmonary:     Effort: Pulmonary effort is normal.     Breath sounds: Normal breath sounds.  Abdominal:     Palpations: Abdomen is soft.  Tenderness: There is no abdominal tenderness.  Musculoskeletal:        General: No swelling, tenderness or deformity. Normal range of motion.     Cervical back: Normal range of motion and neck supple.  Skin:    General: Skin is warm and dry.  Neurological:     Mental Status: He is alert.     Cranial Nerves: No cranial nerve deficit.     Motor: No abnormal muscle tone.     Comments: He follows commands, but grunts more than talks.  Cannot specify if he has expressed or receptive aphasia.  He is cooperative with efforts to examine him.    ED Results / Procedures / Treatments   Labs (all labs ordered are listed, but only abnormal results are displayed) Labs Reviewed  APTT - Abnormal; Notable for the following components:      Result Value   aPTT 23 (*)    All other  components within normal limits  CBC - Abnormal; Notable for the following components:   MCV 101.6 (*)    All other components within normal limits  COMPREHENSIVE METABOLIC PANEL - Abnormal; Notable for the following components:   Chloride 95 (*)    Glucose, Bld 129 (*)    Creatinine, Ser 1.38 (*)    Calcium 10.8 (*)    Total Protein 6.4 (*)    GFR, Estimated 52 (*)    All other components within normal limits  URINALYSIS, ROUTINE W REFLEX MICROSCOPIC - Abnormal; Notable for the following components:   APPearance HAZY (*)    pH 9.0 (*)    Hgb urine dipstick LARGE (*)    Ketones, ur 5 (*)    RBC / HPF >50 (*)    All other components within normal limits  I-STAT CHEM 8, ED - Abnormal; Notable for the following components:   Chloride 95 (*)    BUN 24 (*)    Creatinine, Ser 1.40 (*)    Glucose, Bld 121 (*)    TCO2 34 (*)    All other components within normal limits  RESP PANEL BY RT-PCR (FLU A&B, COVID) ARPGX2  URINE CULTURE  CULTURE, BLOOD (ROUTINE X 2)  CULTURE, BLOOD (ROUTINE X 2)  ETHANOL  PROTIME-INR  DIFFERENTIAL  RAPID URINE DRUG SCREEN, HOSP PERFORMED  LACTIC ACID, PLASMA  SEDIMENTATION RATE  LACTIC ACID, PLASMA  CK    EKG EKG Interpretation  Date/Time:  Thursday July 29 2021 14:36:32 EST Ventricular Rate:  73 PR Interval:  160 QRS Duration: 87 QT Interval:  389 QTC Calculation: 429 R Axis:   94 Text Interpretation: Sinus rhythm Right axis deviation since last tracing no significant change Confirmed by Daleen Bo [phone]) on 07/29/2021 2:38:47 PM  Radiology CT HEAD CODE STROKE WO CONTRAST  Result Date: 07/29/2021 CLINICAL DATA:  Code stroke. Neuro deficit, acute, stroke suspected. EXAM: CT HEAD WITHOUT CONTRAST TECHNIQUE: Contiguous axial images were obtained from the base of the skull through the vertex without intravenous contrast. RADIATION DOSE REDUCTION: This exam was performed according to the departmental dose-optimization program which includes  automated exposure control, adjustment of the mA and/or kV according to patient size and/or use of iterative reconstruction technique. COMPARISON:  MRI 05/11/2021 FINDINGS: Brain: Background pattern of age related volume loss. No focal abnormality seen affecting the brainstem or cerebellum. Cerebral hemispheres show mild chronic small-vessel ischemic changes of the white matter. No sign of cortical or large vessel territory infarction, mass, hemorrhage, hydrocephalus or extra-axial collection. Vascular: There is atherosclerotic  calcification of the major vessels at the base of the brain. Skull: Negative Sinuses/Orbits: Clear/normal Other: None ASPECTS ([place] Stroke Program Early CT Score) - Ganglionic level infarction (caudate, lentiform nuclei, internal capsule, insula, M1-M3 cortex): 7 - Supraganglionic infarction (M4-M6 cortex): 3 Total score (0-10 with 10 being normal): 10 IMPRESSION: 1. No acute CT finding. Age related atrophy. Mild chronic small-vessel ischemic change of the white matter. 2. ASPECTS is 10. 3. These results were communicated to Dr. Rory Percy at 1:56 pm on 07/29/2021 by text page via the [facility] messaging system. Electronically Signed   By: Nelson Chimes M.D.   On: 07/29/2021 13:57    Procedures .Critical Care Performed by: Daleen Bo, MD Authorized by: Daleen Bo, MD   Critical care provider statement:    Critical care time (minutes):  45   Critical care start time:  07/29/2021 2:10 PM   Critical care end time:  07/29/2021 4:30 PM   Critical care time was exclusive of:  Separately billable procedures and treating other patients   Critical care was necessary to treat or prevent imminent or life-threatening deterioration of the following conditions:  CNS failure or compromise   Critical care was time spent personally by me on the following activities:  Blood draw for specimens, development of treatment plan with patient or surrogate, discussions with consultants, evaluation of  patient's response to treatment, examination of patient, ordering and performing treatments and interventions, ordering and review of laboratory studies, ordering and review of radiographic studies, pulse oximetry, re-evaluation of patient's condition and review of old charts    Medications Ordered in ED Medications  acetaminophen (TYLENOL) suppository 650 mg (has no administration in time range)    ED Course/ Medical Decision Making/ A&P Clinical Course as of 07/29/21 1631  Thu Jul 29, 2021  1439 Patient urinated and it was noted that he had blood at the tip of his penis, by nursing.  Urinalysis and urine culture have been ordered. [EW]  1510 Temperature elevated at 100.4.  We will try to give a dose of Tylenol. [EW]    Clinical Course User Index [EW] Daleen Bo, MD                           Medical Decision Making Amount and/or Complexity of Data Reviewed Labs: ordered. Radiology: ordered.  Risk OTC drugs. Decision regarding hospitalization.    Patient Vitals for the past 24 hrs:  BP Temp Temp src Pulse Resp SpO2 Weight  07/29/21 1502 -- (!) 100.4 F (38 C) Rectal -- -- -- --  07/29/21 1436 (!) 183/84 -- -- 75 18 99 % --  07/29/21 1345 (!) 177/61 -- -- -- -- -- --  07/29/21 1342 -- -- -- -- -- -- 79 kg  07/29/21 1340 (!) 170/70 -- -- -- -- -- --    Medical Decision Making: Summary of Illness/Injury: He is presenting by EMS for evaluation of altered mental status, noted around 11 AM today, with gradual onset of various symptoms 2 weeks ago.  He presented as code stroke and was immediately evaluated by neurology.  He could not complete full testing, to include CT angiogram because he could not hold still.  The patient is responsive and follows commands but CT imaging was not possible to do an angiogram.  Patient is mildly hypertensive, it is unknown if he took his morning medications.  Typically he can manage himself at home, including dressing, bathing and taking  medications.  His  wife states that he drinks a couple of scotches every night for dinner, but "he never gets drunk and is not trying to stop drinking."  Critical Interventions-going evaluation, discussion with neurologist, Dr.Arora, laboratory testing, radiography, observation and reassessment; to evaluate  Chief Complaint  Patient presents with   Code Stroke    and assess for illness characterized as Acute, Previously Undiagnosed, Uncertain Prognosis, Complicated, Systemic Symptoms, and Threat to Life/Bodily Function   The Differential Diagnoses include CVA, metabolic encephalopathy, acute infectious process, spinal myelopathy.  I obtained  Additional Historical Information from [place] wife at bedside , as the patient is a poor historian.  I decided to review pertinent External Data, and in summary.  Last week by podiatry for toenail trimming and consideration of hammertoe surgical repair.  He was evaluated by neurology, December 2022 for balance problems.  Noted normal MRI brain findings, and considered trial of lumbar drain.  PT for balance was ordered with expected follow-up later this year planned.  He had MRI brain and lumbar spine November 2022.  These did not show acute abnormalities.   Clinical Laboratory Tests Ordered, included CBC, Metabolic panel, Urinalysis, and lactate, blood cultures, viral panel, sed rate, urine drug screen, alcohol level, urine culture . Review indicates initial findings normal except urine with large blood, and greater than 50 red cells per high-power field and reported gross hematuria, chloride low, BUN high, creatinine high, glucose high, total CO2 high. Emergent testing abnormality management required for stabilization-no  Radiologic Tests Ordered, included CT head.  I independently Visualized: Radiographic images, which show no acute abnormalities  Cardiac Monitor Tracing which shows normal sinus rhythm, indicating stable cardiac  rhythm    This patient is Presenting for Evaluation of acute confusion, which does require a range of treatment options, and is a complaint that involves a high risk of morbidity and mortality.   Treatment Complication Risk Evaluation indicates appropriate disposition isHospitalization  After These Interventions, the Patient was reevaluated and was found with persistent confusion consistent with encephalopathy of unknown cause.  Mild elevation of temperature with hematuria but no other markers for urinary tract infection.  Patient has been having back pain and both fecal and urinary incontinence raising concern for spinal myelopathy.  Patient is currently unable to tolerate MRI imaging of the head, or back.  He also could not tolerate CT angio, to evaluate further for acute stroke syndromes.  Patient has been stable in the ED but requires additional evaluation because of undefined/high risk confusional state.  Discussion of Management or Test Interpretation With External Provider(s): Consultation with neuro hospitalist in the ED regarding initial management for acute confusional state.  He agrees with hospitalization and will see the patient as a Optometrist.    3:54 PM Reevaluation with update and discussion with patient and family. After initial assessment and treatment, an updated evaluation reveals no change in clinical status. Illness risk, undiagnosed etiology for encephalopathy, discussed. Daleen Bo    4:31 PM-Consult complete with intensivist. Patient case explained and discussed. Requirement for hospitalization and further diagnostic and treatment measures agreed on. Possible Risk of further decompensation. She agrees to admit patient for further evaluation and treatment. Call ended at 4:28 PM  CRITICAL CARE-yes Performed by: Daleen Bo              Final Clinical Impression(s) / ED Diagnoses Final diagnoses:  Altered mental status, unspecified altered mental status  type  Encephalopathy  Fever, unspecified fever cause    Rx / DC Orders  ED Discharge Orders     None         Daleen Bo, MD 07/30/21 Karl Bales

## 2021-07-29 NOTE — Procedures (Signed)
Lumbar Puncture Procedure Note  Randy [name]  [id number]  Apr 19, 1941  Date:07/29/21  Time:6:12 PM   Provider Performing:Hayslee Casebolt [name]   Procedure: Lumbar Puncture (51884)  Indication(s) Rule out meningitis  Consent Risks of procedure were explained to wife via phone per Dr. Carlis Abbott.  Consent process witnessed. Risks and benefits discussed with wife.  She consents to LP for evaluation of altered mental status.   Anesthesia Topical only with 1% lidocaine    Time Out Verified patient identification, verified procedure, site/side was marked, verified correct patient position, special equipment/implants available, medications/allergies/relevant history reviewed, required imaging and test results available.   Sterile Technique Maximal sterile technique including sterile barrier drape, hand hygiene, sterile gown, sterile gloves, mask, hair covering.    Procedure Description Using palpation, approximate location of L3-L4 space identified.   Lidocaine used to anesthetize skin and subcutaneous tissue overlying this area.  A 20g spinal needle was then used to access the subarachnoid space. Opening pressure: 36.5 cm H2O. Closing pressure:Not obtained. Estimated 10 ml CSF obtained.  Complications/Tolerance None; patient tolerated the procedure well.   EBL Minimal   Specimen(s) CSF   Noe Gens, MSN, APRN, NP-C, AGACNP-BC [facility] 07/29/2021, 6:15 PM   Please see [email or web address] for pager details.   From 7A-7P if no response, please call [phone] After hours, please call ELink [phone]

## 2021-07-29 NOTE — Code Documentation (Signed)
Stroke Response Nurse Documentation Code Documentation  Jaxyn Rout Sennett is a 81 y.o. male arriving to [facility] ED via [facility] on 07/29/21 with past medical hx of HTN, DM, OSA. On No antithrombotic. Code stroke was activated by EMS.    Patient from home where he was LKW at 1100 and now complaining of speech disturbances. On EMS arrival they notices confusion and word disturbances. Patient family reported recent fall.   Stroke team at the bedside on patient arrival. Labs drawn and patient cleared for CT by Dr. Eulis Foster. Patient to CT with team. NIHSS 4, see documentation for details and code stroke times. Patient with disoriented and left hemianopia on exam. The following imaging was completed: CT . Patient is not a candidate for IV Thrombolytic due to non-focal exam. Patient is not a candidate for IR due to no suspected LVO.   Care/Plan: Q2 NIHSS.  Bedside handoff with ED RN.    Meda Klinefelter  Stroke Response RN

## 2021-07-29 NOTE — Progress Notes (Addendum)
Pharmacy Antibiotic Note  Randy [name] is a 81 y.o. male admitted on 07/29/2021 with stroke-like symptoms and altered mental status with concern for acute infectious process and metabolic encephalopathy Pharmacy has been consulted for ampicillin-sulbactam and vancomycin dosing.  WBC is within normal limits at 8.3, patient is febrile with temp of 100.33F, renal function appears to be elevated with Scr 1.40. Due to concern for potential meningitis, will need to dose/monitor vancomycin using peaks and troughs instead of AUC dosing.   Plan: Ampicillin 2g q6h Acyclovir 10mg /kg q12hr (LR at 125 ml/hr) Vancomycin 1500 mg IV x 1 Start Vancomycin 1000 mg q24h Ceftriaxone 2g q12h Monitor renal function and signs of clinical improvement Follow-up cultures Levels at steady state  Weight: 79 kg (174 lb 2.6 oz)  Temp (24hrs), Avg:100.4 F (38 C), Min:100.4 F (38 C), Max:100.4 F (38 C)  Recent Labs  Lab 07/29/21 1341 07/29/21 1346 07/29/21 1515  WBC 8.3  --   --   CREATININE 1.38* 1.40*  --   LATICACIDVEN  --   --  1.4    Estimated Creatinine Clearance: 40.7 mL/min (A) (by C-G formula based on SCr of 1.4 mg/dL (H)).    Allergies  Allergen Reactions   Drug Ingredient [Atorvastatin] Other (See Comments)    Muscle pain    Antimicrobials this admission: 1/26 vancomycin >>  1/26 ampicillin >>  1/26 ceftriaxone >> 1/26 acyclovir >>  Dose adjustments this admission: none  Microbiology results: 1/26 BCx: in process 1/26 UCx: in process   Thank you for involving pharmacy in this patient's care.  Elita Quick, PharmD PGY1 Ambulatory Care Pharmacy Resident 07/29/2021 5:10 PM  **Pharmacist phone directory can be found on [email or web address] listed under [place]**  I discussed / reviewed the pharmacy note by the residente and I agree with the findings and plans as documented.  Lorelei Pont, PharmD, BCPS 07/29/2021 5:46 PM ED Clinical Pharmacist -  [phone]

## 2021-07-29 NOTE — ED Triage Notes (Signed)
Pt arrived via [facility]. Patient from home where he was LKW at 1100 and now complaining of speech disturbances and AMS. On EMS arrival they notices confusion and word disturbances. Patient family reported recent fall. Per wife, pt not on blood thinners.

## 2021-07-29 NOTE — Consult Note (Signed)
Neurology Consultation  Reason for Consult: Code stroke Referring Physician: Dr. Daleen Bo  CC: Speech changes, visual changes  History is obtained from: Chart, patient, patient's wife  HPI: Randy [name] is a 81 y.o. male has a history of diabetes, hypertension, sleep apnea presents for evaluation of strokelike symptoms at home-he has been seen by the outpatient neurology clinic for progressive weakness/fatigue most prominent in the lower extremities with difficulty with walking, some issues with memory and worsening urinary incontinence. Today, he was in his usual state of health till about last known well of 11 AM when the wife noticed that he is having trouble with his speech.  He appeared confused and did not appear in his usual state.  EMS was called.  On their evaluation, it appeared that he was having word finding difficulty, dysarthria and possible visual field deficits for which they activated a code stroke and brought him to the ER for evaluation. Noncontrast head CT was unremarkable-it was very difficult to lay still in the scanner and the noncontrasted CT had to be repeated twice before a proper image could be obtained. Of note, he had a fall 2 days ago but was able to pick himself up from the floor-did not hit head. Wife also reports increasing urinary frequency and incontinence over the past few months to years but with some acute worsening over the last few days. Hypertensive on arrival and appeared confused.  When his wife went to bedside-could not identify the wife. According to wife-not on any pain meds.  No witnessed seizure.  LKW: Presumably 11 AM today tpa given?: no, nonfocal exam Premorbid modified Rankin scale (mRS): 3  ROS: Unable to obtain due to altered mental status.   Past Medical History:  Diagnosis Date   Diabetes mellitus    Hypertension    OSA (obstructive sleep apnea) 11/24/2016        Family History  Problem Relation Age of Onset   Brain  cancer Mother    Hypertension Father    Diabetes Father    Stroke Father      Social History:   reports that he quit smoking about 20 years ago. His smoking use included cigarettes. He has a 30.00 pack-year smoking history. He quit smokeless tobacco use about 30 years ago.  His smokeless tobacco use included chew. He reports current alcohol use of about 3.0 standard drinks per week. He reports that he does not use drugs.  Medications No current facility-administered medications for this encounter.  Current Outpatient Medications:    Blood Glucose Calibration (TRUE METRIX LEVEL 1) Low SOLN, USE TO CHECK METER PRIOR TO TESTING SUGAR, Disp: 3 each, Rfl: 0   Blood Glucose Monitoring Suppl (TRUE METRIX AIR GLUCOSE METER) w/Device KIT, 1 each by Does not apply route daily., Disp: 1 kit, Rfl: 0   citalopram (CELEXA) 10 MG tablet, Take 1 tablet (10 mg total) by mouth daily., Disp: 90 tablet, Rfl: 1   Cyanocobalamin (VITAMIN B-12 PO), Take 5,000 mcg by mouth daily., Disp: , Rfl:    lisinopril-hydrochlorothiazide (ZESTORETIC) 20-12.5 MG tablet, TAKE ONE TABLET BY MOUTH DAILY, Disp: 90 tablet, Rfl: 0   metFORMIN (GLUCOPHAGE) 1000 MG tablet, TAKE ONE TABLET BY MOUTH DAILY WITH BREAKFAST, Disp: 90 tablet, Rfl: 0   TRUE METRIX BLOOD GLUCOSE TEST test strip, USE TO TEST BLOOD GLUCOSE TWICE DAILY., Disp: 200 each, Rfl: 3   TRUEPLUS LANCETS 33G MISC, USE TO CHECK BLOOD SUGAR DAILY AND PRN, Disp: 300 each, Rfl: 0  VITAMIN D PO, Take by mouth daily., Disp: , Rfl:    Exam: Current vital signs: BP (!) 177/61 (BP Location: Left Arm)    Wt 79 kg    BMI 26.48 kg/m  Vital signs in last 24 hours: BP: (177)/(61) 177/61 (01/26 1345) Weight:  [79 kg] 79 kg (01/26 1342)  General: Awake, alert confused appearing man. HEENT: Normocephalic/atraumatic CVS: Regular rate rhythm Respiratory: Clear Abdomen: Nondistended Extremities warm well perfused Neurological exam He is awake, alert, appears to be looking all  around volitionally. Follows commands. Was able to tell his age correctly No evidence of dysarthria Was able to name most of the simple objects but kept naming the thumb as a finger and would not say the word, even when told that this finger has a separate name.  Repetition intact.  Fluency intact. Attention concentration is diminished. Cranial nerve examination: Pupils are equal round reactive right, extraocular movement exam reveals questionable complete ability to look to the left, does not blink to threat consistently from either side but tends to both sides, was able to count my fingers on both sides correctly inconsistently, face appears symmetric, tongue and palate midline. Motor exam: Antigravity in all fours without drift Sensation intact to light touch grossly Coordination exam difficult to perform but no obvious gross dysmetria NIH stroke scale 1a Level of Conscious.: 0 1b LOC Questions: 1 1c LOC Commands: 0 2 Best Gaze: 0 3 Visual: 0 4 Facial Palsy: 0 5a Motor Arm - left: 0 5b Motor Arm - Right: 0 6a Motor Leg - Left: 0 6b Motor Leg - Right: 0 7 Limb Ataxia: 0 8 Sensory: 0 9 Best Language: 1 10 Dysarthria: 0 11 Extinct. and Inatten.: 0 TOTAL: 2  Labs I have reviewed labs in epic and the results pertinent to this consultation are:   CBC    Component Value Date/Time   WBC 8.3 07/29/2021 1341   RBC 4.36 07/29/2021 1341   HGB 15.6 07/29/2021 1346   HCT 46.0 07/29/2021 1346   PLT 159 07/29/2021 1341   MCV 101.6 (H) 07/29/2021 1341   MCH 33.0 07/29/2021 1341   MCHC 32.5 07/29/2021 1341   RDW 12.4 07/29/2021 1341   LYMPHSABS 1.5 07/29/2021 1341   MONOABS 0.9 07/29/2021 1341   EOSABS 0.3 07/29/2021 1341   BASOSABS 0.0 07/29/2021 1341    CMP     Component Value Date/Time   NA 135 07/29/2021 1346   K 4.1 07/29/2021 1346   CL 95 (L) 07/29/2021 1346   CO2 36 (H) 11/03/2020 1539   GLUCOSE 121 (H) 07/29/2021 1346   BUN 24 (H) 07/29/2021 1346   CREATININE 1.40  (H) 07/29/2021 1346   CALCIUM 11.5 (H) 11/03/2020 1539   PROT 6.4 04/20/2021 1329   ALBUMIN 4.4 11/03/2020 1539   AST 21 11/03/2020 1539   ALT 24 11/03/2020 1539   ALKPHOS 61 11/03/2020 1539   BILITOT 0.4 11/03/2020 1539   GFRNONAA >60 02/05/2019 0810   GFRAA >60 02/05/2019 0810    Lipid Panel     Component Value Date/Time   CHOL 181 12/07/2018 1157   TRIG 54.0 12/07/2018 1157   HDL 69.60 12/07/2018 1157   CHOLHDL 3 12/07/2018 1157   VLDL 10.8 12/07/2018 1157   LDLCALC 101 (H) 12/07/2018 1157     Imaging I have reviewed the images obtained:  CT-head-no acute changes.  Assessment: 81 year old man presenting with altered mental status, word finding difficulty and possible visual field deficits.  On my examination, low  NIH stroke scale-examination more consistent with encephalopathy. Has progressive gait difficulty, some memory loss and progressive urinary incontinence with question for normal pressure hydrocephalus in the past. We will work him up for toxic metabolic encephalopathy inpatient. He will follow-up outpatient for any concerns for NPH. Stroke less likely-tPA not offered due to low NIH stroke scale and inconsistent exam.  Exam also not pointing towards LVO and was very difficult to keep him steady on the table and hence vessel imaging was not performed emergently due to low suspicion for LVO. Also of note after the imaging completion, labs came back with deranged renal function-which might be contributing to his current presentation--had a fall a few days ago and cognitive changes today probably because of the deranged renal function.   Recommendations: Admit to hospitalist UA Chest x-ray Ammonia level EEG B12 TSH MRI brain without contrast when able to Management of toxic metabolic derangements per primary team as you are We will follow with you.  Plan discussed with Dr. Durene Fruits and patient's wife at bedside.  - Amie Portland, MD Neurologist [facility] Pager: [phone]

## 2021-07-29 NOTE — H&P (Signed)
NAME:  Randy [name], MRN:  [id number], DOB:  Jan 01, 1941, LOS: 0 ADMISSION DATE:  07/29/2021, CONSULTATION DATE:  1/26 REFERRING MD: Dr. Eulis Foster, CHIEF COMPLAINT: Altered mental status    History of Present Illness:  81 y/o M who presented to [facility] ER on 1/26 with reports of altered mental status.   The patient was seen in December by neurology for follow up regarding balance issues.  He had experienced a couple of falls, decreased energy, memory issues, anxious and feeling like he was in a fog.  He also reported urinary and bowel incontinence.  MRI of the L-spine, neuropathy was normal at that time. His MRI brain showed mild to moderate atrophy with mild to moderate ventriculomegaly.  He was recommended to have a lumbar drain trial at the time to evaluate for NPH.    He apparently told his family at dinner last night "do not let them do lots of tests on me, no extreme measures".  He was reportedly in his usual state of health until 11 am on day of presentation when his wife noticed he was having difficulties with his speech, confusion, dysarthria, word finding difficulty.  A CODE STROKE was activated and he was evaluated by Neurology.  CT of the head without contrast was unremarkable.  They had significant difficulties obtaining his imaging due to confusion and inability to lie flat.  Tmax in ER 100.4 He reportedly fell 2 days ago but was able to pick himself up.  He did not hit his head.  Initial labs notable for BUN 24 / CR 1.4, lactic acid 1.4, WBC 8.3, hgb 14.4, and platelets 159.  EKG wnl.  UA showed blood but there was also blood on his thighs & from urinary meatus during exam raising question of foley dislodgement. Granddaughter is staying with patient and his wife. Daughter is out of town currently.   PCCM called for ICU admission.   Pertinent  Medical History  DM  HTN OSA  Balance Issues  Former Smoker Drinks two scotch drinks per evening  Significant Hospital Events: Including  procedures, antibiotic start and stop dates in addition to other pertinent events   1/26 Admit with AMS   Interim History / Subjective:  As above  Tmax 100.4   Objective   Blood pressure (!) 183/84, pulse 75, temperature (!) 100.4 F (38 C), temperature source Rectal, resp. rate 18, weight 79 kg, SpO2 99 %.       No intake or output data in the 24 hours ending 07/29/21 1652 Filed Weights   07/29/21 1342  Weight: 79 kg    Examination: General: well preserved elderly male lying in bed in NAD HENT: MM pink/moist, anicteric, pupils =/reactive Lungs: non-labored at rest on room air, lungs bilaterally clear Cardiovascular: S1S2 RRR, no m/r/g Abdomen: non-distended, non-tender, bsx4 active  Extremities: warm/dry, no edema Neuro: Awake, MAE, non-focal, unable to articulate words / sentences, garbled speech  GU: blood from meatus / on thighs  Resolved Hospital Problem list     Assessment & Plan:   Acute Encephalopathy  Rule Out Meningitis  Differential dx includes meningitis, mild fever with agitation, possible NPH - note recent outpatient work up started.  UA negative, no leukocytosis, lactic acid negative.  -admit to ICU  -haldol 91m IV x1 now -may need precedex in setting of agitated delirium -empiric meningitis coverage with ampicillin, ceftriaxone, acyclovir -empiric vancomycin -decadron 10 mg IV Q6, 1st dose given before abx in ER -assess COVID, influenza  -assess blood  cultures -now LP, cultures if able to access CSF   AKI  -LR at 152m/hr  -Trend BMP / urinary output -Replace electrolytes as indicated -Avoid nephrotoxic agents, ensure adequate renal perfusion  DM  -SSI, sensitive scale   HTN  -PRN lopressor for SBP >160 sustained    Best Practice (right click and "Reselect all SmartList Selections" daily)  Diet/type: NPO DVT prophylaxis: SCD GI prophylaxis: N/A Lines: N/A Foley:  N/A Code Status:  DNR Last date of multidisciplinary goals of care  discussion: 1/26 discussed plan of care with wife.  Consent obtained for LP via phone per wife.  Wife having to care for granddaughter.  Wife indicates she would like to avoid intubation, but continue work up.  She indicates he would not want CPR.   Labs   CBC: Recent Labs  Lab 07/29/21 1341 07/29/21 1346  WBC 8.3  --   NEUTROABS 5.5  --   HGB 14.4 15.6  HCT 44.3 46.0  MCV 101.6*  --   PLT 159  --     Basic Metabolic Panel: Recent Labs  Lab 07/29/21 1341 07/29/21 1346  NA 137 135  K 4.1 4.1  CL 95* 95*  CO2 32  --   GLUCOSE 129* 121*  BUN 21 24*  CREATININE 1.38* 1.40*  CALCIUM 10.8*  --    GFR: Estimated Creatinine Clearance: 40.7 mL/min (A) (by C-G formula based on SCr of 1.4 mg/dL (H)). Recent Labs  Lab 07/29/21 1341 07/29/21 1515  WBC 8.3  --   LATICACIDVEN  --  1.4    Liver Function Tests: Recent Labs  Lab 07/29/21 1341  AST 18  ALT 15  ALKPHOS 65  BILITOT 1.0  PROT 6.4*  ALBUMIN 3.9   No results for input(s): LIPASE, AMYLASE in the last 168 hours. No results for input(s): AMMONIA in the last 168 hours.  ABG    Component Value Date/Time   TCO2 34 (H) 07/29/2021 1346     Coagulation Profile: Recent Labs  Lab 07/29/21 1341  INR 0.9    Cardiac Enzymes: No results for input(s): CKTOTAL, CKMB, CKMBINDEX, TROPONINI in the last 168 hours.  HbA1C: Hgb A1c MFr Bld  Date/Time Value Ref Range Status  11/03/2020 03:39 PM 6.5 4.6 - 6.5 % Final    Comment:    Glycemic Control Guidelines for People with Diabetes:Non Diabetic:  <6%Goal of Therapy: <7%Additional Action Suggested:  >8%   11/21/2019 10:10 AM 6.3 4.6 - 6.5 % Final    Comment:    Glycemic Control Guidelines for People with Diabetes:Non Diabetic:  <6%Goal of Therapy: <7%Additional Action Suggested:  >8%     CBG: No results for input(s): GLUCAP in the last 168 hours.  Review of Systems:   Unable to complete with patient due to AMS. Information obtained from wife via phone, staff at  bedside and prior chart review.   Past Medical History:  He,  has a past medical history of Diabetes mellitus, Hypertension, and OSA (obstructive sleep apnea) (11/24/2016).   Surgical History:   Past Surgical History:  Procedure Laterality Date   CATARACT EXTRACTION, BILATERAL Bilateral 2019   COLONOSCOPY  07/06/2011   Procedure: COLONOSCOPY;  Surgeon: JWinfield Cunas, MD;  [facility]  Service: Endoscopy;  Laterality: N/A;   ESOPHAGOGASTRODUODENOSCOPY  07/05/2011   Procedure: ESOPHAGOGASTRODUODENOSCOPY (EGD);  Surgeon: WLandry Dyke MD;  [facility];  Service: Endoscopy;  Laterality: N/A;     Social History:   reports that he quit  smoking about 20 years ago. His smoking use included cigarettes. He has a 30.00 pack-year smoking history. He quit smokeless tobacco use about 30 years ago.  His smokeless tobacco use included chew. He reports current alcohol use of about 18.0 standard drinks per week. He reports that he does not use drugs.   Family History:  His family history includes Brain cancer in his mother; Diabetes in his father; Hypertension in his father; Stroke in his father.   Allergies Allergies  Allergen Reactions   Drug Ingredient [Atorvastatin]     Myalgia     Home Medications  Prior to Admission medications   Medication Sig Start Date End Date Taking? Authorizing Provider  Blood Glucose Calibration (TRUE METRIX LEVEL 1) Low SOLN USE TO CHECK METER PRIOR TO TESTING SUGAR 11/09/17   Marletta Lor, MD  Blood Glucose Monitoring Suppl (TRUE METRIX AIR GLUCOSE METER) w/Device KIT 1 each by Does not apply route daily. 11/09/17   Marletta Lor, MD  citalopram (CELEXA) 10 MG tablet Take 1 tablet (10 mg total) by mouth daily. 11/06/20   Nafziger, Tommi Rumps, NP  Cyanocobalamin (VITAMIN B-12 PO) Take 5,000 mcg by mouth daily.    [provider]  lisinopril-hydrochlorothiazide (ZESTORETIC) 20-12.5 MG tablet TAKE ONE TABLET BY MOUTH DAILY  06/23/21   Nafziger, Tommi Rumps, NP  metFORMIN (GLUCOPHAGE) 1000 MG tablet TAKE ONE TABLET BY MOUTH DAILY WITH BREAKFAST 06/23/21   Nafziger, Tommi Rumps, NP  TRUE METRIX BLOOD GLUCOSE TEST test strip USE TO TEST BLOOD GLUCOSE TWICE DAILY. 06/06/19   Nafziger, Tommi Rumps, NP  TRUEPLUS LANCETS 33G MISC USE TO CHECK BLOOD SUGAR DAILY AND PRN 11/09/17   Marletta Lor, MD  VITAMIN D PO Take by mouth daily.    [provider]     Critical care time: 44 minutes     Noe Gens, MSN, APRN, NP-C, AGACNP-BC [facility] 07/29/2021, 4:52 PM   Please see [email or web address] for pager details.   From 7A-7P if no response, please call [phone] After hours, please call ELink [phone]

## 2021-07-30 ENCOUNTER — Inpatient Hospital Stay (HOSPITAL_COMMUNITY): Payer: Medicare PPO

## 2021-07-30 DIAGNOSIS — G9340 Encephalopathy, unspecified: Secondary | ICD-10-CM | POA: Diagnosis not present

## 2021-07-30 DIAGNOSIS — R4182 Altered mental status, unspecified: Secondary | ICD-10-CM

## 2021-07-30 LAB — GLUCOSE, CAPILLARY
Glucose-Capillary: 116 mg/dL — ABNORMAL HIGH (ref 70–99)
Glucose-Capillary: 185 mg/dL — ABNORMAL HIGH (ref 70–99)
Glucose-Capillary: 188 mg/dL — ABNORMAL HIGH (ref 70–99)
Glucose-Capillary: 170 mg/dL — ABNORMAL HIGH (ref 70–99)
Glucose-Capillary: 242 mg/dL — ABNORMAL HIGH (ref 70–99)
Glucose-Capillary: 252 mg/dL — ABNORMAL HIGH (ref 70–99)

## 2021-07-30 LAB — URINE CULTURE: Culture: 10000 — AB

## 2021-07-30 LAB — MAGNESIUM: Magnesium: 1.3 mg/dL — ABNORMAL LOW (ref 1.7–2.4)

## 2021-07-30 LAB — CBC
HCT: 43.4 % (ref 39.0–52.0)
Hemoglobin: 14.7 g/dL (ref 13.0–17.0)
MCH: 33 pg (ref 26.0–34.0)
MCHC: 33.9 g/dL (ref 30.0–36.0)
MCV: 97.3 fL (ref 80.0–100.0)
Platelets: 171 10*3/uL (ref 150–400)
RBC: 4.46 MIL/uL (ref 4.22–5.81)
RDW: 12.3 % (ref 11.5–15.5)
WBC: 8.9 10*3/uL (ref 4.0–10.5)
nRBC: 0 % (ref 0.0–0.2)

## 2021-07-30 LAB — BASIC METABOLIC PANEL
Anion gap: 13 (ref 5–15)
BUN: 17 mg/dL (ref 8–23)
CO2: 25 mmol/L (ref 22–32)
Calcium: 10.2 mg/dL (ref 8.9–10.3)
Chloride: 97 mmol/L — ABNORMAL LOW (ref 98–111)
Creatinine, Ser: 1.29 mg/dL — ABNORMAL HIGH (ref 0.61–1.24)
GFR, Estimated: 56 mL/min — ABNORMAL LOW (ref >60)
Glucose, Bld: 195 mg/dL — ABNORMAL HIGH (ref 70–99)
Potassium: 3.7 mmol/L (ref 3.5–5.1)
Sodium: 135 mmol/L (ref 135–145)

## 2021-07-30 LAB — PHOSPHORUS: Phosphorus: 2.3 mg/dL — ABNORMAL LOW (ref 2.5–4.6)

## 2021-07-30 LAB — AMMONIA: Ammonia: 17 umol/L (ref 9–35)

## 2021-07-30 LAB — TSH: TSH: 1.835 u[IU]/mL (ref 0.350–4.500)

## 2021-07-30 LAB — VITAMIN B12: Vitamin B-12: 972 pg/mL — ABNORMAL HIGH (ref 180–914)

## 2021-07-30 MED ORDER — MIDAZOLAM HCL 2 MG/2ML IJ SOLN
2.0000 mg | Freq: Once | INTRAMUSCULAR | Status: AC | PRN
  Administered 2021-07-30: 2 mg via INTRAVENOUS
  Filled 2021-07-30: qty 2

## 2021-07-30 MED ORDER — MAGNESIUM SULFATE 4 GM/100ML IV SOLN
4.0000 g | Freq: Once | INTRAVENOUS | Status: AC
  Administered 2021-07-30: 4 g via INTRAVENOUS
  Filled 2021-07-30: qty 100

## 2021-07-30 MED ORDER — POTASSIUM & SODIUM PHOSPHATES 280-160-250 MG PO PACK
2.0000 | PACK | Freq: Three times a day (TID) | ORAL | Status: AC
  Administered 2021-07-30 – 2021-07-31 (×2): 2 via ORAL
  Filled 2021-07-30 (×2): qty 2

## 2021-07-30 MED ORDER — MIDAZOLAM HCL 2 MG/2ML IJ SOLN
1.0000 mg | Freq: Once | INTRAMUSCULAR | Status: AC | PRN
  Administered 2021-07-30: 1 mg via INTRAVENOUS
  Filled 2021-07-30: qty 2

## 2021-07-30 MED ORDER — CHLORHEXIDINE GLUCONATE CLOTH 2 % EX PADS
6.0000 | MEDICATED_PAD | Freq: Every day | CUTANEOUS | Status: DC
  Administered 2021-07-30 – 2021-08-03 (×4): 6 via TOPICAL

## 2021-07-30 NOTE — Progress Notes (Signed)
[facility] Progress Note Patient Name: KAISON MCPARLAND DOB: 1940-07-12 MRN: [id number]   Date of Service  07/30/2021  HPI/Events of Note  Notified that patient is going for MRI is quite restless despite Versed 1 mg given approximately 15 minutes earlier.  On review patient drinks average of 18 drinks per week.  Qtc 0.5  eICU Interventions  Will give trial of higher dose Versed 2 mg on call to MRI Prolonged Qtc prohibitive for medications like haloperidol or Geodon     Intervention Category Intermediate Interventions: Other:  [name] 07/30/2021, 9:05 PM

## 2021-07-30 NOTE — Evaluation (Signed)
Physical Therapy Evaluation Patient Details Name: Randy [name] MRN: [id number] DOB: 09-Jul-1940 Today's Date: 07/30/2021  History of Present Illness  81 yo admitted 1/26 with acute encephalopathy with report of fall 1/23. Head CT (-). PMHx: Dm, HTN, OSA, balance issues  Clinical Impression  Pt initially very flat with eyes open and able to respond to name and questions. Pt with decreased memory, safety, balance, gait and transfers who will benefit from acute therapy to maximize mobility, safety and function. Pt normally independent driving, mowing the yard and cooking. Pt does report at least 4 falls in the last 6 months with the most recent 1/23 where he fell backward trying to pick up a package. Pt will need to be able to walk and perform transfers with supervision prior to return home or additional rehab options will be needed.    95% on RA      Recommendations for follow up therapy are one component of a multi-disciplinary discharge planning process, led by the attending physician.  Recommendations may be updated based on patient status, additional functional criteria and insurance authorization.  Follow Up Recommendations Home health PT    Assistance Recommended at Discharge Frequent or constant Supervision/Assistance  Patient can return home with the following  A little help with bathing/dressing/bathroom;Assist for transportation;Direct supervision/assist for financial management;Help with stairs or ramp for entrance;A little help with walking and/or transfers    Equipment Recommendations BSC/3in1  Recommendations for Other Services  OT consult    Functional Status Assessment Patient has had a recent decline in their functional status and demonstrates the ability to make significant improvements in function in a reasonable and predictable amount of time.     Precautions / Restrictions Precautions Precautions: Fall      Mobility  Bed Mobility Overal bed mobility: Needs  Assistance Bed Mobility: Supine to Sit     Supine to sit: Min assist     General bed mobility comments: HOB flat with min assist to elevate trunk from surface with increased time and effort    Transfers Overall transfer level: Needs assistance Equipment used: None Transfers: Sit to/from Stand Sit to Stand: Min assist           General transfer comment: min assist to rise with initial posterior LOB and assist to recover    Ambulation/Gait Ambulation/Gait assistance: Min assist Gait Distance (Feet): 35 Feet Assistive device: IV Pole Gait Pattern/deviations: Step-through pattern, Decreased stride length   Gait velocity interpretation: <1.8 ft/sec, indicate of risk for recurrent falls   General Gait Details: pt with short steps with min assist for balance and stability with pt holding IV pole, assist to change direction and increased time and cues for backing to surface  Stairs            Wheelchair Mobility    Modified Rankin (Stroke Patients Only)       Balance Overall balance assessment: Needs assistance Sitting-balance support: No upper extremity supported, Feet supported Sitting balance-Leahy Scale: Fair     Standing balance support: Single extremity supported Standing balance-Leahy Scale: Poor Standing balance comment: UE support and assist for balance in standing                             Pertinent Vitals/Pain Pain Assessment Pain Assessment: No/denies pain    Home Living Family/patient expects to be discharged to:: Private residence Living Arrangements: Spouse/significant other Available Help at Discharge: Family;Available 24 hours/day Type of Home:  House Home Access: Stairs to enter   Technical brewer of Steps: 3   Home Layout: One level Home Equipment: Conservation officer, nature (2 wheels)      Prior Function Prior Level of Function : Independent/Modified Independent             Mobility Comments: driving, mows the yard,  walks without AD ADLs Comments: performs ADLs on his own and cooks     Hand Dominance        Extremity/Trunk Assessment   Upper Extremity Assessment Upper Extremity Assessment: Overall WFL for tasks assessed    Lower Extremity Assessment Lower Extremity Assessment: Overall WFL for tasks assessed    Cervical / Trunk Assessment Cervical / Trunk Assessment: Normal  Communication   Communication: HOH  Cognition Arousal/Alertness: Awake/alert Behavior During Therapy: Flat affect Overall Cognitive Status: Impaired/Different from baseline Area of Impairment: Orientation, Memory, Following commands, Safety/judgement, Problem solving                 Orientation Level: Time, Situation   Memory: Decreased short-term memory Following Commands: Follows one step commands consistently, Follows one step commands with increased time Safety/Judgement: Decreased awareness of deficits, Decreased awareness of safety   Problem Solving: Slow processing, Requires verbal cues, Requires tactile cues General Comments: pt aware he is in the hospital, oriented to self and wife with ability to recall how they met. Pt with slow processing and initially stating month as Aug and able to recall Jan end of session        General Comments      Exercises     Assessment/Plan    PT Assessment Patient needs continued PT services  PT Problem List Decreased strength;Decreased mobility;Decreased safety awareness;Decreased activity tolerance;Decreased cognition;Decreased balance;Decreased knowledge of use of DME       PT Treatment Interventions Gait training;Balance training;Stair training;Functional mobility training;Cognitive remediation;Therapeutic activities;Patient/family education;Therapeutic exercise;DME instruction    PT Goals (Current goals can be found in the Care Plan section)  Acute Rehab PT Goals Patient Stated Goal: "drink scotch" PT Goal Formulation: With patient/family Time For  Goal Achievement: 08/13/21 Potential to Achieve Goals: Good    Frequency Min 3X/week     Co-evaluation               AM-PAC PT "6 Clicks" Mobility  Outcome Measure Help needed turning from your back to your side while in a flat bed without using bedrails?: A Little Help needed moving from lying on your back to sitting on the side of a flat bed without using bedrails?: A Little Help needed moving to and from a bed to a chair (including a wheelchair)?: A Little Help needed standing up from a chair using your arms (e.g., wheelchair or bedside chair)?: A Little Help needed to walk in hospital room?: A Lot Help needed climbing 3-5 steps with a railing? : A Lot 6 Click Score: 16    End of Session Equipment Utilized During Treatment: Gait belt Activity Tolerance: Patient tolerated treatment well Patient left: in chair;with chair alarm set;with family/visitor present Nurse Communication: Mobility status;Precautions PT Visit Diagnosis: Other abnormalities of gait and mobility (R26.89);Difficulty in walking, not elsewhere classified (R26.2);Muscle weakness (generalized) (M62.81);History of falling (Z91.81)    Time: 6010-9323 PT Time Calculation (min) (ACUTE ONLY): 34 min   Charges:   PT Evaluation $PT Eval Moderate Complexity: 1 Mod PT Treatments $Gait Training: 8-22 mins        Avrom Robarts P, PT Acute Rehabilitation Services Pager: [phone] Office: [phone]   [facility]  B Todd Argabright 07/30/2021, 12:43 PM
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
## 2021-07-30 NOTE — Progress Notes (Signed)
EEG completed, results pending. 
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
## 2021-07-30 NOTE — Procedures (Signed)
Patient Name: Randy [name]  MRN: [id number]  Epilepsy Attending: Lora Havens  Referring Physician/Provider: Dr Amie Portland Date: 07/30/2021 Duration: 21.41 mins  Patient history: 81 year old man with altered mental status word-finding difficulty and possible visual deficits noted at home with acute change. EEG to evaluate for seizure.  Level of alertness: Awake  AEDs during EEG study: None  Technical aspects: This EEG study was done with scalp electrodes positioned according to the 10-20 International system of electrode placement. Electrical activity was acquired at a sampling rate of 500Hz  and reviewed with a high frequency filter of 70Hz  and a low frequency filter of 1Hz . EEG data were recorded continuously and digitally stored.   Description: The posterior dominant rhythm consists of 8 Hz activity of moderate voltage (25-35 uV) seen predominantly in posterior head regions, symmetric and reactive to eye opening and eye closing. Hyperventilation and photic stimulation were not performed.     IMPRESSION: This study is within normal limits. No seizures or epileptiform discharges were seen throughout the recording.  Raetta Agostinelli Barbra Sarks

## 2021-07-30 NOTE — Progress Notes (Signed)
Neurology Progress Note   S:// Seen and examined, somewhat more conversant than yesterday but still remains somewhat altered. Had a LP done yesterday which is not consistent with a bacterial infection-it was done because of a fever spike. No leukocytosis No neck stiffness Mildly increased protein in the CSF-we will need continued acyclovir till HSV PCR comes back negative   O:// Current vital signs: BP (!) 153/72    Pulse 80    Temp 98.1 F (36.7 C) (Oral)    Resp 18    Wt 78 kg    SpO2 97%    BMI 26.15 kg/m  Vital signs in last 24 hours: Temp:  [98.1 F (36.7 C)-100.4 F (38 C)] 98.1 F (36.7 C) (01/27 0710) Pulse Rate:  [74-91] 80 (01/27 0700) Resp:  [12-31] 18 (01/27 0700) BP: (113-183)/(42-90) 153/72 (01/27 0700) SpO2:  [91 %-99 %] 97 % (01/27 0700) Weight:  [78 kg-79 kg] 78 kg (01/27 0354) GENERAL: Awake, alert in NAD HEENT: - Normocephalic and atraumatic, dry mm, no LN++, no Thyromegally LUNGS - Clear to auscultation bilaterally with no wheezes CV - S1S2 RRR, no m/r/g, equal pulses bilaterally. ABDOMEN - Soft, nontender, nondistended with normoactive BS Ext: warm, well perfused, intact peripheral pulses,  Neurological exam Awake alert oriented to self, the fact that it is January 2023, and he is in the hospital. Poor attention concentration No dysarthria No evidence of aphasia today-was able to name simple objects reliably, repeat, comprehend normally. Cranial nerves II through XII intact Motor examination with no drift Sensation intact light touch Coordination exam with no dysmetria   Medications  Current Facility-Administered Medications:    acetaminophen (TYLENOL) tablet 650 mg, 650 mg, Oral, Q4H PRN, Ollis, Brandi L, NP   acyclovir (ZOVIRAX) 790 mg in dextrose 5 % 150 mL IVPB, 10 mg/kg, Intravenous, Q12H, Heloise Purpura, RPH, Last Rate: 165.8 mL/hr at 07/30/21 1002, 790 mg at 07/30/21 1002   Chlorhexidine Gluconate Cloth 2 % PADS 6 each, 6 each, Topical,  Daily, Julian Hy, DO, 6 each at 07/30/21 0937   heparin injection 5,000 Units, 5,000 Units, Subcutaneous, Q8H, Ollis, Brandi L, NP, 5,000 Units at 07/30/21 0537   insulin aspart (novoLOG) injection 0-9 Units, 0-9 Units, Subcutaneous, Q4H, Ollis, Brandi L, NP, 2 Units at 07/30/21 8032   lactated ringers infusion, , Intravenous, Continuous, Rigoberto Noel, MD, Last Rate: 125 mL/hr at 07/30/21 0700, Infusion Verify at 07/30/21 0700   metoprolol tartrate (LOPRESSOR) injection 2.5-5 mg, 2.5-5 mg, Intravenous, Q3H PRN, [name], Brandi L, NP, 2.5 mg at 07/29/21 2050   midazolam (VERSED) injection 1 mg, 1 mg, Intravenous, Once PRN, Gleason, Otilio Carpen, PA-C   ondansetron (ZOFRAN) injection 4 mg, 4 mg, Intravenous, Q6H PRN, Ollis, Brandi L, NP   polyethylene glycol (MIRALAX / GLYCOLAX) packet 17 g, 17 g, Oral, Daily PRN, Donita Brooks, NP Labs CBC    Component Value Date/Time   WBC 8.9 07/30/2021 0440   RBC 4.46 07/30/2021 0440   HGB 14.7 07/30/2021 0440   HCT 43.4 07/30/2021 0440   PLT 171 07/30/2021 0440   MCV 97.3 07/30/2021 0440   MCH 33.0 07/30/2021 0440   MCHC 33.9 07/30/2021 0440   RDW 12.3 07/30/2021 0440   LYMPHSABS 1.5 07/29/2021 1341   MONOABS 0.9 07/29/2021 1341   EOSABS 0.3 07/29/2021 1341   BASOSABS 0.0 07/29/2021 1341    CMP     Component Value Date/Time   NA 135 07/30/2021 0440   K 3.7 07/30/2021 0440  CL 97 (L) 07/30/2021 0440   CO2 25 07/30/2021 0440   GLUCOSE 195 (H) 07/30/2021 0440   BUN 17 07/30/2021 0440   CREATININE 1.29 (H) 07/30/2021 0440   CALCIUM 10.2 07/30/2021 0440   PROT 6.4 (L) 07/29/2021 1341   PROT 6.4 04/20/2021 1329   ALBUMIN 3.9 07/29/2021 1341   AST 18 07/29/2021 1341   ALT 15 07/29/2021 1341   ALKPHOS 65 07/29/2021 1341   BILITOT 1.0 07/29/2021 1341   GFRNONAA 56 (L) 07/30/2021 0440   GFRAA >60 02/05/2019 0810    glycosylated hemoglobin  Lipid Panel     Component Value Date/Time   CHOL 181 12/07/2018 1157   TRIG 54.0 12/07/2018  1157   HDL 69.60 12/07/2018 1157   CHOLHDL 3 12/07/2018 1157   VLDL 10.8 12/07/2018 1157   LDLCALC 101 (H) 12/07/2018 1157   Ammonia 17 B12 972 TSH 1.8  Imaging I have reviewed images in epic and the results pertinent to this consultation are: CT head with no acute changes  Assessment: 81 year old man with altered mental status word-finding difficulty and possible visual deficits noted at home with acute change brought in for evaluation as a code stroke. Low NIH stroke scale precluded tPA. Has progressive difficulty with memory loss, urinary incontinence and gait disturbance raised suspicion for NPH as an outpatient but his bedside LP performed in the ER actually had a opening pressure of 36.5--but it was done with patient's legs and knees flexed which might not reflect the accurate pressure. In any case, and NPH work-up should be outpatient and will not be performed inpatient. His current altered mental status is likely progression of his underlying degenerative neurological process versus acute worsening in the setting of electrolyte derangements/renal impairment versus CNS infection. CSF not consistent with bacterial infection. Continue acyclovir till HSV PCR comes back negative.  Recommendations: DC meningitic antibiotics Continue acyclovir till HSV PCR comes back negative Management of toxic metabolic derangements per primary team as you are MRI of the brain would be helpful EEG-ordered  Will follow  Plan discussed with Dr. Elsworth Soho in the ICU  -- Amie Portland, MD Neurologist [facility] Pager: [phone]

## 2021-07-30 NOTE — Progress Notes (Addendum)
NAME:  Randy [name], MRN:  [id number], DOB:  02-Sep-1940, LOS: 1 ADMISSION DATE:  07/29/2021, CONSULTATION DATE:  1/26 REFERRING MD: Dr. Eulis Foster, CHIEF COMPLAINT: Altered mental status    History of Present Illness:  81 y/o M who presented to [facility] ER on 1/26 with reports of altered mental status.   The patient was seen in December by neurology for follow up regarding balance issues.  He had experienced a couple of falls, decreased energy, memory issues, anxious and feeling like he was in a fog.  He also reported urinary and bowel incontinence.  MRI of the L-spine, neuropathy was normal at that time. His MRI brain showed mild to moderate atrophy with mild to moderate ventriculomegaly.  He was recommended to have a lumbar drain trial at the time to evaluate for NPH.    He apparently told his family at dinner last night "do not let them do lots of tests on me, no extreme measures".  He was reportedly in his usual state of health until 11 am on day of presentation when his wife noticed he was having difficulties with his speech, confusion, dysarthria, word finding difficulty.  A CODE STROKE was activated and he was evaluated by Neurology.  CT of the head without contrast was unremarkable.  They had significant difficulties obtaining his imaging due to confusion and inability to lie flat.  Tmax in ER 100.4 He reportedly fell 2 days ago but was able to pick himself up.  He did not hit his head.  Initial labs notable for BUN 24 / CR 1.4, lactic acid 1.4, WBC 8.3, hgb 14.4, and platelets 159.  EKG wnl.  UA showed blood but there was also blood on his thighs & from urinary meatus during exam raising question of foley dislodgement. Granddaughter is staying with patient and his wife. Daughter is out of town currently.   PCCM called for ICU admission.   Pertinent  Medical History  DM  HTN OSA  Balance Issues  Former Smoker Drinks two scotch drinks per evening  Significant Hospital Events: Including  procedures, antibiotic start and stop dates in addition to other pertinent events   1/26 Admit with AMS  1/27 LP in the ED, no white cells, mental status much improved today  Interim History / Subjective:  Stable overnight, mental status improved, answering questions with less agitation this AM  Objective   Blood pressure (!) 153/72, pulse 80, temperature 98.1 F (36.7 C), temperature source Oral, resp. rate 18, weight 78 kg, SpO2 97 %.        Intake/Output Summary (Last 24 hours) at 07/30/2021 0758 Last data filed at 07/30/2021 0700 Gross per 24 hour  Intake 1634.39 ml  Output 560 ml  Net 1074.39 ml   Filed Weights   07/29/21 1342 07/30/21 0354  Weight: 79 kg 78 kg     General:  well-nourished elderly M, sitting up in bed in no distress HEENT: MM pink/moist, sclera anicteric and pupils equal Neuro: awake, answering questions in full sentences, oriented to person, but no to place or situation, moving all extremities without focal deficitis CV: s1s2 rrr, no m/r/g PULM:  clear bilaterally without rhonchi or wheezing  GI: soft, bsx4 active  Extremities: warm/dry, no edema  Skin: no rashes or lesions    Resolved Hospital Problem list     Assessment & Plan:   Acute Encephalopathy  Rule Out Meningitis  Differential dx includes meningitis, mild fever with agitation, possible NPH - note recent outpatient work  up started.  UA negative, no leukocytosis, lactic acid negative.   LP done 1/26 -CSF cell count with RBC's no white cells, glucose and protein high, high opening pressure -appreciate neurology following, his mental status is improving, see if we can get an MRI today -no evidence of bacterial meningitis, stop ampicillin, vanc and ceftriaxone and decadron -continue acyclovir -follow culture results -agitation improved, do not think he will need Precedex, stable for transfer to the floor -no inciting medications reported by wife, no other clear source of infection  AKI   Improving with IVF, 560cc UOP overnight -continue to trend UOP, renal indices and electrolytes -Avoid nephrotoxic agents, ensure adequate renal perfusion  DM  -SSI, sensitive scale   HTN  -PRN lopressor for SBP >160 sustained    Best Practice (right click and "Reselect all SmartList Selections" daily)  Diet/type: NPO DVT prophylaxis: SCD GI prophylaxis: N/A Lines: N/A Foley:  N/A Code Status:  DNR Last date of multidisciplinary goals of care discussion: 1/26 discussed plan of care with wife.  Consent obtained for LP via phone per wife.  Wife having to care for granddaughter.  Wife indicates she would like to avoid intubation, but continue work up.  She indicates he would not want CPR.  ~update pending 1/27  Labs   CBC: Recent Labs  Lab 07/29/21 1341 07/29/21 1346 07/30/21 0440  WBC 8.3  --  8.9  NEUTROABS 5.5  --   --   HGB 14.4 15.6 14.7  HCT 44.3 46.0 43.4  MCV 101.6*  --  97.3  PLT 159  --  171     Basic Metabolic Panel: Recent Labs  Lab 07/29/21 1341 07/29/21 1346 07/30/21 0440  NA 137 135 135  K 4.1 4.1 3.7  CL 95* 95* 97*  CO2 32  --  25  GLUCOSE 129* 121* 195*  BUN 21 24* 17  CREATININE 1.38* 1.40* 1.29*  CALCIUM 10.8*  --  10.2  MG  --   --  1.3*  PHOS  --   --  2.3*    GFR: Estimated Creatinine Clearance: 44.2 mL/min (A) (by C-G formula based on SCr of 1.29 mg/dL (H)). Recent Labs  Lab 07/29/21 1341 07/29/21 1515 07/29/21 1907 07/30/21 0440  PROCALCITON  --   --  <0.10  --   WBC 8.3  --   --  8.9  LATICACIDVEN  --  1.4 1.3  --      Liver Function Tests: Recent Labs  Lab 07/29/21 1341  AST 18  ALT 15  ALKPHOS 65  BILITOT 1.0  PROT 6.4*  ALBUMIN 3.9    No results for input(s): LIPASE, AMYLASE in the last 168 hours. No results for input(s): AMMONIA in the last 168 hours.  ABG    Component Value Date/Time   TCO2 34 (H) 07/29/2021 1346      Coagulation Profile: Recent Labs  Lab 07/29/21 1341  INR 0.9     Cardiac  Enzymes: Recent Labs  Lab 07/29/21 1907  CKTOTAL 99    HbA1C: Hgb A1c MFr Bld  Date/Time Value Ref Range Status  07/29/2021 07:07 PM 6.0 (H) 4.8 - 5.6 % Final    Comment:    (NOTE) Pre diabetes:          5.7%-6.4%  Diabetes:              >6.4%  Glycemic control for   <7.0% adults with diabetes   11/03/2020 03:39 PM 6.5 4.6 - 6.5 % Final  Comment:    Glycemic Control Guidelines for People with Diabetes:Non Diabetic:  <6%Goal of Therapy: <7%Additional Action Suggested:  >8%     CBG: Recent Labs  Lab 07/29/21 1845 07/29/21 1952 07/29/21 2322 07/30/21 0334 07/30/21 0708  GLUCAP 181* 196* 161* 185* 188*    Review of Systems:   Unable to complete with patient due to AMS. Information obtained from wife via phone, staff at bedside and prior chart review.   Past Medical History:  He,  has a past medical history of Diabetes mellitus, Hypertension, and OSA (obstructive sleep apnea) (11/24/2016).   Surgical History:   Past Surgical History:  Procedure Laterality Date   CATARACT EXTRACTION, BILATERAL Bilateral 2019   COLONOSCOPY  07/06/2011   Procedure: COLONOSCOPY;  Surgeon: Winfield Cunas., MD;  Location[facility] ENDOSCOPY;  Service: Endoscopy;  Laterality: N/A;   ESOPHAGOGASTRODUODENOSCOPY  07/05/2011   Procedure: ESOPHAGOGASTRODUODENOSCOPY (EGD);  Surgeon: Landry Dyke, MD;  Location: [facility] ENDOSCOPY;  Service: Endoscopy;  Laterality: N/A;     Social History:   reports that he quit smoking about 20 years ago. His smoking use included cigarettes. He has a 30.00 pack-year smoking history. He quit smokeless tobacco use about 30 years ago.  His smokeless tobacco use included chew. He reports current alcohol use of about 18.0 standard drinks per week. He reports that he does not use drugs.   Family History:  His family history includes Brain cancer in his mother; Diabetes in his father; Hypertension in his father; Stroke in his father.   Allergies Allergies  Allergen  Reactions   Drug Ingredient [Atorvastatin] Other (See Comments)    Muscle pain     Home Medications  Prior to Admission medications   Medication Sig Start Date End Date Taking? Authorizing Provider  Blood Glucose Calibration (TRUE METRIX LEVEL 1) Low SOLN USE TO CHECK METER PRIOR TO TESTING SUGAR 11/09/17   Marletta Lor, MD  Blood Glucose Monitoring Suppl (TRUE METRIX AIR GLUCOSE METER) w/Device KIT 1 each by Does not apply route daily. 11/09/17   Marletta Lor, MD  citalopram (CELEXA) 10 MG tablet Take 1 tablet (10 mg total) by mouth daily. 11/06/20   Nafziger, Tommi Rumps, NP  Cyanocobalamin (VITAMIN B-12 PO) Take 5,000 mcg by mouth daily.    [provider]  lisinopril-hydrochlorothiazide (ZESTORETIC) 20-12.5 MG tablet TAKE ONE TABLET BY MOUTH DAILY 06/23/21   Nafziger, Tommi Rumps, NP  metFORMIN (GLUCOPHAGE) 1000 MG tablet TAKE ONE TABLET BY MOUTH DAILY WITH BREAKFAST 06/23/21   Nafziger, Tommi Rumps, NP  TRUE METRIX BLOOD GLUCOSE TEST test strip USE TO TEST BLOOD GLUCOSE TWICE DAILY. 06/06/19   Nafziger, Tommi Rumps, NP  TRUEPLUS LANCETS 33G MISC USE TO CHECK BLOOD SUGAR DAILY AND PRN 11/09/17   Marletta Lor, MD  VITAMIN D PO Take by mouth daily.    [provider]     Critical care time: 35 minutes      CRITICAL CARE Performed by: Otilio Carpen Amora Sheehy   Total critical care time: 35 minutes  Critical care time was exclusive of separately billable procedures and treating other patients.  Critical care was necessary to treat or prevent imminent or life-threatening deterioration.  Critical care was time spent personally by me on the following activities: development of treatment plan with patient and/or surrogate as well as nursing, discussions with consultants, evaluation of patient's response to treatment, examination of patient, obtaining history from patient or surrogate, ordering and performing treatments and interventions, ordering and review of laboratory studies, ordering  and review of radiographic studies, pulse oximetry and re-evaluation of patient's condition.   Otilio Carpen Tinamarie Przybylski, PA-C [facility] See Amion for pager If no response to pager , please call [phone] until 7pm After 7:00 pm call Elink  [phone]?[place]

## 2021-07-31 DIAGNOSIS — R000 Tachycardia, unspecified: Secondary | ICD-10-CM

## 2021-07-31 DIAGNOSIS — E1129 Type 2 diabetes mellitus with other diabetic kidney complication: Secondary | ICD-10-CM

## 2021-07-31 DIAGNOSIS — R509 Fever, unspecified: Secondary | ICD-10-CM

## 2021-07-31 DIAGNOSIS — Z515 Encounter for palliative care: Secondary | ICD-10-CM

## 2021-07-31 DIAGNOSIS — R531 Weakness: Secondary | ICD-10-CM

## 2021-07-31 DIAGNOSIS — Z7189 Other specified counseling: Secondary | ICD-10-CM

## 2021-07-31 DIAGNOSIS — R269 Unspecified abnormalities of gait and mobility: Secondary | ICD-10-CM

## 2021-07-31 DIAGNOSIS — F1010 Alcohol abuse, uncomplicated: Secondary | ICD-10-CM

## 2021-07-31 LAB — COMPREHENSIVE METABOLIC PANEL
ALT: 17 U/L (ref 0–44)
AST: 29 U/L (ref 15–41)
Albumin: 3.7 g/dL (ref 3.5–5.0)
Alkaline Phosphatase: 56 U/L (ref 38–126)
Anion gap: 9 (ref 5–15)
BUN: 19 mg/dL (ref 8–23)
CO2: 30 mmol/L (ref 22–32)
Calcium: 10.3 mg/dL (ref 8.9–10.3)
Chloride: 100 mmol/L (ref 98–111)
Creatinine, Ser: 1.31 mg/dL — ABNORMAL HIGH (ref 0.61–1.24)
GFR, Estimated: 55 mL/min — ABNORMAL LOW (ref >60)
Glucose, Bld: 138 mg/dL — ABNORMAL HIGH (ref 70–99)
Potassium: 4.1 mmol/L (ref 3.5–5.1)
Sodium: 139 mmol/L (ref 135–145)
Total Bilirubin: 0.8 mg/dL (ref 0.3–1.2)
Total Protein: 6.3 g/dL — ABNORMAL LOW (ref 6.5–8.1)

## 2021-07-31 LAB — CBC
HCT: 44.7 % (ref 39.0–52.0)
Hemoglobin: 15.3 g/dL (ref 13.0–17.0)
MCH: 33.4 pg (ref 26.0–34.0)
MCHC: 34.2 g/dL (ref 30.0–36.0)
MCV: 97.6 fL (ref 80.0–100.0)
Platelets: 203 10*3/uL (ref 150–400)
RBC: 4.58 MIL/uL (ref 4.22–5.81)
RDW: 12.7 % (ref 11.5–15.5)
WBC: 17.6 10*3/uL — ABNORMAL HIGH (ref 4.0–10.5)
nRBC: 0 % (ref 0.0–0.2)

## 2021-07-31 LAB — GLUCOSE, CAPILLARY
Glucose-Capillary: 112 mg/dL — ABNORMAL HIGH (ref 70–99)
Glucose-Capillary: 128 mg/dL — ABNORMAL HIGH (ref 70–99)
Glucose-Capillary: 137 mg/dL — ABNORMAL HIGH (ref 70–99)
Glucose-Capillary: 158 mg/dL — ABNORMAL HIGH (ref 70–99)
Glucose-Capillary: 164 mg/dL — ABNORMAL HIGH (ref 70–99)
Glucose-Capillary: 172 mg/dL — ABNORMAL HIGH (ref 70–99)
Glucose-Capillary: 180 mg/dL — ABNORMAL HIGH (ref 70–99)

## 2021-07-31 LAB — HSV 1/2 PCR, CSF
HSV-1 DNA: NEGATIVE
HSV-2 DNA: NEGATIVE

## 2021-07-31 LAB — MAGNESIUM: Magnesium: 2 mg/dL (ref 1.7–2.4)

## 2021-07-31 LAB — CK: Total CK: 287 U/L (ref 49–397)

## 2021-07-31 LAB — PHOSPHORUS: Phosphorus: 3 mg/dL (ref 2.5–4.6)

## 2021-07-31 MED ORDER — LORAZEPAM 2 MG/ML IJ SOLN: 1.0000 mg | INTRAMUSCULAR | Status: AC | PRN

## 2021-07-31 MED ORDER — THIAMINE HCL 100 MG/ML IJ SOLN: 100.0000 mg | Freq: Every day | INTRAMUSCULAR | Status: DC

## 2021-07-31 MED ORDER — THIAMINE HCL 100 MG/ML IJ SOLN
500.0000 mg | Freq: Three times a day (TID) | INTRAVENOUS | Status: AC
  Administered 2021-07-31 – 2021-08-01 (×6): 500 mg via INTRAVENOUS
  Filled 2021-07-31 (×7): qty 5

## 2021-07-31 MED ORDER — THIAMINE HCL 100 MG/ML IJ SOLN
250.0000 mg | Freq: Every day | INTRAVENOUS | Status: DC
  Filled 2021-07-31: qty 2.5

## 2021-07-31 MED ORDER — FOLIC ACID 1 MG PO TABS
1.0000 mg | ORAL_TABLET | Freq: Every day | ORAL | Status: DC
  Administered 2021-07-31 – 2021-08-03 (×4): 1 mg via ORAL
  Filled 2021-07-31 (×4): qty 1

## 2021-07-31 MED ORDER — CARVEDILOL 6.25 MG PO TABS
6.2500 mg | ORAL_TABLET | Freq: Two times a day (BID) | ORAL | Status: DC
  Administered 2021-07-31 – 2021-08-02 (×5): 6.25 mg via ORAL
  Filled 2021-07-31 (×5): qty 1

## 2021-07-31 MED ORDER — CLONIDINE HCL 0.1 MG PO TABS: 0.1000 mg | ORAL_TABLET | Freq: Three times a day (TID) | ORAL | Status: DC | PRN

## 2021-07-31 MED ORDER — LORAZEPAM 1 MG PO TABS: 1.0000 mg | ORAL_TABLET | ORAL | Status: AC | PRN

## 2021-07-31 MED ORDER — METOPROLOL TARTRATE 5 MG/5ML IV SOLN
2.5000 mg | INTRAVENOUS | Status: DC | PRN
  Administered 2021-08-01: 2.5 mg via INTRAVENOUS
  Filled 2021-07-31: qty 5

## 2021-07-31 MED ORDER — ADULT MULTIVITAMIN W/MINERALS CH
1.0000 | ORAL_TABLET | Freq: Every day | ORAL | Status: DC
  Administered 2021-07-31 – 2021-08-03 (×4): 1 via ORAL
  Filled 2021-07-31 (×4): qty 1

## 2021-07-31 NOTE — Progress Notes (Signed)
PROGRESS NOTE  Randy [name] ZOX:[id number] DOB: [birth year]   PCP: Dorothyann Peng, NP  Patient is from: Home.  Lives with his wife.  DOA: 07/29/2021 LOS: 2  Chief complaints:  Chief Complaint  Patient presents with   Code Stroke     Brief Narrative / Interim history: 81 year old M with PMH of DM-2, OSA, unsteady gait, HTN, EtOH use, recurrent falls and cognitive decline brought to ED by EMS as code stroke due to altered mental status and dysarthria, and admitted for acute encephalopathy and concern for meningitis, and admitted to ICU.  He was febrile to 100.4.  CT head without acute finding.  He underwent lumbar puncture that.  Bacterial meningitis ruled out.  Antibiotics discontinued.  Concern about NPH with an opening pressure of 36.5 during LP but during this patient's legs and knees flexed.  Remains on acyclovir pending HSV PCR.  MRI brain ordered but not obtained yet due to agitation and confusion.  Also started on Warnicke dose thiamine.  Neurology following.  Subjective: Seen and examined earlier this morning.  MRI was not performed last night due to agitation and combativeness that did not improve with Versed pushes.  Has no complaints this morning but somewhat confused and seems to be hallucinating seeing boxes on the wall.  Oriented to self, place, month and person but not to date and year.  Patient's wife at bedside.  Objective: Vitals:   07/31/21 0738 07/31/21 0800 07/31/21 0900 07/31/21 1000  BP:  (!) 171/89 (!) 155/79 (!) 174/86  Pulse:  87 78 86  Resp:  (!) 21 (!) 29 (!) 23  Temp: 99.3 F (37.4 C)     TempSrc: Oral     SpO2:  97% 98% 94%  Weight:        Examination:  GENERAL: No apparent distress.  Nontoxic. HEENT: MMM.  Vision and hearing grossly intact.  NECK: Supple.  No apparent JVD.  RESP: 94% on RA.  No IWOB.  Fair aeration bilaterally. CVS:  RRR. Heart sounds normal.  ABD/GI/GU: BS+. Abd soft, NTND.  MSK/EXT:  Moves extremities. No apparent deformity.  No edema.  SKIN: no apparent skin lesion or wound NEURO: Awake, alert and oriented x4 except date of months.  Speech clear. Cranial nerves II-XII intact but not able to follow instruction for EOM. Motor 5/5 in all muscle groups of UE and LE bilaterally, Normal tone. Light sensation intact in all dermatomes of upper and lower ext bilaterally. Patellar reflex symmetric.  No pronator drift.  Finger to nose intact. PSYCH: Seems to have visual hallucination.  Procedures:  1/27-lumbar puncture  Microbiology summarized: [id number] and influenza PCR nonreactive. Urine culture with multiple species. Blood cultures NGTD. CSF culture NGTD  Assessment & Plan: Acute Encephalopathy-bacterial meningitis ruled out.  Unsteady gait, confusion, urinary incontinence and elevated opening pressure on LP raises concern for NPH.  Patient's wife reports  about 2 L of scotch every night.  Mental status seems to be waxing and waning raising concern for ICU delirium as well.  EEG, CTH, ammonia, B12, UDS, TSH and infectious work-up unrevealing so far.  No focal neurodeficit to suggest CVA.  He is fairly oriented but seems to have some visual hallucination and confusion this morning. -Neurology following -Continue acyclovir pending HSV PCR -Started on Warnicke dose timing by neurology -Reorientation and delirium precautions -Coordinate MRI during the daytime, preferably in the morning -Started CIWA with as needed Ativan -Order Ativan 2 mg IV as needed with MRI -Fall precaution   Accidental  fall at home/recurrent fall-could be from the above. -Fall precaution -PT/OT eval  AKI ruled out. Recent Labs    11/03/20 1539 07/29/21 1341 07/29/21 1346 07/30/21 0440 07/31/21 0729  BUN 24* 21 24* 17 19  CREATININE 1.25 1.38* 1.40* 1.29* 1.31*  -Monitor renal functions -Avoid nephrotoxic meds    Controlled NIDDM-2 with hyperglycemia: A1c 6.0%.  On metformin at home. Recent Labs  Lab 07/30/21 1541 07/30/21 1936  07/31/21 0040 07/31/21 0330 07/31/21 0738  GLUCAP 242* 252* 172* 180* 137*  -Continue SSI-sensitive -Continue monitoring  Uncontrolled hypertension/sinus tachycardia: BP elevated.  Could be in the setting of agitation and confusion.  Takes lisinopril/HCTZ at home -Coreg 6.25 mg twice daily -Discontinue clonidine given encephalopathy -IV metoprolol 2.5 mg every 3 hours as needed  OSA: On CPAP? -We will try CPAP once encephalopathy improves  Alcohol abuse: Per patient's wife, drinks about 2 glasses of scotch almost every night. -Start CIWA with as needed Ativan  Leukocytosis/bandemia: Likely demargination from steroid. -Continue monitoring  Physical deconditioning/recurrent fall: Patient's wife concerned about ability to provide the care he needs and asking about rehab. -PT/OT following  Goal of care counseling: Patient is DNR and DNI.  She doesn't think he is interested in aggressive interventions. Patient's wife states that he even refused to come to the hospital after fall.  -Palliative care consulted for further goal of care discussion  Body mass index is 25.91 kg/m.         DVT prophylaxis:  heparin injection 5,000 Units Start: 07/29/21 2200 SCDs Start: 07/29/21 1748  Code Status: DNR/DNI Family Communication: Updated patient's wife at bedside Level of care: Progressive Status is: Inpatient  Remains inpatient appropriate because: Encephalopathy and further evaluation   Final disposition: TBD.    Consultants:  Pulmonology admitted patient Neurology   Sch Meds:  Scheduled Meds:  carvedilol  6.25 mg Oral BID WC   Chlorhexidine Gluconate Cloth  6 each Topical Daily   folic acid  1 mg Oral Daily   heparin  5,000 Units Subcutaneous Q8H   insulin aspart  0-9 Units Subcutaneous Q4H   multivitamin with minerals  1 tablet Oral Daily   potassium & sodium phosphates  2 packet Oral TID   [START ON 08/08/2021] thiamine injection  100 mg Intravenous Daily    Continuous Infusions:  acyclovir 166 mL/hr at 07/31/21 1100   thiamine injection Stopped (07/31/21 0921)   Followed by   Derrill Memo ON 08/02/2021] thiamine injection     PRN Meds:.acetaminophen, LORazepam **OR** LORazepam, metoprolol tartrate, ondansetron (ZOFRAN) IV, polyethylene glycol  Antimicrobials: Anti-infectives (From admission, onward)    Start     Dose/Rate Route Frequency Ordered Stop   07/30/21 1800  vancomycin (VANCOCIN) IVPB 1000 mg/200 mL premix  Status:  Discontinued        1,000 mg 200 mL/hr over 60 Minutes Intravenous Every 24 hours 07/29/21 1950 07/30/21 0928   07/29/21 1830  ampicillin (OMNIPEN) 2 g in sodium chloride 0.9 % 100 mL IVPB  Status:  Discontinued        2 g 300 mL/hr over 20 Minutes Intravenous Every 6 hours 07/29/21 1743 07/30/21 0928   07/29/21 1827  acyclovir (ZOVIRAX) 790 mg in dextrose 5 % 150 mL IVPB        10 mg/kg  79 kg 165.8 mL/hr over 60 Minutes Intravenous Every 12 hours 07/29/21 1743     07/29/21 1745  ampicillin (OMNIPEN) 2 g in sodium chloride 0.9 % 100 mL IVPB  Status:  Discontinued  2 g 300 mL/hr over 20 Minutes Intravenous Every 4 hours 07/29/21 1736 07/29/21 1747   07/29/21 1715  vancomycin (VANCOREADY) IVPB 1500 mg/300 mL        1,500 mg 150 mL/hr over 120 Minutes Intravenous  Once 07/29/21 1701 07/29/21 1922   07/29/21 1715  Ampicillin-Sulbactam (UNASYN) 3 g in sodium chloride 0.9 % 100 mL IVPB  Status:  Discontinued        3 g 200 mL/hr over 30 Minutes Intravenous Every 6 hours 07/29/21 1701 07/29/21 1701   07/29/21 1715  Ampicillin-Sulbactam (UNASYN) 3 g in sodium chloride 0.9 % 100 mL IVPB  Status:  Discontinued        3 g 200 mL/hr over 30 Minutes Intravenous Every 6 hours 07/29/21 1701 07/29/21 1735   07/29/21 1656  cefTRIAXone (ROCEPHIN) 2 g in sodium chloride 0.9 % 100 mL IVPB  Status:  Discontinued        2 g 200 mL/hr over 30 Minutes Intravenous Every 12 hours 07/29/21 1655 07/30/21 0928        I have  personally reviewed the following labs and images: CBC: Recent Labs  Lab 07/29/21 1341 07/29/21 1346 07/30/21 0440 07/31/21 0729  WBC 8.3  --  8.9 17.6*  NEUTROABS 5.5  --   --   --   HGB 14.4 15.6 14.7 15.3  HCT 44.3 46.0 43.4 44.7  MCV 101.6*  --  97.3 97.6  PLT 159  --  171 203   BMP &GFR Recent Labs  Lab 07/29/21 1341 07/29/21 1346 07/30/21 0440 07/31/21 0729  NA 137 135 135 139  K 4.1 4.1 3.7 4.1  CL 95* 95* 97* 100  CO2 32  --  25 30  GLUCOSE 129* 121* 195* 138*  BUN 21 24* 17 19  CREATININE 1.38* 1.40* 1.29* 1.31*  CALCIUM 10.8*  --  10.2 10.3  MG  --   --  1.3* 2.0  PHOS  --   --  2.3* 3.0   Estimated Creatinine Clearance: 43.5 mL/min (A) (by C-G formula based on SCr of 1.31 mg/dL (H)). Liver & Pancreas: Recent Labs  Lab 07/29/21 1341 07/31/21 0729  AST 18 29  ALT 15 17  ALKPHOS 65 56  BILITOT 1.0 0.8  PROT 6.4* 6.3*  ALBUMIN 3.9 3.7   No results for input(s): LIPASE, AMYLASE in the last 168 hours. Recent Labs  Lab 07/30/21 0811  AMMONIA 17   Diabetic: Recent Labs    07/29/21 1907  HGBA1C 6.0*   Recent Labs  Lab 07/30/21 1541 07/30/21 1936 07/31/21 0040 07/31/21 0330 07/31/21 0738  GLUCAP 242* 252* 172* 180* 137*   Cardiac Enzymes: Recent Labs  Lab 07/29/21 1907 07/31/21 0729  CKTOTAL 99 287   No results for input(s): PROBNP in the last 8760 hours. Coagulation Profile: Recent Labs  Lab 07/29/21 1341  INR 0.9   Thyroid Function Tests: Recent Labs    07/30/21 0811  TSH 1.835   Lipid Profile: No results for input(s): CHOL, HDL, LDLCALC, TRIG, CHOLHDL, LDLDIRECT in the last 72 hours. Anemia Panel: Recent Labs    07/30/21 0811  VITAMINB12 972*   Urine analysis:    Component Value Date/Time   COLORURINE YELLOW 07/29/2021 1513   APPEARANCEUR HAZY (A) 07/29/2021 1513   LABSPEC 1.008 07/29/2021 1513   PHURINE 9.0 (H) 07/29/2021 1513   GLUCOSEU NEGATIVE 07/29/2021 1513   GLUCOSEU NEGATIVE 11/03/2020 1539   HGBUR  LARGE (A) 07/29/2021 1513   BILIRUBINUR NEGATIVE 07/29/2021 1513  BILIRUBINUR n 02/12/2019 1122   KETONESUR 5 (A) 07/29/2021 1513   PROTEINUR NEGATIVE 07/29/2021 1513   UROBILINOGEN 0.2 11/03/2020 1539   NITRITE NEGATIVE 07/29/2021 1513   LEUKOCYTESUR NEGATIVE 07/29/2021 1513   Sepsis Labs: Invalid input(s): PROCALCITONIN, [place]  Microbiology: Recent Results (from the past 240 hour(s))  Urine Culture     Status: Abnormal   Collection Time: 07/29/21  1:36 PM   Specimen: Urine, Clean Catch  Result Value Ref Range Status   Specimen Description URINE, CLEAN CATCH  Final   Special Requests   Final    NONE Performed at [facility], [address], [place], [place] [address]    Culture (A)  Final    10,000 COLONIES/mL MULTIPLE SPECIES PRESENT, SUGGEST RECOLLECTION   Report Status 07/30/2021 FINAL  Final  Resp Panel by RT-PCR (Flu A&B, Covid) Nasopharyngeal Swab     Status: None   Collection Time: 07/29/21  1:41 PM   Specimen: Nasopharyngeal Swab; Nasopharyngeal(NP) swabs in vial transport medium  Result Value Ref Range Status   SARS Coronavirus 2 by RT PCR NEGATIVE NEGATIVE Final    Comment: (NOTE) SARS-CoV-2 target nucleic acids are NOT DETECTED.  The SARS-CoV-2 RNA is generally detectable in upper respiratory specimens during the acute phase of infection. The lowest concentration of SARS-CoV-2 viral copies this assay can detect is 138 copies/mL. A negative result does not preclude SARS-Cov-2 infection and should not be used as the sole basis for treatment or other patient management decisions. A negative result may occur with  improper specimen collection/handling, submission of specimen other than nasopharyngeal swab, presence of viral mutation(s) within the areas targeted by this assay, and inadequate number of viral copies(<138 copies/mL). A negative result must be combined with clinical observations, patient history, and epidemiological information. The  expected result is Negative.  Fact Sheet for Patients:  EntrepreneurPulse.com.au  Fact Sheet for Healthcare Providers:  IncredibleEmployment.be  This test is no t yet approved or cleared by the [place] FDA and  has been authorized for detection and/or diagnosis of SARS-CoV-2 by FDA under an Emergency Use Authorization (EUA). This EUA will remain  in effect (meaning this test can be used) for the duration of the COVID-19 declaration under Section 564(b)(1) of the Act, 21 U.S.C.section 360bbb-3(b)(1), unless the authorization is terminated  or revoked sooner.       Influenza A by PCR NEGATIVE NEGATIVE Final   Influenza B by PCR NEGATIVE NEGATIVE Final    Comment: (NOTE) The Xpert Xpress SARS-CoV-2/FLU/RSV plus assay is intended as an aid in the diagnosis of influenza from Nasopharyngeal swab specimens and should not be used as a sole basis for treatment. Nasal washings and aspirates are unacceptable for Xpert Xpress SARS-CoV-2/FLU/RSV testing.  Fact Sheet for Patients: EntrepreneurPulse.com.au  Fact Sheet for Healthcare Providers: IncredibleEmployment.be  This test is not yet approved or cleared by the [place] FDA and has been authorized for detection and/or diagnosis of SARS-CoV-2 by FDA under an Emergency Use Authorization (EUA). This EUA will remain in effect (meaning this test can be used) for the duration of the COVID-19 declaration under Section 564(b)(1) of the Act, 21 U.S.C. section 360bbb-3(b)(1), unless the authorization is terminated or revoked.  Performed at [facility], [place] [address] [place], [place] [address]   Culture, blood (routine x 2)     Status: None (Preliminary result)   Collection Time: 07/29/21  3:15 PM   Specimen: BLOOD RIGHT FOREARM  Result Value Ref Range Status  Specimen Description BLOOD RIGHT FOREARM  Final   Special Requests   Final    BOTTLES  DRAWN AEROBIC AND ANAEROBIC Blood Culture results may not be optimal due to an inadequate volume of blood received in culture bottles   Culture   Final    NO GROWTH 2 DAYS Performed at [facility], [place] [address] [place], [place] [address]    Report Status PENDING  Incomplete  Culture, blood (routine x 2)     Status: None (Preliminary result)   Collection Time: 07/29/21  3:20 PM   Specimen: BLOOD RIGHT HAND  Result Value Ref Range Status   Specimen Description BLOOD RIGHT HAND  Final   Special Requests   Final    BOTTLES DRAWN AEROBIC AND ANAEROBIC Blood Culture results may not be optimal due to an inadequate volume of blood received in culture bottles   Culture   Final    NO GROWTH 2 DAYS Performed at [facility], [place] [address] [place], [place] [address]    Report Status PENDING  Incomplete  CSF culture w Gram Stain     Status: None (Preliminary result)   Collection Time: 07/29/21  6:12 PM   Specimen: CSF; Cerebrospinal Fluid  Result Value Ref Range Status   Specimen Description CSF  Final   Special Requests NONE  Final   Gram Stain NO WBC SEEN NO ORGANISMS SEEN CYTOSPIN SMEAR   Final   Culture   Final    NO GROWTH 2 DAYS Performed at [facility], [place] [address], [place], [place] [address]    Report Status PENDING  Incomplete  MRSA Next Gen by PCR, Nasal     Status: None   Collection Time: 07/29/21  7:03 PM  Result Value Ref Range Status   MRSA by PCR Next Gen NOT DETECTED NOT DETECTED Final    Comment: (NOTE) The GeneXpert MRSA Assay (FDA approved for NASAL specimens only), is one component of a comprehensive MRSA colonization surveillance program. It is not intended to diagnose MRSA infection nor to guide or monitor treatment for MRSA infections. Test performance is not FDA approved in patients less than 15 years old. Performed at [facility], [place] [address] [place], [place] [address]     Radiology Studies: EEG adult  Result Date:  07/30/2021 Lora Havens, MD     07/31/2021  8:12 AM Patient Name: Randy [name] MRN: [id number] Epilepsy Attending: Lora Havens Referring Physician/Provider: Dr Amie Portland Date: 07/30/2021 Duration: 21.41 mins Patient history: 81 year old man with altered mental status word-finding difficulty and possible visual deficits noted at home with acute change. EEG to evaluate for seizure. Level of alertness: Awake AEDs during EEG study: None Technical aspects: This EEG study was done with scalp electrodes positioned according to the 10-20 International system of electrode placement. Electrical activity was acquired at a sampling rate of 500Hz  and reviewed with a high frequency filter of 70Hz  and a low frequency filter of 1Hz . EEG data were recorded continuously and digitally stored. Description: The posterior dominant rhythm consists of 8 Hz activity of moderate voltage (25-35 uV) seen predominantly in posterior head regions, symmetric and reactive to eye opening and eye closing. Hyperventilation and photic stimulation were not performed.   IMPRESSION: This study is within normal limits. No seizures or epileptiform discharges were seen throughout the recording. Priyanka Barbra Sarks       Jsoeph Podesta T. [place]  If 7PM-7AM, please contact night-coverage  [email or web address] 07/31/2021, 11:39 AM

## 2021-07-31 NOTE — Progress Notes (Signed)
Neurology Progress Note   S:// Seen and examined More oriented today but overnight was extremely confused. Could not complete MRI due to confusion and agitation Further history obtained and documented in the chart-has about 18-20 drinks a week.    O:// Current vital signs: BP (!) 168/70    Pulse 81    Temp 98.3 F (36.8 C) (Oral)    Resp (!) 34    Wt 77.3 kg    SpO2 99%    BMI 25.91 kg/m  Vital signs in last 24 hours: Temp:  [98 F (36.7 C)-98.5 F (36.9 C)] 98.3 F (36.8 C) (01/27 1900) Pulse Rate:  [81-113] 81 (01/28 0534) Resp:  [17-34] 34 (01/28 0534) BP: (145-203)/(66-163) 168/70 (01/28 0534) SpO2:  [93 %-99 %] 99 % (01/28 0534) Weight:  [77.3 kg] 77.3 kg (01/28 0500) GENERAL: Awake, alert in NAD HEENT: - Normocephalic and atraumatic, dry mm, no LN++, no Thyromegally LUNGS - Clear to auscultation bilaterally with no wheezes CV - S1S2 RRR, no m/r/g, equal pulses bilaterally. ABDOMEN - Soft, nontender, nondistended with normoactive BS Ext: warm, well perfused, intact peripheral pulses,  Neurological exam Awake alert oriented to person place and time. Poor attention concentration No dysarthria No evidence of aphasia today-was able to name simple objects reliably, repeat, comprehend normally. Cranial nerves II through XII intact Motor examination with no drift Sensation intact light touch Coordination exam with no dysmetria but has generalized tremulousness.    Medications  Current Facility-Administered Medications:    acetaminophen (TYLENOL) tablet 650 mg, 650 mg, Oral, Q4H PRN, Ollis, Brandi L, NP   acyclovir (ZOVIRAX) 790 mg in dextrose 5 % 150 mL IVPB, 10 mg/kg, Intravenous, Q12H, Heloise Purpura, RPH, Stopped at 07/30/21 2342   Chlorhexidine Gluconate Cloth 2 % PADS 6 each, 6 each, Topical, Daily, Julian Hy, DO, 6 each at 07/30/21 7672   cloNIDine (CATAPRES) tablet 0.1 mg, 0.1 mg, Oral, Q8H PRN, Genevive Bi, Shona Needles, MD   heparin injection 5,000 Units,  5,000 Units, Subcutaneous, Q8H, Ollis, Brandi L, NP, 5,000 Units at 07/31/21 0535   insulin aspart (novoLOG) injection 0-9 Units, 0-9 Units, Subcutaneous, Q4H, Ollis, Brandi L, NP, 2 Units at 07/31/21 0334   lactated ringers infusion, , Intravenous, Continuous, Rigoberto Noel, MD, Last Rate: 50 mL/hr at 07/31/21 0500, Infusion Verify at 07/31/21 0500   metoprolol tartrate (LOPRESSOR) injection 2.5-5 mg, 2.5-5 mg, Intravenous, Q3H PRN, [name], Brandi L, NP, 5 mg at 07/31/21 0520   ondansetron (ZOFRAN) injection 4 mg, 4 mg, Intravenous, Q6H PRN, Ollis, Brandi L, NP   polyethylene glycol (MIRALAX / GLYCOLAX) packet 17 g, 17 g, Oral, Daily PRN, Ollis, Brandi L, NP   potassium & sodium phosphates (PHOS-NAK) 280-160-250 MG packet 2 packet, 2 packet, Oral, TID, Rigoberto Noel, MD, 2 packet at 07/30/21 1615   thiamine 500mg  in normal saline (66ml) IVPB, 500 mg, Intravenous, Q8H **FOLLOWED BY** [START ON 08/02/2021] thiamine (B-1) 250 mg in sodium chloride 0.9 % 50 mL IVPB, 250 mg, Intravenous, Daily **FOLLOWED BY** [START ON 08/08/2021] thiamine (B-1) injection 100 mg, 100 mg, Intravenous, Daily, Amie Portland, MD Labs CBC    Component Value Date/Time   WBC 8.9 07/30/2021 0440   RBC 4.46 07/30/2021 0440   HGB 14.7 07/30/2021 0440   HCT 43.4 07/30/2021 0440   PLT 171 07/30/2021 0440   MCV 97.3 07/30/2021 0440   MCH 33.0 07/30/2021 0440   MCHC 33.9 07/30/2021 0440   RDW 12.3 07/30/2021 0440   LYMPHSABS 1.5 07/29/2021  1341   MONOABS 0.9 07/29/2021 1341   EOSABS 0.3 07/29/2021 1341   BASOSABS 0.0 07/29/2021 1341    CMP     Component Value Date/Time   NA 135 07/30/2021 0440   K 3.7 07/30/2021 0440   CL 97 (L) 07/30/2021 0440   CO2 25 07/30/2021 0440   GLUCOSE 195 (H) 07/30/2021 0440   BUN 17 07/30/2021 0440   CREATININE 1.29 (H) 07/30/2021 0440   CALCIUM 10.2 07/30/2021 0440   PROT 6.4 (L) 07/29/2021 1341   PROT 6.4 04/20/2021 1329   ALBUMIN 3.9 07/29/2021 1341   AST 18 07/29/2021 1341   ALT  15 07/29/2021 1341   ALKPHOS 65 07/29/2021 1341   BILITOT 1.0 07/29/2021 1341   GFRNONAA 56 (L) 07/30/2021 0440   GFRAA >60 02/05/2019 0810    glycosylated hemoglobin  Lipid Panel     Component Value Date/Time   CHOL 181 12/07/2018 1157   TRIG 54.0 12/07/2018 1157   HDL 69.60 12/07/2018 1157   CHOLHDL 3 12/07/2018 1157   VLDL 10.8 12/07/2018 1157   LDLCALC 101 (H) 12/07/2018 1157   Ammonia 17 B12 972 TSH 1.8  Imaging I have reviewed images in epic and the results pertinent to this consultation are: CT head with no acute changes  Assessment: 81 year old man with altered mental status word-finding difficulty and possible visual deficits noted at home with acute change brought in for evaluation as a code stroke. Low NIH stroke scale precluded tPA. Has progressive difficulty with memory loss, urinary incontinence and gait disturbance raised suspicion for NPH as an outpatient but his bedside LP performed in the ER actually had a opening pressure of 36.5--but it was done with patient's legs and knees flexed which might not reflect the accurate pressure. In any case, and NPH work-up should be outpatient and will not be performed inpatient. His current altered mental status is likely progression of his underlying degenerative neurological process versus acute worsening in the setting of electrolyte derangements/renal impairment versus CNS infection. CSF not consistent with bacterial infection. Continue acyclovir till HSV PCR comes back negative. History of significant alcohol use makes withdrawal in the possibility Also possibility of underlying cognitive decline/dementia with unmasking and worsening during acute illness  Recommendations: Continue acyclovir till HSV PCR comes back negative Management of toxic metabolic derangements per primary team as you are MRI of the brain would be helpful-was not able to tolerate- reattempt when able to EEG-done - results pending. Start Wernickes  dosing Thiamine  Will follow  Discussed with Dr. Elsworth Soho in the unit   -- Amie Portland, MD Neurologist [facility] Pager: [phone]

## 2021-07-31 NOTE — Consult Note (Signed)
Consultation Note Date: 07/31/2021   Patient Name: Randy [name]  DOB: [birth year]  MRN: [id number]  Age / Sex: 81 y.o., male  PCP: Dorothyann Peng, NP Referring Physician: Mercy Riding, MD  Reason for Consultation: Establishing goals of care  HPI/Patient Profile: 81 y.o. male  with past medical history of DM2, OSA, HTN, alcohol abuse disorder, unsteady gait with recurrent falls, and cognitive impairment who presented to the emergency department on 07/29/2021 as a code stroke due to altered mental status and dysarthria. CT head without acute finding. He was febrile to 100.4. He was admitted to [facility] for acute encephalopathy. Lumbar puncture was performed and CSF was not consistent with bacterial meningitis.  Remains on acyclovir pending HSV PCR. MRI brain ordered but not done yet due to agitation and confusion.   Of note, history of progressive difficulty with memory loss, urinary incontinence, and gait disturbance raised suspicion for NPH with outpatient neurology. Bedside LP done in the ER had opening pressure of 36.5 but it was done with patient's legs and knees flexed which may not reflect the accurate pressure.   Clinical Assessment and Goals of Care: I have reviewed medical records including EPIC notes, labs and imaging. Discussed with Dr. Cyndia Skeeters and bedside RN. I went to see patient at bedside on 79M. He is alert with no acute complaints. We reviewed his current medical situation and hospital course so far. He is able to tell me about his family. When I asked about [place], he tells me he is hopeful to be able to "walk around the block" and mow his yard again. He did have visual hallucinations during our discussion.   I spoke with his wife/Georganna by phone to discuss diagnosis, prognosis, GOC, EOL wishes, disposition, and options. I introduced Palliative Medicine as specialized medical care for people living with  serious illness. It focuses on providing relief from the symptoms and stress of a serious illness.   We discussed a brief life review of the patient. Letitia Caul and [place] were high-school sweethearts and have been married for 40 years. They have 3 daughters - Jocelyn Lamer lives in [place], June lives in [place], and Amy lives in [place]. They have 5 grandchildren. 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   Prior to admission, patient lived with [place] in their home in [place]. As far as functional status, Cathie Olden reports a progressive decline over the past 1.5 years, with difficulty walking, falls, memory issues, and urinary incontinence. She reports a more significant decline over the past 2 weeks.   We reviewed patient's current illness and his hospital course so far. Discussed that his diagnosis remains unclear. Discussed that per neurology, differential diagnoses include: 1) progression of underlying neurodegenerative process, 2) acute worsening in the setting of electrolyte imbalances and renal impairment, or 3) CNS infection. Discussed that bacterial meningitis had been ruled out.   Cathie Olden shares that prior to admission, Letitia Caul has been making statements such as "he's had good life". He  also recently gave specific instructions to one of his grandchildren regarding his obituary and funeral arrangements.   The difference between full scope medical intervention and comfort care was considered. Cathie Olden shares that "she is prepared" for potential worst case scenario with [name]'s medical situation. She seems to indicate that he would not want extraordinary measures to prolong his life. However, she expresses that it will be difficult to make any major/difficult decisions without knowing his diagnosis. She also states that she would want to include her daughters for any major decisions that need to be made.   At this time Cathie Olden is clear she would  like to continue current medical interventions with watchful waiting. She is agreeable to continue to gain answers through test/procedures and will make stepwise decisions based off of that information. She feels strongly that MRI be obtained, even if this requires using medication for sedation.   Discussed the importance of continued conversation with patient, family, and the medical providers regarding overall plan of care and treatment options, ensuring decisions are within the context of the patients values and GOCs.   Primary decision maker: Patient is currently unable to make complex medical decisions in the setting of intermittent confusion/agitation and hallucinations.  Next of kin is wife [place].     SUMMARY OF RECOMMENDATIONS   Continue current medical interventions with watchful waiting Patient and wife are hopeful to gain answers regarding his diagnosis MRI pending and discussed with Dr. Cyndia Skeeters; agree with pre-medication prior to MRI PMT will continue to follow and support   Code Status/Advance Care Planning: DNR  Prognosis:  Unable to determine  Discharge Planning: To Be Determined      Primary Diagnoses: Present on Admission: **None**   I have reviewed the medical record, interviewed the patient and family, and examined the patient. The following aspects are pertinent.  Past Medical History:  Diagnosis Date   Diabetes mellitus    Hypertension    OSA (obstructive sleep apnea) 11/24/2016    Family History  Problem Relation Age of Onset   Brain cancer Mother    Hypertension Father    Diabetes Father    Stroke Father    Scheduled Meds:  carvedilol  6.25 mg Oral BID WC   Chlorhexidine Gluconate Cloth  6 each Topical Daily   folic acid  1 mg Oral Daily   heparin  5,000 Units Subcutaneous Q8H   insulin aspart  0-9 Units Subcutaneous Q4H   multivitamin with minerals  1 tablet Oral Daily   potassium & sodium phosphates  2 packet Oral TID   [START ON  08/08/2021] thiamine injection  100 mg Intravenous Daily   Continuous Infusions:  acyclovir Stopped (07/31/21 1126)   thiamine injection Stopped (07/31/21 0921)   Followed by   Derrill Memo ON 08/02/2021] thiamine injection     PRN Meds:.acetaminophen, LORazepam **OR** LORazepam, metoprolol tartrate, ondansetron (ZOFRAN) IV, polyethylene glycol   Allergies  Allergen Reactions   Drug Ingredient [Atorvastatin] Other (See Comments)    Muscle pain   Review of Systems  Constitutional:  Positive for activity change.   Physical Exam Vitals reviewed.  Constitutional:      General: He is not in acute distress. Pulmonary:     Effort: Pulmonary effort is normal.  Neurological:     Mental Status: He is alert and oriented to person, place, and time.  Psychiatric:        Attention and Perception: He perceives visual hallucinations.    Vital Signs: BP 135/70  Pulse 63    Temp 98.5 F (36.9 C) (Oral)    Resp (!) 27    Wt 77.3 kg    SpO2 96%    BMI 25.91 kg/m  Pain Scale: CPOT     SpO2: SpO2: 96 % O2 Device:SpO2: 96 % O2 Flow Rate: .    LBM: Last BM Date: 07/30/21 Baseline Weight: Weight: 79 kg Most recent weight: Weight: 77.3 kg      Palliative Assessment/Data: PPS 40%     Total time: 76 minutes  Greater than 50%  of this time was spent counseling and coordinating care related to the above assessment and plan.   Signed by: Lavena Bullion, NP   Please contact Palliative Medicine Team phone at [phone] for questions and concerns.  For individual provider: See Shea Evans

## 2021-08-01 ENCOUNTER — Inpatient Hospital Stay (HOSPITAL_COMMUNITY): Payer: Medicare PPO

## 2021-08-01 DIAGNOSIS — Z66 Do not resuscitate: Secondary | ICD-10-CM

## 2021-08-01 LAB — CBC
HCT: 42.6 % (ref 39.0–52.0)
Hemoglobin: 14 g/dL (ref 13.0–17.0)
MCH: 32.6 pg (ref 26.0–34.0)
MCHC: 32.9 g/dL (ref 30.0–36.0)
MCV: 99.3 fL (ref 80.0–100.0)
Platelets: 171 10*3/uL (ref 150–400)
RBC: 4.29 MIL/uL (ref 4.22–5.81)
RDW: 13 % (ref 11.5–15.5)
WBC: 10.2 10*3/uL (ref 4.0–10.5)
nRBC: 0 % (ref 0.0–0.2)

## 2021-08-01 LAB — HEPATIC FUNCTION PANEL
ALT: 15 U/L (ref 0–44)
AST: 20 U/L (ref 15–41)
Albumin: 3 g/dL — ABNORMAL LOW (ref 3.5–5.0)
Alkaline Phosphatase: 44 U/L (ref 38–126)
Bilirubin, Direct: 0.2 mg/dL (ref 0.0–0.2)
Indirect Bilirubin: 0.5 mg/dL (ref 0.3–0.9)
Total Bilirubin: 0.7 mg/dL (ref 0.3–1.2)
Total Protein: 5.4 g/dL — ABNORMAL LOW (ref 6.5–8.1)

## 2021-08-01 LAB — GLUCOSE, CAPILLARY
Glucose-Capillary: 129 mg/dL — ABNORMAL HIGH (ref 70–99)
Glucose-Capillary: 99 mg/dL (ref 70–99)
Glucose-Capillary: 176 mg/dL — ABNORMAL HIGH (ref 70–99)
Glucose-Capillary: 147 mg/dL — ABNORMAL HIGH (ref 70–99)
Glucose-Capillary: 186 mg/dL — ABNORMAL HIGH (ref 70–99)

## 2021-08-01 LAB — RENAL FUNCTION PANEL
Albumin: 3.1 g/dL — ABNORMAL LOW (ref 3.5–5.0)
Anion gap: 9 (ref 5–15)
BUN: 19 mg/dL (ref 8–23)
CO2: 28 mmol/L (ref 22–32)
Calcium: 9.7 mg/dL (ref 8.9–10.3)
Chloride: 100 mmol/L (ref 98–111)
Creatinine, Ser: 1.32 mg/dL — ABNORMAL HIGH (ref 0.61–1.24)
GFR, Estimated: 55 mL/min — ABNORMAL LOW (ref >60)
Glucose, Bld: 119 mg/dL — ABNORMAL HIGH (ref 70–99)
Phosphorus: 3.6 mg/dL (ref 2.5–4.6)
Potassium: 4 mmol/L (ref 3.5–5.1)
Sodium: 137 mmol/L (ref 135–145)

## 2021-08-01 LAB — MAGNESIUM: Magnesium: 1.8 mg/dL (ref 1.7–2.4)

## 2021-08-01 LAB — AMMONIA: Ammonia: 24 umol/L (ref 9–35)

## 2021-08-01 IMAGING — MR MR HEAD W/O CM
6 of 10 series · 31 of 48 positions shown · non-contrast
Comparison: [DATE]

CLINICAL DATA: Mental status change with unknown cause

EXAM:
MRI HEAD WITHOUT CONTRAST
TECHNIQUE: Multiplanar, multiecho pulse sequences of the brain and surrounding
structures were obtained without intravenous contrast.

[Series 2: DWI · axial · 3.0mm · 0.94mm/px · z∈[-55,+109]mm · 11 of 112 slices shown (1 of 2)]
[im 1/112]
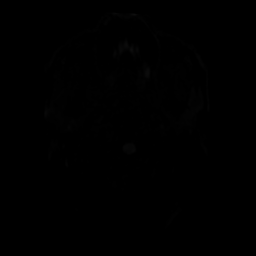
[im 12/112]
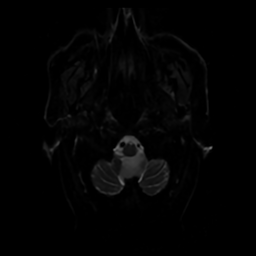
[im 23/112]
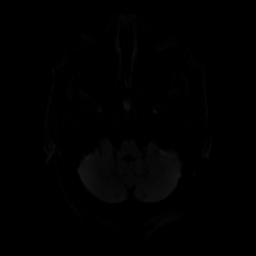
[im 34/112]
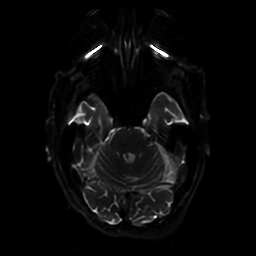
[im 45/112]
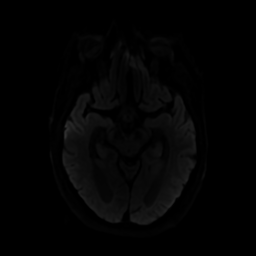
[im 56/112]
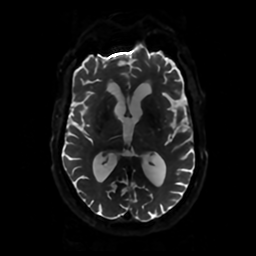
[im 67/112]
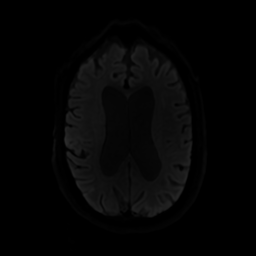
[im 78/112]
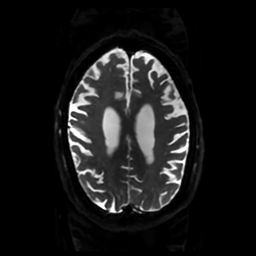
[im 89/112]
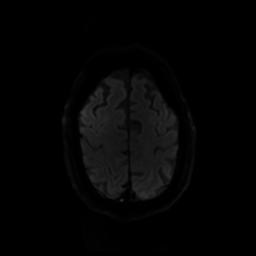
[im 100/112]
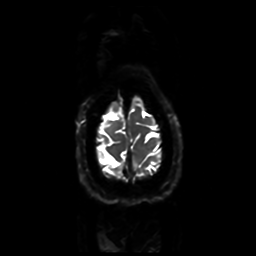
[im 112/112]
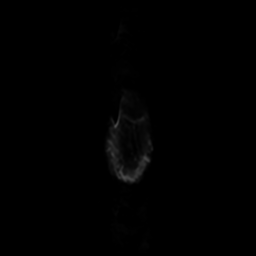

[Series 3: DWI · coronal · 4.0mm · 0.94mm/px · 7 of 78 slices shown (2 of 2)]
[im 1/78]
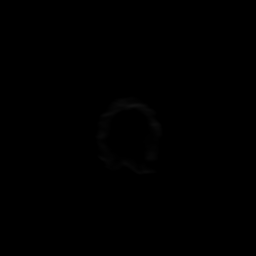
[im 13/78]
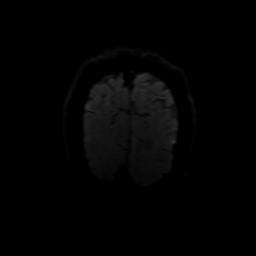
[im 26/78]
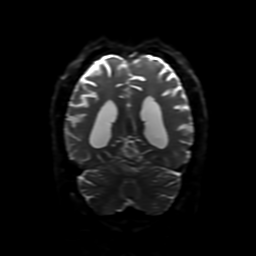
[im 39/78]
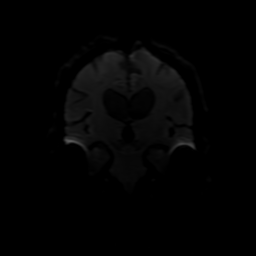
[im 52/78]
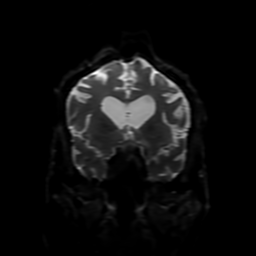
[im 65/78]
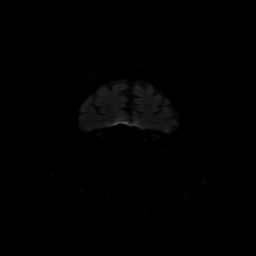
[im 78/78]
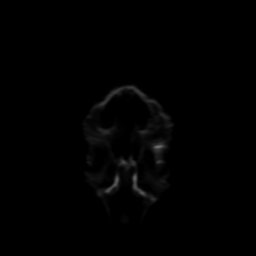

[Series 4: FLAIR · sagittal · 5.0mm · 0.23mm/px · 2 of 25 slices shown (1 of 2)]
[im 1/25]
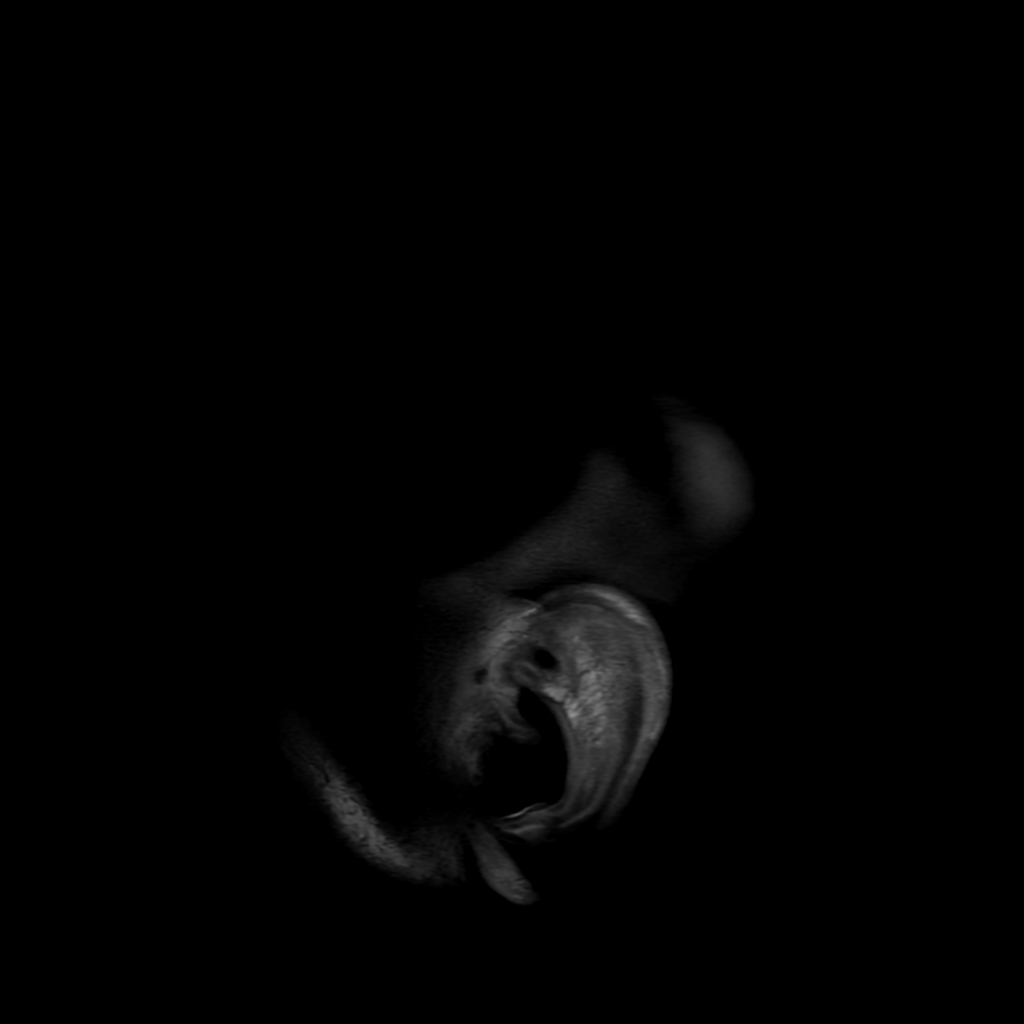
[im 25/25]
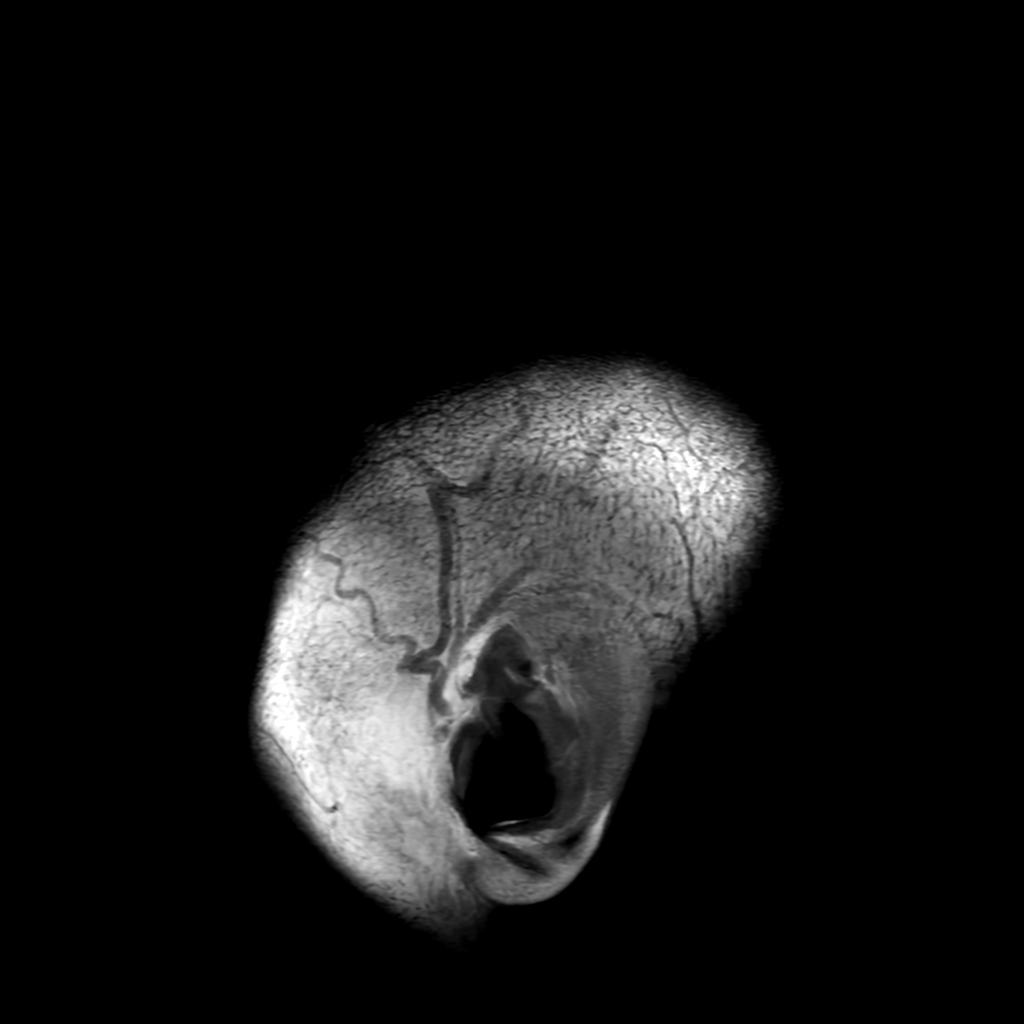

[Series 6: FLAIR · axial · 4.0mm · 0.45mm/px · z∈[-54,+108]mm · 3 of 38 slices shown (2 of 2)]
[im 1/38]
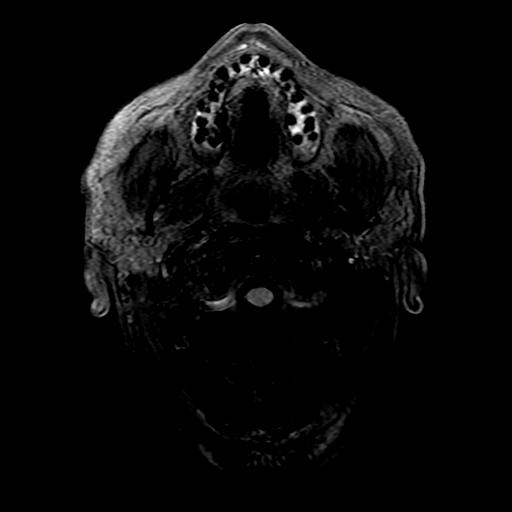
[im 19/38]
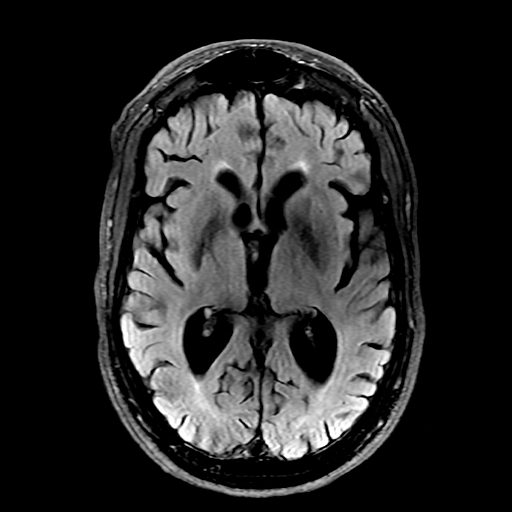
[im 38/38]
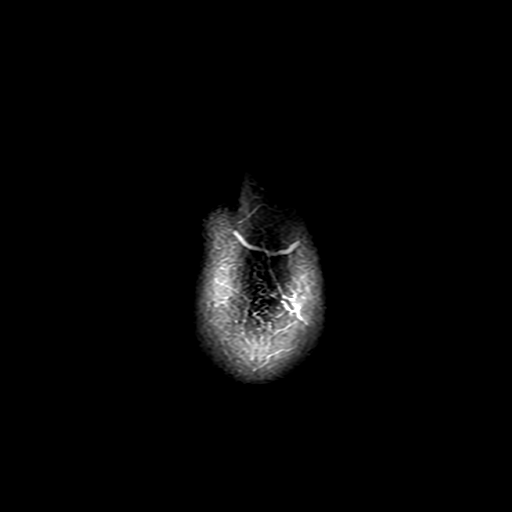

[Series 250: ADC · axial · 3.0mm · 0.94mm/px · z∈[-55,+109]mm · 5 of 56 slices shown (1 of 2)]
[im 1/56]
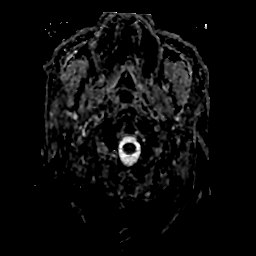
[im 14/56]
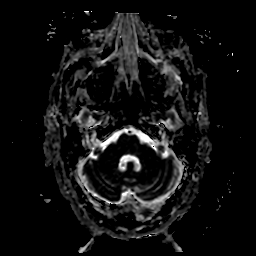
[im 28/56]
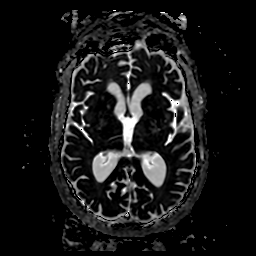
[im 42/56]
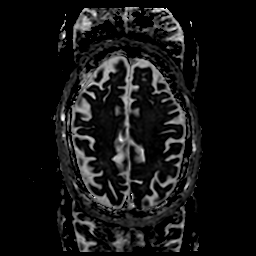
[im 56/56]
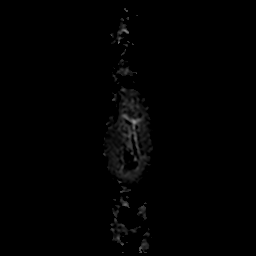

[Series 350: ADC · coronal · 4.0mm · 0.94mm/px · 3 of 39 slices shown (2 of 2)]
[im 1/39]
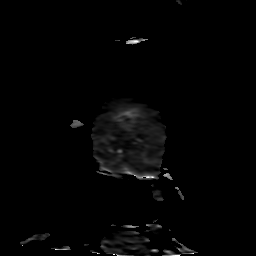
[im 20/39]
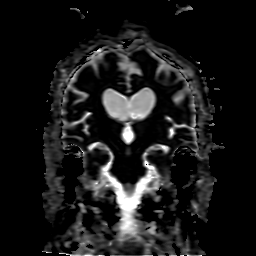
[im 39/39]
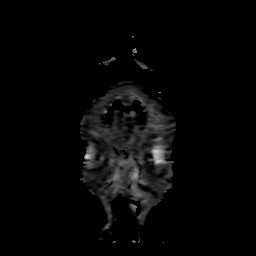

[31 of 48 positions shown; findings below may reference images not displayed]

FINDINGS: Brain: Tiny foci of restricted diffusion along the left hippocampus.
No confluent restricted diffusion or thalamic involvement highly
suggestive of seizures. No hemorrhage, hydrocephalus, or masslike
finding. Cerebral volume loss with ventriculomegaly. Limited chronic
ischemic changes. No chronic blood products.

Vascular: Normal flow voids.

Skull and upper cervical spine: Normal marrow signal.

Sinuses/Orbits: Unremarkable
IMPRESSION: 1. Tiny foci of restricted diffusion along the left hippocampus,
question transient global amnesia symptoms.
2. Generalized brain atrophy.

## 2021-08-01 MED ORDER — INSULIN ASPART 100 UNIT/ML IJ SOLN
0.0000 [IU] | Freq: Three times a day (TID) | INTRAMUSCULAR | Status: DC
  Administered 2021-08-02: 1 [IU] via SUBCUTANEOUS
  Administered 2021-08-02 (×2): 2 [IU] via SUBCUTANEOUS
  Administered 2021-08-02: 1 [IU] via SUBCUTANEOUS
  Administered 2021-08-03: 2 [IU] via SUBCUTANEOUS
  Administered 2021-08-03: 1 [IU] via SUBCUTANEOUS

## 2021-08-01 NOTE — TOC Initial Note (Signed)
Transition of Care [facility]) - Initial/Assessment Note    Patient Details  Name: Randy [name] MRN: [id number] Date of Birth: 04-Feb-1941  [facility]) CM/SW Contact:    Bartholomew Crews, RN Phone Number: [phone] 08/01/2021, 2:11 PM  Clinical Narrative:                  Spoke with patient and spouse at the bedside to discuss post acute transition. PTA lived at home with spouse and could be left alone a few hours a day while spouse works as Oceanographer 3-5 days/week.   No DME in the home, but spouse could borrow one if deemed appropriate for patient.   Discussed difference between skilled nursing facility and home health level of care. Advised that patient's progress today is home health level of care. Discussed choice of home health agencies and provided information for medicare.gov to review quality. Referral was accepted by [facility] for PT, OT, SW. Requested HH and Face to Face orders from MD.   Discussed need for private pay caregivers. Patient has long term care insurance. Provided resources for home care agencies and connected to [facility] for further discussion.   Anticipate that patient should be able to be transported in private vehicle.   TOC following for transition needs.   Expected Discharge Plan: [place] Barriers to Discharge: Continued Medical Work up   Patient Goals and CMS Choice Patient states their goals for this hospitalization and ongoing recovery are:: get stronger and back to baseline needs CMS Medicare.gov Compare Post Acute Care list provided to:: Patient Choice offered to / list presented to : Patient, Spouse  Expected Discharge Plan and Services Expected Discharge Plan: [place] In-house Referral: Hospice / Palliative Care Discharge Planning Services: CM Consult Post Acute Care Choice: [place] arrangements for the past 2 months: Single Family Home                            HH Arranged: PT, OT, Social Work [facility] Agency: [facility] Date [place]: 08/01/21 Time [place]: 1410 Representative spoke with at [place]: Freddie Breech  Prior Living Arrangements/Services Living arrangements for the past 2 months: [place] with:: Self, Spouse Patient language and need for interpreter reviewed:: Yes Do you feel safe going back to the place where you live?: Yes      Need for Family Participation in Patient Care: Yes (Comment) Care giver support system in place?: Yes (comment)   Criminal Activity/Legal Involvement Pertinent to Current Situation/Hospitalization: No - Comment as needed  Activities of Daily Living      Permission Sought/Granted Permission sought to share information with : Family Supports    Share Information with NAME: Randy [name]     Permission granted to share info w Relationship: spouse  Permission granted to share info w Contact Information: [phone]  Emotional Assessment Appearance:: Appears stated age Attitude/Demeanor/Rapport: Engaged Affect (typically observed): Accepting Orientation: : Oriented to Self, Oriented to Place, Oriented to  Time, Oriented to Situation Alcohol / Substance Use: Not Applicable Psych Involvement: No (comment)  Admission diagnosis:  Encephalopathy [G93.40] Acute encephalopathy [G93.40] Fever, unspecified fever cause [R50.9] Altered mental status, unspecified altered mental status type [R41.82] Patient Active Problem List   Diagnosis Date Noted   Altered mental status    Encephalopathy 07/29/2021   Dyspnea on exertion 03/18/2020   PVC (  premature ventricular contraction) 03/18/2020   Acquired hallux limitus of both feet 06/12/2019   Sleep related hypoxia 01/09/2017   Periodic limb movements of sleep 01/09/2017   OSA (obstructive sleep apnea) 11/24/2016   Upper GI bleed 07/06/2011   GI bleeding 07/05/2011   Anemia associated with acute blood loss  07/05/2011   DM (diabetes mellitus), type 2 ([place]) 07/05/2011   Diabetes mellitus with coincident hypertension ([place]) 07/05/2011   Hyperlipidemia 07/05/2011   PCP:  Dorothyann Peng, NP Pharmacy:   [facility] [id number] - [address] [place] [address] Phone: [phone] Fax: [phone]     Social Determinants of Health (SDOH) Interventions    Readmission Risk Interventions No flowsheet data found.

## 2021-08-01 NOTE — Progress Notes (Signed)
Neurology Progress Note   S:// Seen and examined HSV PCR Negative. Acyclovir discontinued  O:// Current vital signs: BP (!) 112/56    Pulse (!) 59    Temp 97.9 F (36.6 C) (Oral)    Resp (!) 22    Wt 80.7 kg    SpO2 100%    BMI 27.05 kg/m  Vital signs in last 24 hours: Temp:  [97.8 F (36.6 C)-99.3 F (37.4 C)] 97.9 F (36.6 C) (01/29 0331) Pulse Rate:  [59-87] 59 (01/29 0600) Resp:  [14-29] 22 (01/29 0600) BP: (103-174)/(29-89) 112/56 (01/29 0600) SpO2:  [91 %-100 %] 100 % (01/29 0600) Weight:  [80.7 kg] 80.7 kg (01/29 0500) GENERAL: Awake, alert in NAD HEENT: - Normocephalic and atraumatic, dry mm, no LN++, no Thyromegally LUNGS - Clear to auscultation bilaterally with no wheezes CV - S1S2 RRR, no m/r/g, equal pulses bilaterally. ABDOMEN - Soft, nontender, nondistended with normoactive BS Ext: warm, well perfused, intact peripheral pulses,  Neurological exam Awake alert oriented to person place and time. Diminished attention concentration No dysarthria No evidence of aphasia today-was able to name simple objects reliably, repeat, comprehend normally.  Has no recollection of the event that brought him to the hospital. Cranial nerves II through XII intact Motor examination with no drift Sensation intact light touch Coordination exam with no dysmetria but has generalized tremulousness.  Medications  Current Facility-Administered Medications:    acetaminophen (TYLENOL) tablet 650 mg, 650 mg, Oral, Q4H PRN, Ollis, Brandi L, NP   carvedilol (COREG) tablet 6.25 mg, 6.25 mg, Oral, BID WC, Gonfa, Taye T, MD, 6.25 mg at 07/31/21 1739   Chlorhexidine Gluconate Cloth 2 % PADS 6 each, 6 each, Topical, Daily, Julian Hy, DO, 6 each at 78/58/85 0277   folic acid (FOLVITE) tablet 1 mg, 1 mg, Oral, Daily, Gonfa, Taye T, MD, 1 mg at 07/31/21 1231   heparin injection 5,000 Units, 5,000 Units, Subcutaneous, Q8H, Ollis, Brandi L, NP, 5,000 Units at 08/01/21 0510   insulin aspart  (novoLOG) injection 0-9 Units, 0-9 Units, Subcutaneous, Q4H, Ollis, Brandi L, NP, 1 Units at 08/01/21 0353   LORazepam (ATIVAN) tablet 1-4 mg, 1-4 mg, Oral, Q1H PRN **OR** LORazepam (ATIVAN) injection 1-4 mg, 1-4 mg, Intravenous, Q1H PRN, Cyndia Skeeters, Taye T, MD   metoprolol tartrate (LOPRESSOR) injection 2.5 mg, 2.5 mg, Intravenous, Q3H PRN, Cyndia Skeeters, Taye T, MD   multivitamin with minerals tablet 1 tablet, 1 tablet, Oral, Daily, Cyndia Skeeters, Taye T, MD, 1 tablet at 07/31/21 1231   ondansetron (ZOFRAN) injection 4 mg, 4 mg, Intravenous, Q6H PRN, Ollis, Brandi L, NP   polyethylene glycol (MIRALAX / GLYCOLAX) packet 17 g, 17 g, Oral, Daily PRN, Ollis, Brandi L, NP   thiamine 500mg  in normal saline (73ml) IVPB, 500 mg, Intravenous, Q8H, Stopped at 08/01/21 0541 **FOLLOWED BY** [START ON 08/02/2021] thiamine (B-1) 250 mg in sodium chloride 0.9 % 50 mL IVPB, 250 mg, Intravenous, Daily **FOLLOWED BY** [START ON 08/08/2021] thiamine (B-1) injection 100 mg, 100 mg, Intravenous, Daily, Amie Portland, MD  Labs CBC    Component Value Date/Time   WBC 10.2 08/01/2021 0121   RBC 4.29 08/01/2021 0121   HGB 14.0 08/01/2021 0121   HCT 42.6 08/01/2021 0121   PLT 171 08/01/2021 0121   MCV 99.3 08/01/2021 0121   MCH 32.6 08/01/2021 0121   MCHC 32.9 08/01/2021 0121   RDW 13.0 08/01/2021 0121   LYMPHSABS 1.5 07/29/2021 1341   MONOABS 0.9 07/29/2021 1341   EOSABS 0.3 07/29/2021 1341  BASOSABS 0.0 07/29/2021 1341   CMP     Component Value Date/Time   NA 137 08/01/2021 0121   K 4.0 08/01/2021 0121   CL 100 08/01/2021 0121   CO2 28 08/01/2021 0121   GLUCOSE 119 (H) 08/01/2021 0121   BUN 19 08/01/2021 0121   CREATININE 1.32 (H) 08/01/2021 0121   CALCIUM 9.7 08/01/2021 0121   PROT 5.4 (L) 08/01/2021 0121   PROT 6.4 04/20/2021 1329   ALBUMIN 3.1 (L) 08/01/2021 0121   ALBUMIN 3.0 (L) 08/01/2021 0121   AST 20 08/01/2021 0121   ALT 15 08/01/2021 0121   ALKPHOS 44 08/01/2021 0121   BILITOT 0.7 08/01/2021 0121   GFRNONAA  55 (L) 08/01/2021 0121   GFRAA >60 02/05/2019 0810   Ammonia 17 B12 972 TSH 1.8  Imaging I have reviewed images in epic and the results pertinent to this consultation are: CT head with no acute changes  Assessment: 81 year old man with altered mental status word-finding difficulty and possible visual deficits noted at home with acute change brought in for evaluation as a code stroke. Low NIH stroke scale precluded tPA. Has progressive difficulty with memory loss, urinary incontinence and gait disturbance raised suspicion for NPH as an outpatient but his bedside LP performed in the ER actually had a opening pressure of 36.5--but it was done with patient's legs and knees flexed which might not reflect the accurate pressure. In any case, and NPH work-up should be outpatient and will not be performed inpatient. His current altered mental status is likely progression of his underlying degenerative neurological process versus acute worsening in the setting of electrolyte derangements/renal impairment versus CNS infection. CSF not consistent with bacterial infection.  HSV PCR is also negative.  Discontinue acyclovir. History of significant alcohol use makes withdrawal in the possibility Also possibility of underlying cognitive decline/dementia with unmasking and worsening during acute illness  Recommendations: DC Acyclovir Management of toxic metabolic derangements per primary team as you are MRI of the brain may be helpful-was not able to tolerate- reattempt when able to and please call us back if there are any concerning questions. EEG-done -normal. Continue full course of Wernicke's dosing thiamine.   Discussed with Dr. Cyndia Skeeters via secure chat  Inpatient neurology will be available as needed.  Please call with questions. -- Amie Portland, MD Neurologist [facility] Pager: [phone]

## 2021-08-01 NOTE — Progress Notes (Signed)
PROGRESS NOTE  Randy [name] OMB:[id number] DOB: 09/01/1940   PCP: Dorothyann Peng, NP  Patient is from: Home.  Lives with his wife.  DOA: 07/29/2021 LOS: 3  Chief complaints:  Chief Complaint  Patient presents with   Code Stroke     Brief Narrative / Interim history: 81 year old M with PMH of DM-2, OSA, unsteady gait, HTN, EtOH use, recurrent falls and cognitive decline brought to ED by EMS as code stroke due to altered mental status and dysarthria, and admitted for acute encephalopathy and concern for meningitis, and admitted to ICU.  He was febrile to 100.4.  CT head without acute finding.  He underwent lumbar puncture that.  Meningitis ruled out.  Antibiotics and acyclovir discontinued.  MRI brain: 1/29 with tiny foci of restricted diffusion along the left hippocampus and generalized brain atrophy.  EEG negative for epileptiform discharge or seizure.  Neurology started [facility] dose thiamine and recommended outpatient evaluation for possible NPH which was a concern when he was evaluated by his neurologist 3 months ago.  Subjective: Seen and examined earlier this morning.  No major events overnight of this morning.  Patient's wife at bedside.  He is awake, alert and oriented x4.  Not confused today.  No complaints other than back pain from lying in hospital bed.  Patient's wife concerned about ability to care for him at home given his fall risk.  Objective: Vitals:   08/01/21 0716 08/01/21 0800 08/01/21 0858 08/01/21 1130  BP:   140/69   Pulse:  80 88   Resp:      Temp: 97.9 F (36.6 C)   98.5 F (36.9 C)  TempSrc: Oral   Oral  SpO2:      Weight:        Examination:  GENERAL: No apparent distress.  Nontoxic. HEENT: MMM.  Vision and hearing grossly intact.  NECK: Supple.  No apparent JVD.  RESP: 96% on RA.  No IWOB.  Fair aeration bilaterally. CVS:  RRR. Heart sounds normal.  ABD/GI/GU: BS+. Abd soft, NTND.  MSK/EXT:  Moves extremities. No apparent deformity. No edema.   SKIN: no apparent skin lesion or wound NEURO: Awake and alert. Oriented appropriately.  No apparent focal neuro deficit. PSYCH: Calm. Normal affect.   Procedures:  1/27-lumbar puncture  Microbiology summarized: [id number] and influenza PCR nonreactive. Urine culture with multiple species. Blood cultures NGTD. CSF culture NGTD  Assessment & Plan: Acute Encephalopathy-bacterial meningitis ruled out.  Unsteady gait, confusion and urinary incontinence raises concern for NPH but he had elevated opening pressure to 35 cmH2O although LP was performed with patient's legs and knees flexed.  Alcohol withdrawal is another possibility.  Reportedly drinks about 2 scotches every night.  He could have ICU delirium as well.  MRI brain as above.  EEG, CTH, ammonia, B12, UDS, TSH and infectious work-up unrevealing so far.  No focal neurodeficit to suggest CVA. -Neurology started [place] dose thiamine and recommended outpatient follow-up for NPH evaluation -Reorientation and delirium precautions -Started on CIWA with as needed Ativan but did not require Ativan yet. -Fall precaution   Accidental fall at home/recurrent fall/physical deconditioning-could be from the above.  Patient's wife concerned about ability to provide the care he needs at home given his recurrent fall. -Fall precaution -Will ask therapy to reevaluate  AKI ruled out. Recent Labs    11/03/20 1539 07/29/21 1341 07/29/21 1346 07/30/21 0440 07/31/21 0729 08/01/21 0121  BUN 24* 21 24* 17 19 19   CREATININE 1.25 1.38* 1.40* 1.29* 1.31*  1.32*  -Monitor renal functions -Avoid nephrotoxic meds    Controlled NIDDM-2 with hyperglycemia: A1c 6.0%.  On metformin at home. Recent Labs  Lab 07/31/21 1923 07/31/21 2316 08/01/21 0325 08/01/21 0716 08/01/21 1130  GLUCAP 158* 112* 129* 99 176*  -Continue SSI-sensitive -Continue monitoring  Uncontrolled hypertension/sinus tachycardia: BP elevated.  Could be in the setting of agitation  and confusion.  Takes lisinopril/HCTZ at home -Coreg 6.25 mg twice daily -Discontinued clonidine-could potentially worsen encephalopathy. -IV metoprolol 2.5 mg every 3 hours as needed  OSA: On CPAP? -Try CPAP once encephalopathy improves  Alcohol abuse: Per patient's wife, drinks about 2 glasses of scotch almost every night. -Continue CIWA with as needed Ativan -Continue vitamins.  Leukocytosis/bandemia: Likely demargination from steroid.  Resolved.  Goal of care counseling: Patient is DNR and DNI.   -Palliative medicine following for further goal of care discussion.  Body mass index is 27.05 kg/m.         DVT prophylaxis:  heparin injection 5,000 Units Start: 07/29/21 2200 SCDs Start: 07/29/21 1748  Code Status: DNR/DNI Family Communication: Updated patient's wife at bedside Level of care: Progressive.  Transfer to [facility] Status is: Inpatient  Remains inpatient appropriate because: Encephalopathy requiring high-dose IV thiamine   Final disposition: TBD.    Consultants:  Pulmonology admitted patient Neurology-signed off   Sch Meds:  Scheduled Meds:  carvedilol  6.25 mg Oral BID WC   Chlorhexidine Gluconate Cloth  6 each Topical Daily   folic acid  1 mg Oral Daily   heparin  5,000 Units Subcutaneous Q8H   insulin aspart  0-9 Units Subcutaneous Q4H   multivitamin with minerals  1 tablet Oral Daily   [START ON 08/08/2021] thiamine injection  100 mg Intravenous Daily   Continuous Infusions:  thiamine injection Stopped (08/01/21 0541)   Followed by   Derrill Memo ON 08/02/2021] thiamine injection     PRN Meds:.acetaminophen, LORazepam **OR** LORazepam, metoprolol tartrate, ondansetron (ZOFRAN) IV, polyethylene glycol  Antimicrobials: Anti-infectives (From admission, onward)    Start     Dose/Rate Route Frequency Ordered Stop   07/30/21 1800  vancomycin (VANCOCIN) IVPB 1000 mg/200 mL premix  Status:  Discontinued        1,000 mg 200 mL/hr over 60 Minutes  Intravenous Every 24 hours 07/29/21 1950 07/30/21 0928   07/29/21 1830  ampicillin (OMNIPEN) 2 g in sodium chloride 0.9 % 100 mL IVPB  Status:  Discontinued        2 g 300 mL/hr over 20 Minutes Intravenous Every 6 hours 07/29/21 1743 07/30/21 0928   07/29/21 1827  acyclovir (ZOVIRAX) 790 mg in dextrose 5 % 150 mL IVPB  Status:  Discontinued        10 mg/kg  79 kg 165.8 mL/hr over 60 Minutes Intravenous Every 12 hours 07/29/21 1743 08/01/21 0723   07/29/21 1745  ampicillin (OMNIPEN) 2 g in sodium chloride 0.9 % 100 mL IVPB  Status:  Discontinued        2 g 300 mL/hr over 20 Minutes Intravenous Every 4 hours 07/29/21 1736 07/29/21 1747   07/29/21 1715  vancomycin (VANCOREADY) IVPB 1500 mg/300 mL        1,500 mg 150 mL/hr over 120 Minutes Intravenous  Once 07/29/21 1701 07/29/21 1922   07/29/21 1715  Ampicillin-Sulbactam (UNASYN) 3 g in sodium chloride 0.9 % 100 mL IVPB  Status:  Discontinued        3 g 200 mL/hr over 30 Minutes Intravenous Every 6 hours 07/29/21 1701 07/29/21 1701  07/29/21 1715  Ampicillin-Sulbactam (UNASYN) 3 g in sodium chloride 0.9 % 100 mL IVPB  Status:  Discontinued        3 g 200 mL/hr over 30 Minutes Intravenous Every 6 hours 07/29/21 1701 07/29/21 1735   07/29/21 1656  cefTRIAXone (ROCEPHIN) 2 g in sodium chloride 0.9 % 100 mL IVPB  Status:  Discontinued        2 g 200 mL/hr over 30 Minutes Intravenous Every 12 hours 07/29/21 1655 07/30/21 0928        I have personally reviewed the following labs and images: CBC: Recent Labs  Lab 07/29/21 1341 07/29/21 1346 07/30/21 0440 07/31/21 0729 08/01/21 0121  WBC 8.3  --  8.9 17.6* 10.2  NEUTROABS 5.5  --   --   --   --   HGB 14.4 15.6 14.7 15.3 14.0  HCT 44.3 46.0 43.4 44.7 42.6  MCV 101.6*  --  97.3 97.6 99.3  PLT 159  --  171 203 171   BMP &GFR Recent Labs  Lab 07/29/21 1341 07/29/21 1346 07/30/21 0440 07/31/21 0729 08/01/21 0121  NA 137 135 135 139 137  K 4.1 4.1 3.7 4.1 4.0  CL 95* 95* 97* 100  100  CO2 32  --  25 30 28   GLUCOSE 129* 121* 195* 138* 119*  BUN 21 24* 17 19 19   CREATININE 1.38* 1.40* 1.29* 1.31* 1.32*  CALCIUM 10.8*  --  10.2 10.3 9.7  MG  --   --  1.3* 2.0 1.8  PHOS  --   --  2.3* 3.0 3.6   Estimated Creatinine Clearance: 43.2 mL/min (A) (by C-G formula based on SCr of 1.32 mg/dL (H)). Liver & Pancreas: Recent Labs  Lab 07/29/21 1341 07/31/21 0729 08/01/21 0121  AST 18 29 20   ALT 15 17 15   ALKPHOS 65 56 44  BILITOT 1.0 0.8 0.7  PROT 6.4* 6.3* 5.4*  ALBUMIN 3.9 3.7 3.0*   3.1*   No results for input(s): LIPASE, AMYLASE in the last 168 hours. Recent Labs  Lab 07/30/21 0811 08/01/21 0121  AMMONIA 17 24   Diabetic: Recent Labs    07/29/21 1907  HGBA1C 6.0*   Recent Labs  Lab 07/31/21 1923 07/31/21 2316 08/01/21 0325 08/01/21 0716 08/01/21 1130  GLUCAP 158* 112* 129* 99 176*   Cardiac Enzymes: Recent Labs  Lab 07/29/21 1907 07/31/21 0729  CKTOTAL 99 287   No results for input(s): PROBNP in the last 8760 hours. Coagulation Profile: Recent Labs  Lab 07/29/21 1341  INR 0.9   Thyroid Function Tests: Recent Labs    07/30/21 0811  TSH 1.835   Lipid Profile: No results for input(s): CHOL, HDL, LDLCALC, TRIG, CHOLHDL, LDLDIRECT in the last 72 hours. Anemia Panel: Recent Labs    07/30/21 0811  VITAMINB12 972*   Urine analysis:    Component Value Date/Time   COLORURINE YELLOW 07/29/2021 1513   APPEARANCEUR HAZY (A) 07/29/2021 1513   LABSPEC 1.008 07/29/2021 1513   PHURINE 9.0 (H) 07/29/2021 1513   GLUCOSEU NEGATIVE 07/29/2021 1513   GLUCOSEU NEGATIVE 11/03/2020 1539   HGBUR LARGE (A) 07/29/2021 1513   BILIRUBINUR NEGATIVE 07/29/2021 1513   BILIRUBINUR n 02/12/2019 1122   KETONESUR 5 (A) 07/29/2021 1513   PROTEINUR NEGATIVE 07/29/2021 1513   UROBILINOGEN 0.2 11/03/2020 1539   NITRITE NEGATIVE 07/29/2021 1513   LEUKOCYTESUR NEGATIVE 07/29/2021 1513   Sepsis Labs: Invalid input(s): PROCALCITONIN,  [place]  Microbiology: Recent Results (from the past 240 hour(s))  Urine  Culture     Status: Abnormal   Collection Time: 07/29/21  1:36 PM   Specimen: Urine, Clean Catch  Result Value Ref Range Status   Specimen Description URINE, CLEAN CATCH  Final   Special Requests   Final    NONE Performed at [facility], [address] [place], [place] [address]    Culture (A)  Final    10,000 COLONIES/mL MULTIPLE SPECIES PRESENT, SUGGEST RECOLLECTION   Report Status 07/30/2021 FINAL  Final  Resp Panel by RT-PCR (Flu A&B, Covid) Nasopharyngeal Swab     Status: None   Collection Time: 07/29/21  1:41 PM   Specimen: Nasopharyngeal Swab; Nasopharyngeal(NP) swabs in vial transport medium  Result Value Ref Range Status   SARS Coronavirus 2 by RT PCR NEGATIVE NEGATIVE Final    Comment: (NOTE) SARS-CoV-2 target nucleic acids are NOT DETECTED.  The SARS-CoV-2 RNA is generally detectable in upper respiratory specimens during the acute phase of infection. The lowest concentration of SARS-CoV-2 viral copies this assay can detect is 138 copies/mL. A negative result does not preclude SARS-Cov-2 infection and should not be used as the sole basis for treatment or other patient management decisions. A negative result may occur with  improper specimen collection/handling, submission of specimen other than nasopharyngeal swab, presence of viral mutation(s) within the areas targeted by this assay, and inadequate number of viral copies(<138 copies/mL). A negative result must be combined with clinical observations, patient history, and epidemiological information. The expected result is Negative.  Fact Sheet for Patients:  EntrepreneurPulse.com.au  Fact Sheet for Healthcare Providers:  IncredibleEmployment.be  This test is no t yet approved or cleared by the [place] FDA and  has been authorized for detection and/or diagnosis of SARS-CoV-2 by FDA under  an Emergency Use Authorization (EUA). This EUA will remain  in effect (meaning this test can be used) for the duration of the COVID-19 declaration under Section 564(b)(1) of the Act, 21 U.S.C.section 360bbb-3(b)(1), unless the authorization is terminated  or revoked sooner.       Influenza A by PCR NEGATIVE NEGATIVE Final   Influenza B by PCR NEGATIVE NEGATIVE Final    Comment: (NOTE) The Xpert Xpress SARS-CoV-2/FLU/RSV plus assay is intended as an aid in the diagnosis of influenza from Nasopharyngeal swab specimens and should not be used as a sole basis for treatment. Nasal washings and aspirates are unacceptable for Xpert Xpress SARS-CoV-2/FLU/RSV testing.  Fact Sheet for Patients: EntrepreneurPulse.com.au  Fact Sheet for Healthcare Providers: IncredibleEmployment.be  This test is not yet approved or cleared by the [place] FDA and has been authorized for detection and/or diagnosis of SARS-CoV-2 by FDA under an Emergency Use Authorization (EUA). This EUA will remain in effect (meaning this test can be used) for the duration of the COVID-19 declaration under Section 564(b)(1) of the Act, 21 U.S.C. section 360bbb-3(b)(1), unless the authorization is terminated or revoked.  Performed at [facility], [place] [address] [place], [place] [address]   Culture, blood (routine x 2)     Status: None (Preliminary result)   Collection Time: 07/29/21  3:15 PM   Specimen: BLOOD RIGHT FOREARM  Result Value Ref Range Status   Specimen Description BLOOD RIGHT FOREARM  Final   Special Requests   Final    BOTTLES DRAWN AEROBIC AND ANAEROBIC Blood Culture results may not be optimal due to an inadequate volume of blood received in culture bottles   Culture   Final    NO GROWTH 3 DAYS  Performed at [facility], [place] [address], [place], [place] [address]    Report Status PENDING  Incomplete  Culture, blood (routine x 2)     Status: None  (Preliminary result)   Collection Time: 07/29/21  3:20 PM   Specimen: BLOOD RIGHT HAND  Result Value Ref Range Status   Specimen Description BLOOD RIGHT HAND  Final   Special Requests   Final    BOTTLES DRAWN AEROBIC AND ANAEROBIC Blood Culture results may not be optimal due to an inadequate volume of blood received in culture bottles   Culture   Final    NO GROWTH 3 DAYS Performed at [facility], [place] [address] [place], [place] [address]    Report Status PENDING  Incomplete  CSF culture w Gram Stain     Status: None (Preliminary result)   Collection Time: 07/29/21  6:12 PM   Specimen: CSF; Cerebrospinal Fluid  Result Value Ref Range Status   Specimen Description CSF  Final   Special Requests NONE  Final   Gram Stain NO WBC SEEN NO ORGANISMS SEEN CYTOSPIN SMEAR   Final   Culture   Final    NO GROWTH 3 DAYS Performed at [facility], [place] [address], [place], [place] [address]    Report Status PENDING  Incomplete  MRSA Next Gen by PCR, Nasal     Status: None   Collection Time: 07/29/21  7:03 PM  Result Value Ref Range Status   MRSA by PCR Next Gen NOT DETECTED NOT DETECTED Final    Comment: (NOTE) The GeneXpert MRSA Assay (FDA approved for NASAL specimens only), is one component of a comprehensive MRSA colonization surveillance program. It is not intended to diagnose MRSA infection nor to guide or monitor treatment for MRSA infections. Test performance is not FDA approved in patients less than 32 years old. Performed at [facility], [place] [address] [place],  [address]     Radiology Studies: MR BRAIN WO CONTRAST  Result Date: 08/01/2021 CLINICAL DATA:  Mental status change with unknown cause EXAM: MRI HEAD WITHOUT CONTRAST TECHNIQUE: Multiplanar, multiecho pulse sequences of the brain and surrounding structures were obtained without intravenous contrast. COMPARISON:  05/11/2021 FINDINGS: Brain: Tiny foci of restricted diffusion along the left  hippocampus. No confluent restricted diffusion or thalamic involvement highly suggestive of seizures. No hemorrhage, hydrocephalus, or masslike finding. Cerebral volume loss with ventriculomegaly. Limited chronic ischemic changes. No chronic blood products. Vascular: Normal flow voids. Skull and upper cervical spine: Normal marrow signal. Sinuses/Orbits: Unremarkable IMPRESSION: 1. Tiny foci of restricted diffusion along the left hippocampus, question transient global amnesia symptoms. 2. Generalized brain atrophy. Electronically Signed   By: Jorje Guild M.D.   On: 08/01/2021 10:58       Edwar Coe T. Uehling  If 7PM-7AM, please contact night-coverage [email or web address] 08/01/2021, 12:22 PM

## 2021-08-01 NOTE — Progress Notes (Signed)
Daily Progress Note   Patient Name: Randy [name]       Date: 08/01/2021 DOB: 1940/08/17  Age: 81 y.o. MRN#: [id number] Attending Physician: Mercy Riding, MD Primary Care Physician: Dorothyann Peng, NP Admit Date: 07/29/2021   HPI/Patient Profile: 81 y.o. male  with past medical history of DM2, OSA, HTN, alcohol abuse disorder, unsteady gait with recurrent falls, and cognitive impairment who presented to the emergency department on 07/29/2021 as a code stroke due to altered mental status and dysarthria. CT head without acute finding. He was febrile to 100.4. He was admitted to [facility] for acute encephalopathy. Lumbar puncture was performed and CSF was not consistent with bacterial meningitis.  Remains on acyclovir pending HSV PCR. MRI brain ordered but not done yet due to agitation and confusion.    Of note, history of progressive difficulty with memory loss, urinary incontinence, and gait disturbance raised suspicion for NPH with outpatient neurology. Bedside LP done in the ER had opening pressure of 36.5 but it was done with patient's legs and knees flexed which may not reflect the accurate pressure.   Subjective: Chart reviewed. Per neurology, EEG was normal and HSV PCR came back negative.   MRI was done this morning - no hemorrhage, hydrocephalus, or mass-like finding. It does show cerebral volume loss with ventriculomegaly. Per outpatient neurology note from 06/16/21, ventriculomegaly can be a normal finding with atrophy.  I went to see patient/Randy [name] and wife/Georganna at bedside. Dr. Cyndia Skeeters joins [place] at bedside and provides an update. MRI results reviewed; discussed that results are not consistent with NPH however this condition cannot be completely ruled out. Discussed that per neurology, additional  NPH work-up should be done outpatient.   Dr. Cyndia Skeeters states he will have patient re-evaluated by PT today to make sure he is appropriate to discharge home with HH/PT. Cathie Olden states that she wants to have Randy [name] at home as long as she can take care of him.   Also discussed the possibility of underlying cognitive impairment/dementia with unmasking/worsening during acute illness. Brief discussion was had regarding the diagnosis of dementia and its natural trajectory. Discussed that it is a progressive and non-curable illness that results in decreased ability to communicate, ambulate, swallow, and maintain continence.   I provided [place] with a copy of "hard choices" booklet and encouraged her to  call me with any questions. I also reached out to PT and asked they come by while Cathie Olden is present, so that they could address any questions/concerns she may have about Randy [name]'s mobility and safety at home.     Objective:  Physical Exam Constitutional:      General: He is not in acute distress. Pulmonary:     Effort: Pulmonary effort is normal.  Neurological:     Mental Status: He is alert and oriented to person, place, and time.     Motor: Weakness present.  Psychiatric:        Cognition and Memory: Memory is impaired.            Vital Signs: BP (!) 167/72 (BP Location: Right Arm)    Pulse 69    Temp 97.7 F (36.5 C) (Oral)    Resp 17    Wt 80.7 kg    SpO2 97%    BMI 27.05 kg/m  SpO2: SpO2: 97 % O2 Device: O2 Device: Room Air        Palliative Assessment/Data: PPS 50%       Palliative Care Assessment & Plan   Assessment: - acute encephalopathy - recurrent falls at home, physical deconditioning - AKI ruled out - controlled DM - uncontrolled HTN - alcohol abuse  Recommendations/Plan: Continue current care Patient has clinically improved, although diagnosis remains unclear Will need outpatient neurology follow-up  PT re-evaluation pending   Code Status:  DNR/DNI  Prognosis:  Unable to determine  Discharge Planning: Home with Home Health (likely)    Thank you for allowing the Palliative Medicine Team to assist in the care of this patient.  Total time: 36 minutes    Greater than 50%  of this time was spent counseling and coordinating care related to the above assessment and plan.  Lavena Bullion, NP  Please contact Palliative Medicine Team phone at [phone] for questions and concerns.

## 2021-08-01 NOTE — Evaluation (Addendum)
Occupational Therapy Evaluation Patient Details Name: Randy [name] MRN: [id number] DOB: August 20, 1940 Today's Date: 08/01/2021   History of Present Illness 81 yo admitted 1/26 with acute encephalopathy with report of fall 1/23. Head CT (-). PMHx: Dm, HTN, OSA, balance issues.  MRI showed: Tiny foci of restricted diffusion along the left hippocamp, Generalized brain atrophy.   Clinical Impression   Pt admitted with above. He demonstrates the below listed deficits and will benefit from continued OT to maximize safety and independence with BADLs.  Pt presents to OT with generalized weakness, decreased activity tolerance, impaired balance, and cognitive deficits.  He currently requires min guard assistance for ADLs and functional mobility.  He lives with his wife and they report he was mod I with all ADLs, including driving and yard work PTA, and was mod I for mobility.  He did have a history of falls PTA.  Recommend that he have direct supervision when he is up on his feet due to balance deficits, and supervision for medication management.  Recommend no driving  Recommend HHOT.        Recommendations for follow up therapy are one component of a multi-disciplinary discharge planning process, led by the attending physician.  Recommendations may be updated based on patient status, additional functional criteria and insurance authorization.   Follow Up Recommendations  Home health OT    Assistance Recommended at Discharge Frequent or constant Supervision/Assistance  Patient can return home with the following A little help with walking and/or transfers;A little help with bathing/dressing/bathroom;Direct supervision/assist for medications management;Direct supervision/assist for financial management;Assist for transportation;Assistance with cooking/housework;Help with stairs or ramp for entrance    Functional Status Assessment  Patient has had a recent decline in their functional status and  demonstrates the ability to make significant improvements in function in a reasonable and predictable amount of time.  Equipment Recommendations  None recommended by OT    Recommendations for Other Services       Precautions / Restrictions Precautions Precautions: Fall Precaution Comments: history of falls      Mobility Bed Mobility               General bed mobility comments: pt sitting up in recliner    Transfers Overall transfer level: Needs assistance Equipment used: Rolling walker (2 wheels) Transfers: Sit to/from Stand, Bed to chair/wheelchair/BSC Sit to Stand: Min guard     Step pivot transfers: Min guard            Balance Overall balance assessment: Needs assistance Sitting-balance support: No upper extremity supported, Feet supported Sitting balance-Leahy Scale: Good     Standing balance support: No upper extremity supported, During functional activity Standing balance-Leahy Scale: Fair                             ADL either performed or assessed with clinical judgement   ADL Overall ADL's : Needs assistance/impaired Eating/Feeding: Independent   Grooming: Wash/dry hands;Wash/dry face;Oral care;Brushing hair;Min guard;Standing   Upper Body Bathing: Set up;Sitting   Lower Body Bathing: Min guard;Sit to/from stand   Upper Body Dressing : Set up;Supervision/safety;Sitting   Lower Body Dressing: Min guard;Sit to/from stand   Toilet Transfer: Min guard;Ambulation;Comfort height toilet   Toileting- Clothing Manipulation and Hygiene: Min guard;Sit to/from stand     Tub/Shower Transfer Details (indicate cue type and reason): recommend pt sit to shower.  He does have a shower seat Functional mobility during ADLs: Min guard;Rolling walker (  2 wheels)       Vision Baseline Vision/History: 1 Wears glasses Ability to See in Adequate Light: 0 Adequate Patient Visual Report: No change from baseline Vision Assessment?: No apparent  visual deficits Additional Comments: grossly [facility]     Perception     Praxis      Pertinent Vitals/Pain Pain Assessment Pain Assessment: No/denies pain     Hand Dominance Right   Extremity/Trunk Assessment Upper Extremity Assessment Upper Extremity Assessment: Overall WFL for tasks assessed   Lower Extremity Assessment Lower Extremity Assessment: Defer to PT evaluation   Cervical / Trunk Assessment Cervical / Trunk Assessment: Normal   Communication Communication Communication: HOH   Cognition Arousal/Alertness: Awake/alert Behavior During Therapy: WFL for tasks assessed/performed Overall Cognitive Status: Impaired/Different from baseline Area of Impairment: Attention, Memory, Following commands, Safety/judgement, Awareness, Problem solving                   Current Attention Level: Selective, Alternating Memory: Decreased short-term memory Following Commands: Follows one step commands consistently, Follows multi-step commands inconsistently Safety/Judgement: Decreased awareness of safety, Decreased awareness of deficits Awareness: Intellectual Problem Solving: Slow processing, Difficulty sequencing, Requires verbal cues General Comments: Pt scored8 28 on the Short Blessed Test which is indicative of cognitive deficit - he demonstrated deficits with memory, attention, and sequencing. he demonstrates difficulty ambulating and performing mental calculations/manipulation simultaneously.  He was able to perform simple path finding on the unit.     General Comments  VSS.  Wife present.  Explained that pt will need someone with him when he is on his feet due to balance deficits and h/o falls.  CM present and discussed hiring assistance and discussed ALF if he continues to require supervision long term    Exercises     Shoulder Instructions      Home Living Family/patient expects to be discharged to:: Private residence Living Arrangements: Spouse/significant  other Available Help at Discharge: Family;Available 24 hours/day Type of Home: House Home Access: Stairs to enter [employer] of Steps: 3   Home Layout: One level     Bathroom Shower/Tub: Occupational psychologist: Handicapped height     Home Equipment: Conservation officer, nature (2 wheels)          Prior Functioning/Environment Prior Level of Function : Independent/Modified Independent             Mobility Comments: driving, mows the yard, walks without AD ADLs Comments: performs ADLs on his own and cooks.  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        OT Problem List: Decreased activity tolerance;Impaired balance (sitting and/or standing);Decreased cognition;Decreased safety awareness;Decreased knowledge of use of DME or AE      OT Treatment/Interventions: Self-care/ADL training;Neuromuscular education;DME and/or AE instruction;Therapeutic activities;Patient/family education;Balance training    OT Goals(Current goals can be found in the care plan section) Acute Rehab OT Goals Patient Stated Goal: to go home OT Goal Formulation: With patient/family Time For Goal Achievement: 08/15/21 Potential to Achieve Goals: Good ADL Goals Pt Will Perform Grooming: with supervision;standing Pt Will Perform Upper Body Dressing: with supervision;sitting Pt Will Perform Lower Body Dressing: with supervision;sit to/from stand Pt Will Transfer to Toilet: with supervision;ambulating;regular height toilet Pt Will Perform Toileting - Clothing Manipulation and hygiene: with supervision;sit to/from stand  OT Frequency: Min 2X/week    Co-evaluation              AM-PAC OT "6 Clicks"  Daily Activity     Outcome Measure Help from another person eating meals?: None Help from another person taking care of personal grooming?: A Little Help from another person toileting, which includes using toliet, bedpan, or urinal?: A  Little Help from another person bathing (including washing, rinsing, drying)?: A Little Help from another person to put on and taking off regular upper body clothing?: A Little Help from another person to put on and taking off regular lower body clothing?: A Little 6 Click Score: 19   End of Session Equipment Utilized During Treatment: Rolling walker (2 wheels);Gait belt Nurse Communication: Mobility status  Activity Tolerance: Patient tolerated treatment well Patient left: in chair;with call bell/phone within reach;with chair alarm set;with family/visitor present  OT Visit Diagnosis: Unsteadiness on feet (R26.81);Cognitive communication deficit (R41.841)                Time: 4193-7902 OT Time Calculation (min): 42 min Charges:  OT General Charges $OT Visit: 1 Visit OT Evaluation $OT Eval Moderate Complexity: 1 Mod OT Treatments $Therapeutic Activity: 23-37 mins  Nilsa Nutting., OTR/L Acute Rehabilitation Services Pager [phone] Office [place], [place] 08/01/2021, 3:47 PM

## 2021-08-02 LAB — CSF CULTURE W GRAM STAIN
Culture: NO GROWTH
Gram Stain: NONE SEEN

## 2021-08-02 LAB — GLUCOSE, CAPILLARY
Glucose-Capillary: 135 mg/dL — ABNORMAL HIGH (ref 70–99)
Glucose-Capillary: 155 mg/dL — ABNORMAL HIGH (ref 70–99)
Glucose-Capillary: 123 mg/dL — ABNORMAL HIGH (ref 70–99)
Glucose-Capillary: 157 mg/dL — ABNORMAL HIGH (ref 70–99)

## 2021-08-02 MED ORDER — CARVEDILOL 12.5 MG PO TABS
12.5000 mg | ORAL_TABLET | Freq: Two times a day (BID) | ORAL | Status: DC
  Administered 2021-08-02 – 2021-08-03 (×2): 12.5 mg via ORAL
  Filled 2021-08-02: qty 1

## 2021-08-02 MED ORDER — THIAMINE HCL 100 MG/ML IJ SOLN: 100.0000 mg | Freq: Every day | INTRAMUSCULAR | Status: DC

## 2021-08-02 MED ORDER — THIAMINE HCL 100 MG/ML IJ SOLN
250.0000 mg | Freq: Three times a day (TID) | INTRAVENOUS | Status: AC
  Administered 2021-08-02 – 2021-08-03 (×5): 250 mg via INTRAVENOUS
  Filled 2021-08-02 (×6): qty 2.5

## 2021-08-02 MED ORDER — AMLODIPINE BESYLATE 5 MG PO TABS
5.0000 mg | ORAL_TABLET | Freq: Every day | ORAL | Status: DC
  Administered 2021-08-02: 5 mg via ORAL
  Filled 2021-08-02: qty 1

## 2021-08-02 NOTE — Progress Notes (Signed)
Occupational Therapy Treatment Patient Details Name: Randy [name] MRN: [id number] DOB: 11-20-1940 Today's Date: 08/02/2021   History of present illness 81 yo admitted 1/26 with acute encephalopathy with report of fall 1/23. Head CT (-). PMHx: Dm, HTN, OSA, balance issues.  MRI showed: Tiny foci of restricted diffusion along the left hippocamp, Generalized brain atrophy.   OT comments  Randy [name] is progressing well. Pt demonstrated good ability to complete LB dressing with verbal cues for safety, encouraged to [name] LB clothing in sitting to prevent falls. Multiple sit<>stands this session with supervision overall. Pt ambulated within the room without AD given min guard for safety, noted mild unsteadiness. Removed condom cath to promote trips to the bathroom acutely, RN aware and agreeable. Reviewed fall prevention education: sit to shower, no throw rugs, lights on when OOB at night, ask for assistance, remove clutter; pt and his wife verbalized understanding. D/c recommendation remains appropriate, OT to continue to follow.    Recommendations for follow up therapy are one component of a multi-disciplinary discharge planning process, led by the attending physician.  Recommendations may be updated based on patient status, additional functional criteria and insurance authorization.    Follow Up Recommendations  Home health OT    Assistance Recommended at Discharge Frequent or constant Supervision/Assistance  Patient can return home with the following  A little help with walking and/or transfers;A little help with bathing/dressing/bathroom;Direct supervision/assist for medications management;Direct supervision/assist for financial management;Assist for transportation;Assistance with cooking/housework;Help with stairs or ramp for entrance   Equipment Recommendations  None recommended by OT       Precautions / Restrictions Precautions Precautions: Fall Precaution Comments: history of  falls Restrictions Weight Bearing Restrictions: No       Mobility Bed Mobility         General bed mobility comments: up in chair upon arrival    Transfers Overall transfer level: Needs assistance Equipment used: None Transfers: Sit to/from Stand Sit to Stand: Supervision, Min guard           General transfer comment: min guard initially for safety, progressed to supervision     Balance Overall balance assessment: Needs assistance Sitting-balance support: Feet supported Sitting balance-Leahy Scale: Good     Standing balance support: No upper extremity supported, During functional activity Standing balance-Leahy Scale: Fair Standing balance comment: stood at the sink to groom       ADL either performed or assessed with clinical judgement   ADL Overall ADL's : Needs assistance/impaired         Lower Body Dressing: Supervision/safety;Sit to/from stand Lower Body Dressing Details (indicate cue type and reason): pt donned underwear and pants, superivsion. 1 verbal cues for safety as pt attempted to [name] pants in standing. Toilet Transfer: Ambulation;Min Psychiatric nurse Details (indicate cue type and reason): Removed comdon cath to encourage trips to the bathroom. ambulated from chair>bathroom & stood to pee. min guard for safety.         Functional mobility during ADLs: Supervision/safety;Min guard General ADL Comments: Reviewed ADLs and compensatory techniques for safety. Pt has limited insight to deficits and benefits from verbal cues and min guard.    Extremity/Trunk Assessment Upper Extremity Assessment Upper Extremity Assessment: Overall WFL for tasks assessed   Lower Extremity Assessment Lower Extremity Assessment: Defer to PT evaluation        Vision   Vision Assessment?: No apparent visual deficits   Perception Perception Perception: Not tested       Cognition Arousal/Alertness: Awake/alert Behavior During  Therapy: WFL for tasks  assessed/performed Overall Cognitive Status: Impaired/Different from baseline Area of Impairment: Awareness, Safety/judgement, Problem solving           Safety/Judgement: Decreased awareness of safety, Decreased awareness of deficits Awareness: Intellectual Problem Solving: Slow processing, Requires verbal cues General Comments: overall WFL for BALDs and functional tasks. Limited insight to safety and deficits. When asked about concerns pt said "when can i have a shot of liquor." Wife reminding him the MD said not to drink. poor multitasking.              General Comments VSS on RA, wife present and supportive. Removed condom cath, RN aware    Pertinent Vitals/ Pain       Pain Assessment Pain Assessment: No/denies pain   Frequency  Min 2X/week        Progress Toward Goals  OT Goals(current goals can now be found in the care plan section)  Progress towards OT goals: Progressing toward goals  Acute Rehab OT Goals Patient Stated Goal: home tomorrow OT Goal Formulation: With patient/family Time For Goal Achievement: 08/15/21 Potential to Achieve Goals: Good ADL Goals Pt Will Perform Grooming: with supervision;standing Pt Will Perform Upper Body Dressing: with supervision;sitting Pt Will Perform Lower Body Dressing: with supervision;sit to/from stand Pt Will Transfer to Toilet: with supervision;ambulating;regular height toilet Pt Will Perform Toileting - Clothing Manipulation and hygiene: with supervision;sit to/from stand  Plan Discharge plan remains appropriate       AM-PAC OT "6 Clicks" Daily Activity     Outcome Measure   Help from another person eating meals?: None Help from another person taking care of personal grooming?: A Little Help from another person toileting, which includes using toliet, bedpan, or urinal?: A Little Help from another person bathing (including washing, rinsing, drying)?: A Little Help from another person to put on and taking off  regular upper body clothing?: A Little Help from another person to put on and taking off regular lower body clothing?: A Little 6 Click Score: 19    End of Session Equipment Utilized During Treatment: Gait belt  OT Visit Diagnosis: Unsteadiness on feet (R26.81);Cognitive communication deficit (R41.841)   Activity Tolerance Patient tolerated treatment well   Patient Left in chair;with call bell/phone within reach;with family/visitor present   Nurse Communication Mobility status        Time: 3382-5053 OT Time Calculation (min): 23 min  Charges: OT General Charges $OT Visit: 1 Visit OT Treatments $Self Care/Home Management : 23-37 mins   Elysa Womac A Ilyse Tremain 08/02/2021, 11:58 AM

## 2021-08-02 NOTE — Progress Notes (Signed)
Physical Therapy Treatment Patient Details Name: Randy [name] MRN: [id number] DOB: 18-Aug-1940 Today's Date: 08/02/2021   History of Present Illness 81 yo admitted 1/26 with acute encephalopathy with report of fall 1/23. Head CT (-). PMHx: Dm, HTN, OSA, balance issues.  MRI showed: Tiny foci of restricted diffusion along the left hippocamp, Generalized brain atrophy.    PT Comments    Much improved toward goals.  Still limited in ability to multitask mentally/physically.  Emphasis on DGI with 19/24 which suggest a risk of falling.  Pt's wife made aware of the deficits.    Recommendations for follow up therapy are one component of a multi-disciplinary discharge planning process, led by the attending physician.  Recommendations may be updated based on patient status, additional functional criteria and insurance authorization.  Follow Up Recommendations  Home health PT (switch to OPPT when not homebound)     Assistance Recommended at Discharge Intermittent Supervision/Assistance  Patient can return home with the following A little help with bathing/dressing/bathroom;Assist for transportation;Direct supervision/assist for financial management;Help with stairs or ramp for entrance;A little help with walking and/or transfers   Equipment Recommendations  BSC/3in1    Recommendations for Other Services       Precautions / Restrictions Precautions Precautions: Fall Precaution Comments: history of falls Restrictions Weight Bearing Restrictions: No     Mobility  Bed Mobility Overal bed mobility: Needs Assistance Bed Mobility: Supine to Sit           General bed mobility comments: up in chair upon arrival    Transfers Overall transfer level: Needs assistance Equipment used: None Transfers: Sit to/from Stand Sit to Stand: Supervision Stand pivot transfers: Supervision              Ambulation/Gait Ambulation/Gait assistance: Min guard, Supervision Gait Distance  (Feet): 500 Feet Assistive device: None Gait Pattern/deviations: Step-through pattern   Gait velocity interpretation: <1.8 ft/sec, indicate of risk for recurrent falls   General Gait Details: pt with slow shorter steps with inability to significantly increase gait speed quickly on command, slow to change direction, change tasks or ideas.   Stairs             Wheelchair Mobility    Modified Rankin (Stroke Patients Only) Modified Rankin (Stroke Patients Only) Pre-Morbid Rankin Score: No symptoms Modified Rankin: Moderate disability     Balance Overall balance assessment: Needs assistance Sitting-balance support: Feet supported Sitting balance-Leahy Scale: Good     Standing balance support: No upper extremity supported, During functional activity Standing balance-Leahy Scale: Fair                   Standardized Balance Assessment Standardized Balance Assessment : Dynamic Gait Index   Dynamic Gait Index Level Surface: Normal Change in Gait Speed: Mild Impairment Gait with Horizontal Head Turns: Mild Impairment Gait with Vertical Head Turns: Mild Impairment Gait and Pivot Turn: Normal Step Over Obstacle: Mild Impairment Step Around Obstacles: Normal Steps: Mild Impairment Total Score: 19      Cognition Arousal/Alertness: Awake/alert Behavior During Therapy: WFL for tasks assessed/performed Overall Cognitive Status: Impaired/Different from baseline                   Orientation Level: Time, Situation Current Attention Level: Selective Memory: Decreased short-term memory Following Commands: Follows one step commands consistently, Follows multi-step commands inconsistently, Follows one step commands with increased time Safety/Judgement: Decreased awareness of safety, Decreased awareness of deficits Awareness: Intellectual Problem Solving: Slow processing, Requires verbal cues  Exercises      General Comments General comments (skin  integrity, edema, etc.): VSS on RA, wife present and supportive. Removed condom cath, RN aware      Pertinent Vitals/Pain Pain Assessment Pain Assessment: No/denies pain    Home Living                          Prior Function            PT Goals (current goals can now be found in the care plan section) Acute Rehab PT Goals PT Goal Formulation: With patient/family Time For Goal Achievement: 08/13/21 Potential to Achieve Goals: Good    Frequency    Min 3X/week      PT Plan Current plan remains appropriate    Co-evaluation              AM-PAC PT "6 Clicks" Mobility   Outcome Measure  Help needed turning from your back to your side while in a flat bed without using bedrails?: A Little Help needed moving from lying on your back to sitting on the side of a flat bed without using bedrails?: A Little Help needed moving to and from a bed to a chair (including a wheelchair)?: A Little Help needed standing up from a chair using your arms (e.g., wheelchair or bedside chair)?: A Little Help needed to walk in hospital room?: A Little Help needed climbing 3-5 steps with a railing? : A Little 6 Click Score: 18    End of Session Equipment Utilized During Treatment: Gait belt Activity Tolerance: Patient tolerated treatment well Patient left: in chair;with call bell/phone within reach;with family/visitor present Nurse Communication: Mobility status PT Visit Diagnosis: Other abnormalities of gait and mobility (R26.89);Difficulty in walking, not elsewhere classified (R26.2);Muscle weakness (generalized) (M62.81);History of falling (Z91.81)     Time: 4081-4481 PT Time Calculation (min) (ACUTE ONLY): 19 min  Charges:  $Gait Training: 8-22 mins                     08/02/2021  Ginger Carne., PT Acute Rehabilitation Services [phone]  (pager) [phone]  (office)   Tessie Fass Lauraann Missey 08/02/2021, 2:13 PM

## 2021-08-02 NOTE — Progress Notes (Signed)
PROGRESS NOTE  Randy [name] WYO:[id number] DOB: October 05, 1940   PCP: Dorothyann Peng, NP  Patient is from: Home.  Lives with his wife.  DOA: 07/29/2021 LOS: 4  Chief complaints:  Chief Complaint  Patient presents with   Code Stroke     Brief Narrative / Interim history: 81 year old M with PMH of DM-2, OSA, unsteady gait, HTN, EtOH use, recurrent falls and cognitive decline brought to ED by EMS as code stroke due to altered mental status and dysarthria, and admitted for acute encephalopathy and concern for meningitis, and admitted to ICU.  He was febrile to 100.4.  CT head without acute finding.  He underwent lumbar puncture that.  Meningitis ruled out.  Antibiotics and acyclovir discontinued.  MRI brain: 1/29 with tiny foci of restricted diffusion along the left hippocampus and generalized brain atrophy.  EEG negative for epileptiform discharge or seizure.  Neurology started [facility] dose thiamine and recommended outpatient evaluation for possible NPH which was a concern when he was evaluated by his neurologist 3 months ago.  Patient's symptoms improved.  Likely home on 1/31 after he completes high-dose thiamine.   Subjective: Seen and examined earlier this morning.  No major events overnight of this morning.  No complaints.  Patient's wife at bedside.  Objective: Vitals:   08/02/21 0007 08/02/21 0409 08/02/21 0749 08/02/21 1127  BP: (!) 157/66 (!) 160/64 (!) 185/78 (!) 165/64  Pulse: 69 74 86 64  Resp: 16 16 16 16   Temp: 97.6 F (36.4 C) 97.8 F (36.6 C) 97.6 F (36.4 C) 98 F (36.7 C)  TempSrc: Oral Oral Oral Oral  SpO2: 93% 97% 96% 97%  Weight:        Examination:  GENERAL: No apparent distress.  Nontoxic. HEENT: MMM.  Vision and hearing grossly intact.  NECK: Supple.  No apparent JVD.  RESP: 97% on RA.  No IWOB.  Fair aeration bilaterally. CVS:  RRR. Heart sounds normal.  ABD/GI/GU: BS+. Abd soft, NTND.  MSK/EXT:  Moves extremities. No apparent deformity. No edema.   SKIN: no apparent skin lesion or wound NEURO: Awake and alert. Oriented appropriately.  No apparent focal neuro deficit. PSYCH: Calm. Normal affect.   Procedures:  1/27-lumbar puncture  Microbiology summarized: [id number] and influenza PCR nonreactive. Urine culture with multiple species. Blood cultures NGTD. CSF culture NGTD  Assessment & Plan: Acute Encephalopathy-bacterial meningitis ruled out.  Unsteady gait, confusion and urinary incontinence raises concern for NPH but he had elevated opening pressure to 35 cmH2O although LP was performed with patient's legs and knees flexed.  Other causes include cognitive impairment, alcohol withdrawal, ICU delirium or Warnicke encephalopathy.  MRI brain as above.  EEG, CTH, ammonia, B12, UDS, TSH and infectious work-up unrevealing so far.  No focal neurodeficit to suggest CVA.  Overall, encephalopathy improving. -Neurology started Warnicke dose thiamine and recommended outpatient follow-up for NPH evaluation -Reorientation and delirium precautions -Started on CIWA with as needed Ativan but did not require Ativan yet. -Fall precaution   Accidental fall at home/recurrent fall/physical deconditioning-could be from the above.  -Fall precaution  CKD-3A: AKI ruled out. Recent Labs    11/03/20 1539 07/29/21 1341 07/29/21 1346 07/30/21 0440 07/31/21 0729 08/01/21 0121  BUN 24* 21 24* 17 19 19   CREATININE 1.25 1.38* 1.40* 1.29* 1.31* 1.32*  -Monitor renal functions -Avoid nephrotoxic meds  Controlled NIDDM-2 with hyperglycemia: A1c 6.0%.  On metformin at home. Recent Labs  Lab 08/01/21 1130 08/01/21 1603 08/01/21 2019 08/02/21 0634 08/02/21 1130  GLUCAP 176* 147*  186* 135* 155*  -Continue SSI-sensitive -Continue monitoring  Uncontrolled hypertension/sinus tachycardia: BP elevated. Takes lisinopril/HCTZ at home -Increase Coreg to 12.5 mg twice daily -Started low-dose amlodipine. -Discontinued clonidine-could potentially worsen  encephalopathy. -IV metoprolol 2.5 mg every 3 hours as needed  OSA: On CPAP? -Try CPAP once encephalopathy improves  Alcohol abuse: Per patient's wife, drinks about 2 glasses of scotch almost every night. -Continue CIWA with as needed Ativan-did not require Ativan yet. -Continue vitamins.  Leukocytosis/bandemia: Likely demargination from steroid.  Resolved.  Goal of care counseling: Patient is DNR and DNI.   -Palliative medicine following for further goal of care discussion.  Body mass index is 27.05 kg/m.         DVT prophylaxis:  heparin injection 5,000 Units Start: 07/29/21 2200 SCDs Start: 07/29/21 1748  Code Status: DNR/DNI Family Communication: Updated patient's wife at bedside Level of care: Med-Surg.  Status is: Inpatient  Remains inpatient appropriate because: Encephalopathy requiring high-dose IV thiamine   Final disposition: Home with home health on 1/31.    Consultants:  Pulmonology admitted patient Neurology-signed off   Sch Meds:  Scheduled Meds:  amLODipine  5 mg Oral Daily   carvedilol  12.5 mg Oral BID WC   Chlorhexidine Gluconate Cloth  6 each Topical Daily   folic acid  1 mg Oral Daily   heparin  5,000 Units Subcutaneous Q8H   insulin aspart  0-9 Units Subcutaneous TID WC & HS   multivitamin with minerals  1 tablet Oral Daily   [START ON 08/04/2021] thiamine injection  100 mg Intravenous Daily   Continuous Infusions:  thiamine injection 250 mg (08/02/21 0845)   PRN Meds:.acetaminophen, LORazepam **OR** LORazepam, metoprolol tartrate, ondansetron (ZOFRAN) IV, polyethylene glycol  Antimicrobials: Anti-infectives (From admission, onward)    Start     Dose/Rate Route Frequency Ordered Stop   07/30/21 1800  vancomycin (VANCOCIN) IVPB 1000 mg/200 mL premix  Status:  Discontinued        1,000 mg 200 mL/hr over 60 Minutes Intravenous Every 24 hours 07/29/21 1950 07/30/21 0928   07/29/21 1830  ampicillin (OMNIPEN) 2 g in sodium chloride 0.9 %  100 mL IVPB  Status:  Discontinued        2 g 300 mL/hr over 20 Minutes Intravenous Every 6 hours 07/29/21 1743 07/30/21 0928   07/29/21 1827  acyclovir (ZOVIRAX) 790 mg in dextrose 5 % 150 mL IVPB  Status:  Discontinued        10 mg/kg  79 kg 165.8 mL/hr over 60 Minutes Intravenous Every 12 hours 07/29/21 1743 08/01/21 0723   07/29/21 1745  ampicillin (OMNIPEN) 2 g in sodium chloride 0.9 % 100 mL IVPB  Status:  Discontinued        2 g 300 mL/hr over 20 Minutes Intravenous Every 4 hours 07/29/21 1736 07/29/21 1747   07/29/21 1715  vancomycin (VANCOREADY) IVPB 1500 mg/300 mL        1,500 mg 150 mL/hr over 120 Minutes Intravenous  Once 07/29/21 1701 07/29/21 1922   07/29/21 1715  Ampicillin-Sulbactam (UNASYN) 3 g in sodium chloride 0.9 % 100 mL IVPB  Status:  Discontinued        3 g 200 mL/hr over 30 Minutes Intravenous Every 6 hours 07/29/21 1701 07/29/21 1701   07/29/21 1715  Ampicillin-Sulbactam (UNASYN) 3 g in sodium chloride 0.9 % 100 mL IVPB  Status:  Discontinued        3 g 200 mL/hr over 30 Minutes Intravenous Every 6 hours 07/29/21 1701  07/29/21 1735   07/29/21 1656  cefTRIAXone (ROCEPHIN) 2 g in sodium chloride 0.9 % 100 mL IVPB  Status:  Discontinued        2 g 200 mL/hr over 30 Minutes Intravenous Every 12 hours 07/29/21 1655 07/30/21 0928        I have personally reviewed the following labs and images: CBC: Recent Labs  Lab 07/29/21 1341 07/29/21 1346 07/30/21 0440 07/31/21 0729 08/01/21 0121  WBC 8.3  --  8.9 17.6* 10.2  NEUTROABS 5.5  --   --   --   --   HGB 14.4 15.6 14.7 15.3 14.0  HCT 44.3 46.0 43.4 44.7 42.6  MCV 101.6*  --  97.3 97.6 99.3  PLT 159  --  171 203 171   BMP &GFR Recent Labs  Lab 07/29/21 1341 07/29/21 1346 07/30/21 0440 07/31/21 0729 08/01/21 0121  NA 137 135 135 139 137  K 4.1 4.1 3.7 4.1 4.0  CL 95* 95* 97* 100 100  CO2 32  --  25 30 28   GLUCOSE 129* 121* 195* 138* 119*  BUN 21 24* 17 19 19   CREATININE 1.38* 1.40* 1.29* 1.31*  1.32*  CALCIUM 10.8*  --  10.2 10.3 9.7  MG  --   --  1.3* 2.0 1.8  PHOS  --   --  2.3* 3.0 3.6   Estimated Creatinine Clearance: 43.2 mL/min (A) (by C-G formula based on SCr of 1.32 mg/dL (H)). Liver & Pancreas: Recent Labs  Lab 07/29/21 1341 07/31/21 0729 08/01/21 0121  AST 18 29 20   ALT 15 17 15   ALKPHOS 65 56 44  BILITOT 1.0 0.8 0.7  PROT 6.4* 6.3* 5.4*  ALBUMIN 3.9 3.7 3.0*   3.1*   No results for input(s): LIPASE, AMYLASE in the last 168 hours. Recent Labs  Lab 07/30/21 0811 08/01/21 0121  AMMONIA 17 24   Diabetic: No results for input(s): HGBA1C in the last 72 hours.  Recent Labs  Lab 08/01/21 1130 08/01/21 1603 08/01/21 2019 08/02/21 0634 08/02/21 1130  GLUCAP 176* 147* 186* 135* 155*   Cardiac Enzymes: Recent Labs  Lab 07/29/21 1907 07/31/21 0729  CKTOTAL 99 287   No results for input(s): PROBNP in the last 8760 hours. Coagulation Profile: Recent Labs  Lab 07/29/21 1341  INR 0.9   Thyroid Function Tests: No results for input(s): TSH, T4TOTAL, FREET4, T3FREE, THYROIDAB in the last 72 hours.  Lipid Profile: No results for input(s): CHOL, HDL, LDLCALC, TRIG, CHOLHDL, LDLDIRECT in the last 72 hours. Anemia Panel: No results for input(s): VITAMINB12, FOLATE, FERRITIN, TIBC, IRON, RETICCTPCT in the last 72 hours.  Urine analysis:    Component Value Date/Time   COLORURINE YELLOW 07/29/2021 1513   APPEARANCEUR HAZY (A) 07/29/2021 1513   LABSPEC 1.008 07/29/2021 1513   PHURINE 9.0 (H) 07/29/2021 1513   GLUCOSEU NEGATIVE 07/29/2021 1513   GLUCOSEU NEGATIVE 11/03/2020 1539   HGBUR LARGE (A) 07/29/2021 1513   BILIRUBINUR NEGATIVE 07/29/2021 1513   BILIRUBINUR n 02/12/2019 1122   KETONESUR 5 (A) 07/29/2021 1513   PROTEINUR NEGATIVE 07/29/2021 1513   UROBILINOGEN 0.2 11/03/2020 1539   NITRITE NEGATIVE 07/29/2021 1513   LEUKOCYTESUR NEGATIVE 07/29/2021 1513   Sepsis Labs: Invalid input(s): PROCALCITONIN, [place]  Microbiology: Recent  Results (from the past 240 hour(s))  Urine Culture     Status: Abnormal   Collection Time: 07/29/21  1:36 PM   Specimen: Urine, Clean Catch  Result Value Ref Range Status   Specimen Description URINE, CLEAN  CATCH  Final   Special Requests   Final    NONE Performed at [facility], [place] [address] [place], [place] [address]    Culture (A)  Final    10,000 COLONIES/mL MULTIPLE SPECIES PRESENT, SUGGEST RECOLLECTION   Report Status 07/30/2021 FINAL  Final  Resp Panel by RT-PCR (Flu A&B, Covid) Nasopharyngeal Swab     Status: None   Collection Time: 07/29/21  1:41 PM   Specimen: Nasopharyngeal Swab; Nasopharyngeal(NP) swabs in vial transport medium  Result Value Ref Range Status   SARS Coronavirus 2 by RT PCR NEGATIVE NEGATIVE Final    Comment: (NOTE) SARS-CoV-2 target nucleic acids are NOT DETECTED.  The SARS-CoV-2 RNA is generally detectable in upper respiratory specimens during the acute phase of infection. The lowest concentration of SARS-CoV-2 viral copies this assay can detect is 138 copies/mL. A negative result does not preclude SARS-Cov-2 infection and should not be used as the sole basis for treatment or other patient management decisions. A negative result may occur with  improper specimen collection/handling, submission of specimen other than nasopharyngeal swab, presence of viral mutation(s) within the areas targeted by this assay, and inadequate number of viral copies(<138 copies/mL). A negative result must be combined with clinical observations, patient history, and epidemiological information. The expected result is Negative.  Fact Sheet for Patients:  EntrepreneurPulse.com.au  Fact Sheet for Healthcare Providers:  IncredibleEmployment.be  This test is no t yet approved or cleared by the [place] FDA and  has been authorized for detection and/or diagnosis of SARS-CoV-2 by FDA under an Emergency Use Authorization (EUA).  This EUA will remain  in effect (meaning this test can be used) for the duration of the COVID-19 declaration under Section 564(b)(1) of the Act, 21 U.S.C.section 360bbb-3(b)(1), unless the authorization is terminated  or revoked sooner.       Influenza A by PCR NEGATIVE NEGATIVE Final   Influenza B by PCR NEGATIVE NEGATIVE Final    Comment: (NOTE) The Xpert Xpress SARS-CoV-2/FLU/RSV plus assay is intended as an aid in the diagnosis of influenza from Nasopharyngeal swab specimens and should not be used as a sole basis for treatment. Nasal washings and aspirates are unacceptable for Xpert Xpress SARS-CoV-2/FLU/RSV testing.  Fact Sheet for Patients: EntrepreneurPulse.com.au  Fact Sheet for Healthcare Providers: IncredibleEmployment.be  This test is not yet approved or cleared by the [place] FDA and has been authorized for detection and/or diagnosis of SARS-CoV-2 by FDA under an Emergency Use Authorization (EUA). This EUA will remain in effect (meaning this test can be used) for the duration of the COVID-19 declaration under Section 564(b)(1) of the Act, 21 U.S.C. section 360bbb-3(b)(1), unless the authorization is terminated or revoked.  Performed at [facility], [place] [address], [place], [place] [address]   Culture, blood (routine x 2)     Status: None (Preliminary result)   Collection Time: 07/29/21  3:15 PM   Specimen: BLOOD RIGHT FOREARM  Result Value Ref Range Status   Specimen Description BLOOD RIGHT FOREARM  Final   Special Requests   Final    BOTTLES DRAWN AEROBIC AND ANAEROBIC Blood Culture results may not be optimal due to an inadequate volume of blood received in culture bottles   Culture   Final    NO GROWTH 4 DAYS Performed at [facility], [place] [address] [place], [place] [address]    Report Status PENDING  Incomplete  Culture, blood (routine x 2)     Status: None (  Preliminary result)   Collection Time:  07/29/21  3:20 PM   Specimen: BLOOD RIGHT HAND  Result Value Ref Range Status   Specimen Description BLOOD RIGHT HAND  Final   Special Requests   Final    BOTTLES DRAWN AEROBIC AND ANAEROBIC Blood Culture results may not be optimal due to an inadequate volume of blood received in culture bottles   Culture   Final    NO GROWTH 4 DAYS Performed at [facility], [place] [address], [place], [place] [address]    Report Status PENDING  Incomplete  CSF culture w Gram Stain     Status: None   Collection Time: 07/29/21  6:12 PM   Specimen: CSF; Cerebrospinal Fluid  Result Value Ref Range Status   Specimen Description CSF  Final   Special Requests NONE  Final   Gram Stain NO WBC SEEN NO ORGANISMS SEEN CYTOSPIN SMEAR   Final   Culture   Final    NO GROWTH Performed at [facility], [place] [address], [place], [place] [address]    Report Status 08/02/2021 FINAL  Final  MRSA Next Gen by PCR, Nasal     Status: None   Collection Time: 07/29/21  7:03 PM  Result Value Ref Range Status   MRSA by PCR Next Gen NOT DETECTED NOT DETECTED Final    Comment: (NOTE) The GeneXpert MRSA Assay (FDA approved for NASAL specimens only), is one component of a comprehensive MRSA colonization surveillance program. It is not intended to diagnose MRSA infection nor to guide or monitor treatment for MRSA infections. Test performance is not FDA approved in patients less than 53 years old. Performed at [facility], [place] [address], [place], [place] [address]     Radiology Studies: No results found.     Shakeita Vandevander T. [place]  If 7PM-7AM, please contact night-coverage [email or web address] 08/02/2021, 1:35 PM

## 2021-08-02 NOTE — TOC CAGE-AID Note (Signed)
Transition of Care [facility]) - CAGE-AID Screening   Patient Details  Name: Randy [name] MRN: [id number] Date of Birth: 1940-11-22  [facility] of [facility]) CM/SW Contact:    Pollie Friar, RN Phone Number: 08/02/2021, 11:07 AM   Clinical Narrative: Patient denied need for inpatient/ outpatient alcohol counseling.   CAGE-AID Screening:    Have You Ever Felt You Ought to Cut Down on Your Drinking or Drug Use?: Yes Have People Annoyed You By Critizing Your Drinking Or Drug Use?: No Have You Felt Bad Or Guilty About Your Drinking Or Drug Use?: No Have You Ever Had a Drink or Used Drugs First Thing In The Morning to Steady Your Nerves or to Get Rid of a Hangover?: No CAGE-AID Score: 1  Substance Abuse Education Offered: Yes (pt refused)

## 2021-08-02 NOTE — Care Management Important Message (Signed)
Important Message  Patient Details  Name: Randy [name] MRN: [id number] Date of Birth: 03-21-1941   Medicare Important Message Given:  Yes     [name] 08/02/2021, 2:04 PM

## 2021-08-03 DIAGNOSIS — E1165 Type 2 diabetes mellitus with hyperglycemia: Secondary | ICD-10-CM

## 2021-08-03 DIAGNOSIS — R5381 Other malaise: Secondary | ICD-10-CM

## 2021-08-03 DIAGNOSIS — N1831 Chronic kidney disease, stage 3a: Secondary | ICD-10-CM

## 2021-08-03 DIAGNOSIS — W19XXXA Unspecified fall, initial encounter: Secondary | ICD-10-CM

## 2021-08-03 DIAGNOSIS — G9341 Metabolic encephalopathy: Secondary | ICD-10-CM

## 2021-08-03 LAB — CULTURE, BLOOD (ROUTINE X 2)
Culture: NO GROWTH
Culture: NO GROWTH

## 2021-08-03 LAB — GLUCOSE, CAPILLARY
Glucose-Capillary: 123 mg/dL — ABNORMAL HIGH (ref 70–99)
Glucose-Capillary: 155 mg/dL — ABNORMAL HIGH (ref 70–99)

## 2021-08-03 MED ORDER — FOLIC ACID 1 MG PO TABS: 1.0000 mg | ORAL_TABLET | Freq: Every day | ORAL | 0 refills | Status: DC

## 2021-08-03 MED ORDER — ACETAMINOPHEN 325 MG PO TABS: 650.0000 mg | ORAL_TABLET | ORAL | Status: DC | PRN

## 2021-08-03 MED ORDER — LISINOPRIL-HYDROCHLOROTHIAZIDE 20-12.5 MG PO TABS: 1.0000 | ORAL_TABLET | Freq: Every day | ORAL | 0 refills | Status: DC

## 2021-08-03 MED ORDER — ADULT MULTIVITAMIN W/MINERALS CH: 1.0000 | ORAL_TABLET | Freq: Every day | ORAL | 1 refills | Status: AC

## 2021-08-03 MED ORDER — THIAMINE HCL 100 MG PO TABS: 100.0000 mg | ORAL_TABLET | Freq: Every day | ORAL | 1 refills | Status: AC

## 2021-08-03 MED ORDER — CARVEDILOL 6.25 MG PO TABS
6.2500 mg | ORAL_TABLET | Freq: Two times a day (BID) | ORAL | Status: DC
  Filled 2021-08-03: qty 1

## 2021-08-03 MED ORDER — CARVEDILOL 6.25 MG PO TABS: 6.2500 mg | ORAL_TABLET | Freq: Two times a day (BID) | ORAL | 1 refills | Status: DC

## 2021-08-03 MED ORDER — AMLODIPINE BESYLATE 10 MG PO TABS
10.0000 mg | ORAL_TABLET | Freq: Every day | ORAL | Status: DC
  Administered 2021-08-03: 10 mg via ORAL
  Filled 2021-08-03: qty 1

## 2021-08-03 NOTE — Progress Notes (Signed)
° °  CC:  hospital follow up for AMS  Follow-up Visit  Last visit: 06/16/21  Brief HPI: 81 year old male with a history of HTN, DM, OSA who follows in clinic for imbalance and memory issues. MRI L-spine was normal. MRI brain with mild-moderate atrophy and mild-moderate ventriculomegaly.  At his last visit, lumbar drain trial to assess for NPH was discussed, but patient declined at that time.  Interval History: One week ago he went to pick up a package, lost his balance and fell backwards. Hit his head and seemed okay for the next couple of days. Three days later he developed a headache and confusion. He does not remember his hospitalization. States he was having hallucinations of people turning into animals and patterns on the walls. Hallucinations have mostly improved, he will occasionally see rain/snow which isn't there. Cognition is still not back to baseline. Wife had to dress him this morning. He is continuing to have urinary incontinence which is getting worse.  He was admitted to the hospital 07/29/21 for altered mental status and fever. WBC was 17 and Cr was 1.4. MRI brain showed a tiny focus of restricted diffusion along the left hippocampus. EEG was normal. LP showed elevated opening pressure (35), though there was concern that positioning may have caused a falsely elevated reading. CSF with 0 WBC, 273 RBC, 76 glucose, 54 protein, negative bacterial culture and HSV. He was seen by Neurology inpatient who started IV thiamine for possible Wernicke's encephalopathy as there was concern for ETOH abuse. He has stopped drinking since his hospitalization. Has not started oral thiamine yet.   Physical Exam:   Vital Signs: BP (!) 147/70    Pulse 68    Ht 5\' 8"  (1.727 m)    Wt 170 lb (77.1 kg)    BMI 25.85 kg/m  GENERAL:  well appearing, in no acute distress, alert  SKIN:  Color, texture, turgor normal. No rashes or lesions HEAD:  Normocephalic/atraumatic. RESP: normal respiratory effort MSK:   No gross joint deformities.   NEUROLOGICAL: Mental Status: Alert, oriented to person, place and time, Follows commands, and Speech fluent and appropriate. Cranial Nerves: PERRL, face symmetric, no dysarthria, hearing grossly intact Motor: moves all extremities equally, no tremor Gait: narrow-based, shuffling gait  IMPRESSION: 81 year old male with a history of HTN, DM, OSA who presents for follow up of imbalance and cognitive changes. Suspect recent episode of altered mental status was multifactorial. MRI findings are suggestive of possible transient global amnesia. Visual hallucinations in the hospital may have been secondary to alcohol withdrawal given frequent alcohol use at home. He was also noted to have elevated white count and decreased renal function on admission which can contribute to confusion. His cognition has improved significantly since returning home, though it is not completely back to baseline. Discussed lumbar drain trial to assess for underlying NPH, which he is agreeable to. Referral to Neurosurgery placed.   PLAN: -Start daily oral thiamine supplements -Referral to Neurosurgery for potential lumbar drain trial  Follow-up: 6 months  I spent a total of 38 minutes on the date of the service. Discussed medication side effects, adverse reactions and drug interactions. Written educational materials and patient instructions outlining all of the above were given.  Genia Harold, MD 08/04/21 11:46 AM

## 2021-08-03 NOTE — Plan of Care (Signed)
Pt has met all goals. Discharged to home with [facility] for continued PT/OT therapies. Wife/family at his side.

## 2021-08-03 NOTE — Discharge Summary (Signed)
Physician Discharge Summary  Randy [name] PNT:[id number] DOB: 20-Jan-1941 DOA: 07/29/2021  PCP: Dorothyann Peng, NP  Admit date: 07/29/2021 Discharge date: 08/03/2021 Admitted From: Home Disposition: Home Recommendations for Outpatient Follow-up:  Outpatient follow-up with PCP in 1 to 2 weeks Patient has upcoming appointment with his neurologist tomorrow Please obtain CBC/BMP/Mag at follow up Please follow up on the following pending results: None Home Health: PT/OT/SW Equipment/Devices: None Discharge Condition: Stable CODE STATUS: DNR/DNI  Follow-up Information     [facility] Follow up.   Why: The home health agency will contact you for the first home visit Contact information: [place] Hospital Course: 81 year old M with PMH of DM-2, OSA, unsteady gait, HTN, EtOH use, recurrent falls and cognitive decline brought to ED by EMS as code stroke due to altered mental status and dysarthria, and admitted for acute encephalopathy and concern for meningitis, and admitted to ICU.  He was febrile to 100.4.  CT head without acute finding.  He underwent lumbar puncture that.  Meningitis ruled out.  Antibiotics and acyclovir discontinued.  MRI brain: 1/29 with tiny foci of restricted diffusion along the left hippocampus and generalized brain atrophy.  EEG negative for epileptiform discharge or seizure.  Neurology started [facility] dose thiamine and recommended outpatient evaluation for possible NPH which was a concern when he was evaluated by his neurologist 3 months ago.   Patient completed Warnicke dose thiamine.  Symptoms improved.  Cleared for discharge for outpatient follow-up by neurology.  Home health ordered as recommended by therapy.  See individual problem list below for more on hospital course.  Discharge Diagnoses:  Acute metabolic encephalopathy-bacterial meningitis ruled out.  Unsteady gait, confusion and urinary incontinence raises concern for  NPH but he had elevated opening pressure to 35 cmH2O although LP was performed with patient's legs and knees flexed.  Other causes include cognitive impairment, alcohol withdrawal, ICU delirium or Warnicke encephalopathy.  MRI brain as above.  EEG, CTH, ammonia, B12, UDS, TSH and infectious work-up unrevealing so far.  No focal neurodeficit to suggest CVA.  Completed high-dose thiamine.  Encephalopathy resolved. -Continue with p.o. thiamine 100 mg daily -Encouraged alcohol cessation -Outpatient follow-up with neurology  Accidental fall at home/recurrent fall/physical deconditioning-could be from the above.  -Fall precaution   CKD-3A: AKI ruled out. -Recheck renal function in 1 to 2 weeks.   Controlled NIDDM-2 with hyperglycemia: A1c 6.0%.  On metformin at home. -Continue home medications   Uncontrolled hypertension/sinus tachycardia: BP elevated but improved. -Started Coreg 6.25 mg twice daily -Advised to hold home lisinopril/HCTZ for 3 more days.   Alcohol abuse: Per patient's wife, drinks about 2 glasses of scotch almost every night. -Encourage alcohol cessation.   Leukocytosis/bandemia: Likely demargination from steroid.  Resolved.   Goal of care counseling: Patient is DNR and DNI.   -Palliative medicine following for further goal of care discussion.  Body mass index is 25.38 kg/m.           Discharge Exam: Vitals:   08/03/21 0700 08/03/21 0800 08/03/21 0807 08/03/21 1130  BP:   (!) 160/83 138/67  Pulse:   74 60  Temp:   98.3 F (36.8 C) 98.2 F (36.8 C)  Resp:   18 18  Weight:      SpO2: 94% 97% 97% 95%  TempSrc:    Oral     GENERAL: No apparent distress.  Nontoxic. HEENT: MMM.  Vision and  hearing grossly intact.  NECK: Supple.  No apparent JVD.  RESP: 95% on RA.  No IWOB.  Fair aeration bilaterally. CVS:  RRR. Heart sounds normal.  ABD/GI/GU: Bowel sounds present. Soft. Non tender.  MSK/EXT:  Moves extremities. No apparent deformity. No edema.  SKIN: no  apparent skin lesion or wound NEURO: Awake and alert.  Oriented appropriately.  No apparent focal neuro deficit. PSYCH: Calm. Normal affect.   Discharge Instructions  Discharge Instructions     Call MD for:  difficulty breathing, headache or visual disturbances   Complete by: As directed    Call MD for:  extreme fatigue   Complete by: As directed    Call MD for:  persistant dizziness or light-headedness   Complete by: As directed    Diet - low sodium heart healthy   Complete by: As directed    Diet Carb Modified   Complete by: As directed    Discharge instructions   Complete by: As directed    It has been a pleasure taking care of you!  You were hospitalized due to confusion and recurrent fall.  Not quite clear what caused the symptoms but your symptoms improved to the point we think it is safe to let you go home and follow-up with your primary care doctor and neurologist for further evaluation.  Meanwhile, we strongly recommend not drinking alcohol.  We also recommend taking your medications as prescribed.  Please review your new medication list and the directions on your medications before you take them.  Follow-up with your primary care doctor in 1 to 2 weeks.  Follow-up with your neurologist as previously planned   Take care,   Increase activity slowly   Complete by: As directed       Allergies as of 08/03/2021       Reactions   Drug Ingredient [atorvastatin] Other (See Comments)   Muscle pain        Medication List     STOP taking these medications    ibuprofen 200 MG tablet Commonly known as: ADVIL       TAKE these medications    acetaminophen 325 MG tablet Commonly known as: TYLENOL Take 2 tablets (650 mg total) by mouth every 4 (four) hours as needed for mild pain (temp > 101.5).   carvedilol 6.25 MG tablet Commonly known as: COREG Take 1 tablet (6.25 mg total) by mouth 2 (two) times daily with a meal.   citalopram 10 MG tablet Commonly known as:  CELEXA Take 1 tablet (10 mg total) by mouth daily. What changed: when to take this   folic acid 1 MG tablet Commonly known as: FOLVITE Take 1 tablet (1 mg total) by mouth daily.   lisinopril-hydrochlorothiazide 20-12.5 MG tablet Commonly known as: ZESTORETIC Take 1 tablet by mouth daily. Start taking on: August 06, 2021 What changed: These instructions start on August 06, 2021. If you are unsure what to do until then, ask your doctor or other care provider.   metFORMIN 1000 MG tablet Commonly known as: GLUCOPHAGE TAKE ONE TABLET BY MOUTH DAILY WITH BREAKFAST   multivitamin with minerals Tabs tablet Take 1 tablet by mouth daily.   thiamine 100 MG tablet Take 1 tablet (100 mg total) by mouth daily.   True Metrix Air Glucose Meter w/Device Kit 1 each by Does not apply route daily.   True Metrix Blood Glucose Test test strip Generic drug: glucose blood USE TO TEST BLOOD GLUCOSE TWICE DAILY.   True Metrix Level  1 Low Soln USE TO CHECK METER PRIOR TO TESTING SUGAR   TRUEplus Lancets 33G Misc USE TO CHECK BLOOD SUGAR DAILY AND PRN   VITAMIN B-12 PO Take 1,000 mcg by mouth daily.   VITAMIN D-3 PO Take 1 capsule by mouth daily.        Consultations: Neurology Pulmonology  Procedures/Studies: Lumbar puncture on 1/27   MR BRAIN WO CONTRAST  Result Date: 08/01/2021 CLINICAL DATA:  Mental status change with unknown cause EXAM: MRI HEAD WITHOUT CONTRAST TECHNIQUE: Multiplanar, multiecho pulse sequences of the brain and surrounding structures were obtained without intravenous contrast. COMPARISON:  05/11/2021 FINDINGS: Brain: Tiny foci of restricted diffusion along the left hippocampus. No confluent restricted diffusion or thalamic involvement highly suggestive of seizures. No hemorrhage, hydrocephalus, or masslike finding. Cerebral volume loss with ventriculomegaly. Limited chronic ischemic changes. No chronic blood products. Vascular: Normal flow voids. Skull and upper  cervical spine: Normal marrow signal. Sinuses/Orbits: Unremarkable IMPRESSION: 1. Tiny foci of restricted diffusion along the left hippocampus, question transient global amnesia symptoms. 2. Generalized brain atrophy. Electronically Signed   By: Jorje Guild M.D.   On: 08/01/2021 10:58   DG CHEST PORT 1 VIEW  Result Date: 07/29/2021 CLINICAL DATA:  Stroke, acute encephalopathy.  Shortness of breath. EXAM: PORTABLE CHEST 1 VIEW COMPARISON:  11/21/2019 FINDINGS: The heart size and mediastinal contours are within normal limits. Both lungs are clear. The visualized skeletal structures are unremarkable. IMPRESSION: Normal study. Electronically Signed   By: Rolm Baptise M.D.   On: 07/29/2021 18:40   EEG adult  Result Date: 07/30/2021 Lora Havens, MD     07/31/2021  8:12 AM Patient Name: ASSAD HARBESON MRN: [id number] Epilepsy Attending: Lora Havens Referring Physician/Provider: Dr Amie [place] Date: 07/30/2021 Duration: 21.41 mins Patient history: 81 year old man with altered mental status word-finding difficulty and possible visual deficits noted at home with acute change. EEG to evaluate for seizure. Level of alertness: Awake AEDs during EEG study: None Technical aspects: This EEG study was done with scalp electrodes positioned according to the 10-20 International system of electrode placement. Electrical activity was acquired at a sampling rate of 500Hz  and reviewed with a high frequency filter of 70Hz  and a low frequency filter of 1Hz . EEG data were recorded continuously and digitally stored. Description: The posterior dominant rhythm consists of 8 Hz activity of moderate voltage (25-35 uV) seen predominantly in posterior head regions, symmetric and reactive to eye opening and eye closing. Hyperventilation and photic stimulation were not performed.   IMPRESSION: This study is within normal limits. No seizures or epileptiform discharges were seen throughout the recording. Lora Havens   CT  HEAD CODE STROKE WO CONTRAST  Result Date: 07/29/2021 CLINICAL DATA:  Code stroke. Neuro deficit, acute, stroke suspected. EXAM: CT HEAD WITHOUT CONTRAST TECHNIQUE: Contiguous axial images were obtained from the base of the skull through the vertex without intravenous contrast. RADIATION DOSE REDUCTION: This exam was performed according to the departmental dose-optimization program which includes automated exposure control, adjustment of the mA and/or kV according to patient size and/or use of iterative reconstruction technique. COMPARISON:  MRI 05/11/2021 FINDINGS: Brain: Background pattern of age related volume loss. No focal abnormality seen affecting the brainstem or cerebellum. Cerebral hemispheres show mild chronic small-vessel ischemic changes of the white matter. No sign of cortical or large vessel territory infarction, mass, hemorrhage, hydrocephalus or extra-axial collection. Vascular: There is atherosclerotic calcification of the major vessels at the base of the brain. Skull: Negative Sinuses/Orbits: Clear/normal  Other: None ASPECTS ([place] Stroke Program Early CT Score) - Ganglionic level infarction (caudate, lentiform nuclei, internal capsule, insula, M1-M3 cortex): 7 - Supraganglionic infarction (M4-M6 cortex): 3 Total score (0-10 with 10 being normal): 10 IMPRESSION: 1. No acute CT finding. Age related atrophy. Mild chronic small-vessel ischemic change of the white matter. 2. ASPECTS is 10. 3. These results were communicated to Dr. Rory Percy at 1:56 pm on 07/29/2021 by text page via the [facility] messaging system. Electronically Signed   By: Nelson Chimes M.D.   On: 07/29/2021 13:57       The results of significant diagnostics from this hospitalization (including imaging, microbiology, ancillary and laboratory) are listed below for reference.     Microbiology: Recent Results (from the past 240 hour(s))  Urine Culture     Status: Abnormal   Collection Time: 07/29/21  1:36 PM   Specimen: Urine,  Clean Catch  Result Value Ref Range Status   Specimen Description URINE, CLEAN CATCH  Final   Special Requests   Final    NONE Performed at [facility], [address] [place], [place] [address]    Culture (A)  Final    10,000 COLONIES/mL MULTIPLE SPECIES PRESENT, SUGGEST RECOLLECTION   Report Status 07/30/2021 FINAL  Final  Resp Panel by RT-PCR (Flu A&B, Covid) Nasopharyngeal Swab     Status: None   Collection Time: 07/29/21  1:41 PM   Specimen: Nasopharyngeal Swab; Nasopharyngeal(NP) swabs in vial transport medium  Result Value Ref Range Status   SARS Coronavirus 2 by RT PCR NEGATIVE NEGATIVE Final    Comment: (NOTE) SARS-CoV-2 target nucleic acids are NOT DETECTED.  The SARS-CoV-2 RNA is generally detectable in upper respiratory specimens during the acute phase of infection. The lowest concentration of SARS-CoV-2 viral copies this assay can detect is 138 copies/mL. A negative result does not preclude SARS-Cov-2 infection and should not be used as the sole basis for treatment or other patient management decisions. A negative result may occur with  improper specimen collection/handling, submission of specimen other than nasopharyngeal swab, presence of viral mutation(s) within the areas targeted by this assay, and inadequate number of viral copies(<138 copies/mL). A negative result must be combined with clinical observations, patient history, and epidemiological information. The expected result is Negative.  Fact Sheet for Patients:  EntrepreneurPulse.com.au  Fact Sheet for Healthcare Providers:  IncredibleEmployment.be  This test is no t yet approved or cleared by the [place] FDA and  has been authorized for detection and/or diagnosis of SARS-CoV-2 by FDA under an Emergency Use Authorization (EUA). This EUA will remain  in effect (meaning this test can be used) for the duration of the COVID-19 declaration under Section  564(b)(1) of the Act, 21 U.S.C.section 360bbb-3(b)(1), unless the authorization is terminated  or revoked sooner.       Influenza A by PCR NEGATIVE NEGATIVE Final   Influenza B by PCR NEGATIVE NEGATIVE Final    Comment: (NOTE) The Xpert Xpress SARS-CoV-2/FLU/RSV plus assay is intended as an aid in the diagnosis of influenza from Nasopharyngeal swab specimens and should not be used as a sole basis for treatment. Nasal washings and aspirates are unacceptable for Xpert Xpress SARS-CoV-2/FLU/RSV testing.  Fact Sheet for Patients: EntrepreneurPulse.com.au  Fact Sheet for Healthcare Providers: IncredibleEmployment.be  This test is not yet approved or cleared by the [place] FDA and has been authorized for detection and/or diagnosis of SARS-CoV-2 by FDA under an Emergency Use Authorization (EUA). This EUA will remain in effect (meaning  this test can be used) for the duration of the COVID-19 declaration under Section 564(b)(1) of the Act, 21 U.S.C. section 360bbb-3(b)(1), unless the authorization is terminated or revoked.  Performed at [facility], [place] [address] [place], [place] [address]   Culture, blood (routine x 2)     Status: None   Collection Time: 07/29/21  3:15 PM   Specimen: BLOOD RIGHT FOREARM  Result Value Ref Range Status   Specimen Description BLOOD RIGHT FOREARM  Final   Special Requests   Final    BOTTLES DRAWN AEROBIC AND ANAEROBIC Blood Culture results may not be optimal due to an inadequate volume of blood received in culture bottles   Culture   Final    NO GROWTH 5 DAYS Performed at [facility], [place] [address] [place], [place] [address]    Report Status 08/03/2021 FINAL  Final  Culture, blood (routine x 2)     Status: None   Collection Time: 07/29/21  3:20 PM   Specimen: BLOOD RIGHT HAND  Result Value Ref Range Status   Specimen Description BLOOD RIGHT HAND  Final   Special Requests   Final     BOTTLES DRAWN AEROBIC AND ANAEROBIC Blood Culture results may not be optimal due to an inadequate volume of blood received in culture bottles   Culture   Final    NO GROWTH 5 DAYS Performed at [facility], [place] [address], [place], [place] [address]    Report Status 08/03/2021 FINAL  Final  CSF culture w Gram Stain     Status: None   Collection Time: 07/29/21  6:12 PM   Specimen: CSF; Cerebrospinal Fluid  Result Value Ref Range Status   Specimen Description CSF  Final   Special Requests NONE  Final   Gram Stain NO WBC SEEN NO ORGANISMS SEEN CYTOSPIN SMEAR   Final   Culture   Final    NO GROWTH Performed at [facility], [place] [address], [place], [place] [address]    Report Status 08/02/2021 FINAL  Final  MRSA Next Gen by PCR, Nasal     Status: None   Collection Time: 07/29/21  7:03 PM  Result Value Ref Range Status   MRSA by PCR Next Gen NOT DETECTED NOT DETECTED Final    Comment: (NOTE) The GeneXpert MRSA Assay (FDA approved for NASAL specimens only), is one component of a comprehensive MRSA colonization surveillance program. It is not intended to diagnose MRSA infection nor to guide or monitor treatment for MRSA infections. Test performance is not FDA approved in patients less than 6 years old. Performed at [facility], [place] [address], [place],  [address]      Labs:  CBC: Recent Labs  Lab 07/29/21 1341 07/29/21 1346 07/30/21 0440 07/31/21 0729 08/01/21 0121  WBC 8.3  --  8.9 17.6* 10.2  NEUTROABS 5.5  --   --   --   --   HGB 14.4 15.6 14.7 15.3 14.0  HCT 44.3 46.0 43.4 44.7 42.6  MCV 101.6*  --  97.3 97.6 99.3  PLT 159  --  171 203 171   BMP &GFR Recent Labs  Lab 07/29/21 1341 07/29/21 1346 07/30/21 0440 07/31/21 0729 08/01/21 0121  NA 137 135 135 139 137  K 4.1 4.1 3.7 4.1 4.0  CL 95* 95* 97* 100 100  CO2 32  --  25 30 28   GLUCOSE 129* 121* 195* 138* 119*  BUN 21  24* 17 19 19   CREATININE 1.38* 1.40* 1.29* 1.31* 1.32*   CALCIUM 10.8*  --  10.2 10.3 9.7  MG  --   --  1.3* 2.0 1.8  PHOS  --   --  2.3* 3.0 3.6   Estimated Creatinine Clearance: 43.2 mL/min (A) (by C-G formula based on SCr of 1.32 mg/dL (H)). Liver & Pancreas: Recent Labs  Lab 07/29/21 1341 07/31/21 0729 08/01/21 0121  AST 18 29 20   ALT 15 17 15   ALKPHOS 65 56 44  BILITOT 1.0 0.8 0.7  PROT 6.4* 6.3* 5.4*  ALBUMIN 3.9 3.7 3.0*   3.1*   No results for input(s): LIPASE, AMYLASE in the last 168 hours. Recent Labs  Lab 07/30/21 0811 08/01/21 0121  AMMONIA 17 24   Diabetic: No results for input(s): HGBA1C in the last 72 hours. Recent Labs  Lab 08/02/21 1130 08/02/21 1632 08/02/21 2132 08/03/21 0638 08/03/21 1132  GLUCAP 155* 123* 157* 123* 155*   Cardiac Enzymes: Recent Labs  Lab 07/29/21 1907 07/31/21 0729  CKTOTAL 99 287   No results for input(s): PROBNP in the last 8760 hours. Coagulation Profile: Recent Labs  Lab 07/29/21 1341  INR 0.9   Thyroid Function Tests: No results for input(s): TSH, T4TOTAL, FREET4, T3FREE, THYROIDAB in the last 72 hours. Lipid Profile: No results for input(s): CHOL, HDL, LDLCALC, TRIG, CHOLHDL, LDLDIRECT in the last 72 hours. Anemia Panel: No results for input(s): VITAMINB12, FOLATE, FERRITIN, TIBC, IRON, RETICCTPCT in the last 72 hours. Urine analysis:    Component Value Date/Time   COLORURINE YELLOW 07/29/2021 1513   APPEARANCEUR HAZY (A) 07/29/2021 1513   LABSPEC 1.008 07/29/2021 1513   PHURINE 9.0 (H) 07/29/2021 1513   GLUCOSEU NEGATIVE 07/29/2021 1513   GLUCOSEU NEGATIVE 11/03/2020 1539   HGBUR LARGE (A) 07/29/2021 1513   BILIRUBINUR NEGATIVE 07/29/2021 1513   BILIRUBINUR n 02/12/2019 1122   KETONESUR 5 (A) 07/29/2021 1513   PROTEINUR NEGATIVE 07/29/2021 1513   UROBILINOGEN 0.2 11/03/2020 1539   NITRITE NEGATIVE 07/29/2021 1513   LEUKOCYTESUR NEGATIVE 07/29/2021 1513   Sepsis Labs: Invalid input(s): PROCALCITONIN, LACTICIDVEN   Time coordinating discharge: 45  minutes  SIGNED:  Mercy Riding, MD  [facility] 08/03/2021, 1:47 PM

## 2021-08-03 NOTE — TOC Transition Note (Signed)
Transition of Care [facility]) - CM/SW Discharge Note   Patient Details  Name: WARNELL RASNIC MRN: [id number] Date of Birth: 1940/08/07  Transition of Care [facility]) CM/SW Contact:  Pollie Friar, RN Phone Number: 08/03/2021, 10:07 AM   Clinical Narrative:    Patient is discharging home with spouse. [facility] aware of d/c home today.  Wife has arranged for caregivers in the home.  Pt has transport home.   Final next level of care: [place] Barriers to Discharge: No Barriers Identified   Patient Goals and CMS Choice Patient states their goals for this hospitalization and ongoing recovery are:: get stronger and back to baseline needs CMS Medicare.gov Compare Post Acute Care list provided to:: Patient Choice offered to / list presented to : Patient, Spouse  Discharge Placement                       Discharge Plan and Services In-house Referral: Hospice / Palliative Care Discharge Planning Services: CM Consult Post Acute Care Choice: Home Health                    HH Arranged: PT, OT, Social Work [facility] Agency: [facility] Date [facility] Agency Contacted: 08/01/21 Time HH Agency Contacted: 69 Representative spoke with at [place]: [place] ([place]) Interventions     Readmission Risk Interventions No flowsheet data found.

## 2021-08-04 ENCOUNTER — Other Ambulatory Visit: Payer: Self-pay

## 2021-08-04 ENCOUNTER — Encounter: Payer: Self-pay | Admitting: Psychiatry

## 2021-08-04 ENCOUNTER — Telehealth: Payer: Self-pay

## 2021-08-04 ENCOUNTER — Ambulatory Visit: Payer: Medicare PPO | Admitting: Psychiatry

## 2021-08-04 ENCOUNTER — Telehealth: Payer: Self-pay | Admitting: Psychiatry

## 2021-08-04 VITALS — BP 147/70 | HR 68 | Ht 68.0 in | Wt 170.0 lb

## 2021-08-04 DIAGNOSIS — R2689 Other abnormalities of gait and mobility: Secondary | ICD-10-CM | POA: Diagnosis not present

## 2021-08-04 DIAGNOSIS — R4189 Other symptoms and signs involving cognitive functions and awareness: Secondary | ICD-10-CM

## 2021-08-04 DIAGNOSIS — G912 (Idiopathic) normal pressure hydrocephalus: Secondary | ICD-10-CM

## 2021-08-04 NOTE — Telephone Encounter (Signed)
Transition Care Management Unsuccessful Follow-up Telephone Call  Date of discharge and from where:  [facility] 08-03-21 Dx: Acute metabolic encephalopathy   Attempts:  1st Attempt  Reason for unsuccessful TCM follow-up call:  Unable to leave message

## 2021-08-04 NOTE — Patient Instructions (Signed)
Transient global amnesia

## 2021-08-04 NOTE — Telephone Encounter (Signed)
Sent to [facility] ph # [phone]. 

## 2021-08-05 ENCOUNTER — Encounter: Payer: Self-pay | Admitting: Adult Health

## 2021-08-05 NOTE — Telephone Encounter (Signed)
FYI

## 2021-08-11 DIAGNOSIS — I10 Essential (primary) hypertension: Secondary | ICD-10-CM | POA: Diagnosis not present

## 2021-08-11 DIAGNOSIS — G912 (Idiopathic) normal pressure hydrocephalus: Secondary | ICD-10-CM | POA: Diagnosis not present

## 2021-08-13 ENCOUNTER — Other Ambulatory Visit: Payer: Self-pay | Admitting: Neurological Surgery

## 2021-08-16 ENCOUNTER — Encounter (HOSPITAL_COMMUNITY): Payer: Self-pay | Admitting: Neurological Surgery

## 2021-08-16 ENCOUNTER — Other Ambulatory Visit: Payer: Self-pay

## 2021-08-16 NOTE — Progress Notes (Signed)
Interview done with the wife Cathie Olden due to the pt's increased confusion in recent weeks from the present illness.  PCP - Dr. Harless Litten  Cardiologist - Denies  EP- Denies  Endocrine- Denies  Pulm- Denies  Chest x-ray - 07/29/21 (E)  EKG - 07/29/21 (E)  Stress Test - Denies  ECHO - Denies  Cardiac Cath - Denies  AICD- na PM- na LOOP- na  Nerve Stimulator- Denies  Dialysis- Denies  Sleep Study - Yes- Positive CPAP - Denies  LABS- 07/31/21 (E): CMP 08/01/21 (e): CBC  ASA- Denies  ERAS- Yes- clears until 1030  HA1C- 07/29/21 (E): 6.0 Fasting Blood Sugar - 110-140 Checks Blood Sugar ___1__ every other day  Anesthesia- No  Cathie Olden denies the pt having chest pain, sob, or fever during the pre-op phone call. All instructions explained to [place], with a verbal understanding of the material including: as of today, stop taking all Aspirin (unless instructed by your doctor) and Other Aspirin containing products, Vitamins, Fish oils, and Herbal medications. Also stop all NSAIDS i.e. Advil, Ibuprofen, Motrin, Aleve, Anaprox, Naproxen, BC, Goody Powders, and all Supplements.   WHAT DO I DO ABOUT MY DIABETES MEDICATION?  Do not take Glucophage(Metformin) the morning of surgery.  If your CBG is greater than 220 mg/dL, call the number below for further instructions.  How do I manage my blood sugar before surgery? Check your blood sugar the morning of your surgery when you wake up and every 2 hours until you get to the Short Stay unit. If your blood sugar is less than 70 mg/dL, you will need to treat for low blood sugar: Do not take insulin. Treat a low blood sugar (less than 70 mg/dL) with  cup of clear juice (cranberry or apple), 4 glucose tablets, OR glucose gel. Recheck blood sugar in 15 minutes after treatment (to make sure it is greater than 70 mg/dL). If your blood sugar is not greater than 70 mg/dL on recheck, call [phone]  for further instructions. Report  your blood sugar to the short stay nurse when you get to Short Stay.  Reviewed and Endorsed by [facility] Patient Education Committee, August 2015  Cathie Olden also instructed to have them wear a mask and social distance if they go out. The opportunity to ask questions was provided.    Coronavirus Screening  Have you experienced the following symptoms:  Cough yes/no: No Fever (>100.1F)  yes/no: No Runny nose yes/no: No Sore throat yes/no: No Difficulty breathing/shortness of breath  yes/no: No  Have you or a family member traveled in the last 14 days and where? yes/no: No   If the patient indicates "YES" to the above questions, their PAT will be rescheduled to limit the exposure to others and, the surgeon will be notified. THE PATIENT WILL NEED TO BE ASYMPTOMATIC FOR 14 DAYS.   If the patient is not experiencing any of these symptoms, the PAT nurse will instruct them to NOT bring anyone with them to their appointment since they may have these symptoms or traveled as well.   Please remind your patients and families that hospital visitation restrictions are in effect and the importance of the restrictions.

## 2021-08-17 ENCOUNTER — Ambulatory Visit (HOSPITAL_COMMUNITY): Payer: Medicare PPO | Admitting: Anesthesiology

## 2021-08-17 ENCOUNTER — Other Ambulatory Visit: Payer: Self-pay

## 2021-08-17 ENCOUNTER — Encounter (HOSPITAL_COMMUNITY)
Admission: RE | Disposition: A | Discharge status: Home / Self Care | Payer: Self-pay | Source: Home / Self Care | Attending: Neurological Surgery

## 2021-08-17 ENCOUNTER — Encounter (HOSPITAL_COMMUNITY): Payer: Self-pay | Admitting: Neurological Surgery

## 2021-08-17 ENCOUNTER — Ambulatory Visit (HOSPITAL_COMMUNITY): Payer: Medicare PPO

## 2021-08-17 ENCOUNTER — Inpatient Hospital Stay (HOSPITAL_COMMUNITY)
Admission: RE | Admit: 2021-08-17 | Discharge: 2021-08-18 | DRG: 057 | Disposition: A | Discharge status: Home / Self Care | Payer: Medicare PPO | Attending: Neurological Surgery | Admitting: Neurological Surgery

## 2021-08-17 DIAGNOSIS — Z823 Family history of stroke: Secondary | ICD-10-CM

## 2021-08-17 DIAGNOSIS — Z20822 Contact with and (suspected) exposure to covid-19: Secondary | ICD-10-CM | POA: Diagnosis not present

## 2021-08-17 DIAGNOSIS — G912 (Idiopathic) normal pressure hydrocephalus: Secondary | ICD-10-CM | POA: Diagnosis not present

## 2021-08-17 DIAGNOSIS — Z833 Family history of diabetes mellitus: Secondary | ICD-10-CM

## 2021-08-17 DIAGNOSIS — I10 Essential (primary) hypertension: Secondary | ICD-10-CM | POA: Diagnosis present

## 2021-08-17 DIAGNOSIS — E119 Type 2 diabetes mellitus without complications: Secondary | ICD-10-CM | POA: Diagnosis present

## 2021-08-17 DIAGNOSIS — Z7984 Long term (current) use of oral hypoglycemic drugs: Secondary | ICD-10-CM | POA: Diagnosis not present

## 2021-08-17 DIAGNOSIS — Z419 Encounter for procedure for purposes other than remedying health state, unspecified: Secondary | ICD-10-CM

## 2021-08-17 DIAGNOSIS — Z87891 Personal history of nicotine dependence: Secondary | ICD-10-CM | POA: Diagnosis not present

## 2021-08-17 DIAGNOSIS — G919 Hydrocephalus, unspecified: Secondary | ICD-10-CM | POA: Diagnosis not present

## 2021-08-17 DIAGNOSIS — Z8249 Family history of ischemic heart disease and other diseases of the circulatory system: Secondary | ICD-10-CM

## 2021-08-17 DIAGNOSIS — G4733 Obstructive sleep apnea (adult) (pediatric): Secondary | ICD-10-CM | POA: Diagnosis present

## 2021-08-17 DIAGNOSIS — Z808 Family history of malignant neoplasm of other organs or systems: Secondary | ICD-10-CM | POA: Diagnosis not present

## 2021-08-17 HISTORY — PX: PLACEMENT OF LUMBAR DRAIN: SHX6028

## 2021-08-17 LAB — GLUCOSE, CAPILLARY
Glucose-Capillary: 103 mg/dL — ABNORMAL HIGH (ref 70–99)
Glucose-Capillary: 114 mg/dL — ABNORMAL HIGH (ref 70–99)
Glucose-Capillary: 115 mg/dL — ABNORMAL HIGH (ref 70–99)
Glucose-Capillary: 123 mg/dL — ABNORMAL HIGH (ref 70–99)
Glucose-Capillary: 138 mg/dL — ABNORMAL HIGH (ref 70–99)
Glucose-Capillary: 151 mg/dL — ABNORMAL HIGH (ref 70–99)

## 2021-08-17 LAB — SARS CORONAVIRUS 2 BY RT PCR (HOSPITAL ORDER, PERFORMED IN ~~LOC~~ HOSPITAL LAB): SARS Coronavirus 2: NEGATIVE

## 2021-08-17 LAB — SURGICAL PCR SCREEN
MRSA, PCR: NEGATIVE
Staphylococcus aureus: NEGATIVE

## 2021-08-17 SURGERY — PLACEMENT OF LUMBAR DRAIN
Anesthesia: General

## 2021-08-17 MED ORDER — HYDROCODONE-ACETAMINOPHEN 5-325 MG PO TABS
1.0000 | ORAL_TABLET | ORAL | Status: DC | PRN
  Administered 2021-08-17 – 2021-08-18 (×3): 1 via ORAL
  Filled 2021-08-17 (×3): qty 1

## 2021-08-17 MED ORDER — ONDANSETRON HCL 4 MG PO TABS: 4.0000 mg | ORAL_TABLET | ORAL | Status: DC | PRN

## 2021-08-17 MED ORDER — METFORMIN HCL 500 MG PO TABS
1000.0000 mg | ORAL_TABLET | Freq: Every day | ORAL | Status: DC
  Administered 2021-08-18: 1000 mg via ORAL
  Filled 2021-08-17: qty 2

## 2021-08-17 MED ORDER — CHLORHEXIDINE GLUCONATE 0.12 % MT SOLN: 15.0000 mL | Freq: Once | OROMUCOSAL | Status: AC

## 2021-08-17 MED ORDER — LISINOPRIL 20 MG PO TABS
20.0000 mg | ORAL_TABLET | Freq: Every day | ORAL | Status: DC
  Administered 2021-08-17 – 2021-08-18 (×2): 20 mg via ORAL
  Filled 2021-08-17 (×2): qty 1

## 2021-08-17 MED ORDER — INSULIN ASPART 100 UNIT/ML IJ SOLN
0.0000 [IU] | Freq: Three times a day (TID) | INTRAMUSCULAR | Status: DC
  Administered 2021-08-17: 2 [IU] via SUBCUTANEOUS
  Administered 2021-08-18: 5 [IU] via SUBCUTANEOUS
  Administered 2021-08-18: 1 [IU] via SUBCUTANEOUS

## 2021-08-17 MED ORDER — LISINOPRIL-HYDROCHLOROTHIAZIDE 20-12.5 MG PO TABS: 1.0000 | ORAL_TABLET | Freq: Every day | ORAL | Status: DC

## 2021-08-17 MED ORDER — CITALOPRAM HYDROBROMIDE 10 MG PO TABS
10.0000 mg | ORAL_TABLET | Freq: Every morning | ORAL | Status: DC
  Administered 2021-08-17 – 2021-08-18 (×2): 10 mg via ORAL
  Filled 2021-08-17 (×2): qty 1

## 2021-08-17 MED ORDER — PROMETHAZINE HCL 25 MG PO TABS: 12.5000 mg | ORAL_TABLET | ORAL | Status: DC | PRN

## 2021-08-17 MED ORDER — FOLIC ACID 1 MG PO TABS
1.0000 mg | ORAL_TABLET | Freq: Every day | ORAL | Status: DC
  Administered 2021-08-17 – 2021-08-18 (×2): 1 mg via ORAL
  Filled 2021-08-17 (×2): qty 1

## 2021-08-17 MED ORDER — HYDROCHLOROTHIAZIDE 12.5 MG PO TABS
12.5000 mg | ORAL_TABLET | Freq: Every day | ORAL | Status: DC
  Administered 2021-08-17 – 2021-08-18 (×2): 12.5 mg via ORAL
  Filled 2021-08-17 (×2): qty 1

## 2021-08-17 MED ORDER — THIAMINE HCL 100 MG PO TABS
100.0000 mg | ORAL_TABLET | Freq: Every day | ORAL | Status: DC
  Administered 2021-08-17 – 2021-08-18 (×2): 100 mg via ORAL
  Filled 2021-08-17 (×2): qty 1

## 2021-08-17 MED ORDER — HYDROMORPHONE HCL 1 MG/ML IJ SOLN: 0.5000 mg | INTRAMUSCULAR | Status: DC | PRN

## 2021-08-17 MED ORDER — ACETAMINOPHEN 650 MG RE SUPP: 650.0000 mg | RECTAL | Status: DC | PRN

## 2021-08-17 MED ORDER — LIDOCAINE-EPINEPHRINE 1 %-1:100000 IJ SOLN
INTRAMUSCULAR | Status: AC
  Filled 2021-08-17: qty 1

## 2021-08-17 MED ORDER — ONDANSETRON HCL 4 MG/2ML IJ SOLN: 4.0000 mg | INTRAMUSCULAR | Status: DC | PRN

## 2021-08-17 MED ORDER — POLYETHYLENE GLYCOL 3350 17 G PO PACK: 17.0000 g | PACK | Freq: Every day | ORAL | Status: DC | PRN

## 2021-08-17 MED ORDER — OXYCODONE HCL 5 MG/5ML PO SOLN: 5.0000 mg | Freq: Once | ORAL | Status: DC | PRN

## 2021-08-17 MED ORDER — LIDOCAINE 2% (20 MG/ML) 5 ML SYRINGE
INTRAMUSCULAR | Status: DC | PRN
  Administered 2021-08-17: 60 mg via INTRAVENOUS

## 2021-08-17 MED ORDER — CARVEDILOL 3.125 MG PO TABS
6.2500 mg | ORAL_TABLET | Freq: Two times a day (BID) | ORAL | Status: DC
  Administered 2021-08-18: 6.25 mg via ORAL
  Filled 2021-08-17 (×2): qty 2

## 2021-08-17 MED ORDER — GLYCOPYRROLATE 0.2 MG/ML IJ SOLN
INTRAMUSCULAR | Status: DC | PRN
  Administered 2021-08-17: .2 mg via INTRAVENOUS

## 2021-08-17 MED ORDER — ORAL CARE MOUTH RINSE: 15.0000 mL | Freq: Once | OROMUCOSAL | Status: AC

## 2021-08-17 MED ORDER — ADULT MULTIVITAMIN W/MINERALS CH
1.0000 | ORAL_TABLET | Freq: Every day | ORAL | Status: DC
  Administered 2021-08-17 – 2021-08-18 (×2): 1 via ORAL
  Filled 2021-08-17 (×2): qty 1

## 2021-08-17 MED ORDER — FENTANYL CITRATE (PF) 100 MCG/2ML IJ SOLN: 25.0000 ug | INTRAMUSCULAR | Status: DC | PRN

## 2021-08-17 MED ORDER — ROCURONIUM BROMIDE 10 MG/ML (PF) SYRINGE
PREFILLED_SYRINGE | INTRAVENOUS | Status: DC | PRN
Start: 2021-08-17 — End: 2021-08-17
  Administered 2021-08-17: 60 mg via INTRAVENOUS

## 2021-08-17 MED ORDER — LIDOCAINE 2% (20 MG/ML) 5 ML SYRINGE
INTRAMUSCULAR | Status: AC
  Filled 2021-08-17: qty 5

## 2021-08-17 MED ORDER — PHENYLEPHRINE 40 MCG/ML (10ML) SYRINGE FOR IV PUSH (FOR BLOOD PRESSURE SUPPORT)
PREFILLED_SYRINGE | INTRAVENOUS | Status: DC | PRN
  Administered 2021-08-17 (×4): 80 ug via INTRAVENOUS

## 2021-08-17 MED ORDER — PROPOFOL 10 MG/ML IV BOLUS
INTRAVENOUS | Status: DC | PRN
  Administered 2021-08-17: 100 mg via INTRAVENOUS

## 2021-08-17 MED ORDER — GLYCOPYRROLATE PF 0.2 MG/ML IJ SOSY
PREFILLED_SYRINGE | INTRAMUSCULAR | Status: AC
  Filled 2021-08-17: qty 1

## 2021-08-17 MED ORDER — CHLORHEXIDINE GLUCONATE CLOTH 2 % EX PADS: 6.0000 | MEDICATED_PAD | Freq: Once | CUTANEOUS | Status: DC

## 2021-08-17 MED ORDER — SUGAMMADEX SODIUM 200 MG/2ML IV SOLN
INTRAVENOUS | Status: DC | PRN
  Administered 2021-08-17: 350 mg via INTRAVENOUS

## 2021-08-17 MED ORDER — OXYCODONE HCL 5 MG PO TABS: 5.0000 mg | ORAL_TABLET | Freq: Once | ORAL | Status: DC | PRN

## 2021-08-17 MED ORDER — ROCURONIUM BROMIDE 10 MG/ML (PF) SYRINGE
PREFILLED_SYRINGE | INTRAVENOUS | Status: AC
  Filled 2021-08-17: qty 10

## 2021-08-17 MED ORDER — CEFAZOLIN SODIUM-DEXTROSE 2-4 GM/100ML-% IV SOLN
2.0000 g | INTRAVENOUS | Status: AC
  Administered 2021-08-17: 2 g via INTRAVENOUS
  Filled 2021-08-17: qty 100

## 2021-08-17 MED ORDER — DOCUSATE SODIUM 100 MG PO CAPS
100.0000 mg | ORAL_CAPSULE | Freq: Two times a day (BID) | ORAL | Status: DC
  Administered 2021-08-17 – 2021-08-18 (×2): 100 mg via ORAL
  Filled 2021-08-17: qty 1

## 2021-08-17 MED ORDER — ONDANSETRON HCL 4 MG/2ML IJ SOLN
INTRAMUSCULAR | Status: DC | PRN
  Administered 2021-08-17: 4 mg via INTRAVENOUS

## 2021-08-17 MED ORDER — LIDOCAINE-EPINEPHRINE 1 %-1:100000 IJ SOLN
INTRAMUSCULAR | Status: DC | PRN
Start: 2021-08-17 — End: 2021-08-17
  Administered 2021-08-17: 10 mL

## 2021-08-17 MED ORDER — CHLORHEXIDINE GLUCONATE 0.12 % MT SOLN
OROMUCOSAL | Status: AC
  Administered 2021-08-17: 15 mL
  Filled 2021-08-17: qty 15

## 2021-08-17 MED ORDER — FENTANYL CITRATE (PF) 250 MCG/5ML IJ SOLN
INTRAMUSCULAR | Status: DC | PRN
  Administered 2021-08-17: 100 ug via INTRAVENOUS

## 2021-08-17 MED ORDER — VITAMIN B-12 1000 MCG PO TABS
1000.0000 ug | ORAL_TABLET | Freq: Every day | ORAL | Status: DC
  Administered 2021-08-17 – 2021-08-18 (×2): 1000 ug via ORAL
  Filled 2021-08-17 (×2): qty 1

## 2021-08-17 MED ORDER — 0.9 % SODIUM CHLORIDE (POUR BTL) OPTIME
TOPICAL | Status: DC | PRN
Start: 2021-08-17 — End: 2021-08-17
  Administered 2021-08-17: 1000 mL

## 2021-08-17 MED ORDER — LACTATED RINGERS IV SOLN: INTRAVENOUS | Status: DC

## 2021-08-17 MED ORDER — FENTANYL CITRATE (PF) 250 MCG/5ML IJ SOLN
INTRAMUSCULAR | Status: AC
  Filled 2021-08-17: qty 5

## 2021-08-17 MED ORDER — PROPOFOL 10 MG/ML IV BOLUS
INTRAVENOUS | Status: AC
  Filled 2021-08-17: qty 20

## 2021-08-17 MED ORDER — INSULIN ASPART 100 UNIT/ML IJ SOLN: 0.0000 [IU] | INTRAMUSCULAR | Status: DC | PRN

## 2021-08-17 MED ORDER — ACETAMINOPHEN 325 MG PO TABS
650.0000 mg | ORAL_TABLET | ORAL | Status: DC | PRN
  Administered 2021-08-17 – 2021-08-18 (×2): 650 mg via ORAL
  Filled 2021-08-17 (×2): qty 2

## 2021-08-17 MED ORDER — ONDANSETRON HCL 4 MG/2ML IJ SOLN: 4.0000 mg | Freq: Once | INTRAMUSCULAR | Status: DC | PRN

## 2021-08-17 SURGICAL SUPPLY — 34 items
BAG COUNTER SPONGE SURGICOUNT (BAG) ×2 IMPLANT
CATH LUMBAR HERMETIC 14G (CATHETERS) IMPLANT
CATHETER LUMBAR HERMETIC 14G (CATHETERS) ×2
DERMABOND ADVANCED (GAUZE/BANDAGES/DRESSINGS) ×1
DERMABOND ADVANCED .7 DNX12 (GAUZE/BANDAGES/DRESSINGS) ×1 IMPLANT
DRAIN SUBARACHNOID (WOUND CARE) ×1 IMPLANT
DRAPE C-ARM 42X72 X-RAY (DRAPES) ×2 IMPLANT
DRAPE HALF SHEET 40X57 (DRAPES) ×2 IMPLANT
DRAPE LAPAROTOMY 100X72X124 (DRAPES) ×2 IMPLANT
DRSG TEGADERM 4X10 (GAUZE/BANDAGES/DRESSINGS) ×1 IMPLANT
DURAPREP 26ML APPLICATOR (WOUND CARE) ×2 IMPLANT
GAUZE 4X4 16PLY ~~LOC~~+RFID DBL (SPONGE) ×2 IMPLANT
GLOVE SURG LTX SZ7.5 (GLOVE) IMPLANT
GLOVE SURG UNDER POLY LF SZ7.5 (GLOVE) ×4 IMPLANT
GOWN STRL REUS W/ TWL LRG LVL3 (GOWN DISPOSABLE) ×1 IMPLANT
GOWN STRL REUS W/ TWL XL LVL3 (GOWN DISPOSABLE) IMPLANT
GOWN STRL REUS W/TWL 2XL LVL3 (GOWN DISPOSABLE) IMPLANT
GOWN STRL REUS W/TWL LRG LVL3 (GOWN DISPOSABLE) ×2
GOWN STRL REUS W/TWL XL LVL3 (GOWN DISPOSABLE) ×1
KIT BASIN OR (CUSTOM PROCEDURE TRAY) ×2 IMPLANT
KIT DRAIN CSF ACCUDRAIN (MISCELLANEOUS) IMPLANT
KIT TURNOVER KIT B (KITS) ×2 IMPLANT
NDL HYPO 25X1 1.5 SAFETY (NEEDLE) ×1 IMPLANT
NEEDLE HYPO 25X1 1.5 SAFETY (NEEDLE) ×2 IMPLANT
NS IRRIG 1000ML POUR BTL (IV SOLUTION) ×2 IMPLANT
PACK LAMINECTOMY NEURO (CUSTOM PROCEDURE TRAY) ×2 IMPLANT
SUT MNCRL AB 3-0 PS2 27 (SUTURE) ×2 IMPLANT
SUT SILK 2 0 PERMA HAND 18 BK (SUTURE) ×2 IMPLANT
SUT SILK 2 0 TIES 10X30 (SUTURE) ×2 IMPLANT
SUT VIC AB 2-0 CP2 18 (SUTURE) ×2 IMPLANT
SYR CONTROL 10ML LL (SYRINGE) ×4 IMPLANT
TOWEL GREEN STERILE (TOWEL DISPOSABLE) ×2 IMPLANT
TOWEL GREEN STERILE FF (TOWEL DISPOSABLE) ×2 IMPLANT
WATER STERILE IRR 1000ML POUR (IV SOLUTION) ×2 IMPLANT

## 2021-08-17 NOTE — Transfer of Care (Signed)
Immediate Anesthesia Transfer of Care Note  Patient: Randy [name]  Procedure(s) Performed: PLACEMENT OF LUMBAR DRAIN  Patient Location: PACU  Anesthesia Type:General  Level of Consciousness: drowsy  Airway & Oxygen Therapy: Patient Spontanous Breathing and Patient connected to face mask oxygen  Post-op Assessment: Report given to RN and Post -op Vital signs reviewed and stable  Post vital signs: Reviewed and stable  Last Vitals:  Vitals Value Taken Time  BP 177/99 08/17/21 1406  Temp    Pulse 69 08/17/21 1406  Resp 21 08/17/21 1406  SpO2 100 % 08/17/21 1406  Vitals shown include unvalidated device data.  Last Pain:  Vitals:   08/17/21 1043  TempSrc:   PainSc: 2       Patients Stated Pain Goal: 0 (65/68/12 7517)  Complications: No notable events documented.

## 2021-08-17 NOTE — Anesthesia Postprocedure Evaluation (Signed)
Anesthesia Post Note  Patient: Randy [name]  Procedure(s) Performed: [place]     Patient location during evaluation: PACU Anesthesia Type: General Level of consciousness: awake and alert and oriented Pain management: pain level controlled Vital Signs Assessment: post-procedure vital signs reviewed and stable Respiratory status: spontaneous breathing, nonlabored ventilation and respiratory function stable Cardiovascular status: blood pressure returned to baseline and stable Postop Assessment: no apparent nausea or vomiting Anesthetic complications: no   No notable events documented.  Last Vitals:  Vitals:   08/17/21 1420 08/17/21 1435  BP: (!) 174/84 (!) 154/65  Pulse: 62   Resp: 20 18  Temp:  (!) 36.3 C  SpO2: 100%     Last Pain:  Vitals:   08/17/21 1435  TempSrc:   PainSc: 0-No pain                 Lucian Baswell A.

## 2021-08-17 NOTE — Anesthesia Procedure Notes (Signed)
Procedure Name: Intubation Date/Time: 08/17/2021 1:15 PM Performed by: Vonna Drafts, CRNA Pre-anesthesia Checklist: Patient identified, Emergency Drugs available, Suction available and Patient being monitored Patient Re-evaluated:Patient Re-evaluated prior to induction Oxygen Delivery Method: Circle system utilized Preoxygenation: Pre-oxygenation with 100% oxygen Induction Type: IV induction Ventilation: Mask ventilation without difficulty Laryngoscope Size: Mac and 3 Grade View: Grade II Tube type: Oral Tube size: 7.5 mm Number of attempts: 1 Airway Equipment and Method: Stylet and Oral airway Placement Confirmation: ETT inserted through vocal cords under direct vision, positive ETCO2 and breath sounds checked- equal and bilateral Secured at: 22 cm Tube secured with: Tape Dental Injury: Teeth and Oropharynx as per pre-operative assessment

## 2021-08-17 NOTE — H&P (Signed)
Surgical H&P Update  HPI: 81 y.o. with a history of suspected NPH, here for lumbar drain placement / trial. No changes in health since they were last seen. Still having the above and wishes to proceed with surgery.  PMHx:  Past Medical History:  Diagnosis Date   Diabetes mellitus    Type II   Hypertension    OSA (obstructive sleep apnea) 11/24/2016   FamHx:  Family History  Problem Relation Age of Onset   Brain cancer Mother    Hypertension Father    Diabetes Father    Stroke Father    SocHx:  reports that he quit smoking about 20 years ago. His smoking use included cigarettes. He has a 30.00 pack-year smoking history. He quit smokeless tobacco use about 30 years ago.  His smokeless tobacco use included chew. He reports that he does not currently use alcohol after a past usage of about 18.0 standard drinks per week. He reports that he does not use drugs.  Physical Exam: Aox2, PERRL, gaze conjugate, FS, strength 5/5x4, SILTx4  Assesment/Plan: 81 y.o. man with suspected NPH, here for LD trial. Risks, benefits, and alternatives discussed and the patient would like to continue with surgery.  -OR today -4N ICU post-op for drain management  Judith Part, MD 08/17/21 12:50 PM

## 2021-08-17 NOTE — Progress Notes (Signed)
°  Transition of Care (TOC) Screening Note   Patient Details  Name: Randy [name] Date of Birth: 09/28/1940   Transition of Care [facility]) CM/SW Contact:    Benard Halsted, LCSW Phone Number: 08/17/2021, 3:07 PM    Transition of Care Department [facility]) has reviewed patient and no TOC needs have been identified at this time. We will continue to monitor patient advancement through interdisciplinary progression rounds. If new patient transition needs arise, please place a TOC consult.

## 2021-08-17 NOTE — Op Note (Signed)
PATIENT: Randy [name]  DAY OF SURGERY: 08/17/21   PRE-OPERATIVE DIAGNOSIS:  Normal pressure hydrocephalus   POST-OPERATIVE DIAGNOSIS:  Same   PROCEDURE:  Lumbar drain placement   SURGEON:  Surgeon(s) and Role:    Judith Part, MD - Primary   ANESTHESIA: ETGA   BRIEF HISTORY: This is an 82 year old man who presented with symptoms concerning for possible NPH with ventriculomegaly, no evidence of obstructive hydrocephalus. I therefore recommended a lumbar drain trial. This was discussed with the patient as well as risks, benefits, and alternatives and wished to proceed with surgery.   OPERATIVE DETAIL: The patient was taken to the operating room, anesthesia was induced by the anesthesia team, and the patient was placed on the OR table in the right lateral decubitus position. A formal time out was performed with two patient identifiers and confirmed the operative site. The operative site was marked in the midline of the lumbar spine at the intercristal plane. The area was then prepped and draped in a sterile fashion.   A Tuohy needle was then inserted using standard landmarks and advanced until CSF was obtained. CSF was clear and was not under high pressure. The catheter was inserted without resistance, needle was removed, and the catheter was secured with continued good flow of CSF after securing the catheter. It was secured with 3 sutures and a sterile dressing was placed, then connected to a drainage bag in a sterile fashion.   The patient was then returned to anesthesia for emergence. No apparent complications at the completion of the procedure.   EBL:  35mL   DRAINS: Lumbar drain   SPECIMENS: none   Judith Part, MD 08/17/21 1:52 PM

## 2021-08-17 NOTE — Anesthesia Preprocedure Evaluation (Addendum)
Anesthesia Evaluation  Patient identified by MRN, date of birth, ID band Patient awake    Reviewed: Allergy & Precautions, NPO status , Patient's Chart, lab work & pertinent test results, reviewed documented beta blocker date and time   Airway Mallampati: I  TM Distance: >3 FB Neck ROM: Full    Dental no notable dental hx. (+) Teeth Intact, Caps, Dental Advisory Given   Pulmonary sleep apnea , former smoker,    Pulmonary exam normal breath sounds clear to auscultation       Cardiovascular hypertension, Pt. on medications and Pt. on home beta blockers Normal cardiovascular exam Rhythm:Regular Rate:Normal     Neuro/Psych Normal pressure hydrocephalus Altered mental status a couple of weeks ago, now A&O x 3 negative psych ROS   GI/Hepatic negative GI ROS, Neg liver ROS,   Endo/Other  diabetes, Well Controlled, Type 2, Oral Hypoglycemic Agents  Renal/GU negative Renal ROS  negative genitourinary   Musculoskeletal negative musculoskeletal ROS (+)   Abdominal   Peds  Hematology  (+) Blood dyscrasia, anemia ,   Anesthesia Other Findings   Reproductive/Obstetrics                           Anesthesia Physical Anesthesia Plan  ASA: 3  Anesthesia Plan: General   Post-op Pain Management:    Induction: Intravenous  PONV Risk Score and Plan: 3 and Ondansetron and Treatment may vary due to age or medical condition  Airway Management Planned: Oral ETT  Additional Equipment: None  Intra-op Plan:   Post-operative Plan: Extubation in OR  Informed Consent: I have reviewed the patients History and Physical, chart, labs and discussed the procedure including the risks, benefits and alternatives for the proposed anesthesia with the patient or authorized representative who has indicated his/her understanding and acceptance.     Dental advisory given  Plan Discussed with: Anesthesiologist and  CRNA  Anesthesia Plan Comments:       Anesthesia Quick Evaluation

## 2021-08-18 ENCOUNTER — Encounter (HOSPITAL_COMMUNITY): Payer: Self-pay | Admitting: Neurological Surgery

## 2021-08-18 LAB — GLUCOSE, CAPILLARY
Glucose-Capillary: 101 mg/dL — ABNORMAL HIGH (ref 70–99)
Glucose-Capillary: 127 mg/dL — ABNORMAL HIGH (ref 70–99)
Glucose-Capillary: 211 mg/dL — ABNORMAL HIGH (ref 70–99)
Glucose-Capillary: 102 mg/dL — ABNORMAL HIGH (ref 70–99)

## 2021-08-18 MED ORDER — CHLORHEXIDINE GLUCONATE CLOTH 2 % EX PADS
6.0000 | MEDICATED_PAD | Freq: Every day | CUTANEOUS | Status: DC
  Administered 2021-08-18: 6 via TOPICAL

## 2021-08-18 MED ORDER — LIDOCAINE HCL (PF) 1 % IJ SOLN
INTRAMUSCULAR | Status: AC
  Filled 2021-08-18: qty 5

## 2021-08-18 NOTE — Discharge Instructions (Signed)
You may take a shower on discharge. You have a suture that will fall out on its own. Use care when washing the incision area.

## 2021-08-18 NOTE — Evaluation (Signed)
Occupational Therapy Evaluation and Discharge Patient Details Name: Randy [name] MRN: [id number] DOB: [birth year] Today's Date: 08/18/2021   History of Present Illness 81 y.o. male presents to [facility] hospital on 08/17/2021 with suspected NPH. Pt underwent lumbar drain placement on 08/17/2021. PMH includes DMII, HTN, OSA.   Clinical Impression   Pt has been working with [facility] therapies since last admission. He is typically independent in ADL and mobility, enjoys travel. Today Pt is overall supervision for line management during ADL and transfers. Able to demonstrate UB and LB ADL, standing balance, activity tolerance. Both Pt and Wife feel like he is at his baseline, and they have no concerns at this time but would like to continue Gross that they previously set up. OT to sign off acutely at this time.       Recommendations for follow up therapy are one component of a multi-disciplinary discharge planning process, led by the attending physician.  Recommendations may be updated based on patient status, additional functional criteria and insurance authorization.   Follow Up Recommendations  Other (comment) (continue HHOT from last admission)    Assistance Recommended at Discharge Intermittent Supervision/Assistance  Patient can return home with the following      Functional Status Assessment     Equipment Recommendations  None recommended by OT    Recommendations for Other Services       Precautions / Restrictions Precautions Precautions: Fall Precaution Comments: history of falls, lumbar drain Restrictions Weight Bearing Restrictions: No      Mobility Bed Mobility               General bed mobility comments: recieved in chair and in chair at end of session    Transfers Overall transfer level: Needs assistance Equipment used: None Transfers: Sit to/from Stand Sit to Stand: Supervision                  Balance Overall balance assessment: Needs  assistance Sitting-balance support: No upper extremity supported, Feet supported Sitting balance-Leahy Scale: Good     Standing balance support: No upper extremity supported, During functional activity Standing balance-Leahy Scale: Fair                             ADL either performed or assessed with clinical judgement   ADL Overall ADL's : At baseline                                       General ADL Comments: overall supervision level for drain/line safety and management. but able to demonstrate UB and LB ADL, standing balance for sink level grooming, fine motor, etc. Pt and wife both confident in his ability to perform ADL at independent level in home setting and no red flags from OT. Pt was working with [facility] and recommend continuing this service.     Vision Baseline Vision/History: 1 Wears glasses Ability to See in Adequate Light: 0 Adequate Patient Visual Report: No change from baseline Vision Assessment?: No apparent visual deficits     Perception     Praxis      Pertinent Vitals/Pain Pain Assessment Pain Assessment: Faces Faces Pain Scale: Hurts little more Pain Location: back Pain Descriptors / Indicators: Sore     Hand Dominance Right   Extremity/Trunk Assessment Upper Extremity Assessment Upper Extremity Assessment: Overall WFL for tasks assessed   Lower  Extremity Assessment Lower Extremity Assessment: Defer to PT evaluation   Cervical / Trunk Assessment Cervical / Trunk Assessment: Normal   Communication Communication Communication: No difficulties   Cognition Arousal/Alertness: Awake/alert Behavior During Therapy: WFL for tasks assessed/performed Overall Cognitive Status: Within Functional Limits for tasks assessed                                       General Comments  VSS on RA    Exercises     Shoulder Instructions      Home Living Family/patient expects to be discharged to:: Private  residence Living Arrangements: Spouse/significant other Available Help at Discharge: Family Type of Home: House Home Access: Stairs to enter Technical brewer of Steps: 2 Entrance Stairs-Rails: None Home Layout: One level     Bathroom Shower/Tub: Occupational psychologist: Handicapped height     Home Equipment: Conservation officer, nature (2 wheels) (walking stick)          Prior Functioning/Environment Prior Level of Function : Independent/Modified Independent             Mobility Comments: driving, mows the yard, walks without AD          OT Problem List: Decreased activity tolerance      OT Treatment/Interventions:      OT Goals(Current goals can be found in the care plan section) Acute Rehab OT Goals Patient Stated Goal: home today OT Goal Formulation: With patient/family Time For Goal Achievement: 09/01/21 Potential to Achieve Goals: Good  OT Frequency:      Co-evaluation              AM-PAC OT "6 Clicks" Daily Activity     Outcome Measure Help from another person eating meals?: None Help from another person taking care of personal grooming?: None Help from another person toileting, which includes using toliet, bedpan, or urinal?: A Little Help from another person bathing (including washing, rinsing, drying)?: None Help from another person to put on and taking off regular upper body clothing?: None Help from another person to put on and taking off regular lower body clothing?: None 6 Click Score: 23   End of Session Equipment Utilized During Treatment: Gait belt Nurse Communication: Mobility status  Activity Tolerance: Patient tolerated treatment well Patient left: in chair;with call bell/phone within reach;with family/visitor present;with chair alarm set  OT Visit Diagnosis: Other symptoms and signs involving the nervous system (R29.898)                Time: 3903-0092 OT Time Calculation (min): 14 min Charges:  OT General Charges $OT  Visit: 1 Visit OT Evaluation $OT Eval Low Complexity: Hermann OTR/L Acute Rehabilitation Services Pager: [phone] Office: [place] 08/18/2021, 12:23 PM

## 2021-08-18 NOTE — Evaluation (Signed)
Physical Therapy Evaluation Patient Details Name: Randy [name] MRN: [id number] DOB: 1940-11-06 Today's Date: 08/18/2021  History of Present Illness  81 y.o. male presents to [facility] hospital on 08/17/2021 with suspected NPH. Pt underwent lumbar drain placement on 08/17/2021. PMH includes DMII, HTN, OSA.  Clinical Impression  Pt presents to PT s/p lumbar drain placement with mild deficits in gait and balance. Pt is able to accept multiple dynamic balance challenges during session without noticeable gait deviation. Pt does prefer to utilize walking stick during this session due to reports of mild instability. Pt with a goal to return to golfing and will benefit from continued acute PT services to aide in returning to independence with dynamic balance tasks.       Recommendations for follow up therapy are one component of a multi-disciplinary discharge planning process, led by the attending physician.  Recommendations may be updated based on patient status, additional functional criteria and insurance authorization.  Follow Up Recommendations No PT follow up    Assistance Recommended at Discharge Intermittent Supervision/Assistance  Patient can return home with the following  A little help with walking and/or transfers    Equipment Recommendations None recommended by PT  Recommendations for Other Services       Functional Status Assessment Patient has had a recent decline in their functional status and demonstrates the ability to make significant improvements in function in a reasonable and predictable amount of time.     Precautions / Restrictions Precautions Precautions: Fall Precaution Comments: history of falls, lumbar drain Restrictions Weight Bearing Restrictions: No      Mobility  Bed Mobility Overal bed mobility: Modified Independent Bed Mobility: Supine to Sit     Supine to sit: Modified independent (Device/Increase time), HOB elevated          Transfers Overall  transfer level: Needs assistance Equipment used: None Transfers: Sit to/from Stand Sit to Stand: Supervision                Ambulation/Gait Ambulation/Gait assistance: Supervision Gait Distance (Feet): 800 Feet Assistive device: Straight cane (walking stick) Gait Pattern/deviations: Step-through pattern Gait velocity: functional Gait velocity interpretation: >2.62 ft/sec, indicative of community ambulatory   General Gait Details: pt with steady step-through gait, able to perform head turns, turn quickly, change gait speed, stop abruptly, and walk backward without loss of balance  Stairs            Wheelchair Mobility    Modified Rankin (Stroke Patients Only)       Balance Overall balance assessment: Needs assistance Sitting-balance support: No upper extremity supported, Feet supported Sitting balance-Leahy Scale: Good     Standing balance support: No upper extremity supported, During functional activity Standing balance-Leahy Scale: Fair                               Pertinent Vitals/Pain Pain Assessment Pain Assessment: Faces Faces Pain Scale: Hurts little more Pain Location: back Pain Descriptors / Indicators: Sore Pain Intervention(s): Monitored during session    Home Living Family/patient expects to be discharged to:: Private residence Living Arrangements: Spouse/significant other Available Help at Discharge: Family Type of Home: House Home Access: Stairs to enter Entrance Stairs-Rails: None Entrance Stairs-Number of Steps: 2   Home Layout: One level Home Equipment: Conservation officer, nature (2 wheels) (walking stick)      Prior Function Prior Level of Function : Independent/Modified Independent  Mobility Comments: driving, mows the yard, walks without AD       Hand Dominance   Dominant Hand: Right    Extremity/Trunk Assessment   Upper Extremity Assessment Upper Extremity Assessment: Overall WFL for tasks assessed     Lower Extremity Assessment Lower Extremity Assessment: Overall WFL for tasks assessed    Cervical / Trunk Assessment Cervical / Trunk Assessment: Normal  Communication   Communication: No difficulties  Cognition Arousal/Alertness: Awake/alert Behavior During Therapy: WFL for tasks assessed/performed Overall Cognitive Status: Within Functional Limits for tasks assessed                                          General Comments General comments (skin integrity, edema, etc.): VSS on RA    Exercises     Assessment/Plan    PT Assessment Patient needs continued PT services  PT Problem List Decreased balance;Decreased activity tolerance;Decreased mobility       PT Treatment Interventions Gait training;Stair training;Balance training;Neuromuscular re-education;Patient/family education    PT Goals (Current goals can be found in the Care Plan section)  Acute Rehab PT Goals Patient Stated Goal: to go home and get back to golfing PT Goal Formulation: With patient Time For Goal Achievement: 09/01/21 Potential to Achieve Goals: Fair Additional Goals Additional Goal #1: Pt will score >19/24 on the DGI to indicate a reduced risk for falls    Frequency Min 3X/week     Co-evaluation               AM-PAC PT "6 Clicks" Mobility  Outcome Measure Help needed turning from your back to your side while in a flat bed without using bedrails?: None Help needed moving from lying on your back to sitting on the side of a flat bed without using bedrails?: None Help needed moving to and from a bed to a chair (including a wheelchair)?: A Little Help needed standing up from a chair using your arms (e.g., wheelchair or bedside chair)?: A Little Help needed to walk in hospital room?: A Little Help needed climbing 3-5 steps with a railing? : A Little 6 Click Score: 20    End of Session   Activity Tolerance: Patient tolerated treatment well Patient left:  (left standing  with OT) Nurse Communication: Mobility status PT Visit Diagnosis: Other abnormalities of gait and mobility (R26.89);Difficulty in walking, not elsewhere classified (R26.2);Muscle weakness (generalized) (M62.81);History of falling (Z91.81)    Time: 4132-4401 PT Time Calculation (min) (ACUTE ONLY): 19 min   Charges:   PT Evaluation $PT Eval Low Complexity: [place], PT, DPT Acute Rehabilitation Pager: [phone] Office [phone]   Zenaida Niece 08/18/2021, 10:58 AM

## 2021-08-18 NOTE — Discharge Summary (Signed)
Discharge Summary  Date of Admission: 08/17/2021  Date of Discharge: 08/18/21  Attending Physician: Emelda Brothers, MD  Hospital Course: Patient was admitted following placement of a lumbar drain. They were recovered in PACU and transferred to 4N ICU. He had a great improvement in function, especially gait, while the lumbar drain was in place. This was considered a positive trial, so the LD was d/c'd and he was discharged home. They will follow up in clinic with me in clinic in 2 weeks.  Neurologic exam at discharge: Aox3, PERRL, EOMI, FS & SS, strength 5/5x4, SILTx4  Discharge diagnosis: Normal pressure hydrocephalus  Judith Part, MD 08/18/21 6:57 PM

## 2021-08-18 NOTE — Progress Notes (Signed)
Neurosurgery Service Progress Note  Subjective: No acute events overnight, no headaches, no new complaints, drain working well   Objective: Vitals:   08/18/21 0400 08/18/21 0500 08/18/21 0530 08/18/21 0600  BP: (!) 106/40 (!) 86/30 137/60 (!) 133/55  Pulse: 64 (!) 51 70 60  Resp: 19 17 18 18   Temp: 98.4 F (36.9 C)     TempSrc: Oral     SpO2: 97% 97% 95% 99%  Weight:      Height:        Physical Exam: Aox3, PERRL, EOMI, FS & SS, strength 5/5x4, LD in place w/ clear drainage  Assessment & Plan: 81 y.o. man s/p LD placement for NPH trial, recovering well.  -work w/ PT OT today -if he feels a significant difference w/ PT, can d/c the drain in the afternoon and discharge, otherwise will give him another 24h of drainage  Judith Part  08/18/21 8:40 AM

## 2021-08-18 NOTE — Progress Notes (Signed)
Pt did great w/ PT, definitely a positive trial, d/c'd LD, will discharge home today.

## 2021-08-20 ENCOUNTER — Encounter: Payer: Self-pay | Admitting: Adult Health

## 2021-08-27 ENCOUNTER — Other Ambulatory Visit: Payer: Self-pay | Admitting: Adult Health

## 2021-08-27 MED ORDER — CARVEDILOL 6.25 MG PO TABS: 6.2500 mg | ORAL_TABLET | Freq: Two times a day (BID) | ORAL | 1 refills | Status: AC

## 2021-08-27 NOTE — Telephone Encounter (Signed)
Please advise 

## 2021-09-17 ENCOUNTER — Other Ambulatory Visit: Payer: Self-pay | Admitting: Adult Health

## 2021-09-25 ENCOUNTER — Other Ambulatory Visit: Payer: Self-pay | Admitting: Adult Health

## 2021-09-27 ENCOUNTER — Encounter: Payer: Self-pay | Admitting: Dermatology

## 2021-09-27 ENCOUNTER — Ambulatory Visit (INDEPENDENT_AMBULATORY_CARE_PROVIDER_SITE_OTHER): Payer: Medicare PPO | Admitting: Dermatology

## 2021-09-27 ENCOUNTER — Other Ambulatory Visit: Payer: Self-pay

## 2021-09-27 DIAGNOSIS — L821 Other seborrheic keratosis: Secondary | ICD-10-CM | POA: Diagnosis not present

## 2021-09-27 DIAGNOSIS — L739 Follicular disorder, unspecified: Secondary | ICD-10-CM | POA: Diagnosis not present

## 2021-09-27 DIAGNOSIS — D1801 Hemangioma of skin and subcutaneous tissue: Secondary | ICD-10-CM

## 2021-09-27 DIAGNOSIS — L730 Acne keloid: Secondary | ICD-10-CM

## 2021-09-27 DIAGNOSIS — Z1283 Encounter for screening for malignant neoplasm of skin: Secondary | ICD-10-CM

## 2021-09-27 MED ORDER — CLOBETASOL PROPIONATE 0.05 % EX FOAM: Freq: Two times a day (BID) | CUTANEOUS | 0 refills | Status: AC

## 2021-09-27 NOTE — Patient Instructions (Addendum)
Scalpicin lotion (otc).  Please call in a month or mychart to update Dr. Denna Haggard.  ?

## 2021-10-17 ENCOUNTER — Encounter: Payer: Self-pay | Admitting: Dermatology

## 2021-10-17 NOTE — Progress Notes (Signed)
? ?  New Patient ?  ?Subjective  ?Randy [name] is a 81 y.o. male who presents for the following: New Patient (Initial Visit) (Patient here today with is wife for skin check. Per patient his last dermatology appointment was in the 73's. Per patient he has a few lesions on his posterior scalp x 1 year only bleeding when scratching. Check lesions on right temple x years per patient's wife these lesions have grown. Per patient's wife check lesion on his right eyelid x less than a year that growing no bleeding, no pain. No personal history or family history of atypical moles, melanoma or non mole skin cancer. ). ? ?General skin examination, several areas of concern ?Location:  ?Duration:  ?Quality:  ?Associated Signs/Symptoms: ?Modifying Factors:  ?Severity:  ?Timing: ?Context:  ? ? ?The following portions of the chart were reviewed this encounter and updated as appropriate:  Tobacco  Allergies  Meds  Problems  Med Hx  Surg Hx  Fam Hx   ?  ? ?Objective  ?Well appearing patient in no apparent distress; mood and affect are within normal limits. ?Waist up exam no atypical pigmented lesions (all checked with dermoscopy), no nonmelanoma skin cancer ? ?Left Breast, Left Forearm - Posterior, Mid Back, Right Malar Cheek ?Brown 4 to 8 mm textured papules, typical dermoscopy ? ?Mid Back ?Multiple 1 to 2 mm red smooth dermal papules ? ?Neck - Posterior ?Folliculitis with a component of acne keloidalis.  Patient told this is a frustrating disorder to have for to treat but he can try topical therapy for 10 weeks. ? ? ? ?All skin waist up examined. ? ? ?Assessment & Plan  ?Encounter for screening for malignant neoplasm of skin ? ?Annual skin examination. ? ?Seborrheic keratosis (4) ?Left Forearm - Posterior; Left Breast; Mid Back; Right Malar Cheek ? ?Leave if stable ? ?Cherry angioma ?Mid Back ? ?No intervention necessary ? ?Folliculitis ?Neck - Posterior ? ?Topical clobetasol foam daily for up to 10 weeks.  Should this fail,  as he has the option of returning for intralesional triamcinolone.  He may also try over-the-counter Scalpicin lotion for his general scalp itching. ? ?clobetasol (OLUX) 0.05 % topical foam - Neck - Posterior ?Apply topically 2 (two) times daily. ? ? ?

## 2021-10-19 ENCOUNTER — Ambulatory Visit: Payer: Medicare PPO | Admitting: Podiatry

## 2021-10-20 ENCOUNTER — Ambulatory Visit: Payer: Medicare PPO | Admitting: Podiatry

## 2021-11-22 ENCOUNTER — Other Ambulatory Visit: Payer: Self-pay | Admitting: Adult Health

## 2021-11-22 DIAGNOSIS — F339 Major depressive disorder, recurrent, unspecified: Secondary | ICD-10-CM

## 2021-11-24 ENCOUNTER — Other Ambulatory Visit: Payer: Self-pay | Admitting: *Deleted

## 2021-11-24 MED ORDER — FOLIC ACID 1 MG PO TABS: 1.0000 mg | ORAL_TABLET | Freq: Every day | ORAL | 0 refills | Status: DC

## 2021-11-24 NOTE — Telephone Encounter (Signed)
Patient requesting a refill of  Folic acid 1 mg Last filled by [facility], MD Okay to fill?

## 2021-12-03 ENCOUNTER — Other Ambulatory Visit: Payer: Self-pay | Admitting: Neurological Surgery

## 2021-12-10 NOTE — Pre-Procedure Instructions (Addendum)
Surgical Instructions    Your procedure is scheduled on Thursday, June 15th, 2023.  Report to [facility] Main Entrance "A" at 7:30 A.M., then check in with the Admitting office.  Call this number if you have problems the morning of surgery:  [phone]   If you have any questions prior to your surgery date call [phone]: Open Monday-Friday 8am-4pm    Remember:  Do not eat after midnight the night before your surgery  You may drink clear liquids until 6:30 AM the morning of your surgery.   Clear liquids allowed are: Water, Non-Citrus Juices (without pulp), Carbonated Beverages, Clear Tea, Black Coffee ONLY (NO MILK, CREAM OR POWDERED CREAMER of any kind), and Gatorade    Take these medicines the morning of surgery with A SIP OF WATER:   citalopram (CELEXA)    Take these medications the morning of surgery AS NEEDED:  carvedilol (COREG)   As of today, STOP taking any Aspirin (unless otherwise instructed by your surgeon) Aleve, Naproxen, Ibuprofen, Motrin, Advil, Goody's, BC's, all herbal medications, fish oil, and all vitamins.   WHAT DO I DO ABOUT MY DIABETES MEDICATION?   Do not take metFORMIN (GLUCOPHAGE) the morning of surgery   HOW TO MANAGE YOUR DIABETES BEFORE AND AFTER SURGERY  Why is it important to control my blood sugar before and after surgery? Improving blood sugar levels before and after surgery helps healing and can limit problems. A way of improving blood sugar control is eating a healthy diet by:  Eating less sugar and carbohydrates  Increasing activity/exercise  Talking with your doctor about reaching your blood sugar goals High blood sugars (greater than 180 mg/dL) can raise your risk of infections and slow your recovery, so you will need to focus on controlling your diabetes during the weeks before surgery. Make sure that the doctor who takes care of your diabetes knows about your planned surgery including the date and location.  How do I manage my  blood sugar before surgery? Check your blood sugar at least 4 times a day, starting 2 days before surgery, to make sure that the level is not too high or low.  Check your blood sugar the morning of your surgery when you wake up and every 2 hours until you get to the Short Stay unit.  If your blood sugar is less than 70 mg/dL, you will need to treat for low blood sugar: Do not take insulin. Treat a low blood sugar (less than 70 mg/dL) with  cup of clear juice (cranberry or apple), 4 glucose tablets, OR glucose gel. Recheck blood sugar in 15 minutes after treatment (to make sure it is greater than 70 mg/dL). If your blood sugar is not greater than 70 mg/dL on recheck, call [phone] for further instructions. Report your blood sugar to the short stay nurse when you get to Short Stay.  If you are admitted to the hospital after surgery: Your blood sugar will be checked by the staff and you will probably be given insulin after surgery (instead of oral diabetes medicines) to make sure you have good blood sugar levels. The goal for blood sugar control after surgery is 80-180 mg/dL.    [facility] is not responsible for any belongings or valuables. .   Do NOT Smoke (Tobacco/Vaping)  24 hours prior to your procedure  If you use a CPAP at night, you may bring your mask for your overnight stay.   Contacts, glasses, hearing aids, dentures or partials may not be  worn into surgery, please bring cases for these belongings   For patients admitted to the hospital, discharge time will be determined by your treatment team.   Patients discharged the day of surgery will not be allowed to drive home, and someone needs to stay with them for 24 hours.   SURGICAL WAITING ROOM VISITATION Patients having surgery or a procedure in a hospital may have two support people. Children under the age of 70 must have an adult with them who is not the patient. They may stay in the waiting area during the procedure and  may switch out with other visitors. If the patient needs to stay at the hospital during part of their recovery, the visitor guidelines for inpatient rooms apply.  Please refer to the [facility] website for the visitor guidelines for Inpatients (after your surgery is over and you are in a regular room).      Special instructions:    Oral Hygiene is also important to reduce your risk of infection.  Remember - BRUSH YOUR TEETH THE MORNING OF SURGERY WITH YOUR REGULAR TOOTHPASTE   [place]- Preparing For Surgery  Before surgery, you can play an important role. Because skin is not sterile, your skin needs to be as free of germs as possible. You can reduce the number of germs on your skin by washing with CHG (chlorahexidine gluconate) Soap before surgery.  CHG is an antiseptic cleaner which kills germs and bonds with the skin to continue killing germs even after washing.     Please do not use if you have an allergy to CHG or antibacterial soaps. If your skin becomes reddened/irritated stop using the CHG.  Do not shave (including legs and underarms) for at least 48 hours prior to first CHG shower. It is OK to shave your face.  Please follow these instructions carefully.     Shower the NIGHT BEFORE SURGERY and the MORNING OF SURGERY with CHG Soap.   If you chose to wash your hair, wash your hair first as usual with your normal shampoo. After you shampoo, rinse your hair and body thoroughly to remove the shampoo.  Then [employer] and genitals (private parts) with your normal soap and rinse thoroughly to remove soap.  After that Use CHG Soap as you would any other liquid soap. You can apply CHG directly to the skin and wash gently with a scrungie or a clean washcloth.   Apply the CHG Soap to your body ONLY FROM THE NECK DOWN.  Do not use on open wounds or open sores. Avoid contact with your eyes, ears, mouth and genitals (private parts). Wash Face and genitals (private parts)  with your normal  soap.   Wash thoroughly, paying special attention to the area where your surgery will be performed.  Thoroughly rinse your body with warm water from the neck down.  DO NOT shower/wash with your normal soap after using and rinsing off the CHG Soap.  Pat yourself dry with a CLEAN TOWEL.  Wear CLEAN PAJAMAS to bed the night before surgery  Place CLEAN SHEETS on your bed the night before your surgery  DO NOT SLEEP WITH PETS.   Day of Surgery:  Take a shower with CHG soap. Wear Clean/Comfortable clothing the morning of surgery Do not apply any deodorants/lotions.   Remember to brush your teeth WITH YOUR REGULAR TOOTHPASTE.  Do not wear jewelry or makeup Do not wear lotions, powders, perfumes/colognes, or deodorant. Men may shave face and neck. Do not  bring valuables to the hospital   If you received a COVID test during your pre-op visit, it is requested that you wear a mask when out in public, stay away from anyone that may not be feeling well, and notify your surgeon if you develop symptoms. If you have been in contact with anyone that has tested positive in the last 10 days, please notify your surgeon.    Please read over the following fact sheets that you were given.

## 2021-12-13 ENCOUNTER — Encounter (HOSPITAL_COMMUNITY)
Admission: RE | Admit: 2021-12-13 | Discharge: 2021-12-13 | Disposition: A | Discharge status: Home / Self Care | Payer: Medicare PPO | Source: Ambulatory Visit | Attending: Neurological Surgery | Admitting: Neurological Surgery

## 2021-12-13 ENCOUNTER — Encounter (HOSPITAL_COMMUNITY): Payer: Self-pay

## 2021-12-13 ENCOUNTER — Other Ambulatory Visit: Payer: Self-pay

## 2021-12-13 VITALS — BP 135/57 | HR 73 | Temp 97.9°F | Resp 17 | Ht 68.0 in | Wt 175.9 lb

## 2021-12-13 DIAGNOSIS — I10 Essential (primary) hypertension: Secondary | ICD-10-CM | POA: Diagnosis present

## 2021-12-13 DIAGNOSIS — Z01818 Encounter for other preprocedural examination: Secondary | ICD-10-CM

## 2021-12-13 DIAGNOSIS — E119 Type 2 diabetes mellitus without complications: Secondary | ICD-10-CM | POA: Diagnosis present

## 2021-12-13 DIAGNOSIS — E1169 Type 2 diabetes mellitus with other specified complication: Secondary | ICD-10-CM | POA: Insufficient documentation

## 2021-12-13 DIAGNOSIS — Z982 Presence of cerebrospinal fluid drainage device: Secondary | ICD-10-CM | POA: Diagnosis not present

## 2021-12-13 DIAGNOSIS — G4733 Obstructive sleep apnea (adult) (pediatric): Secondary | ICD-10-CM | POA: Diagnosis present

## 2021-12-13 DIAGNOSIS — Z01812 Encounter for preprocedural laboratory examination: Secondary | ICD-10-CM | POA: Insufficient documentation

## 2021-12-13 DIAGNOSIS — G912 (Idiopathic) normal pressure hydrocephalus: Secondary | ICD-10-CM | POA: Diagnosis present

## 2021-12-13 DIAGNOSIS — Z87891 Personal history of nicotine dependence: Secondary | ICD-10-CM | POA: Diagnosis not present

## 2021-12-13 DIAGNOSIS — Z7984 Long term (current) use of oral hypoglycemic drugs: Secondary | ICD-10-CM | POA: Diagnosis not present

## 2021-12-13 HISTORY — DX: Depression, unspecified: F32.A

## 2021-12-13 LAB — COMPREHENSIVE METABOLIC PANEL
ALT: 17 U/L (ref 0–44)
AST: 17 U/L (ref 15–41)
Albumin: 3.7 g/dL (ref 3.5–5.0)
Alkaline Phosphatase: 56 U/L (ref 38–126)
Anion gap: 4 — ABNORMAL LOW (ref 5–15)
BUN: 21 mg/dL (ref 8–23)
CO2: 35 mmol/L — ABNORMAL HIGH (ref 22–32)
Calcium: 11.2 mg/dL — ABNORMAL HIGH (ref 8.9–10.3)
Chloride: 100 mmol/L (ref 98–111)
Creatinine, Ser: 1.29 mg/dL — ABNORMAL HIGH (ref 0.61–1.24)
GFR, Estimated: 56 mL/min — ABNORMAL LOW (ref >60)
Glucose, Bld: 181 mg/dL — ABNORMAL HIGH (ref 70–99)
Potassium: 4.3 mmol/L (ref 3.5–5.1)
Sodium: 139 mmol/L (ref 135–145)
Total Bilirubin: 0.6 mg/dL (ref 0.3–1.2)
Total Protein: 6 g/dL — ABNORMAL LOW (ref 6.5–8.1)

## 2021-12-13 LAB — CBC
HCT: 40.8 % (ref 39.0–52.0)
Hemoglobin: 12.8 g/dL — ABNORMAL LOW (ref 13.0–17.0)
MCH: 32.3 pg (ref 26.0–34.0)
MCHC: 31.4 g/dL (ref 30.0–36.0)
MCV: 103 fL — ABNORMAL HIGH (ref 80.0–100.0)
Platelets: 203 10*3/uL (ref 150–400)
RBC: 3.96 MIL/uL — ABNORMAL LOW (ref 4.22–5.81)
RDW: 12.8 % (ref 11.5–15.5)
WBC: 6.8 10*3/uL (ref 4.0–10.5)
nRBC: 0 % (ref 0.0–0.2)

## 2021-12-13 LAB — GLUCOSE, CAPILLARY: Glucose-Capillary: 218 mg/dL — ABNORMAL HIGH (ref 70–99)

## 2021-12-13 LAB — SURGICAL PCR SCREEN
MRSA, PCR: NEGATIVE
Staphylococcus aureus: NEGATIVE

## 2021-12-13 LAB — HEMOGLOBIN A1C
Hgb A1c MFr Bld: 5.8 % — ABNORMAL HIGH (ref 4.8–5.6)
Mean Plasma Glucose: 119.76 mg/dL

## 2021-12-13 NOTE — Progress Notes (Signed)
PCP - Dorothyann Peng, NP Cardiologist - States he has seen one in the past but can't recall when  PPM/ICD - denies Device Orders - n/a Rep Notified - n/a  Chest x-ray - 07/29/2021 EKG - 07/30/2021 Stress Test - 03/06/2020 ECHO - denies Cardiac Cath - denies  Sleep Study - 03/28/2018 CPAP - Wears every night. Does not know pressure settings  Fasting Blood Sugar - CBG 218 at PAT visit. Pt ate Everything Bagel with cream cheese this morning. He already took his Metformin today. States he checks CBG once a week and typically range 110-135. Checks Blood Sugar once per week.  Blood Thinner Instructions: n/a Aspirin Instructions:n/a  ERAS Protcol - Yes. Pt may have clear liquids until 0630 the morning or surgery. PRE-SURGERY Ensure or G2- n/a. None ordered.  COVID TEST- n/a   Anesthesia review: Yes. Recent ED admission in February 2023 for AMS. [place] saw patient during admission. DC paperwork states to follow-up with Dr. Zada Finders two weeks after discharge, patient states he did but no note seen in system.   Patient denies shortness of breath, fever, cough and chest pain at PAT appointment   All instructions explained to the patient, with a verbal understanding of the material. Patient agrees to go over the instructions while at home for a better understanding. Patient also instructed to self quarantine after being tested for COVID-19. The opportunity to ask questions was provided.

## 2021-12-16 ENCOUNTER — Inpatient Hospital Stay (HOSPITAL_COMMUNITY): Payer: Medicare PPO

## 2021-12-16 ENCOUNTER — Other Ambulatory Visit: Payer: Self-pay

## 2021-12-16 ENCOUNTER — Encounter (HOSPITAL_COMMUNITY): Payer: Self-pay | Admitting: Neurological Surgery

## 2021-12-16 ENCOUNTER — Encounter (HOSPITAL_COMMUNITY)
Admission: RE | Disposition: A | Discharge status: Home / Self Care | Payer: Self-pay | Source: Home / Self Care | Attending: Neurological Surgery

## 2021-12-16 ENCOUNTER — Inpatient Hospital Stay (HOSPITAL_COMMUNITY)
Admission: RE | Admit: 2021-12-16 | Discharge: 2021-12-17 | DRG: 030 | Disposition: A | Discharge status: Home / Self Care | Payer: Medicare PPO | Attending: Neurological Surgery | Admitting: Neurological Surgery

## 2021-12-16 DIAGNOSIS — E119 Type 2 diabetes mellitus without complications: Secondary | ICD-10-CM | POA: Diagnosis present

## 2021-12-16 DIAGNOSIS — G912 (Idiopathic) normal pressure hydrocephalus: Secondary | ICD-10-CM | POA: Diagnosis present

## 2021-12-16 DIAGNOSIS — Z87891 Personal history of nicotine dependence: Secondary | ICD-10-CM

## 2021-12-16 DIAGNOSIS — Z7984 Long term (current) use of oral hypoglycemic drugs: Secondary | ICD-10-CM | POA: Diagnosis not present

## 2021-12-16 DIAGNOSIS — I10 Essential (primary) hypertension: Secondary | ICD-10-CM | POA: Diagnosis present

## 2021-12-16 DIAGNOSIS — G4733 Obstructive sleep apnea (adult) (pediatric): Secondary | ICD-10-CM | POA: Diagnosis present

## 2021-12-16 DIAGNOSIS — Z982 Presence of cerebrospinal fluid drainage device: Secondary | ICD-10-CM | POA: Diagnosis not present

## 2021-12-16 HISTORY — PX: VENTRICULOPERITONEAL SHUNT: SHX204

## 2021-12-16 LAB — GLUCOSE, CAPILLARY
Glucose-Capillary: 126 mg/dL — ABNORMAL HIGH (ref 70–99)
Glucose-Capillary: 154 mg/dL — ABNORMAL HIGH (ref 70–99)
Glucose-Capillary: 165 mg/dL — ABNORMAL HIGH (ref 70–99)
Glucose-Capillary: 217 mg/dL — ABNORMAL HIGH (ref 70–99)
Glucose-Capillary: 173 mg/dL — ABNORMAL HIGH (ref 70–99)

## 2021-12-16 IMAGING — RF DG LUMBAR SPINE 1V
1 series · 1 of 1 positions shown · non-contrast
Comparison: None Available.

CLINICAL DATA: Fluoroscopic assistance for placement of
lumboperitoneal shunt

EXAM:
LUMBAR SPINE - 1 VIEW

[Series 1: run · 1 of 1 slices shown]
[im 1/1]
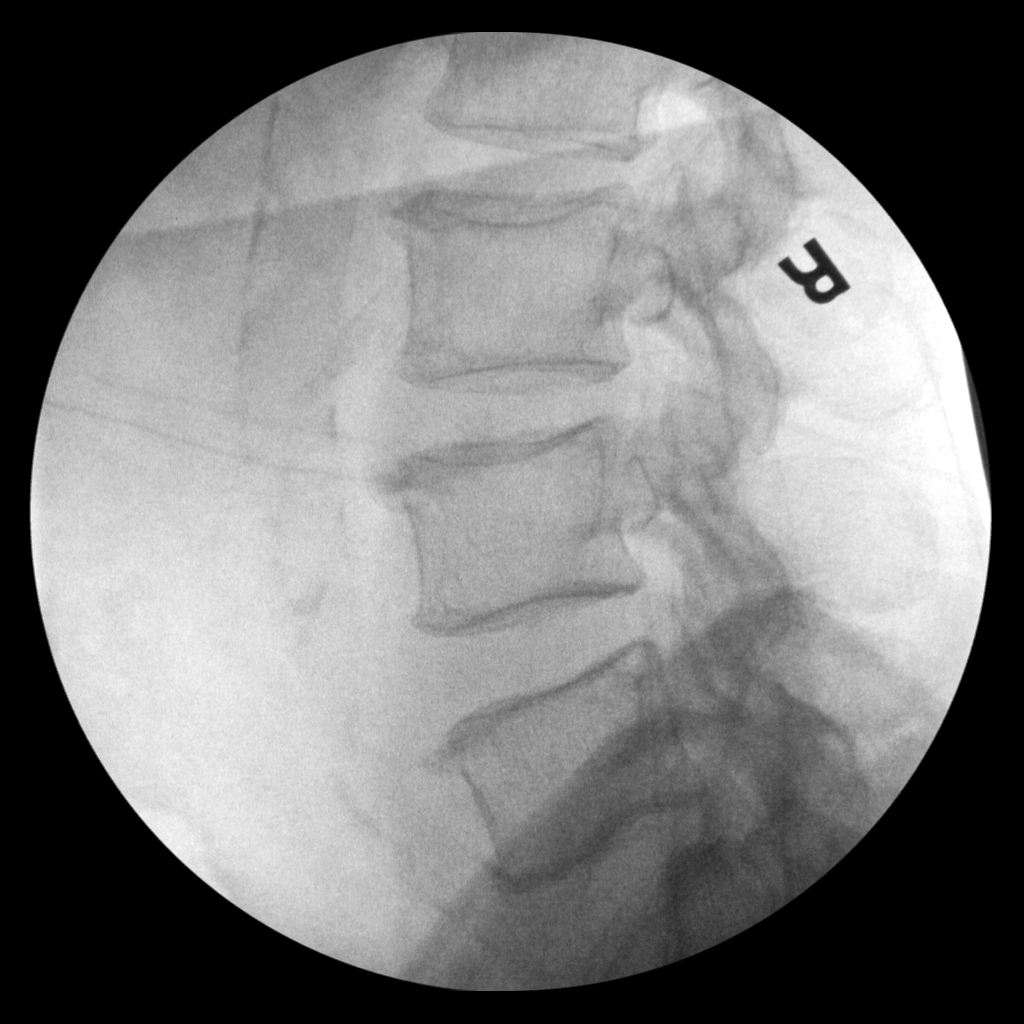

[1 of 1 positions shown; findings below may reference images not displayed]

FINDINGS: Fluoroscopic assistance was provided for lumbar spine procedure.
Fluoroscopic time was 37 seconds. Radiation dose was 22.16 mGy.
IMPRESSION: Fluoroscopic assistance was provided for lumbar spine procedure.

## 2021-12-16 SURGERY — SHUNT INSERTION VENTRICULAR-PERITONEAL
Anesthesia: General | Site: Flank | Laterality: Right

## 2021-12-16 MED ORDER — EPHEDRINE SULFATE-NACL 50-0.9 MG/10ML-% IV SOSY
PREFILLED_SYRINGE | INTRAVENOUS | Status: DC | PRN
  Administered 2021-12-16: 10 mg via INTRAVENOUS

## 2021-12-16 MED ORDER — VITAMIN B-12 1000 MCG PO TABS
1000.0000 ug | ORAL_TABLET | ORAL | Status: DC
  Administered 2021-12-16: 1000 ug via ORAL
  Filled 2021-12-16: qty 1

## 2021-12-16 MED ORDER — CEFAZOLIN SODIUM-DEXTROSE 2-4 GM/100ML-% IV SOLN
2.0000 g | Freq: Three times a day (TID) | INTRAVENOUS | Status: AC
  Administered 2021-12-16 – 2021-12-17 (×2): 2 g via INTRAVENOUS
  Filled 2021-12-16 (×2): qty 100

## 2021-12-16 MED ORDER — THROMBIN 5000 UNITS EX SOLR
CUTANEOUS | Status: AC
Start: 2021-12-16 — End: ?
  Filled 2021-12-16: qty 5000

## 2021-12-16 MED ORDER — ONDANSETRON HCL 4 MG PO TABS: 4.0000 mg | ORAL_TABLET | Freq: Four times a day (QID) | ORAL | Status: DC | PRN

## 2021-12-16 MED ORDER — MENTHOL 3 MG MT LOZG: 1.0000 | LOZENGE | OROMUCOSAL | Status: DC | PRN

## 2021-12-16 MED ORDER — LIDOCAINE-EPINEPHRINE 1 %-1:100000 IJ SOLN
INTRAMUSCULAR | Status: AC
Start: 2021-12-16 — End: ?
  Filled 2021-12-16: qty 1

## 2021-12-16 MED ORDER — ROCURONIUM BROMIDE 10 MG/ML (PF) SYRINGE
PREFILLED_SYRINGE | INTRAVENOUS | Status: DC | PRN
  Administered 2021-12-16: 60 mg via INTRAVENOUS

## 2021-12-16 MED ORDER — LACTATED RINGERS IV SOLN: INTRAVENOUS | Status: DC

## 2021-12-16 MED ORDER — HYDROCHLOROTHIAZIDE 12.5 MG PO TABS
12.5000 mg | ORAL_TABLET | Freq: Every day | ORAL | Status: DC
  Administered 2021-12-16 – 2021-12-17 (×2): 12.5 mg via ORAL
  Filled 2021-12-16 (×2): qty 1

## 2021-12-16 MED ORDER — CITALOPRAM HYDROBROMIDE 20 MG PO TABS
10.0000 mg | ORAL_TABLET | Freq: Every morning | ORAL | Status: DC
  Administered 2021-12-17: 10 mg via ORAL
  Filled 2021-12-16: qty 1

## 2021-12-16 MED ORDER — INSULIN ASPART 100 UNIT/ML IJ SOLN: 0.0000 [IU] | INTRAMUSCULAR | Status: DC | PRN

## 2021-12-16 MED ORDER — ONDANSETRON HCL 4 MG/2ML IJ SOLN: 4.0000 mg | Freq: Four times a day (QID) | INTRAMUSCULAR | Status: DC | PRN

## 2021-12-16 MED ORDER — PHENOL 1.4 % MT LIQD: 1.0000 | OROMUCOSAL | Status: DC | PRN

## 2021-12-16 MED ORDER — THIAMINE HCL 100 MG PO TABS
100.0000 mg | ORAL_TABLET | Freq: Every day | ORAL | Status: DC
  Administered 2021-12-16 – 2021-12-17 (×2): 100 mg via ORAL
  Filled 2021-12-16 (×2): qty 1

## 2021-12-16 MED ORDER — DEXAMETHASONE SODIUM PHOSPHATE 10 MG/ML IJ SOLN
INTRAMUSCULAR | Status: DC | PRN
  Administered 2021-12-16: 5 mg via INTRAVENOUS

## 2021-12-16 MED ORDER — SUGAMMADEX SODIUM 200 MG/2ML IV SOLN
INTRAVENOUS | Status: DC | PRN
  Administered 2021-12-16: 200 mg via INTRAVENOUS

## 2021-12-16 MED ORDER — BACITRACIN ZINC 500 UNIT/GM EX OINT
TOPICAL_OINTMENT | CUTANEOUS | Status: AC
Start: 2021-12-16 — End: ?
  Filled 2021-12-16: qty 28.35

## 2021-12-16 MED ORDER — LISINOPRIL-HYDROCHLOROTHIAZIDE 20-12.5 MG PO TABS: 1.0000 | ORAL_TABLET | Freq: Every day | ORAL | Status: DC

## 2021-12-16 MED ORDER — METFORMIN HCL 500 MG PO TABS
1000.0000 mg | ORAL_TABLET | Freq: Every day | ORAL | Status: DC
  Administered 2021-12-17: 1000 mg via ORAL
  Filled 2021-12-16: qty 2

## 2021-12-16 MED ORDER — FENTANYL CITRATE (PF) 250 MCG/5ML IJ SOLN
INTRAMUSCULAR | Status: DC | PRN
  Administered 2021-12-16: 100 ug via INTRAVENOUS
  Administered 2021-12-16: 25 ug via INTRAVENOUS

## 2021-12-16 MED ORDER — PHENYLEPHRINE HCL-NACL 20-0.9 MG/250ML-% IV SOLN
INTRAVENOUS | Status: DC | PRN
  Administered 2021-12-16: 30 ug/min via INTRAVENOUS

## 2021-12-16 MED ORDER — CYCLOBENZAPRINE HCL 10 MG PO TABS: 10.0000 mg | ORAL_TABLET | Freq: Three times a day (TID) | ORAL | Status: DC | PRN

## 2021-12-16 MED ORDER — SODIUM CHLORIDE 0.9 % IV SOLN
250.0000 mL | INTRAVENOUS | Status: DC
  Administered 2021-12-16: 250 mL via INTRAVENOUS

## 2021-12-16 MED ORDER — PHENYLEPHRINE 80 MCG/ML (10ML) SYRINGE FOR IV PUSH (FOR BLOOD PRESSURE SUPPORT)
PREFILLED_SYRINGE | INTRAVENOUS | Status: DC | PRN
  Administered 2021-12-16: 80 ug via INTRAVENOUS
  Administered 2021-12-16: 160 ug via INTRAVENOUS

## 2021-12-16 MED ORDER — CHLORHEXIDINE GLUCONATE CLOTH 2 % EX PADS: 6.0000 | MEDICATED_PAD | Freq: Once | CUTANEOUS | Status: DC

## 2021-12-16 MED ORDER — SODIUM CHLORIDE 0.9% FLUSH: 3.0000 mL | INTRAVENOUS | Status: DC | PRN

## 2021-12-16 MED ORDER — FENTANYL CITRATE (PF) 100 MCG/2ML IJ SOLN
25.0000 ug | INTRAMUSCULAR | Status: DC | PRN
  Administered 2021-12-16: 50 ug via INTRAVENOUS

## 2021-12-16 MED ORDER — FOLIC ACID 1 MG PO TABS
1.0000 mg | ORAL_TABLET | Freq: Every day | ORAL | Status: DC
  Administered 2021-12-16 – 2021-12-17 (×2): 1 mg via ORAL
  Filled 2021-12-16 (×2): qty 1

## 2021-12-16 MED ORDER — CARVEDILOL 6.25 MG PO TABS: 6.2500 mg | ORAL_TABLET | Freq: Two times a day (BID) | ORAL | Status: DC | PRN

## 2021-12-16 MED ORDER — FENTANYL CITRATE (PF) 100 MCG/2ML IJ SOLN
INTRAMUSCULAR | Status: AC
  Filled 2021-12-16: qty 2

## 2021-12-16 MED ORDER — PROPOFOL 10 MG/ML IV BOLUS
INTRAVENOUS | Status: AC
  Filled 2021-12-16: qty 20

## 2021-12-16 MED ORDER — DOCUSATE SODIUM 100 MG PO CAPS
100.0000 mg | ORAL_CAPSULE | Freq: Two times a day (BID) | ORAL | Status: DC
  Administered 2021-12-16 – 2021-12-17 (×3): 100 mg via ORAL
  Filled 2021-12-16 (×3): qty 1

## 2021-12-16 MED ORDER — FENTANYL CITRATE (PF) 250 MCG/5ML IJ SOLN
INTRAMUSCULAR | Status: AC
  Filled 2021-12-16: qty 5

## 2021-12-16 MED ORDER — POLYETHYLENE GLYCOL 3350 17 G PO PACK: 17.0000 g | PACK | Freq: Every day | ORAL | Status: DC | PRN

## 2021-12-16 MED ORDER — HYDROCODONE-ACETAMINOPHEN 5-325 MG PO TABS
1.0000 | ORAL_TABLET | ORAL | Status: DC | PRN
  Administered 2021-12-17 (×2): 1 via ORAL
  Filled 2021-12-16 (×2): qty 1

## 2021-12-16 MED ORDER — ACETAMINOPHEN 650 MG RE SUPP: 650.0000 mg | RECTAL | Status: DC | PRN

## 2021-12-16 MED ORDER — OXYCODONE HCL 5 MG PO TABS: 10.0000 mg | ORAL_TABLET | ORAL | Status: DC | PRN

## 2021-12-16 MED ORDER — OXYCODONE HCL 5 MG PO TABS: 5.0000 mg | ORAL_TABLET | ORAL | Status: DC | PRN

## 2021-12-16 MED ORDER — ACETAMINOPHEN 500 MG PO TABS
500.0000 mg | ORAL_TABLET | Freq: Four times a day (QID) | ORAL | Status: DC | PRN
  Administered 2021-12-16: 500 mg via ORAL
  Administered 2021-12-16: 1000 mg via ORAL
  Filled 2021-12-16: qty 1
  Filled 2021-12-16: qty 2

## 2021-12-16 MED ORDER — 0.9 % SODIUM CHLORIDE (POUR BTL) OPTIME
TOPICAL | Status: DC | PRN
  Administered 2021-12-16: 1000 mL

## 2021-12-16 MED ORDER — HYDROMORPHONE HCL 1 MG/ML IJ SOLN: 1.0000 mg | INTRAMUSCULAR | Status: DC | PRN

## 2021-12-16 MED ORDER — CHLORHEXIDINE GLUCONATE 0.12 % MT SOLN
15.0000 mL | Freq: Once | OROMUCOSAL | Status: AC
  Administered 2021-12-16: 15 mL via OROMUCOSAL
  Filled 2021-12-16: qty 15

## 2021-12-16 MED ORDER — ONDANSETRON HCL 4 MG/2ML IJ SOLN
INTRAMUSCULAR | Status: DC | PRN
  Administered 2021-12-16: 4 mg via INTRAVENOUS

## 2021-12-16 MED ORDER — LIDOCAINE-EPINEPHRINE 1 %-1:100000 IJ SOLN
INTRAMUSCULAR | Status: DC | PRN
  Administered 2021-12-16: 10 mL

## 2021-12-16 MED ORDER — SODIUM CHLORIDE 0.9% FLUSH
3.0000 mL | Freq: Two times a day (BID) | INTRAVENOUS | Status: DC
  Administered 2021-12-16 (×2): 3 mL via INTRAVENOUS

## 2021-12-16 MED ORDER — ORAL CARE MOUTH RINSE: 15.0000 mL | Freq: Once | OROMUCOSAL | Status: AC

## 2021-12-16 MED ORDER — CEFAZOLIN SODIUM-DEXTROSE 2-4 GM/100ML-% IV SOLN
2.0000 g | INTRAVENOUS | Status: AC
  Administered 2021-12-16: 2 g via INTRAVENOUS
  Filled 2021-12-16: qty 100

## 2021-12-16 MED ORDER — LISINOPRIL 20 MG PO TABS
20.0000 mg | ORAL_TABLET | Freq: Every day | ORAL | Status: DC
  Administered 2021-12-16 – 2021-12-17 (×2): 20 mg via ORAL
  Filled 2021-12-16 (×2): qty 1

## 2021-12-16 MED ORDER — ACETAMINOPHEN 325 MG PO TABS
650.0000 mg | ORAL_TABLET | ORAL | Status: DC | PRN
Start: 2021-12-16 — End: 2021-12-16

## 2021-12-16 MED ORDER — PROPOFOL 10 MG/ML IV BOLUS
INTRAVENOUS | Status: DC | PRN
  Administered 2021-12-16: 50 mg via INTRAVENOUS
  Administered 2021-12-16: 130 mg via INTRAVENOUS

## 2021-12-16 MED ORDER — LIDOCAINE 2% (20 MG/ML) 5 ML SYRINGE
INTRAMUSCULAR | Status: DC | PRN
  Administered 2021-12-16: 40 mg via INTRAVENOUS

## 2021-12-16 SURGICAL SUPPLY — 56 items
BAG COUNTER SPONGE SURGICOUNT (BAG) ×2 IMPLANT
BLADE CLIPPER SURG (BLADE) ×2 IMPLANT
BLADE SURG 11 STRL SS (BLADE) ×4 IMPLANT
BOOT SUTURE AID YELLOW STND (SUTURE) IMPLANT
BUR ACORN 9.0 PRECISION (BURR) ×1 IMPLANT
CANISTER SUCT 3000ML PPV (MISCELLANEOUS) ×2 IMPLANT
CLIP RANEY DISP (INSTRUMENTS) IMPLANT
DERMABOND ADVANCED (GAUZE/BANDAGES/DRESSINGS) ×1
DERMABOND ADVANCED .7 DNX12 (GAUZE/BANDAGES/DRESSINGS) ×1 IMPLANT
DRAPE C-ARM 42X72 X-RAY (DRAPES) ×2 IMPLANT
DRAPE HALF SHEET 40X57 (DRAPES) ×2 IMPLANT
DRAPE INCISE IOBAN 66X45 STRL (DRAPES) ×2 IMPLANT
DRAPE ORTHO SPLIT 77X108 STRL (DRAPES) ×2
DRAPE SURG ORHT 6 SPLT 77X108 (DRAPES) ×2 IMPLANT
DURAPREP 26ML APPLICATOR (WOUND CARE) ×3 IMPLANT
ELECT REM PT RETURN 9FT ADLT (ELECTROSURGICAL) ×2
ELECTRODE REM PT RTRN 9FT ADLT (ELECTROSURGICAL) ×1 IMPLANT
GAUZE 4X4 16PLY ~~LOC~~+RFID DBL (SPONGE) IMPLANT
GLOVE BIO SURGEON STRL SZ7.5 (GLOVE) ×2 IMPLANT
GLOVE ECLIPSE 7.5 STRL STRAW (GLOVE) ×5 IMPLANT
GLOVE EXAM NITRILE XL STR (GLOVE) IMPLANT
GLOVE INDICATOR 7.5 STRL GRN (GLOVE) ×3 IMPLANT
GLOVE SURG UNDER POLY LF SZ7 (GLOVE) ×2 IMPLANT
GLOVE SURG UNDER POLY LF SZ7.5 (GLOVE) ×1 IMPLANT
GOWN STRL REUS W/ TWL LRG LVL3 (GOWN DISPOSABLE) ×2 IMPLANT
GOWN STRL REUS W/ TWL XL LVL3 (GOWN DISPOSABLE) IMPLANT
GOWN STRL REUS W/TWL 2XL LVL3 (GOWN DISPOSABLE) IMPLANT
GOWN STRL REUS W/TWL LRG LVL3 (GOWN DISPOSABLE) ×3
GOWN STRL REUS W/TWL XL LVL3 (GOWN DISPOSABLE) ×1
HEMOSTAT POWDER KIT SURGIFOAM (HEMOSTASIS) ×1 IMPLANT
HEMOSTAT SURGICEL 2X14 (HEMOSTASIS) IMPLANT
KIT BASIN OR (CUSTOM PROCEDURE TRAY) ×2 IMPLANT
KIT TURNOVER KIT B (KITS) ×2 IMPLANT
MARKER SKIN DUAL TIP RULER LAB (MISCELLANEOUS) IMPLANT
NEEDLE HYPO 22GX1.5 SAFETY (NEEDLE) ×2 IMPLANT
NS IRRIG 1000ML POUR BTL (IV SOLUTION) ×2 IMPLANT
PACK LAMINECTOMY NEURO (CUSTOM PROCEDURE TRAY) ×2 IMPLANT
PAD ARMBOARD 7.5X6 YLW CONV (MISCELLANEOUS) ×6 IMPLANT
PASSER CATH 65CM DISP (NEUROSURGERY SUPPLIES) ×2 IMPLANT
SHEATH PERITONEAL INTRO 61 (SHEATH) ×2 IMPLANT
SHUNT LUMBOPERITONEAL 84X14 (Shunt) ×1 IMPLANT
SPIKE FLUID TRANSFER (MISCELLANEOUS) ×1 IMPLANT
SPONGE T-LAP 4X18 ~~LOC~~+RFID (SPONGE) IMPLANT
STAPLER SKIN PROX WIDE 3.9 (STAPLE) ×1 IMPLANT
STRIP CLOSURE SKIN 1/2X4 (GAUZE/BANDAGES/DRESSINGS) IMPLANT
SUT ETHILON 3 0 FSL (SUTURE) IMPLANT
SUT NURALON 4 0 TR CR/8 (SUTURE) IMPLANT
SUT SILK 0 TIES 10X30 (SUTURE) ×1 IMPLANT
SUT SILK 3 0 SH 30 (SUTURE) ×2 IMPLANT
SUT VIC AB 2-0 CP2 18 (SUTURE) ×2 IMPLANT
SUT VIC AB 3-0 SH 8-18 (SUTURE) ×5 IMPLANT
TOWEL GREEN STERILE (TOWEL DISPOSABLE) ×4 IMPLANT
TOWEL GREEN STERILE FF (TOWEL DISPOSABLE) ×2 IMPLANT
TUBE CONNECTING 12X1/4 (SUCTIONS) IMPLANT
UNDERPAD 30X36 HEAVY ABSORB (UNDERPADS AND DIAPERS) ×2 IMPLANT
WATER STERILE IRR 1000ML POUR (IV SOLUTION) ×2 IMPLANT

## 2021-12-16 NOTE — Progress Notes (Signed)
Pt takes coreg PRN with parameters of SBP >150. Pt's BP on admission was 148/69. Spoke with Dr. Smith Robert and he advised to hold pre-op BB. Pt will be treated back in the OR if needed.  Jacqlyn Larsen, RN

## 2021-12-16 NOTE — H&P (Signed)
Surgical H&P Update  HPI: 81 y.o. with a history of NPH with good response to lumbar drain trial and, as expected, recurrence of symptoms a few days after LD removal. No changes in health since they were last seen. Still having his typical NPH symptoms and wishes to proceed with surgery.  PMHx:  Past Medical History:  Diagnosis Date   Depression    2022   Diabetes mellitus    Type II   Hypertension    OSA (obstructive sleep apnea) 11/24/2016   FamHx:  Family History  Problem Relation Age of Onset   Brain cancer Mother    Hypertension Father    Diabetes Father    Stroke Father    SocHx:  reports that he quit smoking about 20 years ago. His smoking use included cigarettes. He has a 30.00 pack-year smoking history. He quit smokeless tobacco use about 30 years ago.  His smokeless tobacco use included chew. He reports that he does not currently use alcohol after a past usage of about 18.0 standard drinks of alcohol per week. He reports that he does not use drugs.  Physical Exam: Aox3, Strength 5/5 x4 and SILTx4   Assesment/Plan: 81 y.o. man with NPH, here for LP shunt placement. Risks, benefits, and alternatives discussed and the patient would like to continue with surgery.  -OR today -3C post-op  Judith Part, MD 12/16/21 9:35 AM

## 2021-12-16 NOTE — Anesthesia Postprocedure Evaluation (Signed)
Anesthesia Post Note  Patient: Randy [name]  Procedure(s) Performed: Lumboperitoneal shunt placement (Right: Flank)     Patient location during evaluation: PACU Anesthesia Type: General Level of consciousness: awake and alert Pain management: pain level controlled Vital Signs Assessment: post-procedure vital signs reviewed and stable Respiratory status: spontaneous breathing, nonlabored ventilation, respiratory function stable and patient connected to nasal cannula oxygen Cardiovascular status: blood pressure returned to baseline and stable Postop Assessment: no apparent nausea or vomiting Anesthetic complications: no   No notable events documented.  Last Vitals:  Vitals:   12/16/21 1325 12/16/21 1346  BP: (!) 147/62 (!) 155/69  Pulse: 69 71  Resp: 20 18  Temp: 36.6 C   SpO2: 100% 96%    Last Pain:  Vitals:   12/16/21 1255  TempSrc:   PainSc: 0-No pain                 Effie Berkshire

## 2021-12-16 NOTE — Anesthesia Preprocedure Evaluation (Addendum)
Anesthesia Evaluation  Patient identified by MRN, date of birth, ID band Patient awake    Reviewed: Allergy & Precautions, NPO status , Patient's Chart, lab work & pertinent test results  Airway Mallampati: III  TM Distance: >3 FB Neck ROM: Full    Dental  (+) Teeth Intact, Dental Advisory Given   Pulmonary sleep apnea , former smoker,    breath sounds clear to auscultation       Cardiovascular hypertension, Pt. on medications  Rhythm:Regular Rate:Normal     Neuro/Psych PSYCHIATRIC DISORDERS Depression negative neurological ROS     GI/Hepatic negative GI ROS, Neg liver ROS,   Endo/Other  diabetes, Type 2, Oral Hypoglycemic Agents  Renal/GU negative Renal ROS     Musculoskeletal negative musculoskeletal ROS (+)   Abdominal Normal abdominal exam  (+)   Peds  Hematology negative hematology ROS (+)   Anesthesia Other Findings   Reproductive/Obstetrics                            Anesthesia Physical Anesthesia Plan  ASA: 3  Anesthesia Plan: General   Post-op Pain Management:    Induction: Intravenous  PONV Risk Score and Plan: 3 and Ondansetron, Dexamethasone and Midazolam  Airway Management Planned: Oral ETT  Additional Equipment: None  Intra-op Plan:   Post-operative Plan: Extubation in OR  Informed Consent: I have reviewed the patients History and Physical, chart, labs and discussed the procedure including the risks, benefits and alternatives for the proposed anesthesia with the patient or authorized representative who has indicated his/her understanding and acceptance.     Dental advisory given  Plan Discussed with: CRNA  Anesthesia Plan Comments:        Anesthesia Quick Evaluation

## 2021-12-16 NOTE — Transfer of Care (Signed)
Immediate Anesthesia Transfer of Care Note  Patient: Randy [name]  Procedure(s) Performed: Lumboperitoneal shunt placement (Right: Flank)  Patient Location: PACU  Anesthesia Type:General  Level of Consciousness: drowsy  Airway & Oxygen Therapy: Patient Spontanous Breathing  Post-op Assessment: Report given to RN, Post -op Vital signs reviewed and stable and Patient moving all extremities X 4  Post vital signs: Reviewed and stable  Last Vitals:  Vitals Value Taken Time  BP 167/75 12/16/21 1153  Temp    Pulse 83 12/16/21 1155  Resp 16 12/16/21 1155  SpO2 99 % 12/16/21 1155  Vitals shown include unvalidated device data.  Last Pain:  Vitals:   12/16/21 0802  TempSrc:   PainSc: 0-No pain         Complications: No notable events documented.

## 2021-12-16 NOTE — Anesthesia Procedure Notes (Signed)
Procedure Name: Intubation Date/Time: 12/16/2021 9:56 AM  Performed by: Vonna Drafts, CRNAPre-anesthesia Checklist: Patient identified, Emergency Drugs available, Suction available and Patient being monitored Patient Re-evaluated:Patient Re-evaluated prior to induction Oxygen Delivery Method: Circle system utilized Preoxygenation: Pre-oxygenation with 100% oxygen Induction Type: IV induction Ventilation: Mask ventilation without difficulty Laryngoscope Size: Mac and 4 Grade View: Grade II Tube type: Oral Tube size: 7.5 mm Number of attempts: 1 Airway Equipment and Method: Stylet and Oral airway Placement Confirmation: ETT inserted through vocal cords under direct vision, positive ETCO2 and breath sounds checked- equal and bilateral Secured at: 23 cm Tube secured with: Tape Dental Injury: Teeth and Oropharynx as per pre-operative assessment  Comments: Placed by Orland Mustard SRNA

## 2021-12-16 NOTE — Op Note (Signed)
PATIENT: Randy [name]  DAY OF SURGERY: 12/16/21   PRE-OPERATIVE DIAGNOSIS:  Normal pressure hydrocephalus   POST-OPERATIVE DIAGNOSIS:  Same   PROCEDURE:  Lumboperitoneal shunt placement   SURGEON:  Surgeon(s) and Role:    Judith Part, MD - Primary    Dawley, Pieter Partridge, DO - Assisting   ANESTHESIA: ETGA   BRIEF HISTORY: This is an 81 year old man who presented with typical symptoms of NPH with ventriculomegaly. He had a good response to a lumbar drain trial, I therefore recommend shunt placement in the form of an LP shunt. This was discussed with the patient as well as risks, benefits, and alternatives and wished to proceed with surgery.   OPERATIVE DETAIL: The patient was taken to the operating room, anesthesia was induced by the anesthesia team, and the patient was placed on the OR table in the lateral decubitus position and secured to the table after padding of all pressure points. A formal time out was performed with two patient identifiers and confirmed the operative site. The operative site was marked, hair was clipped with surgical clippers, the area was then prepped and draped in a sterile fashion.   The proximal catheter was placed first by creating a posterior lumbar midline incision and using fluoroscopy with a Tuohy needle to access the thecal sac. I got access at L4-5 but flow was slow and the catheter would not pass. I therefore used the same incision to redirect the Tuohy needle with fluoroscopy and accessed L3-4, where flow was much better and the catheter threaded without resistance. It was secured with suture, and tunneled it to the flank valve site. An incision was created there, pocket was created, and the valve was connected to the proximal catheter and secured. The distal catheter was tunneled to the abdominal insertion site, the proximal end was connected to the valve, good flow was confirmed, and the abdominal incision was opened. Dr. Reatha Armour assisted and created a  small laparotomy after tunneling. The catheter was passed without resistance into the peritoneal cavity.  The wounds were copiously irrigated, all instrument and sponge counts were correct, and the incisions were then closed in layers. The patient was then returned to anesthesia for emergence. No apparent complications at the completion of the procedure.   EBL:  9m   DRAINS: none   IMPLANTS: Medtronic LP Shunt system with Strata valve set to 2.0   TJudith Part MD 12/16/21 9:45 AM

## 2021-12-17 ENCOUNTER — Encounter (HOSPITAL_COMMUNITY): Payer: Self-pay | Admitting: Neurological Surgery

## 2021-12-17 LAB — GLUCOSE, CAPILLARY: Glucose-Capillary: 116 mg/dL — ABNORMAL HIGH (ref 70–99)

## 2021-12-17 MED ORDER — HYDROCODONE-ACETAMINOPHEN 5-325 MG PO TABS: 1.0000 | ORAL_TABLET | ORAL | 0 refills | Status: DC | PRN

## 2021-12-17 NOTE — Evaluation (Signed)
Occupational Therapy Evaluation Patient Details Name: Randy [name] MRN: [id number] DOB: 13-May-1941 Today's Date: 12/17/2021   History of Present Illness 81 y.o. male presents to [facility] hospital on 08/17/2021 with suspected NPH. Pt underwent lumbar drain placement on 08/17/2021. PMH includes DMII, HTN, OSA.   Clinical Impression   Patient admitted for the diagnosis above.  PTA he lives with his spouse, who is able to assist as needed.  Currently he is only needing generalized supervision, and minimal safety cues.  He should recover well.  No further needs in the acute setting.        Recommendations for follow up therapy are one component of a multi-disciplinary discharge planning process, led by the attending physician.  Recommendations may be updated based on patient status, additional functional criteria and insurance authorization.   Follow Up Recommendations  No OT follow up    Assistance Recommended at Discharge Set up Supervision/Assistance  Patient can return home with the following      Functional Status Assessment  Patient has not had a recent decline in their functional status  Equipment Recommendations  None recommended by OT    Recommendations for Other Services       Precautions / Restrictions Precautions Precautions: Fall Restrictions Weight Bearing Restrictions: No      Mobility Bed Mobility Overal bed mobility: Modified Independent                  Transfers Overall transfer level: Needs assistance   Transfers: Sit to/from Stand, Bed to chair/wheelchair/BSC Sit to Stand: Modified independent (Device/Increase time)     Step pivot transfers: Supervision            Balance Overall balance assessment: Mild deficits observed, not formally tested                                         ADL either performed or assessed with clinical judgement   ADL Overall ADL's : At baseline                                              Vision Baseline Vision/History: 1 Wears glasses Patient Visual Report: No change from baseline       Perception Perception Perception: Within Functional Limits   Praxis Praxis Praxis: Intact    Pertinent Vitals/Pain Pain Assessment Pain Assessment: No/denies pain     Hand Dominance Right   Extremity/Trunk Assessment Upper Extremity Assessment Upper Extremity Assessment: Overall WFL for tasks assessed   Lower Extremity Assessment Lower Extremity Assessment: Defer to PT evaluation   Cervical / Trunk Assessment Cervical / Trunk Assessment: Kyphotic   Communication Communication Communication: No difficulties   Cognition Arousal/Alertness: Awake/alert Behavior During Therapy: WFL for tasks assessed/performed Overall Cognitive Status: Within Functional Limits for tasks assessed                                 General Comments: decreased safety     General Comments   VSS on RA    Exercises     Shoulder Instructions      Home Living Family/patient expects to be discharged to:: Private residence Living Arrangements: Spouse/significant other Available Help at Discharge: Family;Available 24 hours/day Type of  Home: House Home Access: Stairs to enter [employer] of Steps: 4 for front porch Entrance Stairs-Rails: None Home Layout: One level     Bathroom Shower/Tub: Occupational psychologist: Handicapped height     Home Equipment: Conservation officer, nature (2 wheels);Shower seat          Prior Functioning/Environment Prior Level of Function : Independent/Modified Independent             Mobility Comments: driving, mows the yard, walks without AD ADLs Comments: performs ADLs on his own and cooks.  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        OT Problem List: Impaired balance (sitting and/or standing)      OT Treatment/Interventions:      OT Goals(Current  goals can be found in the care plan section) Acute Rehab OT Goals Patient Stated Goal: Return home OT Goal Formulation: With patient Time For Goal Achievement: 12/20/21 Potential to Achieve Goals: Good  OT Frequency:      Co-evaluation              AM-PAC OT "6 Clicks" Daily Activity     Outcome Measure Help from another person eating meals?: None Help from another person taking care of personal grooming?: None Help from another person toileting, which includes using toliet, bedpan, or urinal?: None Help from another person bathing (including washing, rinsing, drying)?: A Little Help from another person to put on and taking off regular upper body clothing?: None Help from another person to put on and taking off regular lower body clothing?: A Little 6 Click Score: 22   End of Session Nurse Communication: Mobility status  Activity Tolerance: Patient tolerated treatment well Patient left: in chair;with call bell/phone within reach  OT Visit Diagnosis: Unsteadiness on feet (R26.81)                Time: 6503-5465 OT Time Calculation (min): 19 min Charges:  OT General Charges $OT Visit: 1 Visit OT Evaluation $OT Eval Moderate Complexity: 1 Mod  12/17/2021  RP, OTR/L  Acute Rehabilitation Services  Office:  [phone]   Metta Clines 12/17/2021, 9:53 AM

## 2021-12-17 NOTE — Progress Notes (Signed)
Neurosurgery Service Progress Note  Subjective: No acute events overnight, some incisional pain but otherwise doing well, gait subjectively stable to preop so far   Objective: Vitals:   12/16/21 2040 12/17/21 0008 12/17/21 0342 12/17/21 0734  BP: (!) 132/56 (!) 131/51 (!) 144/53 (!) 128/52  Pulse: 64 65 (!) 59 71  Resp: '20 20 18 17  '$ Temp: 98.4 F (36.9 C) 98.1 F (36.7 C) 98 F (36.7 C) 98.4 F (36.9 C)  TempSrc: Oral Oral Oral Oral  SpO2: 95% 99% 97% 99%  Weight:      Height:        Physical Exam: Strength 5/5 x4 and SILTx4   Assessment & Plan: 81 y.o. man w/ NPH s/p LP shunt placement, Strata set to 2.0, recovering well.  -discharge home today  Judith Part  12/17/21 8:39 AM

## 2021-12-17 NOTE — Discharge Instructions (Signed)
  Call Your Doctor If Any of These Occur Redness, drainage, or swelling at the wound.  Temperature greater than 101 degrees. Severe pain not relieved by pain medication. Incision starts to come apart.  

## 2021-12-17 NOTE — Discharge Summary (Signed)
Discharge Summary  Date of Admission: 12/16/2021  Date of Discharge: 12/17/21  Attending Physician: Emelda Brothers, MD  Hospital Course: Patient was admitted following an uncomplicated LP shunt placement with a Strata set to 2.0. They were recovered in PACU and transferred to [facility]. Their hospital course was uncomplicated and the patient was discharged home on 12/17/21. They will follow up in clinic with me in clinic in 2 weeks.  Neurologic exam at discharge:  Strength 5/5 x4 and SILTx4   Discharge diagnosis: Normal pressure hydrocephalu  Judith Part, MD 12/17/21 8:39 AM

## 2021-12-17 NOTE — Progress Notes (Signed)
Patient alert and oriented, mae's well, voiding adequate amount of urine, swallowing without difficulty, no c/o pain at time of discharge. Patient discharged home with family. Script and discharged instructions given to patient. Patient and family stated understanding of instructions given. Patient has an appointment with Dr. Ostergard in 2 weeks 

## 2021-12-17 NOTE — Evaluation (Signed)
Physical Therapy Evaluation Patient Details Name: Randy [name] MRN: [id number] DOB: 11/20/1940 Today's Date: 12/17/2021  History of Present Illness  81 y.o. male presents to [facility] hospital on 08/17/2021 with suspected NPH. Pt underwent lumbar drain placement on 08/17/2021. PMH includes DMII, HTN, OSA.  Clinical Impression  Patient admitted following above procedure. Patient functioning at modI level for mobility with no AD. Patient has mild balance deficits but able to self correct. Educated patient on limiting heavy lifting or excessive bending/twisting,  patient verbalized understanding. No further skilled PT needs identified acutely No PT follow up recommended at this time.        Recommendations for follow up therapy are one component of a multi-disciplinary discharge planning process, led by the attending physician.  Recommendations may be updated based on patient status, additional functional criteria and insurance authorization.  Follow Up Recommendations No PT follow up    Assistance Recommended at Discharge PRN  Patient can return home with the following       Equipment Recommendations None recommended by PT  Recommendations for Other Services       Functional Status Assessment Patient has had a recent decline in their functional status and demonstrates the ability to make significant improvements in function in a reasonable and predictable amount of time.     Precautions / Restrictions Precautions Precautions: Fall Restrictions Weight Bearing Restrictions: No      Mobility  Bed Mobility               General bed mobility comments: sitting in recliner on arrival    Transfers Overall transfer level: Modified independent Equipment used: None                    Ambulation/Gait Ambulation/Gait assistance: Modified independent (Device/Increase time) Gait Distance (Feet): 400 Feet Assistive device: None Gait Pattern/deviations: WFL(Within Functional  Limits)   Gait velocity interpretation: >4.37 ft/sec, indicative of normal walking speed   General Gait Details: patient reporting improved gait quality since surgery  Stairs Stairs: Yes Stairs assistance: Modified independent (Device/Increase time) Stair Management: One rail Left, Alternating pattern, Forwards Number of Stairs: 3    Wheelchair Mobility    Modified Rankin (Stroke Patients Only)       Balance Overall balance assessment: Mild deficits observed, not formally tested                                           Pertinent Vitals/Pain Pain Assessment Pain Assessment: No/denies pain    Home Living Family/patient expects to be discharged to:: Private residence Living Arrangements: Spouse/significant other Available Help at Discharge: Family;Available 24 hours/day Type of Home: House Home Access: Stairs to enter Entrance Stairs-Rails: None Entrance Stairs-Number of Steps: 2   Home Layout: One level Home Equipment: Conservation officer, nature (2 wheels);Shower seat      Prior Function Prior Level of Function : Independent/Modified Independent             Mobility Comments: driving, mows the yard, walks without AD ADLs Comments: performs ADLs on his own and cooks.  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     Hand Dominance   Dominant Hand: Right    Extremity/Trunk Assessment   Upper Extremity Assessment Upper Extremity Assessment: Defer to OT evaluation    Lower Extremity Assessment Lower Extremity Assessment: Overall [facility]  for tasks assessed    Cervical / Trunk Assessment Cervical / Trunk Assessment: Kyphotic  Communication   Communication: No difficulties  Cognition Arousal/Alertness: Awake/alert Behavior During Therapy: WFL for tasks assessed/performed Overall Cognitive Status: Within Functional Limits for tasks assessed                                           General Comments      Exercises     Assessment/Plan    PT Assessment Patient does not need any further PT services  PT Problem List         PT Treatment Interventions      PT Goals (Current goals can be found in the Care Plan section)  Acute Rehab PT Goals Patient Stated Goal: to go home PT Goal Formulation: All assessment and education complete, DC therapy    Frequency       Co-evaluation               AM-PAC PT "6 Clicks" Mobility  Outcome Measure Help needed turning from your back to your side while in a flat bed without using bedrails?: None Help needed moving from lying on your back to sitting on the side of a flat bed without using bedrails?: None Help needed moving to and from a bed to a chair (including a wheelchair)?: None Help needed standing up from a chair using your arms (e.g., wheelchair or bedside chair)?: None Help needed to walk in hospital room?: None Help needed climbing 3-5 steps with a railing? : None 6 Click Score: 24    End of Session   Activity Tolerance: Patient tolerated treatment well Patient left: in chair;with call bell/phone within reach;with family/visitor present Nurse Communication: Mobility status PT Visit Diagnosis: Muscle weakness (generalized) (M62.81);Unsteadiness on feet (R26.81)    Time: 1751-0258 PT Time Calculation (min) (ACUTE ONLY): 14 min   Charges:   PT Evaluation $PT Eval Low Complexity: 1 Low          Randy [name] A. Gilford Rile PT, DPT Acute Rehabilitation Services Office [phone]   Linna Hoff 12/17/2021, 10:20 AM

## 2021-12-18 ENCOUNTER — Other Ambulatory Visit: Payer: Self-pay | Admitting: Adult Health

## 2022-01-03 ENCOUNTER — Other Ambulatory Visit: Payer: Self-pay | Admitting: Neurological Surgery

## 2022-01-03 DIAGNOSIS — G912 (Idiopathic) normal pressure hydrocephalus: Secondary | ICD-10-CM

## 2022-01-13 ENCOUNTER — Encounter: Payer: Self-pay | Admitting: Adult Health

## 2022-01-13 NOTE — Telephone Encounter (Signed)
Please advise 

## 2022-01-14 ENCOUNTER — Ambulatory Visit
Admission: RE | Admit: 2022-01-14 | Discharge: 2022-01-14 | Disposition: A | Discharge status: Home / Self Care | Payer: Medicare PPO | Source: Ambulatory Visit | Attending: Neurological Surgery | Admitting: Neurological Surgery

## 2022-01-14 DIAGNOSIS — I672 Cerebral atherosclerosis: Secondary | ICD-10-CM | POA: Diagnosis not present

## 2022-01-14 DIAGNOSIS — G912 (Idiopathic) normal pressure hydrocephalus: Secondary | ICD-10-CM | POA: Diagnosis not present

## 2022-01-14 DIAGNOSIS — Z9889 Other specified postprocedural states: Secondary | ICD-10-CM | POA: Diagnosis not present

## 2022-01-18 NOTE — Telephone Encounter (Signed)
Spoke to Jan and advised of Cory's message. Jan will return call if she finds pt a therapist.

## 2022-01-21 ENCOUNTER — Other Ambulatory Visit: Payer: Self-pay | Admitting: Adult Health

## 2022-02-03 ENCOUNTER — Ambulatory Visit: Payer: Medicare PPO | Admitting: Psychiatry

## 2022-02-07 ENCOUNTER — Telehealth: Payer: Self-pay | Admitting: Adult Health

## 2022-02-07 NOTE — Telephone Encounter (Addendum)
Pt's spouse called asking why pharmacy still does not have the prescription for her husband's  metFORMIN (GLUCOPHAGE) 1000 MG tablet  She stated Pt only has 2 pills left and needs this prescription as soon as possible.  Please advise. [address] [facility] [id number] [place], [place] Phone:  [phone]  Fax:  [phone]      [phone] - Cathie Olden

## 2022-02-07 NOTE — Telephone Encounter (Addendum)
Last OV 03/19/21; last A1c 11/03/20  Wife call & notified of above & that appt will be needed. Appt scheduled for 02/09/22 with PCP.

## 2022-02-09 ENCOUNTER — Emergency Department (HOSPITAL_BASED_OUTPATIENT_CLINIC_OR_DEPARTMENT_OTHER): Payer: Medicare PPO | Admitting: Radiology

## 2022-02-09 ENCOUNTER — Other Ambulatory Visit: Payer: Self-pay

## 2022-02-09 ENCOUNTER — Emergency Department (HOSPITAL_BASED_OUTPATIENT_CLINIC_OR_DEPARTMENT_OTHER)
Admission: EM | Admit: 2022-02-09 | Discharge: 2022-02-09 | Disposition: A | Discharge status: Home / Self Care | Payer: Medicare PPO | Attending: Emergency Medicine | Admitting: Emergency Medicine

## 2022-02-09 ENCOUNTER — Encounter (HOSPITAL_BASED_OUTPATIENT_CLINIC_OR_DEPARTMENT_OTHER): Payer: Self-pay | Admitting: Emergency Medicine

## 2022-02-09 ENCOUNTER — Ambulatory Visit: Payer: Medicare PPO | Admitting: Adult Health

## 2022-02-09 ENCOUNTER — Emergency Department (HOSPITAL_BASED_OUTPATIENT_CLINIC_OR_DEPARTMENT_OTHER): Payer: Medicare PPO

## 2022-02-09 DIAGNOSIS — Z79899 Other long term (current) drug therapy: Secondary | ICD-10-CM | POA: Diagnosis not present

## 2022-02-09 DIAGNOSIS — Z7984 Long term (current) use of oral hypoglycemic drugs: Secondary | ICD-10-CM | POA: Insufficient documentation

## 2022-02-09 DIAGNOSIS — G319 Degenerative disease of nervous system, unspecified: Secondary | ICD-10-CM | POA: Diagnosis not present

## 2022-02-09 DIAGNOSIS — E119 Type 2 diabetes mellitus without complications: Secondary | ICD-10-CM | POA: Diagnosis not present

## 2022-02-09 DIAGNOSIS — R9431 Abnormal electrocardiogram [ECG] [EKG]: Secondary | ICD-10-CM | POA: Diagnosis not present

## 2022-02-09 DIAGNOSIS — G912 (Idiopathic) normal pressure hydrocephalus: Secondary | ICD-10-CM | POA: Insufficient documentation

## 2022-02-09 DIAGNOSIS — R32 Unspecified urinary incontinence: Secondary | ICD-10-CM | POA: Diagnosis not present

## 2022-02-09 DIAGNOSIS — R410 Disorientation, unspecified: Secondary | ICD-10-CM | POA: Diagnosis not present

## 2022-02-09 DIAGNOSIS — Z0389 Encounter for observation for other suspected diseases and conditions ruled out: Secondary | ICD-10-CM | POA: Diagnosis not present

## 2022-02-09 DIAGNOSIS — R4182 Altered mental status, unspecified: Secondary | ICD-10-CM | POA: Diagnosis not present

## 2022-02-09 DIAGNOSIS — I10 Essential (primary) hypertension: Secondary | ICD-10-CM | POA: Insufficient documentation

## 2022-02-09 DIAGNOSIS — R262 Difficulty in walking, not elsewhere classified: Secondary | ICD-10-CM | POA: Diagnosis not present

## 2022-02-09 HISTORY — DX: (Idiopathic) normal pressure hydrocephalus: G91.2

## 2022-02-09 LAB — COMPREHENSIVE METABOLIC PANEL
ALT: 14 U/L (ref 0–44)
AST: 14 U/L — ABNORMAL LOW (ref 15–41)
Albumin: 4.9 g/dL (ref 3.5–5.0)
Alkaline Phosphatase: 69 U/L (ref 38–126)
Anion gap: 8 (ref 5–15)
BUN: 23 mg/dL (ref 8–23)
CO2: 33 mmol/L — ABNORMAL HIGH (ref 22–32)
Calcium: 11.6 mg/dL — ABNORMAL HIGH (ref 8.9–10.3)
Chloride: 99 mmol/L (ref 98–111)
Creatinine, Ser: 1.38 mg/dL — ABNORMAL HIGH (ref 0.61–1.24)
GFR, Estimated: 51 mL/min — ABNORMAL LOW (ref >60)
Glucose, Bld: 136 mg/dL — ABNORMAL HIGH (ref 70–99)
Potassium: 4.2 mmol/L (ref 3.5–5.1)
Sodium: 140 mmol/L (ref 135–145)
Total Bilirubin: 0.4 mg/dL (ref 0.3–1.2)
Total Protein: 7.6 g/dL (ref 6.5–8.1)

## 2022-02-09 LAB — URINALYSIS, ROUTINE W REFLEX MICROSCOPIC
Bilirubin Urine: NEGATIVE
Glucose, UA: NEGATIVE mg/dL
Hgb urine dipstick: NEGATIVE
Ketones, ur: NEGATIVE mg/dL
Leukocytes,Ua: NEGATIVE
Nitrite: NEGATIVE
Protein, ur: NEGATIVE mg/dL
Specific Gravity, Urine: 1.022 (ref 1.005–1.030)
pH: 6 (ref 5.0–8.0)

## 2022-02-09 LAB — CBC
HCT: 45.7 % (ref 39.0–52.0)
Hemoglobin: 14.7 g/dL (ref 13.0–17.0)
MCH: 32 pg (ref 26.0–34.0)
MCHC: 32.2 g/dL (ref 30.0–36.0)
MCV: 99.3 fL (ref 80.0–100.0)
Platelets: 196 10*3/uL (ref 150–400)
RBC: 4.6 MIL/uL (ref 4.22–5.81)
RDW: 13 % (ref 11.5–15.5)
WBC: 10 10*3/uL (ref 4.0–10.5)
nRBC: 0 % (ref 0.0–0.2)

## 2022-02-09 LAB — CBG MONITORING, ED: Glucose-Capillary: 131 mg/dL — ABNORMAL HIGH (ref 70–99)

## 2022-02-09 NOTE — ED Notes (Signed)
Patient transported to CT 

## 2022-02-09 NOTE — Discharge Instructions (Addendum)
Please follow-up with your neurosurgery clinic tomorrow for shunt adjustments.  Return back to the emergency department if you experience any of the following: Worsening confusion, falls, headache, fever, or any other concerning symptoms.

## 2022-02-09 NOTE — ED Triage Notes (Signed)
Incontinence, falling and mind fog. Recent shunt for nph. Worsening in the last week

## 2022-02-09 NOTE — ED Provider Notes (Signed)
[facility] EMERGENCY DEPT Provider Note   CSN: [id number] Arrival date & time: 02/09/22  1506     History {Add pertinent medical, surgical, social history, OB history to HPI:1} Chief Complaint  Patient presents with   Altered Mental Status    Randy [name] is a 81 y.o. male.  81 yo M with hx of NPH sp VP shunt placement on 12/2021 who presents with confusion, difficulty walking, and incontinence. Wife states that he was having these symptoms 2 weeks ago before presenting to [facility] to have his shunt adjusted by Dr Zada Finders. Says that he was doing much better but then this Sunday had recurrence of his symptoms. Now has a shuffling gait and is occasionally confused and takes longer to answer questions than usual. Also with frequent incontinence. Did fall off the toilet today but no head strike or other injuries. Has very mild headache now. Denies any other pain or recent illnesses.    Altered Mental Status      Home Medications Prior to Admission medications   Medication Sig Start Date End Date Taking? Authorizing Provider  Blood Glucose Calibration (TRUE METRIX LEVEL 1) Low SOLN USE TO CHECK METER PRIOR TO TESTING SUGAR 11/09/17   Marletta Lor, MD  Blood Glucose Monitoring Suppl (TRUE METRIX AIR GLUCOSE METER) w/Device KIT 1 each by Does not apply route daily. 11/09/17   Marletta Lor, MD  carvedilol (COREG) 6.25 MG tablet Take 1 tablet (6.25 mg total) by mouth 2 (two) times daily with a meal. Patient taking differently: Take 6.25 mg by mouth 2 (two) times daily as needed (systolic blood pressure >009.). 08/27/21 12/08/23  Nafziger, Tommi Rumps, NP  Cholecalciferol (VITAMIN D-3 PO) Take 1 capsule by mouth in the morning.    [provider]  citalopram (CELEXA) 10 MG tablet Take 1 tablet (10 mg total) by mouth in the morning. 11/23/21   Nafziger, Tommi Rumps, NP  clobetasol (OLUX) 0.05 % topical foam Apply topically 2 (two) times daily. Patient not taking:  Reported on 12/07/2021 09/27/21   Lavonna Monarch, MD  folic acid (FOLVITE) 1 MG tablet Take 1 tablet (1 mg total) by mouth daily. 11/24/21   Nafziger, Tommi Rumps, NP  HYDROcodone-acetaminophen (NORCO/VICODIN) 5-325 MG tablet Take 1 tablet by mouth every 4 (four) hours as needed for moderate pain. 12/17/21   Judith Part, MD  ibuprofen (ADVIL) 200 MG tablet Take 400 mg by mouth every 8 (eight) hours as needed (for pain.).    [provider]  lisinopril-hydrochlorothiazide (ZESTORETIC) 20-12.5 MG tablet TAKE ONE TABLET BY MOUTH DAILY 09/28/21   Nafziger, Tommi Rumps, NP  metFORMIN (GLUCOPHAGE) 1000 MG tablet TAKE 1 TABLET BY MOUTH DAILY (1000 MG TOTAL) WITH BREAKFAST; **MUST CALL MD FOR APPOINTMENT FOR FUTURE REFILLS ! 01/21/22   Dorothyann Peng, NP  Multiple Vitamin (MULTIVITAMIN WITH MINERALS) TABS tablet Take 1 tablet by mouth daily. 08/03/21   Mercy Riding, MD  thiamine 100 MG tablet Take 1 tablet (100 mg total) by mouth daily. 08/03/21   Mercy Riding, MD  TRUE METRIX BLOOD GLUCOSE TEST test strip USE TO TEST BLOOD GLUCOSE TWICE DAILY. 06/06/19   Nafziger, Tommi Rumps, NP  TRUEPLUS LANCETS 33G MISC USE TO CHECK BLOOD SUGAR DAILY AND PRN 11/09/17   Marletta Lor, MD  vitamin B-12 (CYANOCOBALAMIN) 1000 MCG tablet Take 1,000 mcg by mouth every Monday, Tuesday, Wednesday, Thursday, and Friday.    [provider]      Allergies    Lipitor [atorvastatin]  Review of Systems   Review of Systems  Physical Exam Updated Vital Signs BP (!) 134/58 (BP Location: Right Arm)   Pulse 67   Temp 98 F (36.7 C)   Resp 16   SpO2 100%  Physical Exam Vitals and nursing note reviewed.  Constitutional:      General: He is not in acute distress.    Appearance: He is well-developed.  HENT:     Head: Normocephalic and atraumatic.     Right Ear: External ear normal.     Left Ear: External ear normal.     Nose: Nose normal.  Eyes:     Extraocular Movements: Extraocular movements intact.      Conjunctiva/sclera: Conjunctivae normal.     Pupils: Pupils are equal, round, and reactive to light.  Cardiovascular:     Rate and Rhythm: Normal rate and regular rhythm.  Pulmonary:     Effort: Pulmonary effort is normal. No respiratory distress.  Abdominal:     General: There is no distension.     Palpations: Abdomen is soft. There is no mass.     Tenderness: There is no abdominal tenderness. There is no guarding.     Comments: Scars from prior surgery  Musculoskeletal:        General: No swelling.     Cervical back: Normal range of motion and neck supple.     Right lower leg: No edema.     Left lower leg: No edema.  Skin:    General: Skin is warm and dry.     Capillary Refill: Capillary refill takes less than 2 seconds.  Neurological:     Mental Status: He is alert.     Comments: MENTAL STATUS: AAOx3 CRANIAL NERVES: II: Pupils equal and reactive 3 mm BL, no RAPD, no VF deficits III, IV, VI: EOM intact, no gaze preference or deviation, no nystagmus. V: normal sensation to light touch in V1, V2, and V3 segments bilaterally VII: no facial weakness or asymmetry, no nasolabial fold flattening VIII: normal hearing to speech and finger friction IX, X: normal palatal elevation, no uvular deviation XI: 5/5 head turn and 5/5 shoulder shrug bilaterally XII: midline tongue protrusion MOTOR: 5/5 strength in R shoulder flexion, elbow flexion and extension, and grip strength. 5/5 strength in L shoulder flexion, elbow flexion and extension, and grip strength.  5/5 strength in R hip and knee flexion, knee extension, ankle plantar and dorsiflexion. 5/5 strength in L hip and knee flexion, knee extension, ankle plantar and dorsiflexion. SENSORY: Normal sensation to light touch in all extremities COORD: Normal finger to nose and heel to shin, no tremor, no dysmetria   Psychiatric:        Mood and Affect: Mood normal.        Behavior: Behavior normal.     ED Results / Procedures / Treatments    Labs (all labs ordered are listed, but only abnormal results are displayed) Labs Reviewed  CBG MONITORING, ED - Abnormal; Notable for the following components:      Result Value   Glucose-Capillary 131 (*)    All other components within normal limits  CBC  COMPREHENSIVE METABOLIC PANEL  URINALYSIS, ROUTINE W REFLEX MICROSCOPIC  CBG MONITORING, ED    EKG None  Radiology No results found.  Procedures Procedures  {Document cardiac monitor, telemetry assessment procedure when appropriate:1}  Medications Ordered in ED Medications - No data to display  ED Course/ Medical Decision Making/ A&P  Medical Decision Making Amount and/or Complexity of Data Reviewed Labs: ordered. Radiology: ordered.   ***  {Document critical care time when appropriate:1} {Document review of labs and clinical decision tools ie heart score, Chads2Vasc2 etc:1}  {Document your independent review of radiology images, and any outside records:1} {Document your discussion with family members, caretakers, and with consultants:1} {Document social determinants of health affecting pt's care:1} {Document your decision making why or why not admission, treatments were needed:1} Final Clinical Impression(s) / ED Diagnoses Final diagnoses:  None    Rx / DC Orders ED Discharge Orders     None

## 2022-02-10 ENCOUNTER — Encounter (HOSPITAL_BASED_OUTPATIENT_CLINIC_OR_DEPARTMENT_OTHER): Payer: Self-pay | Admitting: Emergency Medicine

## 2022-02-11 ENCOUNTER — Encounter (HOSPITAL_COMMUNITY): Payer: Self-pay

## 2022-02-11 ENCOUNTER — Encounter: Payer: Self-pay | Admitting: Adult Health

## 2022-02-11 ENCOUNTER — Ambulatory Visit (INDEPENDENT_AMBULATORY_CARE_PROVIDER_SITE_OTHER): Payer: Medicare PPO | Admitting: Adult Health

## 2022-02-11 ENCOUNTER — Inpatient Hospital Stay (HOSPITAL_COMMUNITY)
Admission: AD | Admit: 2022-02-11 | Discharge: 2022-02-15 | DRG: 029 | Disposition: A | Discharge status: Home / Self Care | Payer: Medicare PPO | Attending: Neurological Surgery | Admitting: Neurological Surgery

## 2022-02-11 VITALS — BP 120/60 | HR 77 | Temp 97.7°F | Ht 68.0 in | Wt 167.0 lb

## 2022-02-11 DIAGNOSIS — G4733 Obstructive sleep apnea (adult) (pediatric): Secondary | ICD-10-CM | POA: Diagnosis present

## 2022-02-11 DIAGNOSIS — G912 (Idiopathic) normal pressure hydrocephalus: Secondary | ICD-10-CM

## 2022-02-11 DIAGNOSIS — Z87891 Personal history of nicotine dependence: Secondary | ICD-10-CM | POA: Diagnosis not present

## 2022-02-11 DIAGNOSIS — T85615A Breakdown (mechanical) of other nervous system device, implant or graft, initial encounter: Secondary | ICD-10-CM | POA: Diagnosis present

## 2022-02-11 DIAGNOSIS — G4730 Sleep apnea, unspecified: Secondary | ICD-10-CM | POA: Diagnosis not present

## 2022-02-11 DIAGNOSIS — F339 Major depressive disorder, recurrent, unspecified: Secondary | ICD-10-CM | POA: Diagnosis not present

## 2022-02-11 DIAGNOSIS — Z833 Family history of diabetes mellitus: Secondary | ICD-10-CM | POA: Diagnosis not present

## 2022-02-11 DIAGNOSIS — E1100 Type 2 diabetes mellitus with hyperosmolarity without nonketotic hyperglycemic-hyperosmolar coma (NKHHC): Secondary | ICD-10-CM | POA: Diagnosis not present

## 2022-02-11 DIAGNOSIS — I10 Essential (primary) hypertension: Secondary | ICD-10-CM | POA: Diagnosis present

## 2022-02-11 DIAGNOSIS — F32A Depression, unspecified: Secondary | ICD-10-CM | POA: Diagnosis present

## 2022-02-11 DIAGNOSIS — N3289 Other specified disorders of bladder: Secondary | ICD-10-CM | POA: Diagnosis not present

## 2022-02-11 DIAGNOSIS — Y752 Prosthetic and other implants, materials and neurological devices associated with adverse incidents: Secondary | ICD-10-CM | POA: Diagnosis present

## 2022-02-11 DIAGNOSIS — R410 Disorientation, unspecified: Secondary | ICD-10-CM | POA: Diagnosis present

## 2022-02-11 DIAGNOSIS — G9340 Encephalopathy, unspecified: Secondary | ICD-10-CM | POA: Diagnosis present

## 2022-02-11 DIAGNOSIS — E119 Type 2 diabetes mellitus without complications: Secondary | ICD-10-CM | POA: Diagnosis present

## 2022-02-11 DIAGNOSIS — T85698A Other mechanical complication of other specified internal prosthetic devices, implants and grafts, initial encounter: Secondary | ICD-10-CM | POA: Diagnosis not present

## 2022-02-11 DIAGNOSIS — Z8249 Family history of ischemic heart disease and other diseases of the circulatory system: Secondary | ICD-10-CM | POA: Diagnosis not present

## 2022-02-11 DIAGNOSIS — N400 Enlarged prostate without lower urinary tract symptoms: Secondary | ICD-10-CM | POA: Diagnosis not present

## 2022-02-11 DIAGNOSIS — T85618A Breakdown (mechanical) of other specified internal prosthetic devices, implants and grafts, initial encounter: Secondary | ICD-10-CM | POA: Diagnosis not present

## 2022-02-11 LAB — CBC
HCT: 37.4 % — ABNORMAL LOW (ref 39.0–52.0)
Hemoglobin: 12.5 g/dL — ABNORMAL LOW (ref 13.0–17.0)
MCH: 32.7 pg (ref 26.0–34.0)
MCHC: 33.4 g/dL (ref 30.0–36.0)
MCV: 97.9 fL (ref 80.0–100.0)
Platelets: 165 10*3/uL (ref 150–400)
RBC: 3.82 MIL/uL — ABNORMAL LOW (ref 4.22–5.81)
RDW: 12.8 % (ref 11.5–15.5)
WBC: 6.9 10*3/uL (ref 4.0–10.5)
nRBC: 0 % (ref 0.0–0.2)

## 2022-02-11 MED ORDER — DOCUSATE SODIUM 100 MG PO CAPS
100.0000 mg | ORAL_CAPSULE | Freq: Two times a day (BID) | ORAL | Status: DC
  Administered 2022-02-13 – 2022-02-15 (×2): 100 mg via ORAL
  Filled 2022-02-11 (×3): qty 1

## 2022-02-11 MED ORDER — METFORMIN HCL 1000 MG PO TABS: ORAL_TABLET | ORAL | 1 refills | Status: AC

## 2022-02-11 MED ORDER — POLYETHYLENE GLYCOL 3350 17 G PO PACK: 17.0000 g | PACK | Freq: Every day | ORAL | Status: DC | PRN

## 2022-02-11 MED ORDER — ONDANSETRON HCL 4 MG/2ML IJ SOLN: 4.0000 mg | Freq: Four times a day (QID) | INTRAMUSCULAR | Status: DC | PRN

## 2022-02-11 MED ORDER — PHENOL 1.4 % MT LIQD: 1.0000 | OROMUCOSAL | Status: DC | PRN

## 2022-02-11 MED ORDER — ACETAMINOPHEN 325 MG PO TABS: 650.0000 mg | ORAL_TABLET | ORAL | Status: DC | PRN

## 2022-02-11 MED ORDER — ONDANSETRON HCL 4 MG PO TABS: 4.0000 mg | ORAL_TABLET | Freq: Four times a day (QID) | ORAL | Status: DC | PRN

## 2022-02-11 MED ORDER — SODIUM CHLORIDE 0.9 % IV SOLN: 250.0000 mL | INTRAVENOUS | Status: DC

## 2022-02-11 MED ORDER — SODIUM CHLORIDE 0.9% FLUSH: 3.0000 mL | INTRAVENOUS | Status: DC | PRN

## 2022-02-11 MED ORDER — MENTHOL 3 MG MT LOZG: 1.0000 | LOZENGE | OROMUCOSAL | Status: DC | PRN

## 2022-02-11 MED ORDER — ACETAMINOPHEN 650 MG RE SUPP: 650.0000 mg | RECTAL | Status: DC | PRN

## 2022-02-11 MED ORDER — CITALOPRAM HYDROBROMIDE 20 MG PO TABS: 20.0000 mg | ORAL_TABLET | Freq: Every morning | ORAL | 1 refills | Status: AC

## 2022-02-11 NOTE — Progress Notes (Signed)
Subjective:    Patient ID: Randy [name], male    DOB: [birth year], 81 y.o.   MRN: [id number]  HPI 81 year old male who  has a past medical history of Depression, Diabetes mellitus, Hypertension, NPH (normal pressure hydrocephalus) ([place]), and OSA (obstructive sleep apnea) (11/24/2016).  He presents to the office today with his wife for multiple issues.    He has a history of diabetes and has been well-controlled on metformin 1000 mg daily.  He is out of medication and needs a refill.  He did have his A1c checked during her recent hospital visit less than 2 months ago in which time his A1c was 5.8   He also suffers from depression, currently prescribed Celexa 10 mg daily.  He nor his wife think the medication is working well.  He has also been trialed on Wellbutrin in the past but stopped taking it as he did not notice any improvement.  His wife would like for him to have a referral to a psychiatrist help with medication management.  This generally, he was in the emergency room 2 days ago with confusion, difficulty walking, incontinence.  Having the symptoms 2 weeks ago before he presented to the neurosurgery clinic to have his shunt adjusted by Dr. Venetia Constable.  He was doing much better until he had to go to the emergency room with recurrence of his symptoms.  Advised to follow-up with Dr. Venetia Constable for further adjustment of his shunt.  His wife reports that she has left multiple messages but has not been able to get in touch with the neurosurgery office.  She is going to try again today and if she does not hear anything by phone she will go to the office.  Review of Systems See HPI   Past Medical History:  Diagnosis Date   Depression    2022   Diabetes mellitus    Type II   Hypertension    NPH (normal pressure hydrocephalus) (HCC)    OSA (obstructive sleep apnea) 11/24/2016    Social History   Socioeconomic History   Marital status: Married    Spouse name: Ashok Norris   Number of  children: 3   Years of education: Not on file   Highest education level: Not on file  Occupational History   Occupation: retired    Comment: Engineer, building services  Tobacco Use   Smoking status: Former    Packs/day: 1.00    Years: 30.00    Total pack years: 30.00    Types: Cigarettes    Quit date: 07/03/2001    Years since quitting: 20.6   Smokeless tobacco: Former    Types: Chew    Quit date: 07/04/1991  Vaping Use   Vaping Use: Never used  Substance and Sexual Activity   Alcohol use: Not Currently    Alcohol/week: 18.0 standard drinks of alcohol    Types: 4 Glasses of wine, 14 Shots of liquor per week    Comment: about 2 drinks (wine, beer, or liquor)   Drug use: No   Sexual activity: Yes  Other Topics Concern   Not on file  Social History Narrative   Retired - managed textile plants    Married       He likes to play golf. He likes to read and work in the yard.       Social Determinants of Health   Financial Resource Strain: Low Risk  (03/10/2021)   Overall Financial Resource Strain (CARDIA)    Difficulty  of Paying Living Expenses: Not hard at all  Food Insecurity: No Food Insecurity (03/10/2021)   Hunger Vital Sign    Worried About Running Out of Food in the Last Year: Never true    Ran Out of Food in the Last Year: Never true  Transportation Needs: No Transportation Needs (03/10/2021)   PRAPARE - Hydrologist (Medical): No    Lack of Transportation (Non-Medical): No  Physical Activity: Insufficiently Active (03/10/2021)   Exercise Vital Sign    Days of Exercise per Week: 1 day    Minutes of Exercise per Session: 20 min  Stress: No Stress Concern Present (03/10/2021)   [place]    Feeling of Stress : Not at all  Social Connections: Moderately Isolated (03/10/2021)   Social Connection and Isolation Panel [NHANES]    Frequency of Communication with Friends and Family: Three times a  week    Frequency of Social Gatherings with Friends and Family: Once a week    Attends Religious Services: Never    Marine scientist or Organizations: No    Attends Archivist Meetings: Never    Marital Status: Married  Human resources officer Violence: Not At Risk (03/10/2021)   Humiliation, Afraid, Rape, and Kick questionnaire    Fear of Current or Ex-Partner: No    Emotionally Abused: No    Physically Abused: No    Sexually Abused: No    Past Surgical History:  Procedure Laterality Date   CATARACT EXTRACTION, BILATERAL Bilateral 2019   COLONOSCOPY  07/06/2011   Procedure: COLONOSCOPY;  Surgeon: Winfield Cunas., MD;  Location: [facility] ENDOSCOPY;  Service: Endoscopy;  Laterality: N/A;   ESOPHAGOGASTRODUODENOSCOPY  07/05/2011   Procedure: ESOPHAGOGASTRODUODENOSCOPY (EGD);  Surgeon: Landry Dyke, MD;  Location: [facility] ENDOSCOPY;  Service: Endoscopy;  Laterality: N/A;   EYE SURGERY     FRACTURE SURGERY     right wrist   PLACEMENT OF LUMBAR DRAIN N/A 08/17/2021   Procedure: PLACEMENT OF LUMBAR DRAIN;  Surgeon: Judith Part, MD;  Location: [place];  Service: Neurosurgery;  Laterality: N/A;  3C/RM 19   VENTRICULOPERITONEAL SHUNT Right 12/16/2021   Procedure: Lumboperitoneal shunt placement;  Surgeon: Judith Part, MD;  Location: [place];  Service: Neurosurgery;  Laterality: Right;    Family History  Problem Relation Age of Onset   Brain cancer Mother    Hypertension Father    Diabetes Father    Stroke Father     Allergies  Allergen Reactions   Lipitor [Atorvastatin] Other (See Comments)    Muscle pain    Current Outpatient Medications on File Prior to Visit  Medication Sig Dispense Refill   Blood Glucose Calibration (TRUE METRIX LEVEL 1) Low SOLN USE TO CHECK METER PRIOR TO TESTING SUGAR 3 each 0   Blood Glucose Monitoring Suppl (TRUE METRIX AIR GLUCOSE METER) w/Device KIT 1 each by Does not apply route daily. 1 kit 0   carvedilol (COREG) 6.25 MG tablet Take  1 tablet (6.25 mg total) by mouth 2 (two) times daily with a meal. (Patient taking differently: Take 6.25 mg by mouth 2 (two) times daily as needed (systolic blood pressure >656.).) 180 tablet 1   Cholecalciferol (VITAMIN D-3 PO) Take 1 capsule by mouth in the morning.     clobetasol (OLUX) 0.05 % topical foam Apply topically 2 (two) times daily. 50 g 0   folic acid (FOLVITE) 1 MG tablet Take 1  tablet (1 mg total) by mouth daily. 90 tablet 0   HYDROcodone-acetaminophen (NORCO/VICODIN) 5-325 MG tablet Take 1 tablet by mouth every 4 (four) hours as needed for moderate pain. 30 tablet 0   ibuprofen (ADVIL) 200 MG tablet Take 400 mg by mouth every 8 (eight) hours as needed (for pain.).     lisinopril-hydrochlorothiazide (ZESTORETIC) 20-12.5 MG tablet TAKE ONE TABLET BY MOUTH DAILY 90 tablet 3   Multiple Vitamin (MULTIVITAMIN WITH MINERALS) TABS tablet Take 1 tablet by mouth daily. 90 tablet 1   thiamine 100 MG tablet Take 1 tablet (100 mg total) by mouth daily. 90 tablet 1   TRUE METRIX BLOOD GLUCOSE TEST test strip USE TO TEST BLOOD GLUCOSE TWICE DAILY. 200 each 3   TRUEPLUS LANCETS 33G MISC USE TO CHECK BLOOD SUGAR DAILY AND PRN 300 each 0   vitamin B-12 (CYANOCOBALAMIN) 1000 MCG tablet Take 1,000 mcg by mouth every Monday, Tuesday, Wednesday, Thursday, and Friday.     No current facility-administered medications on file prior to visit.    BP 120/60   Pulse 77   Temp 97.7 F (36.5 C) (Oral)   Ht 5' 8"  (1.727 m)   Wt 167 lb (75.8 kg)   SpO2 98%   BMI 25.39 kg/m       Objective:   Physical Exam Vitals and nursing note reviewed.  Constitutional:      Appearance: Normal appearance. He is ill-appearing (chronically).  Cardiovascular:     Rate and Rhythm: Normal rate and regular rhythm.     Pulses: Normal pulses.     Heart sounds: Normal heart sounds.  Pulmonary:     Effort: Pulmonary effort is normal.     Breath sounds: Normal breath sounds.  Musculoskeletal:        General: Normal  range of motion.  Skin:    General: Skin is warm and dry.  Neurological:     General: No focal deficit present.     Mental Status: He is alert and oriented to person, place, and time.  Psychiatric:        Cognition and Memory: Memory is impaired.     Comments: Mild confusion at times throughout the exam        Assessment & Plan:  1. Depression, recurrent ([place]) - Will increase celexa to see if we can get benefit and refer to psychiatry  [place] Office Visit from 02/11/2022 in [place] at [facility]  PHQ-9 Total Score 21      - citalopram (CELEXA) 20 MG tablet; Take 1 tablet (20 mg total) by mouth in the morning.  Dispense: 90 tablet; Refill: 1 - Ambulatory referral to Psychiatry  2. Type 2 diabetes mellitus with hyperosmolarity without coma, without long-term current use of insulin (HCC)  - metFORMIN (GLUCOPHAGE) 1000 MG tablet; TAKE 1 TABLET BY MOUTH DAILY (1000 MG TOTAL) WITH BREAKFAST  Dispense: 90 tablet; Refill: 1  3. NPH (normal pressure hydrocephalus) ([place]) - Follow up with Neurosurgery, hopefully they can get him in soon and get this shunt adjusted   Dorothyann Peng, NP

## 2022-02-11 NOTE — H&P (Signed)
Neurosurgery H&P  CC: Confusion  HPI: This is a 81 y.o. man that presents with progressive cognitive decline. He is aware of the deficit, it's gotten worse over the past 2 week. He feels like his bladder function is, if anything, improved or at least at baseline, but his gait has worsened as well. No new weakness, numbness, or parasthesias.    ROS: A 14 point ROS was performed and is negative except as noted in the HPI.   PMHx:  Past Medical History:  Diagnosis Date   Depression    2022   Diabetes mellitus    Type II   Hypertension    NPH (normal pressure hydrocephalus) (HCC)    OSA (obstructive sleep apnea) 11/24/2016   FamHx:  Family History  Problem Relation Age of Onset   Brain cancer Mother    Hypertension Father    Diabetes Father    Stroke Father    SocHx:  reports that he quit smoking about 20 years ago. His smoking use included cigarettes. He has a 30.00 pack-year smoking history. He quit smokeless tobacco use about 30 years ago.  His smokeless tobacco use included chew. He reports that he does not currently use alcohol after a past usage of about 18.0 standard drinks of alcohol per week. He reports that he does not use drugs.  Exam: Vital signs in last 24 hours: Temp:  [97.7 F (36.5 C)-98 F (36.7 C)] 98 F (36.7 C) (08/11 2014) Pulse Rate:  [70-77] 70 (08/11 2014) Resp:  [16] 16 (08/11 2014) BP: (120-157)/(59-60) 157/59 (08/11 2014) SpO2:  [97 %-98 %] 97 % (08/11 2014) Weight:  [75.8 kg] 75.8 kg (08/11 1001) General: Awake, alert, cooperative, lying in bed in NAD Head: Normocephalic and atruamatic HEENT: Neck supple Pulmonary: breathing room air comfortably, no evidence of increased work of breathing Cardiac: RRR Abdomen: S NT ND Extremities: Warm and well perfused x4 Neuro: AOx3, PERRL, EOMI, FS Strength 5/5 x4, SILTx4   Assessment and Plan: 81 y.o. man with suspected NPH s/p LP shunt placement with improvement, now with progressive worsening of  cognition and gait but not bladder control. [place] personally reviewed, which shows no acute intracranial abnormalities, no subdural hygroma / hematoma formation.   -CT A/P to evaluate proximal and distal catheter location and pseudocyst -will send labs -diet as tolerated -if the above is not revealing, will try dialing down his shunt -SCDs/TEDs/SQH  Judith Part, MD 02/11/22 10:57 PM [facility]

## 2022-02-12 ENCOUNTER — Encounter (HOSPITAL_COMMUNITY): Payer: Self-pay | Admitting: Neurological Surgery

## 2022-02-12 ENCOUNTER — Observation Stay (HOSPITAL_COMMUNITY): Payer: Medicare PPO

## 2022-02-12 ENCOUNTER — Other Ambulatory Visit: Payer: Self-pay

## 2022-02-12 DIAGNOSIS — N3289 Other specified disorders of bladder: Secondary | ICD-10-CM | POA: Diagnosis not present

## 2022-02-12 DIAGNOSIS — N400 Enlarged prostate without lower urinary tract symptoms: Secondary | ICD-10-CM | POA: Diagnosis not present

## 2022-02-12 LAB — COMPREHENSIVE METABOLIC PANEL
ALT: 15 U/L (ref 0–44)
AST: 11 U/L — ABNORMAL LOW (ref 15–41)
Albumin: 3.4 g/dL — ABNORMAL LOW (ref 3.5–5.0)
Alkaline Phosphatase: 56 U/L (ref 38–126)
Anion gap: 7 (ref 5–15)
BUN: 28 mg/dL — ABNORMAL HIGH (ref 8–23)
CO2: 29 mmol/L (ref 22–32)
Calcium: 10.7 mg/dL — ABNORMAL HIGH (ref 8.9–10.3)
Chloride: 102 mmol/L (ref 98–111)
Creatinine, Ser: 1.34 mg/dL — ABNORMAL HIGH (ref 0.61–1.24)
GFR, Estimated: 53 mL/min — ABNORMAL LOW (ref >60)
Glucose, Bld: 105 mg/dL — ABNORMAL HIGH (ref 70–99)
Potassium: 3.5 mmol/L (ref 3.5–5.1)
Sodium: 138 mmol/L (ref 135–145)
Total Bilirubin: 0.5 mg/dL (ref 0.3–1.2)
Total Protein: 5.7 g/dL — ABNORMAL LOW (ref 6.5–8.1)

## 2022-02-12 LAB — SURGICAL PCR SCREEN
MRSA, PCR: NEGATIVE
Staphylococcus aureus: NEGATIVE

## 2022-02-12 LAB — PROTEIN AND GLUCOSE, CSF
Glucose, CSF: 84 mg/dL — ABNORMAL HIGH (ref 40–70)
Total  Protein, CSF: 48 mg/dL — ABNORMAL HIGH (ref 15–45)

## 2022-02-12 LAB — URINALYSIS, ROUTINE W REFLEX MICROSCOPIC
Bilirubin Urine: NEGATIVE
Glucose, UA: NEGATIVE mg/dL
Hgb urine dipstick: NEGATIVE
Ketones, ur: NEGATIVE mg/dL
Leukocytes,Ua: NEGATIVE
Nitrite: NEGATIVE
Protein, ur: NEGATIVE mg/dL
Specific Gravity, Urine: 1.018 (ref 1.005–1.030)
pH: 6 (ref 5.0–8.0)

## 2022-02-12 LAB — CSF CELL COUNT WITH DIFFERENTIAL
RBC Count, CSF: 280 /mm3 — ABNORMAL HIGH
Tube #: 1
WBC, CSF: 2 /mm3 (ref 0–5)

## 2022-02-12 NOTE — Plan of Care (Signed)

## 2022-02-12 NOTE — Anesthesia Preprocedure Evaluation (Signed)
Anesthesia Evaluation  Patient identified by MRN, date of birth, ID band Patient confused    Reviewed: Allergy & Precautions, NPO status , Patient's Chart, lab work & pertinent test results  Airway Mallampati: II  TM Distance: >3 FB Neck ROM: Full    Dental no notable dental hx. (+) Dental Advisory Given   Pulmonary sleep apnea , former smoker,    Pulmonary exam normal        Cardiovascular hypertension, Pt. on medications Normal cardiovascular exam  2021  ? Nuclear stress EF: 55%. ? There was no ST segment deviation noted during stress. ? This is a low risk study. ? The left ventricular ejection fraction is normal (55-65%).      Neuro/Psych PSYCHIATRIC DISORDERS Depression NPH    GI/Hepatic negative GI ROS, Neg liver ROS,   Endo/Other  diabetes, Type 2, Oral Hypoglycemic Agents  Renal/GU negative Renal ROS     Musculoskeletal negative musculoskeletal ROS (+)   Abdominal   Peds  Hematology negative hematology ROS (+)   Anesthesia Other Findings   Reproductive/Obstetrics                            Anesthesia Physical  Anesthesia Plan  ASA: 3  Anesthesia Plan: General   Post-op Pain Management: Toradol IV (intra-op)*   Induction: Intravenous  PONV Risk Score and Plan: 3 and Ondansetron, Dexamethasone and Diphenhydramine  Airway Management Planned: Oral ETT  Additional Equipment: None  Intra-op Plan:   Post-operative Plan: Extubation in OR  Informed Consent: I have reviewed the patients History and Physical, chart, labs and discussed the procedure including the risks, benefits and alternatives for the proposed anesthesia with the patient or authorized representative who has indicated his/her understanding and acceptance.     Dental advisory given and Consent reviewed with POA  Plan Discussed with: Anesthesiologist and CRNA  Anesthesia Plan Comments:         Anesthesia Quick Evaluation

## 2022-02-12 NOTE — Progress Notes (Signed)
NAE ON, exam stable Discussed with patient's wife, she gave more insight. He has essentially regressed to how he was doing pre-shunt with respect to cognition / incontinence and especially gait. Suspicious for shunt failure. CT A/P shows good position, no pseudocyst. I performed a sterile tap of his shunt this afternoon, was able to get out a few cc, only enough sample for one test - will get gram stain & culture. Presuming those are negative, OR tomorrow for shunt revision. Some scabbing over the R flank incision, if any concerns then will require explant.   -OR tomorrow for LP shunt revision -NPO p MN

## 2022-02-13 ENCOUNTER — Observation Stay (HOSPITAL_BASED_OUTPATIENT_CLINIC_OR_DEPARTMENT_OTHER): Payer: Medicare PPO | Admitting: Anesthesiology

## 2022-02-13 ENCOUNTER — Encounter (HOSPITAL_COMMUNITY)
Admission: AD | Disposition: A | Discharge status: Home / Self Care | Payer: Self-pay | Source: Home / Self Care | Attending: Neurological Surgery

## 2022-02-13 ENCOUNTER — Encounter (HOSPITAL_COMMUNITY): Payer: Self-pay | Admitting: Neurological Surgery

## 2022-02-13 ENCOUNTER — Observation Stay (HOSPITAL_COMMUNITY): Payer: Medicare PPO | Admitting: Anesthesiology

## 2022-02-13 ENCOUNTER — Other Ambulatory Visit: Payer: Self-pay

## 2022-02-13 DIAGNOSIS — G4730 Sleep apnea, unspecified: Secondary | ICD-10-CM | POA: Diagnosis not present

## 2022-02-13 DIAGNOSIS — Z87891 Personal history of nicotine dependence: Secondary | ICD-10-CM

## 2022-02-13 DIAGNOSIS — T85618A Breakdown (mechanical) of other specified internal prosthetic devices, implants and grafts, initial encounter: Secondary | ICD-10-CM | POA: Diagnosis not present

## 2022-02-13 DIAGNOSIS — I10 Essential (primary) hypertension: Secondary | ICD-10-CM | POA: Diagnosis not present

## 2022-02-13 HISTORY — PX: SHUNT REVISION: SHX343

## 2022-02-13 LAB — GLUCOSE, CAPILLARY
Glucose-Capillary: 135 mg/dL — ABNORMAL HIGH (ref 70–99)
Glucose-Capillary: 167 mg/dL — ABNORMAL HIGH (ref 70–99)
Glucose-Capillary: 306 mg/dL — ABNORMAL HIGH (ref 70–99)
Glucose-Capillary: 249 mg/dL — ABNORMAL HIGH (ref 70–99)

## 2022-02-13 LAB — URINE CULTURE: Culture: NO GROWTH

## 2022-02-13 SURGERY — SHUNT REVISION
Anesthesia: General | Laterality: Right

## 2022-02-13 MED ORDER — ROCURONIUM BROMIDE 10 MG/ML (PF) SYRINGE
PREFILLED_SYRINGE | INTRAVENOUS | Status: DC | PRN
  Administered 2022-02-13: 80 mg via INTRAVENOUS

## 2022-02-13 MED ORDER — INSULIN ASPART 100 UNIT/ML IJ SOLN
0.0000 [IU] | Freq: Three times a day (TID) | INTRAMUSCULAR | Status: DC
  Administered 2022-02-13: 7 [IU] via SUBCUTANEOUS
  Administered 2022-02-14: 1 [IU] via SUBCUTANEOUS

## 2022-02-13 MED ORDER — ORAL CARE MOUTH RINSE: 15.0000 mL | Freq: Once | OROMUCOSAL | Status: AC

## 2022-02-13 MED ORDER — PROPOFOL 10 MG/ML IV BOLUS
INTRAVENOUS | Status: DC | PRN
  Administered 2022-02-13: 100 mg via INTRAVENOUS

## 2022-02-13 MED ORDER — SUGAMMADEX SODIUM 200 MG/2ML IV SOLN
INTRAVENOUS | Status: DC | PRN
  Administered 2022-02-13: 200 mg via INTRAVENOUS

## 2022-02-13 MED ORDER — FOLIC ACID 1 MG PO TABS
1.0000 mg | ORAL_TABLET | Freq: Every day | ORAL | Status: DC
  Filled 2022-02-13: qty 1

## 2022-02-13 MED ORDER — PROPOFOL 10 MG/ML IV BOLUS
INTRAVENOUS | Status: AC
  Filled 2022-02-13: qty 20

## 2022-02-13 MED ORDER — HYDROCHLOROTHIAZIDE 12.5 MG PO TABS
12.5000 mg | ORAL_TABLET | Freq: Every day | ORAL | Status: DC
  Filled 2022-02-13: qty 1

## 2022-02-13 MED ORDER — BACITRACIN ZINC 500 UNIT/GM EX OINT
TOPICAL_OINTMENT | CUTANEOUS | Status: AC
Start: 2022-02-13 — End: ?
  Filled 2022-02-13: qty 28.35

## 2022-02-13 MED ORDER — METFORMIN HCL 500 MG PO TABS: 1000.0000 mg | ORAL_TABLET | Freq: Every day | ORAL | Status: DC

## 2022-02-13 MED ORDER — METFORMIN HCL 500 MG PO TABS
1000.0000 mg | ORAL_TABLET | Freq: Every day | ORAL | Status: DC
  Administered 2022-02-13 – 2022-02-15 (×3): 1000 mg via ORAL
  Filled 2022-02-13 (×3): qty 2

## 2022-02-13 MED ORDER — ADULT MULTIVITAMIN W/MINERALS CH
1.0000 | ORAL_TABLET | Freq: Every day | ORAL | Status: DC
  Filled 2022-02-13: qty 1

## 2022-02-13 MED ORDER — LIDOCAINE-EPINEPHRINE 1 %-1:100000 IJ SOLN
INTRAMUSCULAR | Status: AC
  Filled 2022-02-13: qty 1

## 2022-02-13 MED ORDER — EPHEDRINE SULFATE-NACL 50-0.9 MG/10ML-% IV SOSY
PREFILLED_SYRINGE | INTRAVENOUS | Status: DC | PRN
  Administered 2022-02-13 (×3): 5 mg via INTRAVENOUS

## 2022-02-13 MED ORDER — LABETALOL HCL 5 MG/ML IV SOLN
INTRAVENOUS | Status: DC | PRN
  Administered 2022-02-13 (×2): 5 mg via INTRAVENOUS

## 2022-02-13 MED ORDER — THIAMINE HCL 100 MG PO TABS
100.0000 mg | ORAL_TABLET | Freq: Every day | ORAL | Status: DC
  Filled 2022-02-13: qty 1

## 2022-02-13 MED ORDER — PROMETHAZINE HCL 25 MG/ML IJ SOLN: 6.2500 mg | INTRAMUSCULAR | Status: DC | PRN

## 2022-02-13 MED ORDER — INSULIN ASPART 100 UNIT/ML IJ SOLN: 0.0000 [IU] | INTRAMUSCULAR | Status: DC | PRN

## 2022-02-13 MED ORDER — AMISULPRIDE (ANTIEMETIC) 5 MG/2ML IV SOLN: 10.0000 mg | Freq: Once | INTRAVENOUS | Status: DC | PRN

## 2022-02-13 MED ORDER — CARVEDILOL 6.25 MG PO TABS
6.2500 mg | ORAL_TABLET | Freq: Two times a day (BID) | ORAL | Status: DC | PRN
  Administered 2022-02-13: 6.25 mg via ORAL
  Filled 2022-02-13: qty 1

## 2022-02-13 MED ORDER — LISINOPRIL 20 MG PO TABS
20.0000 mg | ORAL_TABLET | Freq: Every day | ORAL | Status: DC
  Filled 2022-02-13: qty 1

## 2022-02-13 MED ORDER — CLOBETASOL PROPIONATE 0.05 % EX CREA
1.0000 | TOPICAL_CREAM | Freq: Two times a day (BID) | CUTANEOUS | Status: DC
Start: 2022-02-13 — End: 2022-02-15
  Administered 2022-02-13 – 2022-02-14 (×2): 1 via TOPICAL
  Filled 2022-02-13: qty 15

## 2022-02-13 MED ORDER — LIDOCAINE 2% (20 MG/ML) 5 ML SYRINGE
INTRAMUSCULAR | Status: AC
  Filled 2022-02-13: qty 5

## 2022-02-13 MED ORDER — CHLORHEXIDINE GLUCONATE 0.12 % MT SOLN
OROMUCOSAL | Status: AC
  Administered 2022-02-13: 15 mL via OROMUCOSAL
  Filled 2022-02-13: qty 15

## 2022-02-13 MED ORDER — ONDANSETRON HCL 4 MG/2ML IJ SOLN
INTRAMUSCULAR | Status: DC | PRN
  Administered 2022-02-13: 4 mg via INTRAVENOUS

## 2022-02-13 MED ORDER — FOLIC ACID 1 MG PO TABS
1.0000 mg | ORAL_TABLET | Freq: Every day | ORAL | Status: DC
  Administered 2022-02-13 – 2022-02-15 (×3): 1 mg via ORAL
  Filled 2022-02-13 (×2): qty 1

## 2022-02-13 MED ORDER — THROMBIN 5000 UNITS EX SOLR
CUTANEOUS | Status: AC
  Filled 2022-02-13: qty 5000

## 2022-02-13 MED ORDER — LISINOPRIL-HYDROCHLOROTHIAZIDE 20-12.5 MG PO TABS: 1.0000 | ORAL_TABLET | Freq: Every day | ORAL | Status: DC

## 2022-02-13 MED ORDER — CHLORHEXIDINE GLUCONATE 0.12 % MT SOLN: 15.0000 mL | Freq: Once | OROMUCOSAL | Status: AC

## 2022-02-13 MED ORDER — CITALOPRAM HYDROBROMIDE 10 MG PO TABS
20.0000 mg | ORAL_TABLET | Freq: Every morning | ORAL | Status: DC
  Administered 2022-02-13 – 2022-02-15 (×3): 20 mg via ORAL
  Filled 2022-02-13 (×2): qty 2

## 2022-02-13 MED ORDER — LIDOCAINE 2% (20 MG/ML) 5 ML SYRINGE
INTRAMUSCULAR | Status: DC | PRN
  Administered 2022-02-13: 100 mg via INTRAVENOUS

## 2022-02-13 MED ORDER — THROMBIN 5000 UNITS EX SOLR
OROMUCOSAL | Status: DC | PRN
  Administered 2022-02-13: 5 mL via TOPICAL

## 2022-02-13 MED ORDER — FENTANYL CITRATE (PF) 100 MCG/2ML IJ SOLN: 25.0000 ug | INTRAMUSCULAR | Status: DC | PRN

## 2022-02-13 MED ORDER — CEFAZOLIN SODIUM-DEXTROSE 2-3 GM-%(50ML) IV SOLR
INTRAVENOUS | Status: DC | PRN
  Administered 2022-02-13: 2 g via INTRAVENOUS

## 2022-02-13 MED ORDER — SODIUM CHLORIDE 0.9 % IV SOLN: INTRAVENOUS | Status: DC

## 2022-02-13 MED ORDER — ADULT MULTIVITAMIN W/MINERALS CH
1.0000 | ORAL_TABLET | Freq: Every day | ORAL | Status: DC
  Administered 2022-02-13 – 2022-02-15 (×3): 1 via ORAL
  Filled 2022-02-13 (×2): qty 1

## 2022-02-13 MED ORDER — LISINOPRIL 20 MG PO TABS
20.0000 mg | ORAL_TABLET | Freq: Every day | ORAL | Status: DC
  Administered 2022-02-13 – 2022-02-15 (×3): 20 mg via ORAL
  Filled 2022-02-13 (×2): qty 1

## 2022-02-13 MED ORDER — FENTANYL CITRATE (PF) 250 MCG/5ML IJ SOLN
INTRAMUSCULAR | Status: DC | PRN
Start: 2022-02-13 — End: 2022-02-13
  Administered 2022-02-13: 50 ug via INTRAVENOUS
  Administered 2022-02-13: 100 ug via INTRAVENOUS

## 2022-02-13 MED ORDER — CEFAZOLIN SODIUM-DEXTROSE 2-4 GM/100ML-% IV SOLN
INTRAVENOUS | Status: AC
  Filled 2022-02-13: qty 100

## 2022-02-13 MED ORDER — DEXAMETHASONE SODIUM PHOSPHATE 10 MG/ML IJ SOLN
INTRAMUSCULAR | Status: DC | PRN
  Administered 2022-02-13: 5 mg via INTRAVENOUS

## 2022-02-13 MED ORDER — CITALOPRAM HYDROBROMIDE 10 MG PO TABS
20.0000 mg | ORAL_TABLET | Freq: Every morning | ORAL | Status: DC
Start: 2022-02-13 — End: 2022-02-13
  Filled 2022-02-13: qty 2

## 2022-02-13 MED ORDER — ROCURONIUM BROMIDE 10 MG/ML (PF) SYRINGE
PREFILLED_SYRINGE | INTRAVENOUS | Status: AC
  Filled 2022-02-13: qty 10

## 2022-02-13 MED ORDER — PHENYLEPHRINE 80 MCG/ML (10ML) SYRINGE FOR IV PUSH (FOR BLOOD PRESSURE SUPPORT)
PREFILLED_SYRINGE | INTRAVENOUS | Status: DC | PRN
  Administered 2022-02-13: 160 ug via INTRAVENOUS

## 2022-02-13 MED ORDER — THIAMINE HCL 100 MG PO TABS
100.0000 mg | ORAL_TABLET | Freq: Every day | ORAL | Status: DC
  Administered 2022-02-13 – 2022-02-15 (×3): 100 mg via ORAL
  Filled 2022-02-13 (×2): qty 1

## 2022-02-13 MED ORDER — HYDROCHLOROTHIAZIDE 12.5 MG PO TABS
12.5000 mg | ORAL_TABLET | Freq: Every day | ORAL | Status: DC
  Administered 2022-02-13 – 2022-02-15 (×3): 12.5 mg via ORAL
  Filled 2022-02-13 (×2): qty 1

## 2022-02-13 MED ORDER — LIDOCAINE-EPINEPHRINE 1 %-1:100000 IJ SOLN
INTRAMUSCULAR | Status: DC | PRN
  Administered 2022-02-13: 10 mL via INTRADERMAL

## 2022-02-13 MED ORDER — VITAMIN B-12 1000 MCG PO TABS
1000.0000 ug | ORAL_TABLET | ORAL | Status: DC
Start: 2022-02-14 — End: 2022-02-15

## 2022-02-13 MED ORDER — FENTANYL CITRATE (PF) 250 MCG/5ML IJ SOLN
INTRAMUSCULAR | Status: AC
  Filled 2022-02-13: qty 5

## 2022-02-13 MED ORDER — 0.9 % SODIUM CHLORIDE (POUR BTL) OPTIME
TOPICAL | Status: DC | PRN
  Administered 2022-02-13: 1000 mL

## 2022-02-13 SURGICAL SUPPLY — 55 items
BAG COUNTER SPONGE SURGICOUNT (BAG) ×3 IMPLANT
BLADE CLIPPER SURG (BLADE) ×2 IMPLANT
BLADE SURG 11 STRL SS (BLADE) ×3 IMPLANT
BOOT SUTURE AID YELLOW STND (SUTURE) ×1 IMPLANT
BUR ACORN 9.0 PRECISION (BURR) ×1 IMPLANT
CANISTER SUCT 3000ML PPV (MISCELLANEOUS) ×2 IMPLANT
CLIP RANEY DISP (INSTRUMENTS) IMPLANT
DERMABOND ADVANCED (GAUZE/BANDAGES/DRESSINGS) ×1
DERMABOND ADVANCED .7 DNX12 (GAUZE/BANDAGES/DRESSINGS) ×1 IMPLANT
DRAPE HALF SHEET 40X57 (DRAPES) ×2 IMPLANT
DRAPE INCISE IOBAN 66X45 STRL (DRAPES) ×2 IMPLANT
DRAPE ORTHO SPLIT 77X108 STRL (DRAPES) ×2
DRAPE SURG ORHT 6 SPLT 77X108 (DRAPES) ×2 IMPLANT
DURAPREP 26ML APPLICATOR (WOUND CARE) ×4 IMPLANT
ELECT REM PT RETURN 9FT ADLT (ELECTROSURGICAL) ×2
ELECTRODE REM PT RTRN 9FT ADLT (ELECTROSURGICAL) ×1 IMPLANT
GAUZE 4X4 16PLY ~~LOC~~+RFID DBL (SPONGE) ×1 IMPLANT
GLOVE ECLIPSE 7.5 STRL STRAW (GLOVE) ×3 IMPLANT
GLOVE EXAM NITRILE XL STR (GLOVE) IMPLANT
GLOVE INDICATOR 7.5 STRL GRN (GLOVE) ×2 IMPLANT
GOWN STRL REUS W/ TWL LRG LVL3 (GOWN DISPOSABLE) ×2 IMPLANT
GOWN STRL REUS W/ TWL XL LVL3 (GOWN DISPOSABLE) IMPLANT
GOWN STRL REUS W/TWL 2XL LVL3 (GOWN DISPOSABLE) IMPLANT
GOWN STRL REUS W/TWL LRG LVL3 (GOWN DISPOSABLE) ×2
GOWN STRL REUS W/TWL XL LVL3 (GOWN DISPOSABLE)
HEMOSTAT POWDER KIT SURGIFOAM (HEMOSTASIS) ×2 IMPLANT
HEMOSTAT SURGICEL 2X14 (HEMOSTASIS) IMPLANT
KIT BASIN OR (CUSTOM PROCEDURE TRAY) ×2 IMPLANT
KIT TURNOVER KIT B (KITS) ×2 IMPLANT
MARKER SKIN DUAL TIP RULER LAB (MISCELLANEOUS) IMPLANT
NDL HYPO 18GX1.5 BLUNT FILL (NEEDLE) IMPLANT
NEEDLE HYPO 18GX1.5 BLUNT FILL (NEEDLE) IMPLANT
NEEDLE HYPO 22GX1.5 SAFETY (NEEDLE) ×2 IMPLANT
NS IRRIG 1000ML POUR BTL (IV SOLUTION) ×2 IMPLANT
PACK LAMINECTOMY NEURO (CUSTOM PROCEDURE TRAY) ×2 IMPLANT
PAD ARMBOARD 7.5X6 YLW CONV (MISCELLANEOUS) ×5 IMPLANT
PASSER CATH 65CM DISP (NEUROSURGERY SUPPLIES) ×1 IMPLANT
SHEATH PERITONEAL INTRO 61 (SHEATH) ×1 IMPLANT
SHUNT STRATA NEURO SYSTEM (Shunt) IMPLANT
SPIKE FLUID TRANSFER (MISCELLANEOUS) ×2 IMPLANT
SPONGE T-LAP 4X18 ~~LOC~~+RFID (SPONGE) ×1 IMPLANT
STAPLER SKIN PROX WIDE 3.9 (STAPLE) ×2 IMPLANT
STRIP CLOSURE SKIN 1/2X4 (GAUZE/BANDAGES/DRESSINGS) IMPLANT
SUT ETHILON 3 0 FSL (SUTURE) IMPLANT
SUT NURALON 4 0 TR CR/8 (SUTURE) IMPLANT
SUT SILK 0 TIES 10X30 (SUTURE) IMPLANT
SUT SILK 3 0 SH 30 (SUTURE) IMPLANT
SUT VIC AB 2-0 CP2 18 (SUTURE) ×2 IMPLANT
SUT VIC AB 3-0 SH 8-18 (SUTURE) ×2 IMPLANT
TOWEL GREEN STERILE (TOWEL DISPOSABLE) ×4 IMPLANT
TOWEL GREEN STERILE FF (TOWEL DISPOSABLE) ×2 IMPLANT
TRAY LUMBAR PUNCTURE AD (SET/KITS/TRAYS/PACK) IMPLANT
TUBE CONNECTING 12X1/4 (SUCTIONS) IMPLANT
UNDERPAD 30X36 HEAVY ABSORB (UNDERPADS AND DIAPERS) ×2 IMPLANT
WATER STERILE IRR 1000ML POUR (IV SOLUTION) ×2 IMPLANT

## 2022-02-13 NOTE — Evaluation (Signed)
Physical Therapy Evaluation Patient Details Name: ARSENIO SCHNORR MRN: [id number] DOB: 02-22-1941 Today's Date: 02/13/2022  History of Present Illness  81 y.o. male admitted on 02/11/22 for lumboparotineal shunt revision on 02/13/22.  Pt with significant PMH ofdepression, DM, HTN, NPH, bil cataract surgery, R wrist surgery, ventriculoperitoneal shunt placement 12/16/21.  Clinical Impression  Pt with close supervision for gait in room with SPC.  Pt mildly confused, however, not sure that this is not just his baseline.  Shuffling gait pattern and mildly unsteady, but compensates well with cane.  Wife went home, so I don't have her perspective on how he looks compared to baseline.   PT to follow acutely for deficits listed below.        Recommendations for follow up therapy are one component of a multi-disciplinary discharge planning process, led by the attending physician.  Recommendations may be updated based on patient status, additional functional criteria and insurance authorization.  Follow Up Recommendations Outpatient PT      Assistance Recommended at Discharge Frequent or constant Supervision/Assistance  Patient can return home with the following  A little help with walking and/or transfers    Equipment Recommendations None recommended by PT  Recommendations for Other Services       Functional Status Assessment Patient has had a recent decline in their functional status and demonstrates the ability to make significant improvements in function in a reasonable and predictable amount of time.     Precautions / Restrictions Precautions Precautions: Fall      Mobility  Bed Mobility               General bed mobility comments: Pt was OOB in the recliner chair    Transfers Overall transfer level: Needs assistance Equipment used: Straight cane Transfers: Sit to/from Stand Sit to Stand: Supervision           General transfer comment: supervision for safety, cues to  stand a moment before walking away from the chair, cane in R hand    Ambulation/Gait Ambulation/Gait assistance: Supervision Gait Distance (Feet): 35 Feet Assistive device: Straight cane Gait Pattern/deviations: Step-through pattern, Shuffle, Staggering left, Staggering right Gait velocity: decreased Gait velocity interpretation: <1.31 ft/sec, indicative of household ambulator   General Gait Details: Pt with mildly staggering gait pattern, slow, shuffles, compensates well with cane for balance.  Stairs            Wheelchair Mobility    Modified Rankin (Stroke Patients Only)       Balance Overall balance assessment: Mild deficits observed, not formally tested                                           Pertinent Vitals/Pain Pain Assessment Pain Assessment: No/denies pain    Home Living Family/patient expects to be discharged to:: Private residence Living Arrangements: Spouse/significant other Available Help at Discharge: Family;Available 24 hours/day Type of Home: House Home Access: Stairs to enter Entrance Stairs-Rails: None Entrance Stairs-Number of Steps: 2   Home Layout: One level Home Equipment: Conservation officer, nature (2 wheels);Shower seat;Cane - single point      Prior Function Prior Level of Function : Independent/Modified Independent             Mobility Comments: stopped driving 3 weeks ago, is retired       Engineer, manufacturing Dominance   Dominant Hand: Right    Extremity/Trunk Assessment  Upper Extremity Assessment Upper Extremity Assessment: Overall WFL for tasks assessed    Lower Extremity Assessment Lower Extremity Assessment: Overall WFL for tasks assessed    Cervical / Trunk Assessment Cervical / Trunk Assessment: Other exceptions Cervical / Trunk Exceptions: forward head  Communication      Cognition Arousal/Alertness: Awake/alert Behavior During Therapy: WFL for tasks assessed/performed Overall Cognitive Status: No  family/caregiver present to determine baseline cognitive functioning                                 General Comments: Pt may have cognitive deficits at baseline.  Mildly confused        General Comments      Exercises     Assessment/Plan    PT Assessment Patient needs continued PT services  PT Problem List Decreased balance;Decreased mobility;Decreased knowledge of use of DME       PT Treatment Interventions DME instruction;Gait training;Stair training;Functional mobility training;Therapeutic activities;Therapeutic exercise;Balance training;Neuromuscular re-education;Cognitive remediation;Patient/family education    PT Goals (Current goals can be found in the Care Plan section)  Acute Rehab PT Goals Patient Stated Goal: to go home tomorrow if he can PT Goal Formulation: With patient Time For Goal Achievement: 02/27/22 Potential to Achieve Goals: Good    Frequency Min 3X/week     Co-evaluation               AM-PAC PT "6 Clicks" Mobility  Outcome Measure Help needed turning from your back to your side while in a flat bed without using bedrails?: A Little Help needed moving from lying on your back to sitting on the side of a flat bed without using bedrails?: A Little Help needed moving to and from a bed to a chair (including a wheelchair)?: A Little Help needed standing up from a chair using your arms (e.g., wheelchair or bedside chair)?: A Little Help needed to walk in hospital room?: A Little Help needed climbing 3-5 steps with a railing? : A Little 6 Click Score: 18    End of Session Equipment Utilized During Treatment: Gait belt Activity Tolerance: Patient tolerated treatment well Patient left: in chair;with call bell/phone within reach;with chair alarm set Nurse Communication: Mobility status PT Visit Diagnosis: Unsteadiness on feet (R26.81);Difficulty in walking, not elsewhere classified (R26.2)    Time: 8676-1950 PT Time Calculation (min)  (ACUTE ONLY): 16 min   Charges:   PT Evaluation $PT Eval Moderate Complexity: 1 Mod          Verdene Lennert, PT, DPT  Acute Rehabilitation Secure chat is best for contact #[phone] office

## 2022-02-13 NOTE — Plan of Care (Signed)

## 2022-02-13 NOTE — Op Note (Signed)
PATIENT: Randy [name]  DAY OF SURGERY: 02/13/22   PRE-OPERATIVE DIAGNOSIS:  Shunt malfunction   POST-OPERATIVE DIAGNOSIS:  Same   PROCEDURE:  Lumboperitoneal shunt revision   SURGEON:  Surgeon(s) and Role:    Judith Part, MD - Primary     ANESTHESIA: ETGA   BRIEF HISTORY: This is an 81 year old man with a h/o NPH who had a dramatic improvement after LP shunt placement who presented with rapid decline and recurrence of symptoms. Shunt tap was unremarkable, pt does have some eschar over the valve site that hasn't healed well. I therefore recommended shunt exploration and revision. This was discussed with the patient as well as risks, benefits, and alternatives and wished to proceed with surgery. We also discussed that he may have to be converted to a VP shunt in the future if the LP shunt is functioning but not improving his symptoms.   OPERATIVE DETAIL: The patient was taken to the operating room, anesthesia was induced by the anesthesia team, and the patient was placed on the OR table in the lateral decubitus position. A formal time out was performed with two patient identifiers and confirmed the operative site. The operative site was marked, hair was clipped with surgical clippers, the area was then prepped and draped in a sterile fashion.   The flank / valve incision was opened and explored. There was a scab over the wound without gross purulence, but atypical for this far out from surgery. The pocket had some clear fluid that looked like CSF in the wound, so out of an abundance of caution I cultured this. But there was a clear fracture in the proximal catheter that was leaking CSF. I therefore disconnected the catheter, confirmed continued good flow, removed that portion, and reconnected it. There was a kink in the catheter where it fractured, so I dissected the proximal catheter to loosen it up and remove the kink as well as secured the valve down to the deep side of the pocket to  prevent movement / re-kinking. I tested the distal catheter with good runoff, the valve refilled well multiple times. I then turned attention to closure.   The wound was copiously irrigated, all instrument and sponge counts were correct, and the incision was then closed in layers. The patient was then returned to anesthesia for emergence. No apparent complications at the completion of the procedure.   EBL:  23m   DRAINS: none   SPECIMENS: cultures x2   TJudith Part MD 02/13/22 10:46 AM

## 2022-02-13 NOTE — Anesthesia Procedure Notes (Signed)
Procedure Name: Intubation Date/Time: 02/13/2022 11:03 AM  Performed by: Reece Agar, CRNAPre-anesthesia Checklist: Patient identified, Emergency Drugs available, Suction available and Patient being monitored Patient Re-evaluated:Patient Re-evaluated prior to induction Oxygen Delivery Method: Circle System Utilized Preoxygenation: Pre-oxygenation with 100% oxygen Induction Type: IV induction Ventilation: Mask ventilation without difficulty Laryngoscope Size: Mac and 4 Grade View: Grade II Tube type: Oral Tube size: 7.5 mm Number of attempts: 1 Airway Equipment and Method: Stylet Placement Confirmation: ETT inserted through vocal cords under direct vision, positive ETCO2 and breath sounds checked- equal and bilateral Secured at: 22 cm Tube secured with: Tape Dental Injury: Teeth and Oropharynx as per pre-operative assessment

## 2022-02-13 NOTE — Plan of Care (Signed)
Problem: Education: Goal: Knowledge of General Education information will improve Description: Including pain rating scale, medication(s)/side effects and non-pharmacologic comfort measures 02/13/2022 1831 by Laure Kidney, RN Outcome: Progressing 02/13/2022 1829 by Laure Kidney, RN Outcome: Progressing   Problem: Health Behavior/Discharge Planning: Goal: Ability to manage health-related needs will improve 02/13/2022 1831 by Laure Kidney, RN Outcome: Progressing 02/13/2022 1829 by Laure Kidney, RN Outcome: Progressing   Problem: Clinical Measurements: Goal: Ability to maintain clinical measurements within normal limits will improve 02/13/2022 1831 by Laure Kidney, RN Outcome: Progressing 02/13/2022 1829 by Laure Kidney, RN Outcome: Progressing Goal: Will remain free from infection 02/13/2022 1831 by Laure Kidney, RN Outcome: Progressing 02/13/2022 1829 by Laure Kidney, RN Outcome: Progressing Goal: Diagnostic test results will improve 02/13/2022 1831 by Laure Kidney, RN Outcome: Progressing 02/13/2022 1829 by Laure Kidney, RN Outcome: Progressing Goal: Respiratory complications will improve 02/13/2022 1831 by Laure Kidney, RN Outcome: Progressing 02/13/2022 1829 by Laure Kidney, RN Outcome: Progressing Goal: Cardiovascular complication will be avoided 02/13/2022 1831 by Laure Kidney, RN Outcome: Progressing 02/13/2022 1829 by Laure Kidney, RN Outcome: Progressing   Problem: Activity: Goal: Risk for activity intolerance will decrease 02/13/2022 1831 by Laure Kidney, RN Outcome: Progressing 02/13/2022 1829 by Laure Kidney, RN Outcome: Progressing   Problem: Nutrition: Goal: Adequate nutrition will be maintained 02/13/2022 1831 by Laure Kidney, RN Outcome: Progressing 02/13/2022 1829 by Laure Kidney, RN Outcome: Progressing   Problem: Coping: Goal: Level of anxiety will decrease 02/13/2022 1831 by Laure Kidney, RN Outcome:  Progressing 02/13/2022 1829 by Laure Kidney, RN Outcome: Progressing   Problem: Elimination: Goal: Will not experience complications related to bowel motility 02/13/2022 1831 by Laure Kidney, RN Outcome: Progressing 02/13/2022 1829 by Laure Kidney, RN Outcome: Progressing Goal: Will not experience complications related to urinary retention 02/13/2022 1831 by Laure Kidney, RN Outcome: Progressing 02/13/2022 1829 by Laure Kidney, RN Outcome: Progressing   Problem: Pain Managment: Goal: General experience of comfort will improve 02/13/2022 1831 by Laure Kidney, RN Outcome: Progressing 02/13/2022 1829 by Laure Kidney, RN Outcome: Progressing   Problem: Safety: Goal: Ability to remain free from injury will improve 02/13/2022 1831 by Laure Kidney, RN Outcome: Progressing 02/13/2022 1829 by Laure Kidney, RN Outcome: Progressing   Problem: Skin Integrity: Goal: Risk for impaired skin integrity will decrease 02/13/2022 1831 by Laure Kidney, RN Outcome: Progressing 02/13/2022 1829 by Laure Kidney, RN Outcome: Progressing   Problem: Education: Goal: Ability to describe self-care measures that may prevent or decrease complications (Diabetes Survival Skills Education) will improve 02/13/2022 1831 by Laure Kidney, RN Outcome: Progressing 02/13/2022 1829 by Laure Kidney, RN Outcome: Progressing Goal: Individualized Educational Video(s) 02/13/2022 1831 by Laure Kidney, RN Outcome: Progressing 02/13/2022 1829 by Laure Kidney, RN Outcome: Progressing   Problem: Coping: Goal: Ability to adjust to condition or change in health will improve 02/13/2022 1831 by Laure Kidney, RN Outcome: Progressing 02/13/2022 1829 by Laure Kidney, RN Outcome: Progressing   Problem: Fluid Volume: Goal: Ability to maintain a balanced intake and output will improve 02/13/2022 1831 by Laure Kidney, RN Outcome: Progressing 02/13/2022 1829 by Laure Kidney, RN Outcome: Progressing    Problem: Health Behavior/Discharge Planning: Goal: Ability to identify and utilize available resources and services will improve 02/13/2022 1831 by Laure Kidney, RN Outcome: Progressing 02/13/2022 1829 by Laure Kidney,  RN Outcome: Progressing Goal: Ability to manage health-related needs will improve 02/13/2022 1831 by Laure Kidney, RN Outcome: Progressing 02/13/2022 1829 by Laure Kidney, RN Outcome: Progressing   Problem: Metabolic: Goal: Ability to maintain appropriate glucose levels will improve 02/13/2022 1831 by Laure Kidney, RN Outcome: Progressing 02/13/2022 1829 by Laure Kidney, RN Outcome: Progressing   Problem: Nutritional: Goal: Maintenance of adequate nutrition will improve 02/13/2022 1831 by Laure Kidney, RN Outcome: Progressing 02/13/2022 1829 by Laure Kidney, RN Outcome: Progressing Goal: Progress toward achieving an optimal weight will improve 02/13/2022 1831 by Laure Kidney, RN Outcome: Progressing 02/13/2022 1829 by Laure Kidney, RN Outcome: Progressing   Problem: Skin Integrity: Goal: Risk for impaired skin integrity will decrease 02/13/2022 1831 by Laure Kidney, RN Outcome: Progressing 02/13/2022 1829 by Laure Kidney, RN Outcome: Progressing   Problem: Tissue Perfusion: Goal: Adequacy of tissue perfusion will improve 02/13/2022 1831 by Laure Kidney, RN Outcome: Progressing 02/13/2022 1829 by Laure Kidney, RN Outcome: Progressing

## 2022-02-13 NOTE — Anesthesia Postprocedure Evaluation (Signed)
Anesthesia Post Note  Patient: Randy [name]  Procedure(s) Performed: RIGHT LUMBOPERITONEAL SHUNT REVISION (Right)     Patient location during evaluation: PACU Anesthesia Type: General Level of consciousness: sedated Pain management: pain level controlled Vital Signs Assessment: post-procedure vital signs reviewed and stable Respiratory status: spontaneous breathing and respiratory function stable Cardiovascular status: stable Postop Assessment: no apparent nausea or vomiting Anesthetic complications: no   No notable events documented.  Last Vitals:  Vitals:   02/13/22 1240 02/13/22 1255  BP: (!) 163/71 (!) 146/66  Pulse: 74   Resp: 17   Temp:    SpO2: 96%     Last Pain:  Vitals:   02/13/22 1255  TempSrc:   PainSc: 0-No pain    LLE Motor Response: Purposeful movement (02/13/22 1255)   RLE Motor Response: Purposeful movement (02/13/22 1255)        Caliyah Sieh DANIEL

## 2022-02-13 NOTE — Transfer of Care (Signed)
Immediate Anesthesia Transfer of Care Note  Patient: Randy [name]  Procedure(s) Performed: RIGHT LUMBOPERITONEAL SHUNT REVISION (Right)  Patient Location: PACU  Anesthesia Type:General  Level of Consciousness: awake and alert   Airway & Oxygen Therapy: Patient Spontanous Breathing and Patient connected to face mask oxygen  Post-op Assessment: Report given to RN and Post -op Vital signs reviewed and stable  Post vital signs: Reviewed and stable  Last Vitals:  Vitals Value Taken Time  BP 167/60 02/13/22 1228  Temp    Pulse 73 02/13/22 1226  Resp 18 02/13/22 1226  SpO2 97 % 02/13/22 1226  Vitals shown include unvalidated device data.  Last Pain:  Vitals:   02/13/22 0936  TempSrc: Oral  PainSc:          Complications: No notable events documented.

## 2022-02-13 NOTE — Progress Notes (Signed)
Neurosurgery Service Post-operative progress note  Assessment & Plan: 81 y.o. man s/p LP shunt revision, seen on 3W, AO and Fcx4, recovering well.  -PT/OT -will monitor for improvement over the next few days, if he has improvement then discharge home, if not then will have to discuss VPS placement  Judith Part  02/13/22 2:50 PM

## 2022-02-13 NOTE — Progress Notes (Signed)
Neurosurgery Service Progress Note  Subjective: No acute events overnight   Objective: Vitals:   02/13/22 0417 02/13/22 0733 02/13/22 0936 02/13/22 0944  BP: (!) 142/54 (!) 118/57 129/62   Pulse: 63 63 61   Resp: '18 19 18   '$ Temp: 98.4 F (36.9 C) 98.6 F (37 C) 97.7 F (36.5 C)   TempSrc: Oral Oral Oral   SpO2: 95% 96% 94%   Weight:    75 kg  Height:    5' 7.99" (1.727 m)    Physical Exam: Strength 5/5 x4 and SILTx4   Assessment & Plan: 81 y.o. man w/ LPS and recurrence of NPH sx suspicious for shunt malfunction, CT A/P with good position, no pseudocyst, 8/12 shunt tap with normal WBCs, gram stain / Cx neg  -OR today for shunt revision  Judith Part  02/13/22 10:45 AM

## 2022-02-14 ENCOUNTER — Encounter (HOSPITAL_COMMUNITY): Payer: Self-pay | Admitting: Neurological Surgery

## 2022-02-14 DIAGNOSIS — Z8249 Family history of ischemic heart disease and other diseases of the circulatory system: Secondary | ICD-10-CM | POA: Diagnosis not present

## 2022-02-14 DIAGNOSIS — E119 Type 2 diabetes mellitus without complications: Secondary | ICD-10-CM | POA: Diagnosis present

## 2022-02-14 DIAGNOSIS — I10 Essential (primary) hypertension: Secondary | ICD-10-CM | POA: Diagnosis present

## 2022-02-14 DIAGNOSIS — Y752 Prosthetic and other implants, materials and neurological devices associated with adverse incidents: Secondary | ICD-10-CM | POA: Diagnosis present

## 2022-02-14 DIAGNOSIS — Z833 Family history of diabetes mellitus: Secondary | ICD-10-CM | POA: Diagnosis not present

## 2022-02-14 DIAGNOSIS — G9340 Encephalopathy, unspecified: Secondary | ICD-10-CM | POA: Diagnosis present

## 2022-02-14 DIAGNOSIS — Z87891 Personal history of nicotine dependence: Secondary | ICD-10-CM | POA: Diagnosis not present

## 2022-02-14 DIAGNOSIS — G4733 Obstructive sleep apnea (adult) (pediatric): Secondary | ICD-10-CM | POA: Diagnosis present

## 2022-02-14 DIAGNOSIS — R410 Disorientation, unspecified: Secondary | ICD-10-CM | POA: Diagnosis present

## 2022-02-14 DIAGNOSIS — G912 (Idiopathic) normal pressure hydrocephalus: Secondary | ICD-10-CM | POA: Diagnosis present

## 2022-02-14 DIAGNOSIS — F32A Depression, unspecified: Secondary | ICD-10-CM | POA: Diagnosis present

## 2022-02-14 DIAGNOSIS — T85615A Breakdown (mechanical) of other nervous system device, implant or graft, initial encounter: Secondary | ICD-10-CM | POA: Diagnosis present

## 2022-02-14 LAB — GLUCOSE, CAPILLARY
Glucose-Capillary: 124 mg/dL — ABNORMAL HIGH (ref 70–99)
Glucose-Capillary: 113 mg/dL — ABNORMAL HIGH (ref 70–99)
Glucose-Capillary: 95 mg/dL (ref 70–99)
Glucose-Capillary: 112 mg/dL — ABNORMAL HIGH (ref 70–99)

## 2022-02-14 NOTE — Progress Notes (Signed)
   Providing Compassionate, Quality Care - Together  NEUROSURGERY PROGRESS NOTE   S: No issues overnight.   O: EXAM:  BP 129/62 (BP Location: Left Arm)   Pulse 65   Temp (!) 97.5 F (36.4 C) (Oral)   Resp 17   Ht 5' 7.99" (1.727 m)   Wt 75 kg   SpO2 97%   BMI 25.14 kg/m   Awake, alert, oriented  PERRL Speech fluent, appropriate  CNs grossly intact  5/5 BUE/BLE Incisions clean dry and intact, no erythema, no fluctuance  ASSESSMENT:  81 y.o. male with   NPH -Status post LP shunt revision, proximal  PLAN: -PT/OT evaluation -Pain control -Pending progression, may need VP shunt or discharge home tomorrow    Thank you for allowing me to participate in this patient's care.  Please do not hesitate to call with questions or concerns.   Elwin Sleight, [facility] Cell: [phone]

## 2022-02-14 NOTE — Evaluation (Signed)
Occupational Therapy Evaluation Patient Details Name: Randy [name] MRN: [id number] DOB: May 30, 1941 Today's Date: 02/14/2022   History of Present Illness 81 y.o. male admitted on 02/11/22 for lumboparotineal shunt revision on 02/13/22.  Pt with significant PMH ofdepression, DM, HTN, NPH, bil cataract surgery, R wrist surgery, ventriculoperitoneal shunt placement 12/16/21.   Clinical Impression   PTA, pt was independent in ADL, and wife assisted with driving and medication management. Pt presenting with decreased balance, coordination, strength, problem solving, attention, and safety awareness. Pt performing UB ADL and LB ADL with set up to min guard A. Pt with decreased problem solving and attention as indicated by difficulty with counting backward from 20 to 1, requiring verbal cues for attention to task and increased time for problem solving. Pt requiring verbal cues for two step commands throughout session. Pt with max difficulty with visual attention during visual pursuits, however, occular ROM WFL. Recommend OP OT upon discharge to optimize safety and independence in ADL and IADL. Will continue to follow acutely.     Recommendations for follow up therapy are one component of a multi-disciplinary discharge planning process, led by the attending physician.  Recommendations may be updated based on patient status, additional functional criteria and insurance authorization.   Follow Up Recommendations  Outpatient OT    Assistance Recommended at Discharge Intermittent Supervision/Assistance  Patient can return home with the following A little help with walking and/or transfers;A little help with bathing/dressing/bathroom;Assistance with cooking/housework;Direct supervision/assist for medications management;Direct supervision/assist for financial management;Assist for transportation;Help with stairs or ramp for entrance    Functional Status Assessment  Patient has had a recent decline in their  functional status and demonstrates the ability to make significant improvements in function in a reasonable and predictable amount of time.  Equipment Recommendations  None recommended by OT (Pt has all recommended equipment)    Recommendations for Other Services       Precautions / Restrictions Precautions Precautions: Fall Restrictions Weight Bearing Restrictions: No      Mobility Bed Mobility               General bed mobility comments: Pt was in recliner on arrival and departure    Transfers Overall transfer level: Needs assistance Equipment used: Quad cane, None Transfers: Sit to/from Stand Sit to Stand: Supervision, Min guard           General transfer comment: supervision for safety, cues to stand a moment before walking away from the chair, cane in R hand      Balance Overall balance assessment: Mild deficits observed, not formally tested                                         ADL either performed or assessed with clinical judgement   ADL Overall ADL's : Needs assistance/impaired Eating/Feeding: Set up;Sitting   Grooming: Wash/dry hands;Min guard;Standing   Upper Body Bathing: Set up;Sitting;Supervision/ safety   Lower Body Bathing: Min guard;Sit to/from stand   Upper Body Dressing : Supervision/safety;Set up;Sitting   Lower Body Dressing: Min guard;Sit to/from stand Lower Body Dressing Details (indicate cue type and reason): Donning socks during session with supervision for safety. Min guard upon stand to don pants Toilet Transfer: Min guard;Comfort height toilet;Ambulation Toilet Transfer Details (indicate cue type and reason): simulated in room     Tub/ Shower Transfer: Walk-in shower;Min guard;Cueing for safety;Shower seat;Ambulation Tub/Shower Transfer Details (indicate  cue type and reason): Min guard A for safety. Cueing for hand placement (pt attempting to grasp hand held shower head for support) Functional mobility  during ADLs: Min guard;Supervision/safety General ADL Comments: Pt limited by atention, sequencing and problem solving. Pt obserevd to carry cane until OT cueing, and allowing pt to ambulate without cane     Vision Baseline Vision/History: 1 Wears glasses Ability to See in Adequate Light: 0 Adequate Patient Visual Report: Blurring of vision;Other (comment) (Pt reportig hard to explain. Upon visual testing, pt with significantly decreased visual attention) Vision Assessment?: Yes Eye Alignment: Within Functional Limits Tracking/Visual Pursuits: Impaired - to be further tested in functional context;Other (comment) (Pt losing stimulus, and requiring verbal cueing to bring attention back to visual testing during visual pursuits. Pt requiring max increased time to return to task.) Visual Fields: No apparent deficits;Other (comment) (Increased time for processing) Depth Perception: Undershoots (very slight undershooting RUE and LUE. Cues for following commands to perform visual testing) Additional Comments: Decreased visual attention and processing speed     Perception     Praxis      Pertinent Vitals/Pain Pain Assessment Pain Assessment: No/denies pain     Hand Dominance Right   Extremity/Trunk Assessment Upper Extremity Assessment Upper Extremity Assessment: Generalized weakness;RUE deficits/detail;LUE deficits/detail RUE Deficits / Details: decreased speed of fine motor coordination. Generalized weakess 4+/5 LUE Deficits / Details: decreased speed of fine motor coordination. Generalized weakess 4+/5   Lower Extremity Assessment Lower Extremity Assessment: Defer to PT evaluation   Cervical / Trunk Assessment Cervical / Trunk Assessment: Other exceptions Cervical / Trunk Exceptions: forward head   Communication Communication Communication: No difficulties   Cognition Arousal/Alertness: Awake/alert Behavior During Therapy: WFL for tasks assessed/performed Overall Cognitive  Status: Impaired/Different from baseline Area of Impairment: Attention, Following commands, Safety/judgement, Problem solving, Awareness, Memory                   Current Attention Level: Sustained Memory: Decreased short-term memory Following Commands: Follows one step commands consistently, Follows one step commands with increased time, Follows multi-step commands with increased time (two step commands) Safety/Judgement: Decreased awareness of safety Awareness: Emergent Problem Solving: Slow processing, Requires verbal cues General Comments: Pt with difficulty with visual attention during visual testing. Pt following all commands with increased time for processing. With emergent awareness, frequently reporting "i'm sorry" when taking additional time to follow commands or when commands have to be repeated. Slightly hard of hearing as well. Pt recalling 3/3 words for delayed memory recall with increased time. Pt taking significantly increased time to count backward from 20 to 1, and requiring mod encouragement to continue/attend to task. Wife reports he seems a little off.     General Comments  Wife present. Reporting he seems a bit off from baseline, agreeable to OP OT    Exercises     Shoulder Instructions      Home Living Family/patient expects to be discharged to:: Private residence Living Arrangements: Spouse/significant other Available Help at Discharge: Family;Available 24 hours/day Type of Home: House Home Access: Stairs to enter [employer] of Steps: 2 Entrance Stairs-Rails: None Home Layout: One level     Bathroom Shower/Tub: Walk-in shower;Other (comment) (also has tub, but reportring uses walk-in shower)   Bathroom Toilet: Handicapped height     Home Equipment: Conservation officer, nature (2 wheels);Shower seat;Cane - single point          Prior Functioning/Environment Prior Level of Function : Independent/Modified Independent  Mobility  Comments: stopped driving 3 weeks ago, is retired; occasional use of cane ADLs Comments: performs ADLs on his own and cooks occasionally, but his wife reports they have a maid, so they do not cook often.  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        OT Problem List: Decreased strength;Decreased activity tolerance;Impaired balance (sitting and/or standing);Impaired vision/perception;Decreased coordination;Decreased cognition;Decreased safety awareness;Decreased knowledge of use of DME or AE      OT Treatment/Interventions: Self-care/ADL training;Therapeutic exercise;DME and/or AE instruction;Therapeutic activities;Cognitive remediation/compensation;Visual/perceptual remediation/compensation;Patient/family education;Balance training    OT Goals(Current goals can be found in the care plan section) Acute Rehab OT Goals Patient Stated Goal: Go home OT Goal Formulation: With patient Time For Goal Achievement: 02/28/22 Potential to Achieve Goals: Good  OT Frequency: Min 2X/week    Co-evaluation              AM-PAC OT "6 Clicks" Daily Activity     Outcome Measure Help from another person eating meals?: None Help from another person taking care of personal grooming?: A Little Help from another person toileting, which includes using toliet, bedpan, or urinal?: A Little Help from another person bathing (including washing, rinsing, drying)?: A Little Help from another person to put on and taking off regular upper body clothing?: A Little Help from another person to put on and taking off regular lower body clothing?: A Little 6 Click Score: 19   End of Session Equipment Utilized During Treatment: Gait belt Nurse Communication: Mobility status  Activity Tolerance: Patient tolerated treatment well Patient left: in chair;with call bell/phone within reach;with family/visitor present  OT Visit Diagnosis: Unsteadiness on feet (R26.81);Muscle  weakness (generalized) (M62.81);Other abnormalities of gait and mobility (R26.89);History of falling (Z91.81);Other symptoms and signs involving cognitive function                Time: 9735-3299 OT Time Calculation (min): 20 min Charges:  OT General Charges $OT Visit: 1 Visit OT Evaluation $OT Eval Moderate Complexity: 1 Mod  Shanda Howells, OTR/L [facility] Acute Rehabilitation Office: [phone]   Lula Olszewski 02/14/2022, 9:49 AM

## 2022-02-14 NOTE — Care Management Obs Status (Signed)
[place] NOTIFICATION   Patient Details  Name: Randy [name] MRN: [id number] Date of Birth: 1941-03-13   Medicare Observation Status Notification Given:  Yes    Pollie Friar, RN 02/14/2022, 11:03 AM

## 2022-02-14 NOTE — Plan of Care (Signed)

## 2022-02-14 NOTE — Progress Notes (Signed)
Physical Therapy Treatment Patient Details Name: Randy [name] MRN: [id number] DOB: 07-11-1940 Today's Date: 02/14/2022   History of Present Illness 81 y.o. male admitted on 02/11/22 for lumboparotineal shunt revision on 02/13/22.  Pt with significant PMH ofdepression, DM, HTN, NPH, bil cataract surgery, R wrist surgery, ventriculoperitoneal shunt placement 12/16/21.    PT Comments    Pt demonstrated significant improvement both cognitively and functionally. Pt with increased stability with ambulation in addition to endurance. Pt negotiated 2 steps with SPC and railing to safely enter home. Wife present and reports he is still a little "fuzzy" but significant better than yesterday. Wife also reports spouse ambulating near baseline. Acute PT to cont to follow.    Recommendations for follow up therapy are one component of a multi-disciplinary discharge planning process, led by the attending physician.  Recommendations may be updated based on patient status, additional functional criteria and insurance authorization.  Follow Up Recommendations  Outpatient PT     Assistance Recommended at Discharge Frequent or constant Supervision/Assistance  Patient can return home with the following A little help with walking and/or transfers   Equipment Recommendations  None recommended by PT    Recommendations for Other Services       Precautions / Restrictions Precautions Precautions: Fall Restrictions Weight Bearing Restrictions: No     Mobility  Bed Mobility               General bed mobility comments: Pt was in recliner on arrival and departure    Transfers Overall transfer level: Needs assistance Equipment used: Straight cane Transfers: Sit to/from Stand Sit to Stand: Supervision           General transfer comment: pt with good use of arm rests for standing and returning to sit    Ambulation/Gait Ambulation/Gait assistance: Supervision Gait Distance (Feet): 150  Feet Assistive device: Straight cane Gait Pattern/deviations: Step-through pattern, Narrow base of support Gait velocity: decreased Gait velocity interpretation: <1.31 ft/sec, indicative of household ambulator   General Gait Details: pt with increased stability and minimally using cane in R hand if at all. Pt with low foot clearance and decreased step length however wife reports this to be near baseline and significantly better than yesterday. pt with no episodes of LOB   Stairs Stairs: Yes Stairs assistance: Min guard Stair Management: One rail Right, With cane Number of Stairs: 2 (x3 trials) General stair comments: pt with step to gait pattern, discussed "Up with the good, down with the bad" sequencing leading up with his stronger leg and leading down with his weaker leg as pt with LOB going downt he steps leading with the L LE   Wheelchair Mobility    Modified Rankin (Stroke Patients Only)       Balance Overall balance assessment: Needs assistance Sitting-balance support: Feet supported, No upper extremity supported Sitting balance-Leahy Scale: Fair     Standing balance support: Single extremity supported Standing balance-Leahy Scale: Fair                              Cognition Arousal/Alertness: Awake/alert Behavior During Therapy: WFL for tasks assessed/performed Overall Cognitive Status: Impaired/Different from baseline Area of Impairment: Safety/judgement, Problem solving, Attention                   Current Attention Level: Sustained (easily distracted, tangential, likes to joke around, wife reports this to be normal) Memory: Decreased short-term memory Following Commands: Follows one  step commands consistently, Follows one step commands with increased time, Follows multi-step commands with increased time (two step commands)   Awareness: Emergent Problem Solving: Slow processing, Requires verbal cues General Comments: pt much improved compared  to yesterday per wife. Pt's spouse reports "he is still a little fuzzy in his memory but way better than last night."        Exercises      General Comments General comments (skin integrity, edema, etc.): VSS, wife present and states pt is near baseline      Pertinent Vitals/Pain Pain Assessment Pain Assessment: No/denies pain    Home Living                          Prior Function            PT Goals (current goals can now be found in the care plan section) Acute Rehab PT Goals Patient Stated Goal: go home today PT Goal Formulation: With patient Time For Goal Achievement: 02/27/22 Potential to Achieve Goals: Good Progress towards PT goals: Progressing toward goals    Frequency    Min 3X/week      PT Plan Current plan remains appropriate    Co-evaluation              AM-PAC PT "6 Clicks" Mobility   Outcome Measure  Help needed turning from your back to your side while in a flat bed without using bedrails?: A Little Help needed moving from lying on your back to sitting on the side of a flat bed without using bedrails?: A Little Help needed moving to and from a bed to a chair (including a wheelchair)?: A Little Help needed standing up from a chair using your arms (e.g., wheelchair or bedside chair)?: A Little Help needed to walk in hospital room?: A Little Help needed climbing 3-5 steps with a railing? : A Little 6 Click Score: 18    End of Session Equipment Utilized During Treatment: Gait belt Activity Tolerance: Patient tolerated treatment well Patient left: in chair;with call bell/phone within reach;with chair alarm set Nurse Communication: Mobility status PT Visit Diagnosis: Unsteadiness on feet (R26.81);Difficulty in walking, not elsewhere classified (R26.2)     Time: 5409-8119 PT Time Calculation (min) (ACUTE ONLY): 16 min  Charges:  $Gait Training: 8-22 mins                     Kittie Plater, PT, DPT Acute Rehabilitation  Services Secure chat preferred Office #: [phone]    Berline Lopes 02/14/2022, 1:14 PM

## 2022-02-14 NOTE — TOC Transition Note (Signed)
Transition of Care [facility]) - CM/SW Discharge Note   Patient Details  Name: Randy [name] MRN: [id number] Date of Birth: 11/03/1940  Transition of Care [facility]) CM/SW Contact:  Pollie Friar, RN Phone Number: 02/14/2022, 11:06 AM   Clinical Narrative:    Patient is from home with his spouse. His spouse is able to provide needed supervision.  Pts wife fills his pill box and reminds him to take his medications. She also does all the driving.  DME at home; walker/ shower seat/ cane Recommendations for outpatient therapy. They prefer to attend at [place]. Orders in Epic and information on the AVS. Wife will provide transport home.    Final next level of care: OP Rehab Barriers to Discharge: No Barriers Identified   Patient Goals and CMS Choice     Choice offered to / list presented to : Patient  Discharge Placement                       Discharge Plan and Services                                     Social Determinants of Health (SDOH) Interventions     Readmission Risk Interventions     No data to display

## 2022-02-15 LAB — GLUCOSE, CAPILLARY
Glucose-Capillary: 117 mg/dL — ABNORMAL HIGH (ref 70–99)
Glucose-Capillary: 156 mg/dL — ABNORMAL HIGH (ref 70–99)

## 2022-02-15 NOTE — Progress Notes (Signed)
Explained discharge instructions to wife and patient. Reviewed followup appointments and next med administration times. Both verbalized having an understanding. Removed PIV. All belongings are in the patient's possession to include his glasses, jewelry, and cane.  Transported safely down for discharge.

## 2022-02-15 NOTE — Progress Notes (Signed)
Occupational Therapy Treatment Patient Details Name: Randy [name] MRN: [id number] DOB: 1940-08-28 Today's Date: 02/15/2022   History of present illness 81 y.o. male admitted on 02/11/22 for lumboparotineal shunt revision on 02/13/22.  Pt with significant PMH ofdepression, DM, HTN, NPH, bil cataract surgery, R wrist surgery, ventriculoperitoneal shunt placement 12/16/21.   OT comments  Pt progressing towards established OT goals. Focusing session on cognition and visual attention with pill box test. Pt requiring increased time to read all pill boxes aloud. Pt requiring up to max verbal cues for following 2 step directions, and min cues for one step written directions. Pt with skill to answer scenario-based question for when he would take medication with min cues. Pt presenting with decreased visual attention, organization, memory, and problem solving. Pt and wife agreeable to OP OT upon discharge.    Recommendations for follow up therapy are one component of a multi-disciplinary discharge planning process, led by the attending physician.  Recommendations may be updated based on patient status, additional functional criteria and insurance authorization.    Follow Up Recommendations  Outpatient OT    Assistance Recommended at Discharge Intermittent Supervision/Assistance  Patient can return home with the following  A little help with walking and/or transfers;A little help with bathing/dressing/bathroom;Assistance with cooking/housework;Direct supervision/assist for medications management;Direct supervision/assist for financial management;Assist for transportation;Help with stairs or ramp for entrance   Equipment Recommendations  None recommended by OT (Pt has all recommended equipment)    Recommendations for Other Services      Precautions / Restrictions Precautions Precautions: Fall Restrictions Weight Bearing Restrictions: No       Mobility Bed Mobility               General  bed mobility comments: Pt in recliner on arrival and departure    Transfers Overall transfer level: Needs assistance Equipment used: None Transfers: Sit to/from Stand Sit to Stand: Supervision           General transfer comment: good use of arm rests, no posterior bias     Balance Overall balance assessment: Needs assistance Sitting-balance support: Feet supported, No upper extremity supported Sitting balance-Leahy Scale: Good                                     ADL either performed or assessed with clinical judgement   ADL Overall ADL's : Needs assistance/impaired                       Lower Body Dressing Details (indicate cue type and reason): Pt and wife reporting pt got dressed with PT with no physical A             Functional mobility during ADLs: Min guard;Supervision/safety General ADL Comments: pt limited by attention, STM,  and problem solving. Focus session on cognition and IADL    Extremity/Trunk Assessment Upper Extremity Assessment Upper Extremity Assessment: Generalized weakness;RUE deficits/detail;LUE deficits/detail RUE Deficits / Details: decreased speed of fine motor coordination. Generalized weakess 4+/5 LUE Deficits / Details: decreased speed of fine motor coordination. Generalized weakess 4+/5   Lower Extremity Assessment Lower Extremity Assessment: Defer to PT evaluation        Vision   Vision Assessment?: Yes Eye Alignment: Within Functional Limits Additional Comments: Increased time for reading labels on medication bottles, and frequent undershooting to place items into pill boxes   Perception Perception Perception: Not tested  Praxis Praxis Praxis: Not tested    Cognition Arousal/Alertness: Awake/alert Behavior During Therapy: WFL for tasks assessed/performed Overall Cognitive Status: Impaired/Different from baseline Area of Impairment: Safety/judgement, Problem solving, Attention, Memory, Following  commands, Awareness                   Current Attention Level: Sustained Memory: Decreased short-term memory Following Commands: Follows one step commands consistently, Follows one step commands with increased time, Follows multi-step commands with increased time Safety/Judgement: Decreased awareness of safety Awareness: Emergent Problem Solving: Slow processing, Requires verbal cues General Comments: Performing pill box test this date for continued asessment of cognition. Pt reading all labels aloud with increased time. Noting greater visual attention today than yesterday. Pt requiring up to max verbal cues for problem solving during pill box test on labels with >1 direction. Pt reporting "this is not easy" upon beginning to organize pill box. Performing first instruction with supervision and no verbal cues (take once daily), and requiring from min-max A for all additional labels. Taking >12 minutes to perform pill box test this date.        Exercises      Shoulder Instructions       General Comments vss, wife present    Pertinent Vitals/ Pain       Pain Assessment Pain Assessment: No/denies pain  Home Living                                          Prior Functioning/Environment              Frequency  Min 2X/week        Progress Toward Goals  OT Goals(current goals can now be found in the care plan section)  Progress towards OT goals: Progressing toward goals  Acute Rehab OT Goals Patient Stated Goal: Go home OT Goal Formulation: With patient Time For Goal Achievement: 02/28/22 Potential to Achieve Goals: Good ADL Goals Pt Will Perform Lower Body Dressing: with modified independence;sit to/from stand Pt Will Transfer to Toilet: with modified independence;ambulating;regular height toilet Pt Will Perform Tub/Shower Transfer: Shower transfer;ambulating;shower seat;with modified independence Additional ADL Goal #1: Pt will follow three  step command with no verbal cues. Additional ADL Goal #2: Pt will demonstrate visual attention during visual scanning/tracking tasks with 1-2 cues  Plan Discharge plan remains appropriate    Co-evaluation                 AM-PAC OT "6 Clicks" Daily Activity     Outcome Measure   Help from another person eating meals?: None Help from another person taking care of personal grooming?: A Little Help from another person toileting, which includes using toliet, bedpan, or urinal?: A Little Help from another person bathing (including washing, rinsing, drying)?: A Little Help from another person to put on and taking off regular upper body clothing?: A Little Help from another person to put on and taking off regular lower body clothing?: A Little 6 Click Score: 19    End of Session    OT Visit Diagnosis: Unsteadiness on feet (R26.81);Muscle weakness (generalized) (M62.81);Other abnormalities of gait and mobility (R26.89);History of falling (Z91.81);Other symptoms and signs involving cognitive function   Activity Tolerance Patient tolerated treatment well   Patient Left in chair;with call bell/phone within reach;with family/visitor present   Nurse Communication Mobility status        Time:  1252-4799 OT Time Calculation (min): 21 min  Charges: OT General Charges $OT Visit: 1 Visit OT Treatments $Self Care/Home Management : 8-22 mins  Shanda Howells, OTR/L [facility] Acute Rehabilitation Office: [phone]   AWNOPW K HIPRKSY 02/15/2022, 12:30 PM

## 2022-02-15 NOTE — Progress Notes (Signed)
Physical Therapy Treatment Patient Details Name: JAVOHN BASEY MRN: [id number] DOB: December 12, 1940 Today's Date: 02/15/2022   History of Present Illness 81 y.o. male admitted on 02/11/22 for lumboparotineal shunt revision on 02/13/22.  Pt with significant PMH ofdepression, DM, HTN, NPH, bil cataract surgery, R wrist surgery, ventriculoperitoneal shunt placement 12/16/21.    PT Comments    Pt progressing well both cognitively and functionally. Pt able to amb 200' without AD today with short step length and low foot clearance. Wife reports this to be baseline. Pt was able to dress self including socks and shoes with verbal cues to do majority in sitting as pt with impaired sing limb balance to do in standing. Wife present and agrees. Pt remains to have delayed processing however pt also HOH. Acute pt to cont to follow.    Recommendations for follow up therapy are one component of a multi-disciplinary discharge planning process, led by the attending physician.  Recommendations may be updated based on patient status, additional functional criteria and insurance authorization.  Follow Up Recommendations  Outpatient PT     Assistance Recommended at Discharge Frequent or constant Supervision/Assistance  Patient can return home with the following A little help with walking and/or transfers   Equipment Recommendations  None recommended by PT    Recommendations for Other Services       Precautions / Restrictions Precautions Precautions: Fall Restrictions Weight Bearing Restrictions: No     Mobility  Bed Mobility               General bed mobility comments: Pt was in recliner on arrival and departure    Transfers Overall transfer level: Needs assistance Equipment used: None Transfers: Sit to/from Stand Sit to Stand: Supervision           General transfer comment: good use of arm rests, no posterior bias    Ambulation/Gait Ambulation/Gait assistance: Supervision Gait  Distance (Feet): 200 Feet Assistive device: None Gait Pattern/deviations: Step-through pattern, Narrow base of support Gait velocity: decreased     General Gait Details: pt with improved stability and no longer needed his SPC, one small episode of LOB via scuffing R shoe however required no assist to prevent fall or maintain balance   Stairs Stairs: Yes Stairs assistance: Min guard Stair Management: One rail Right Number of Stairs: 2 (x4) General stair comments: pt able to complete ascend/descend reciprocally with bilat HR, pt step to with R HR only to mimic home set up   Wheelchair Mobility    Modified Rankin (Stroke Patients Only)       Balance Overall balance assessment: Needs assistance Sitting-balance support: Feet supported, No upper extremity supported Sitting balance-Leahy Scale: Good Sitting balance - Comments: able to don underwear, shorts, socks, shoes without LOB while on edge of chair   Standing balance support: Single extremity supported Standing balance-Leahy Scale: Fair Standing balance comment: able to pull up shorts and underwears, attempted to stand and don pants however unsteady and had to hold onto bed rail, encouraged pt to sit to don Lower body articles of clothes                            Cognition Arousal/Alertness: Awake/alert Behavior During Therapy: WFL for tasks assessed/performed Overall Cognitive Status: Impaired/Different from baseline Area of Impairment: Safety/judgement, Problem solving, Attention                   Current Attention Level: Sustained (easily distracted,  tangential, likes to joke around, wife reports this to be normal) Memory: Decreased short-term memory Following Commands: Follows one step commands consistently, Follows one step commands with increased time, Follows multi-step commands with increased time (two step commands) Safety/Judgement: Decreased awareness of safety Awareness: Emergent Problem  Solving: Slow processing, Requires verbal cues General Comments: spouse reports pt with continued difficulty comprehending requiring more explanation, pt with noted delayed processing, pt also HOH which could contribute to delayed processing        Exercises      General Comments General comments (skin integrity, edema, etc.): VSS, wife present      Pertinent Vitals/Pain Pain Assessment Pain Assessment: No/denies pain    Home Living                          Prior Function            PT Goals (current goals can now be found in the care plan section) Acute Rehab PT Goals Patient Stated Goal: go home today PT Goal Formulation: With patient Time For Goal Achievement: 02/27/22 Potential to Achieve Goals: Good Progress towards PT goals: Progressing toward goals    Frequency    Min 3X/week      PT Plan Current plan remains appropriate    Co-evaluation              AM-PAC PT "6 Clicks" Mobility   Outcome Measure  Help needed turning from your back to your side while in a flat bed without using bedrails?: A Little Help needed moving from lying on your back to sitting on the side of a flat bed without using bedrails?: A Little Help needed moving to and from a bed to a chair (including a wheelchair)?: A Little Help needed standing up from a chair using your arms (e.g., wheelchair or bedside chair)?: A Little Help needed to walk in hospital room?: A Little Help needed climbing 3-5 steps with a railing? : A Little 6 Click Score: 18    End of Session Equipment Utilized During Treatment: Gait belt Activity Tolerance: Patient tolerated treatment well Patient left: in chair;with call bell/phone within reach;with chair alarm set Nurse Communication: Mobility status PT Visit Diagnosis: Unsteadiness on feet (R26.81);Difficulty in walking, not elsewhere classified (R26.2)     Time: 0826-0900 PT Time Calculation (min) (ACUTE ONLY): 34 min  Charges:  $Gait  Training: 8-22 mins $Therapeutic Activity: 8-22 mins                     Kittie Plater, PT, DPT Acute Rehabilitation Services Secure chat preferred Office #: [phone]    Berline Lopes 02/15/2022, 11:11 AM

## 2022-02-15 NOTE — TOC Transition Note (Signed)
Transition of Care [facility]) - CM/SW Discharge Note   Patient Details  Name: Randy [name] MRN: [id number] Date of Birth: 10-13-1940  [facility]) CM/SW Contact:  Pollie Friar, RN Phone Number: 02/15/2022, 11:32 AM   Clinical Narrative:    Pt is discharging home with outpatient therapy at [facility]. Information on the AVS.  No changes to patients medications.  Pt has transportation home.    Final next level of care: OP Rehab Barriers to Discharge: No Barriers Identified   Patient Goals and CMS Choice     Choice offered to / list presented to : Spouse  Discharge Placement                       Discharge Plan and Services                                     Social Determinants of Health (SDOH) Interventions     Readmission Risk Interventions     No data to display

## 2022-02-15 NOTE — Discharge Summary (Signed)
Physician Discharge Summary  Patient ID: LUE DUBUQUE MRN: [id number] DOB/AGE: 81-Jan-1942 81 y.o.  Admit date: 02/11/2022 Discharge date: 02/15/2022  Admission Diagnoses:  Lumboperitoneal shunt malfunction Normal pressure hydrocephalus  Discharge Diagnoses:  Same Principal Problem:   Encephalopathy   Discharged Condition: Stable  Hospital Course:  ERRIK [name] is a 81 y.o. male presented with returning of his symptoms of normal pressure hydrocephalus, imaging revealed no abnormal placement of his lumboperitoneal shunt system.  He underwent exploration by Dr. Zada Finders on 02/13/2022 in which she was found to have a kinked and fractured catheter proximally which was repaired.  Postoperatively he tolerated the surgery well, was monitored and evaluated by OT and PT.  He was returning back to his improved baseline similar to when he initially had the shunt placed, primarily in his ambulation.  His incisions were healing appropriately, his pain was controlled on oral medication.  He was having normal bowel bladder function.  Treatments: Surgery -proximal revision, lumboperitoneal shunt  Discharge Exam: Blood pressure 137/68, pulse 66, temperature 98.7 F (37.1 C), temperature source Oral, resp. rate 17, height 5' 7.99" (1.727 m), weight 75 kg, SpO2 96 %. Awake, alert, oriented x3 PERRLA Speech fluent, appropriate CN grossly intact 5/5 BUE/BLE Wounds c/d/I Abdomen soft, nontender  Disposition: Discharge disposition: 01-Home or Self Care       Discharge Instructions     Ambulatory referral to Occupational Therapy   Complete by: As directed    Ambulatory referral to Physical Therapy   Complete by: As directed       Allergies as of 02/15/2022       Reactions   Lipitor [atorvastatin] Other (See Comments)   Muscle pain        Medication List     TAKE these medications    carvedilol 6.25 MG tablet Commonly known as: COREG Take 1 tablet (6.25 mg total) by  mouth 2 (two) times daily with a meal. What changed:  when to take this reasons to take this   citalopram 20 MG tablet Commonly known as: CELEXA Take 1 tablet (20 mg total) by mouth in the morning.   clobetasol 0.05 % topical foam Commonly known as: OLUX Apply topically 2 (two) times daily.   cyanocobalamin 1000 MCG tablet Commonly known as: VITAMIN B12 Take 1,000 mcg by mouth See admin instructions. Take one tablet by mouth on Monday, Tuesday, Wednesday, Thursdays and Fridays only   folic acid 1 MG tablet Commonly known as: FOLVITE Take 1 tablet (1 mg total) by mouth daily.   ibuprofen 200 MG tablet Commonly known as: ADVIL Take 400 mg by mouth every 8 (eight) hours as needed (for pain.).   lisinopril-hydrochlorothiazide 20-12.5 MG tablet Commonly known as: ZESTORETIC TAKE ONE TABLET BY MOUTH DAILY   metFORMIN 1000 MG tablet Commonly known as: GLUCOPHAGE TAKE 1 TABLET BY MOUTH DAILY (1000 MG TOTAL) WITH BREAKFAST What changed:  how much to take how to take this when to take this additional instructions   multivitamin with minerals Tabs tablet Take 1 tablet by mouth daily.   thiamine 100 MG tablet Commonly known as: VITAMIN B1 Take 1 tablet (100 mg total) by mouth daily.   True Metrix Air Glucose Meter w/Device Kit 1 each by Does not apply route daily.   True Metrix Blood Glucose Test test strip Generic drug: glucose blood USE TO TEST BLOOD GLUCOSE TWICE DAILY.   True Metrix Level 1 Low Soln USE TO CHECK METER PRIOR TO TESTING SUGAR   TRUEplus  Lancets 33G Misc USE TO CHECK BLOOD SUGAR DAILY AND PRN   VITAMIN D-3 PO Take 1 capsule by mouth in the morning.        Follow-up Information     [facility]. Schedule an appointment as soon as possible for a visit in 1 week(s).   Specialty: Rehabilitation Contact information: [place] [address] [place], [place] [id number] [place] [phone]         Judith Part, MD Follow up in 3 week(s).   Specialty: Neurosurgery Contact information: [place] [address] [phone]                 Signed: Theodoro Doing Haleigh Desmith 02/15/2022, 11:13 AM

## 2022-02-16 ENCOUNTER — Other Ambulatory Visit: Payer: Self-pay | Admitting: *Deleted

## 2022-02-16 LAB — CSF CULTURE W GRAM STAIN: Culture: NO GROWTH

## 2022-02-16 NOTE — Patient Outreach (Signed)
  Care Coordination [facility] Note Transition Care Management Follow-up Telephone Call Date of discharge and from where: 02/15/22 [facility] How have you been since you were released from the hospital? Wife states the patient is doing well and is improving. Any questions or concerns? No  Items Reviewed: Did the pt receive and understand the discharge instructions provided? Yes  Medications obtained and verified? Yes  Other? No  Any new allergies since your discharge? No  Dietary orders reviewed? Yes Do you have support at home? Yes   Home Care and Equipment/Supplies: Were home health services ordered? no If so, what is the name of the agency? N/A  Has the agency set up a time to come to the patient's home? not applicable Were any new equipment or medical supplies ordered?  No What is the name of the medical supply agency? N/A Were you able to get the supplies/equipment? not applicable Do you have any questions related to the use of the equipment or supplies? No  Functional Questionnaire: (I = Independent and D = Dependent) ADLs: D  Bathing/Dressing- D  Meal Prep- D  Eating- I  Maintaining continence- I  Transferring/Ambulation- D  Managing Meds- D  Follow up appointments reviewed:  PCP Hospital f/u appt confirmed? No   Specialist Hospital f/u appt confirmed? Yes , wife reported the appointments that she is in the process of making with Neurology Rehab and Dr. Zada Finders Are transportation arrangements needed? No  If their condition worsens, is the pt aware to call PCP or go to the Emergency Dept.? Yes Was the patient provided with contact information for the PCP's office or ED? Yes Was to pt encouraged to call back with questions or concerns? Yes  SDOH assessments and interventions completed:   Yes  Care Coordination Interventions Activated:  No   Care Coordination Interventions:   N/A     Encounter Outcome:  Pt. Visit Completed    Emelia Loron RN, BSN [place] [phone] [email or web address]

## 2022-02-18 LAB — AEROBIC/ANAEROBIC CULTURE W GRAM STAIN (SURGICAL/DEEP WOUND)

## 2022-02-20 ENCOUNTER — Other Ambulatory Visit: Payer: Self-pay | Admitting: Adult Health

## 2022-02-25 ENCOUNTER — Ambulatory Visit: Payer: Medicare PPO | Attending: Neurological Surgery | Admitting: Occupational Therapy

## 2022-02-25 ENCOUNTER — Ambulatory Visit: Payer: Medicare PPO

## 2022-02-25 ENCOUNTER — Other Ambulatory Visit: Payer: Self-pay

## 2022-02-25 DIAGNOSIS — R2681 Unsteadiness on feet: Secondary | ICD-10-CM | POA: Diagnosis not present

## 2022-02-25 DIAGNOSIS — R2689 Other abnormalities of gait and mobility: Secondary | ICD-10-CM | POA: Diagnosis not present

## 2022-02-25 DIAGNOSIS — R278 Other lack of coordination: Secondary | ICD-10-CM | POA: Insufficient documentation

## 2022-02-25 DIAGNOSIS — R262 Difficulty in walking, not elsewhere classified: Secondary | ICD-10-CM

## 2022-02-25 DIAGNOSIS — M6281 Muscle weakness (generalized): Secondary | ICD-10-CM | POA: Diagnosis not present

## 2022-02-25 DIAGNOSIS — G4733 Obstructive sleep apnea (adult) (pediatric): Secondary | ICD-10-CM

## 2022-02-25 DIAGNOSIS — R41840 Attention and concentration deficit: Secondary | ICD-10-CM | POA: Diagnosis not present

## 2022-02-25 DIAGNOSIS — G4734 Idiopathic sleep related nonobstructive alveolar hypoventilation: Secondary | ICD-10-CM

## 2022-02-25 NOTE — Therapy (Signed)
OUTPATIENT PHYSICAL THERAPY NEURO EVALUATION   Patient Name: Randy [name] MRN: [id number] DOB:03/08/1941, 81 y.o., male Today's Date: 02/25/2022   PCP: Dorothyann Peng, NP REFERRING PROVIDER: Judith Part, MD   PT End of Session - 02/25/22 0849     Visit Number 1    Number of Visits 4    Date for PT Re-Evaluation 04/01/22    Authorization Type [employer]    PT Start Time 0845    PT Stop Time 0930    PT Time Calculation (min) 45 min    Activity Tolerance Patient tolerated treatment well    Behavior During Therapy [facility] for tasks assessed/performed             Past Medical History:  Diagnosis Date   Depression    2022   Diabetes mellitus    Type II   Hypertension    NPH (normal pressure hydrocephalus) (HCC)    OSA (obstructive sleep apnea) 11/24/2016   Past Surgical History:  Procedure Laterality Date   CATARACT EXTRACTION, BILATERAL Bilateral 2019   COLONOSCOPY  07/06/2011   Procedure: COLONOSCOPY;  Surgeon: Winfield Cunas., MD;  Location: [facility] ENDOSCOPY;  Service: Endoscopy;  Laterality: N/A;   ESOPHAGOGASTRODUODENOSCOPY  07/05/2011   Procedure: ESOPHAGOGASTRODUODENOSCOPY (EGD);  Surgeon: Landry Dyke, MD;  Location: [facility] ENDOSCOPY;  Service: Endoscopy;  Laterality: N/A;   EYE SURGERY     FRACTURE SURGERY     right wrist   PLACEMENT OF LUMBAR DRAIN N/A 08/17/2021   Procedure: PLACEMENT OF LUMBAR DRAIN;  Surgeon: Judith Part, MD;  [facility];  Service: Neurosurgery;  Laterality: N/A;  3C/RM 19   SHUNT REVISION Right 02/13/2022   Procedure: RIGHT LUMBOPERITONEAL SHUNT REVISION;  Surgeon: Judith Part, MD;  Location: [place];  Service: Neurosurgery;  Laterality: Right;   VENTRICULOPERITONEAL SHUNT Right 12/16/2021   Procedure: Lumboperitoneal shunt placement;  Surgeon: Judith Part, MD;  Location: [place];  Service: Neurosurgery;  Laterality: Right;   Patient Active Problem List   Diagnosis Date Noted   NPH (normal pressure  hydrocephalus) (HCC) 12/16/2021   Normal pressure hydrocephalus ([place]) 08/17/2021   Altered mental status    Encephalopathy 07/29/2021   Dyspnea on exertion 03/18/2020   PVC (premature ventricular contraction) 03/18/2020   Acquired hallux limitus of both feet 06/12/2019   Sleep related hypoxia 01/09/2017   Periodic limb movements of sleep 01/09/2017   OSA (obstructive sleep apnea) 11/24/2016   Upper GI bleed 07/06/2011   GI bleeding 07/05/2011   Anemia associated with acute blood loss 07/05/2011   DM (diabetes mellitus), type 2 ([place]) 07/05/2011   Diabetes mellitus with coincident hypertension ([place]) 07/05/2011   Hyperlipidemia 07/05/2011    ONSET DATE: 02/11/22  REFERRING DIAG: G93.40 (ICD-10-CM) - Encephalopathy   THERAPY DIAG:  Muscle weakness (generalized)  Difficulty in walking, not elsewhere classified  Unsteadiness on feet  Rationale for Evaluation and Treatment Rehabilitation  SUBJECTIVE:  SUBJECTIVE STATEMENT: Shunt placement for hydrocephalus. Notes issues with balance, walking, and overall weakness Pt accompanied by: self  PERTINENT HISTORY: recent shunt placement and revision  PAIN:  Are you having pain? No, some headaches  PRECAUTIONS: Fall  WEIGHT BEARING RESTRICTIONS No  FALLS: Has patient fallen in last 6 months? Yes. Number of falls 1  LIVING ENVIRONMENT: Lives with: lives with their spouse Lives in: House/apartment Stairs: Yes: Internal: 12 steps; can reach both and External: 3-4, 2 steps; on right going up Has following equipment at home: Single point cane, has ground-floor set-up  PLOF: Independent  PATIENT GOALS "stamina is lacking"  OBJECTIVE:   DIAGNOSTIC FINDINGS:   COGNITION: Overall cognitive status:  Impaired   SENSATION: WFL  COORDINATION: WNL: rapid, alternating foot/hand tapping. Heel to shin, finger to nose    MUSCLE TONE: WNL     POSTURE: rounded shoulders and forward head  LOWER EXTREMITY ROM:     Active  Right Eval Left Eval  Hip flexion    Hip extension    Hip abduction    Hip adduction    Hip internal rotation    Hip external rotation    Knee flexion    Knee extension    Ankle dorsiflexion    Ankle plantarflexion    Ankle inversion    Ankle eversion     (Blank rows = not tested)  LOWER EXTREMITY MMT:    MMT Right Eval Left Eval  Hip flexion    Hip extension    Hip abduction    Hip adduction    Hip internal rotation    Hip external rotation    Knee flexion    Knee extension    Ankle dorsiflexion    Ankle plantarflexion    Ankle inversion    Ankle eversion    (Blank rows = not tested) Seated resisted tests grossly 5/5  BED MOBILITY:    TRANSFERS: Independent  RAMP:    CURB:    STAIRS:  Level of Assistance: Modified independence  Stair Negotiation Technique: Step to Pattern Alternating Pattern  with No Rails Bilateral Rails  Demo lateral LOB when attempting stairs w/out HR  GAIT: Gait pattern: decreased stride length Distance walked: 265 Assistive device utilized: None Level of assistance: Complete Independence Comments: decreased velocity  FUNCTIONAL TESTs:  5 times sit to stand: 12.78 sec Timed up and go (TUG): 12.58 sec Berg Balance Scale: 52/56 Dynamic Gait Index: 13/24 2MWT: 265 ft (age-match cohort average 472 ft) Gait speed: 2.2 ft/sec    TODAY'S TREATMENT:     PATIENT EDUCATION: Education details: assessment findings Person educated: Patient Education method: Explanation Education comprehension: verbalized understanding   HOME EXERCISE PROGRAM: To be initiated    GOALS: Goals reviewed with patient? Yes  SHORT TERM GOALS: Target date: 03/11/2022  Report/demonstrate consistent walking  program for HEP Baseline: Goal status: INITIAL  2.  Independent with balance HEP to improve safety with ambulation Baseline:  Goal status: INITIAL   LONG TERM GOALS: Target date: 04/01/2022  Manifest improve safety with gait as evidenced by score 19/24 DGI Baseline: 13/24 Goal status: INITIAL  2.  Manifest improved gait tolerance/speed per distance of 350 ft 2MWT Baseline: 265 ft approx 2.2 ft/sec Goal status: INITIAL    ASSESSMENT:  CLINICAL IMPRESSION: Patient is a 81 y.o. male who was seen today for physical therapy evaluation and treatment for unsteadiness on feet, difficulty in walking. Exhibits reduced functional activity tolerance, balance and gait deficits per limited distance during 2MWT and  score of 13/24 Dynamic Gait Index which indicates risk for falls.  Deficits with stair ambulation with LOB when attempting negotiation w/ HR.  Would benefit from PT Services to address functional deficits and limitations to restore capabilities to PLOF   OBJECTIVE IMPAIRMENTS Abnormal gait, decreased activity tolerance, decreased balance, decreased endurance, difficulty walking, and postural dysfunction.   ACTIVITY LIMITATIONS standing, stairs, and locomotion level  PARTICIPATION LIMITATIONS: shopping, community activity, and yard work  [place] Age and Time since onset of injury/illness/exacerbation are also affecting patient's functional outcome.   REHAB POTENTIAL: Good  CLINICAL DECISION MAKING: Evolving/moderate complexity  EVALUATION COMPLEXITY: Moderate  PLAN: PT FREQUENCY: 1x/week  PT DURATION: 4 weeks  PLANNED INTERVENTIONS: Therapeutic exercises, Therapeutic activity, Neuromuscular re-education, Balance training, Gait training, Patient/Family education, Self Care, Joint mobilization, Stair training, Vestibular training, Canalith repositioning, DME instructions, and Aquatic Therapy  PLAN FOR NEXT SESSION: develop balance HEP, walking program   [employer], PT 02/25/2022, 12:12 PM

## 2022-02-25 NOTE — Therapy (Signed)
OUTPATIENT OCCUPATIONAL THERAPY NEURO EVALUATION  Patient Name: Randy [name] MRN: [id number] DOB:20-Mar-1941, 81 y.o., male Today's Date: 02/25/2022  PCP: Dorothyann Peng, NP REFERRING PROVIDER: Judith Part, MD    OT End of Session - 02/25/22 0813     Visit Number 1    Number of Visits 7    Date for OT Re-Evaluation 04/08/22    Authorization Type [employer]    OT Start Time 0802    OT Stop Time 0845    OT Time Calculation (min) 43 min    Activity Tolerance Patient tolerated treatment well    Behavior During Therapy [facility] for tasks assessed/performed             Past Medical History:  Diagnosis Date   Depression    2022   Diabetes mellitus    Type II   Hypertension    NPH (normal pressure hydrocephalus) (HCC)    OSA (obstructive sleep apnea) 11/24/2016   Past Surgical History:  Procedure Laterality Date   CATARACT EXTRACTION, BILATERAL Bilateral 2019   COLONOSCOPY  07/06/2011   Procedure: COLONOSCOPY;  Surgeon: Winfield Cunas., MD;  Location: [facility] ENDOSCOPY;  Service: Endoscopy;  Laterality: N/A;   ESOPHAGOGASTRODUODENOSCOPY  07/05/2011   Procedure: ESOPHAGOGASTRODUODENOSCOPY (EGD);  Surgeon: Landry Dyke, MD;  Location: [facility] ENDOSCOPY;  Service: Endoscopy;  Laterality: N/A;   EYE SURGERY     FRACTURE SURGERY     right wrist   PLACEMENT OF LUMBAR DRAIN N/A 08/17/2021   Procedure: PLACEMENT OF LUMBAR DRAIN;  Surgeon: Judith Part, MD;  Location: [place];  Service: Neurosurgery;  Laterality: N/A;  3C/RM 19   SHUNT REVISION Right 02/13/2022   Procedure: RIGHT LUMBOPERITONEAL SHUNT REVISION;  Surgeon: Judith Part, MD;  Location: [place];  Service: Neurosurgery;  Laterality: Right;   VENTRICULOPERITONEAL SHUNT Right 12/16/2021   Procedure: Lumboperitoneal shunt placement;  Surgeon: Judith Part, MD;  Location: [place];  Service: Neurosurgery;  Laterality: Right;   Patient Active Problem List   Diagnosis Date Noted   NPH (normal pressure  hydrocephalus) (HCC) 12/16/2021   Normal pressure hydrocephalus ([place]) 08/17/2021   Altered mental status    Encephalopathy 07/29/2021   Dyspnea on exertion 03/18/2020   PVC (premature ventricular contraction) 03/18/2020   Acquired hallux limitus of both feet 06/12/2019   Sleep related hypoxia 01/09/2017   Periodic limb movements of sleep 01/09/2017   OSA (obstructive sleep apnea) 11/24/2016   Upper GI bleed 07/06/2011   GI bleeding 07/05/2011   Anemia associated with acute blood loss 07/05/2011   DM (diabetes mellitus), type 2 ([place]) 07/05/2011   Diabetes mellitus with coincident hypertension ([place]) 07/05/2011   Hyperlipidemia 07/05/2011    ONSET DATE: 02/13/2022  REFERRING DIAG: G93.40 (ICD-10-CM) - Encephalopathy   THERAPY DIAG:  Other abnormalities of gait and mobility  Muscle weakness (generalized)  Other lack of coordination  Attention and concentration deficit  Rationale for Evaluation and Treatment Rehabilitation  SUBJECTIVE:   SUBJECTIVE STATEMENT: Pt reports they found there was a problem with his shunt, so he underwent revision but still reports that he is "not getting any better."  Pt reports that his balance and his gait are still impacted, has difficulty with reading, and notices increased time to complete ADLs. Pt accompanied by: self and spouse (remained in lobby)  PERTINENT HISTORY: lumboparotineal shunt revision on 02/13/22; depression, DM, HTN, NPH, bil cataract surgery, R wrist surgery, ventriculoperitoneal shunt placement 12/16/21.   PRECAUTIONS: Fall  WEIGHT BEARING RESTRICTIONS  No  PAIN:  Are you having pain? No  FALLS: Has patient fallen in last 6 months? Yes. Number of falls 1  LIVING ENVIRONMENT: Lives with: lives with their spouse Lives in: House/apartment Stairs: Yes: Internal: full flights to upstairs, however pt is able to remain on main floor with full bedroom/bathroom; and External: 1-2 steps; none Has following equipment at home: Single  point cane, shower chair, bed side commode, and Grab bars  PLOF: Independent, Independent with basic ADLs, Independent with household mobility without device, Independent with community mobility without device, and Independent with gait  PATIENT GOALS to get back to normal  OBJECTIVE:   HAND DOMINANCE: Right  ADLs: Overall ADLs: reports able to complete all tasks, however requires increased time to complete Transfers/ambulation related to ADLs: not utilizing any AD, however slow and shuffling gait intermittently  Equipment: Shower seat with back, Grab bars, and Walk in shower   IADLs: Light housekeeping: reports decreased endurance and motivations to engage in household tasks Meal Prep: has been cooking on the grill, reports it is going "fair" as it takes longer to do everything. Reports that he has to concentrate more when he is cooking Community mobility: not driving at this time Medication management: wife fills pill box for pt and then he is able to take them Financial management: wife does this  MOBILITY STATUS:  slower gait with intermittent shuffling  POSTURE COMMENTS:  rounded shoulders  ACTIVITY TOLERANCE: Activity tolerance: WFL for tasks assessed during eval, however pt reports decreased activity tolerance and increased fatigue  UPPER EXTREMITY ROM:  WFL in both UE   UPPER EXTREMITY MMT:   Grossly 4/5 overall BUE   HAND FUNCTION: Grip strength: Right: 56 lbs; Left: 51 lbs, Lateral pinch: Right: 14 lbs, Left: 16 lbs, and 3 point pinch: Right: 13 lbs, Left: 14 lbs  COORDINATION: Finger Nose Finger test: very mild dysmetria on L compared to R 9 Hole Peg test: Right: 31.35 sec; Left: 40.28 sec Rapid alternating hand movements: slight lag on L compared to R  SENSATION: WFL  COGNITION: Overall cognitive status: Impaired. Pt reports difficulty with simple math in his head and completion of word puzzles.  Reports not being able to attend to reading/have the  motivation to read as he did before. BIMS: Pt oriented to day, month, year  STM: able to recall blue, bed, sock  Recall (after 5 mins): able to recall blue, bed, sock  VISION: Subjective report: "it varies now" Pt reports difficulty with reading book and reading caption on tv Baseline vision: Wears glasses all the time Visual history: cataracts, had cataract surgery  VISION ASSESSMENT: Ocular ROM: WFL Tracking/Visual pursuits: Requires cues, head turns, or add eye shifts to track Saccades: additional eye shifts occurred during testing, undershoots, and decreased speed of saccadic movements  Patient has difficulty with following activities due to following visual impairments: reading  TODAY'S TREATMENT:  Educated on typical recommendations regarding return to driving and deferring to MD at this time.     PATIENT EDUCATION: Education details: Educated on role and purpose of OT as well as potential interventions and goals for therapy based on initial evaluation findings. Person educated: Patient Education method: Explanation Education comprehension: verbalized understanding   HOME EXERCISE PROGRAM: TBD    GOALS: Goals reviewed with patient? No  SHORT TERM GOALS: Target date: 03/18/22  Pt will verbalize understanding of energy conservation techniques  Baseline: Goal status: INITIAL  2.  Pt will verbalize understanding of task modifications and/or potential A/E  needs to increase ease, safety, and independence w/ ADLs. Baseline:  Goal status: INITIAL   LONG TERM GOALS: Target date: 04/08/22  Pt will demonstrate ability to sequence simple functional task (simple snack prep, laundry task, etc) at Mod I level with good safety awareness. Baseline:  Goal status: INITIAL  2.  Pt will navigate a moderately busy environment, completing dual task activity and/or following multi-step commands with 90% accuracy Baseline:  Goal status: INITIAL  3.  Pt will complete dual tasking  activity with improvements in reaction time to increase independence with IADLs and possible return to driving PRN. Baseline:  Goal status: INITIAL  4.  Pt will report understanding of return to driving recommendations. Baseline:  Goal status: INITIAL  5.  Pt will demonstrate improved fine motor coordination for ADLs as evidenced by decreasing 9 hole peg test score for LUE by 3 secs Baseline: Right: 31.35 sec; Left: 40.28 sec Goal status: INITIAL  6.  Pt will complete table top scanning activity with Supervision to demonstrate improved scanning and alternating attention. Baseline:  Goal status: INITIAL  ASSESSMENT:  CLINICAL IMPRESSION: Patient is a 81 y.o. male who was seen today for occupational therapy evaluation s/p shunt revision with resulting encephalopathy.  Pt demonstrates impairments in coordination, visual scanning, alternating attention, and working memory and recall impacting ability to engage in ADLs, IADLs, and leisure pursuits at [facility]. Pt currently lives with spouse in a one story home. PMHx includes depression, DM, HTN, NPH, bil cataract surgery, R wrist surgery, ventriculoperitoneal shunt placement 12/16/21. Pt will benefit from skilled occupational therapy services to address coordination, balance, GM/FM control, cognition, safety awareness, introduction of compensatory strategies/AE prn,and implementation of an HEP to improve participation and safety during ADLs, IADL, and leisure pursuits.   PERFORMANCE DEFICITS in functional skills including ADLs, IADLs, coordination, [place], balance, endurance, decreased knowledge of precautions, and UE functional use, cognitive skills including attention, memory, problem solving, and thought, and psychosocial skills including coping strategies and routines and behaviors.   IMPAIRMENTS are limiting patient from ADLs, IADLs, and leisure.   COMORBIDITIES may have co-morbidities  that affects occupational performance. Patient will benefit  from skilled OT to address above impairments and improve overall function.  MODIFICATION OR ASSISTANCE TO COMPLETE EVALUATION: Min-Moderate modification of tasks or assist with assess necessary to complete an evaluation.  OT OCCUPATIONAL PROFILE AND HISTORY: Detailed assessment: Review of records and additional review of physical, cognitive, psychosocial history related to current functional performance.  CLINICAL DECISION MAKING: LOW - limited treatment options, no task modification necessary  REHAB POTENTIAL: Good  EVALUATION COMPLEXITY: Low    PLAN: OT FREQUENCY: 1-2x/week  OT DURATION: 6 weeks  PLANNED INTERVENTIONS: self care/ADL training, therapeutic activity, functional mobility training, patient/family education, cognitive remediation/compensation, visual/perceptual remediation/compensation, psychosocial skills training, energy conservation, coping strategies training, and DME and/or AE instructions  RECOMMENDED OTHER SERVICES: NA  CONSULTED AND AGREED WITH PLAN OF CARE: Patient  PLAN FOR NEXT SESSION: Complete pill box assessment, begin cognitive tasks (word puzzles/brain games), address reaction time and visual scanning/attention   Fairley Copher, OTR/L 02/25/2022, 10:52 AM

## 2022-02-25 NOTE — Telephone Encounter (Signed)
Received a fax for O2 from [place]. However, this will need to come from Pulmonary. Looks like pt have not been seen since 2019. We will refer him back to Pulmonary and let him know the update. Huey Romans will be notified that this will need to come from Pulmonary.

## 2022-03-03 ENCOUNTER — Ambulatory Visit: Payer: Medicare PPO | Admitting: Occupational Therapy

## 2022-03-03 ENCOUNTER — Ambulatory Visit: Payer: Medicare PPO

## 2022-03-03 DIAGNOSIS — R278 Other lack of coordination: Secondary | ICD-10-CM | POA: Diagnosis not present

## 2022-03-03 DIAGNOSIS — R2681 Unsteadiness on feet: Secondary | ICD-10-CM | POA: Diagnosis not present

## 2022-03-03 DIAGNOSIS — R2689 Other abnormalities of gait and mobility: Secondary | ICD-10-CM

## 2022-03-03 DIAGNOSIS — R41840 Attention and concentration deficit: Secondary | ICD-10-CM | POA: Diagnosis not present

## 2022-03-03 DIAGNOSIS — R262 Difficulty in walking, not elsewhere classified: Secondary | ICD-10-CM | POA: Diagnosis not present

## 2022-03-03 DIAGNOSIS — M6281 Muscle weakness (generalized): Secondary | ICD-10-CM | POA: Diagnosis not present

## 2022-03-03 NOTE — Therapy (Signed)
OUTPATIENT PHYSICAL THERAPY NEURO TREAMENT   Patient Name: Randy [name] MRN: [id number] DOB:[birth year], 81 y.o., male Today's Date: 03/03/2022   PCP: Dorothyann Peng, NP REFERRING PROVIDER: Judith Part, MD   PT End of Session - 03/03/22 0848     Visit Number 2    Number of Visits 4    Date for PT Re-Evaluation 04/01/22    Authorization Type [employer]    PT Start Time 0845    PT Stop Time 0930    PT Time Calculation (min) 45 min    Activity Tolerance Patient tolerated treatment well    Behavior During Therapy [facility] for tasks assessed/performed             Past Medical History:  Diagnosis Date   Depression    2022   Diabetes mellitus    Type II   Hypertension    NPH (normal pressure hydrocephalus) (HCC)    OSA (obstructive sleep apnea) 11/24/2016   Past Surgical History:  Procedure Laterality Date   CATARACT EXTRACTION, BILATERAL Bilateral 2019   COLONOSCOPY  07/06/2011   Procedure: COLONOSCOPY;  Surgeon: Winfield Cunas., MD;  Location: [facility] ENDOSCOPY;  Service: Endoscopy;  Laterality: N/A;   ESOPHAGOGASTRODUODENOSCOPY  07/05/2011   Procedure: ESOPHAGOGASTRODUODENOSCOPY (EGD);  Surgeon: Landry Dyke, MD;  Location: [facility] ENDOSCOPY;  Service: Endoscopy;  Laterality: N/A;   EYE SURGERY     FRACTURE SURGERY     right wrist   PLACEMENT OF LUMBAR DRAIN N/A 08/17/2021   Procedure: PLACEMENT OF LUMBAR DRAIN;  Surgeon: Judith Part, MD;  Location: [place];  Service: Neurosurgery;  Laterality: N/A;  3C/RM 19   SHUNT REVISION Right 02/13/2022   Procedure: RIGHT LUMBOPERITONEAL SHUNT REVISION;  Surgeon: Judith Part, MD;  Location: [place];  Service: Neurosurgery;  Laterality: Right;   VENTRICULOPERITONEAL SHUNT Right 12/16/2021   Procedure: Lumboperitoneal shunt placement;  Surgeon: Judith Part, MD;  Location: [place];  Service: Neurosurgery;  Laterality: Right;   Patient Active Problem List   Diagnosis Date Noted   NPH (normal pressure  hydrocephalus) (HCC) 12/16/2021   Normal pressure hydrocephalus ([place]) 08/17/2021   Altered mental status    Encephalopathy 07/29/2021   Dyspnea on exertion 03/18/2020   PVC (premature ventricular contraction) 03/18/2020   Acquired hallux limitus of both feet 06/12/2019   Sleep related hypoxia 01/09/2017   Periodic limb movements of sleep 01/09/2017   OSA (obstructive sleep apnea) 11/24/2016   Upper GI bleed 07/06/2011   GI bleeding 07/05/2011   Anemia associated with acute blood loss 07/05/2011   DM (diabetes mellitus), type 2 ([place]) 07/05/2011   Diabetes mellitus with coincident hypertension ([place]) 07/05/2011   Hyperlipidemia 07/05/2011    ONSET DATE: 02/11/22  REFERRING DIAG: G93.40 (ICD-10-CM) - Encephalopathy   THERAPY DIAG:  Muscle weakness (generalized)  Unsteadiness on feet  Difficulty in walking, not elsewhere classified  Other abnormalities of gait and mobility  Rationale for Evaluation and Treatment Rehabilitation  SUBJECTIVE:  SUBJECTIVE STATEMENT: Notes feeling a slight HA, blurred vision (which reports months of this issue) Pt accompanied by: self  PERTINENT HISTORY: recent shunt placement and revision  PAIN:  Are you having pain? No, some headaches  PRECAUTIONS: Fall  WEIGHT BEARING RESTRICTIONS No  FALLS: Has patient fallen in last 6 months? Yes. Number of falls 1  LIVING ENVIRONMENT: Lives with: lives with their spouse Lives in: House/apartment Stairs: Yes: Internal: 12 steps; can reach both and External: 3-4, 2 steps; on right going up Has following equipment at home: Single point cane, has ground-floor set-up  PLOF: Independent  PATIENT GOALS "stamina is lacking"  OBJECTIVE:   TODAY'S TREATMENT: 03/03/22 Activity Comments  Gait training -Obstacle course:  fig 8, over hurdles, unexpected zig-zag around chair over 40 ft distance completing 6 rounds -retrowalking: cues for increased hip ext  Sit-stand to cone taps 1x10 ipsilat, 1x10 contralat  Standing on foam EO/EC 3x15 sec Head turns EO/EC 1x5  Tandem stance X 30 sec           DIAGNOSTIC FINDINGS:   COGNITION: Overall cognitive status: Impaired   SENSATION: WFL  COORDINATION: WNL: rapid, alternating foot/hand tapping. Heel to shin, finger to nose    MUSCLE TONE: WNL     POSTURE: rounded shoulders and forward head  LOWER EXTREMITY ROM:     Active  Right Eval Left Eval  Hip flexion    Hip extension    Hip abduction    Hip adduction    Hip internal rotation    Hip external rotation    Knee flexion    Knee extension    Ankle dorsiflexion    Ankle plantarflexion    Ankle inversion    Ankle eversion     (Blank rows = not tested)  LOWER EXTREMITY MMT:    MMT Right Eval Left Eval  Hip flexion    Hip extension    Hip abduction    Hip adduction    Hip internal rotation    Hip external rotation    Knee flexion    Knee extension    Ankle dorsiflexion    Ankle plantarflexion    Ankle inversion    Ankle eversion    (Blank rows = not tested) Seated resisted tests grossly 5/5  BED MOBILITY:    TRANSFERS: Independent  RAMP:    CURB:    STAIRS:  Level of Assistance: Modified independence  Stair Negotiation Technique: Step to Pattern Alternating Pattern  with No Rails Bilateral Rails  Demo lateral LOB when attempting stairs w/out HR  GAIT: Gait pattern: decreased stride length Distance walked: 265 Assistive device utilized: None Level of assistance: Complete Independence Comments: decreased velocity  FUNCTIONAL TESTs:  5 times sit to stand: 12.78 sec Timed up and go (TUG): 12.58 sec Berg Balance Scale: 52/56 Dynamic Gait Index: 13/24 2MWT: 265 ft (age-match cohort average 472 ft) Gait speed: 2.2 ft/sec    TODAY'S TREATMENT:      PATIENT EDUCATION: Education details: assessment findings Person educated: Patient Education method: Explanation Education comprehension: verbalized understanding   HOME EXERCISE PROGRAM: To be initiated    GOALS: Goals reviewed with patient? Yes  SHORT TERM GOALS: Target date: 03/11/2022  Report/demonstrate consistent walking program for HEP Baseline: Goal status: INITIAL  2.  Independent with balance HEP to improve safety with ambulation Baseline:  Goal status: INITIAL   LONG TERM GOALS: Target date: 04/01/2022  Manifest improve safety with gait as evidenced by score 19/24 DGI Baseline: 13/24 Goal status:  INITIAL  2.  Manifest improved gait tolerance/speed per distance of 350 ft 2MWT Baseline: 265 ft approx 2.2 ft/sec Goal status: INITIAL    ASSESSMENT:  CLINICAL IMPRESSION: Tolerated tx session well without issue. Ambulates with decreased velocity and decreased step length. Difficulty with retro-walking via limited hip extension. Requires decreased velocity for changing direction and stepping over obstacles. Notes difficulty with long-distance/time walking due to back pain/fatigue.  Will trial trekking poles at next session   OBJECTIVE IMPAIRMENTS Abnormal gait, decreased activity tolerance, decreased balance, decreased endurance, difficulty walking, and postural dysfunction.   ACTIVITY LIMITATIONS standing, stairs, and locomotion level  PARTICIPATION LIMITATIONS: shopping, community activity, and yard work  [place] Age and Time since onset of injury/illness/exacerbation are also affecting patient's functional outcome.   REHAB POTENTIAL: Good  CLINICAL DECISION MAKING: Evolving/moderate complexity  EVALUATION COMPLEXITY: Moderate  PLAN: PT FREQUENCY: 1x/week  PT DURATION: 4 weeks  PLANNED INTERVENTIONS: Therapeutic exercises, Therapeutic activity, Neuromuscular re-education, Balance training, Gait training, Patient/Family education, Self  Care, Joint mobilization, Stair training, Vestibular training, [place] repositioning, DME instructions, and Aquatic Therapy  PLAN FOR NEXT SESSION: develop balance HEP, trekking poles   [employer], PT 03/03/2022, 8:49 AM

## 2022-03-03 NOTE — Therapy (Signed)
OUTPATIENT OCCUPATIONAL THERAPY Treatment Session  Patient Name: Randy [name] MRN: [id number] DOB:1940/09/08, 81 y.o., male Today's Date: 03/03/2022  PCP: Dorothyann Peng, NP REFERRING PROVIDER: Judith Part, MD    OT End of Session - 03/03/22 [phone]     Visit Number 2    Number of Visits 7    Date for OT Re-Evaluation 04/08/22    Authorization Type [employer]    OT Start Time 0803    OT Stop Time 0845    OT Time Calculation (min) 42 min    Activity Tolerance Patient tolerated treatment well    Behavior During Therapy [facility] for tasks assessed/performed              Past Medical History:  Diagnosis Date   Depression    2022   Diabetes mellitus    Type II   Hypertension    NPH (normal pressure hydrocephalus) (HCC)    OSA (obstructive sleep apnea) 11/24/2016   Past Surgical History:  Procedure Laterality Date   CATARACT EXTRACTION, BILATERAL Bilateral 2019   COLONOSCOPY  07/06/2011   Procedure: COLONOSCOPY;  Surgeon: Winfield Cunas., MD;  Location: [facility] ENDOSCOPY;  Service: Endoscopy;  Laterality: N/A;   ESOPHAGOGASTRODUODENOSCOPY  07/05/2011   Procedure: ESOPHAGOGASTRODUODENOSCOPY (EGD);  Surgeon: Landry Dyke, MD;  Location: [facility] ENDOSCOPY;  Service: Endoscopy;  Laterality: N/A;   EYE SURGERY     FRACTURE SURGERY     right wrist   PLACEMENT OF LUMBAR DRAIN N/A 08/17/2021   Procedure: PLACEMENT OF LUMBAR DRAIN;  Surgeon: Judith Part, MD;  Location: [place];  Service: Neurosurgery;  Laterality: N/A;  3C/RM 19   SHUNT REVISION Right 02/13/2022   Procedure: RIGHT LUMBOPERITONEAL SHUNT REVISION;  Surgeon: Judith Part, MD;  Location: [place];  Service: Neurosurgery;  Laterality: Right;   VENTRICULOPERITONEAL SHUNT Right 12/16/2021   Procedure: Lumboperitoneal shunt placement;  Surgeon: Judith Part, MD;  Location: [place];  Service: Neurosurgery;  Laterality: Right;   Patient Active Problem List   Diagnosis Date Noted   NPH (normal  pressure hydrocephalus) (HCC) 12/16/2021   Normal pressure hydrocephalus ([place]) 08/17/2021   Altered mental status    Encephalopathy 07/29/2021   Dyspnea on exertion 03/18/2020   PVC (premature ventricular contraction) 03/18/2020   Acquired hallux limitus of both feet 06/12/2019   Sleep related hypoxia 01/09/2017   Periodic limb movements of sleep 01/09/2017   OSA (obstructive sleep apnea) 11/24/2016   Upper GI bleed 07/06/2011   GI bleeding 07/05/2011   Anemia associated with acute blood loss 07/05/2011   DM (diabetes mellitus), type 2 ([place]) 07/05/2011   Diabetes mellitus with coincident hypertension ([place]) 07/05/2011   Hyperlipidemia 07/05/2011    ONSET DATE: 02/13/2022  REFERRING DIAG: G93.40 (ICD-10-CM) - Encephalopathy   THERAPY DIAG:  Muscle weakness (generalized)  Unsteadiness on feet  Other lack of coordination  Attention and concentration deficit  Rationale for Evaluation and Treatment Rehabilitation  SUBJECTIVE:   SUBJECTIVE STATEMENT: Pt reports "feeling a little wobbly" but having no falls. Pt accompanied by: self and spouse (remained in lobby)  PERTINENT HISTORY: lumboparotineal shunt revision on 02/13/22; depression, DM, HTN, NPH, bil cataract surgery, R wrist surgery, ventriculoperitoneal shunt placement 12/16/21.   PRECAUTIONS: Fall  WEIGHT BEARING RESTRICTIONS No  PAIN:  Are you having pain? No  FALLS: Has patient fallen in last 6 months? Yes. Number of falls 1  PATIENT GOALS to get back to normal  OBJECTIVE:   HAND DOMINANCE: Right  ADLs:  Overall ADLs: reports able to complete all tasks, however requires increased time to complete Transfers/ambulation related to ADLs: not utilizing any AD, however slow and shuffling gait intermittently  Equipment: Shower seat with back, Grab bars, and Walk in shower   IADLs: Light housekeeping: reports decreased endurance and motivations to engage in household tasks Meal Prep: has been cooking on the grill,  reports it is going "fair" as it takes longer to do everything. Reports that he has to concentrate more when he is cooking Community mobility: not driving at this time Medication management: wife fills pill box for pt and then he is able to take them Financial management: wife does this  MOBILITY STATUS:  slower gait with intermittent shuffling  COORDINATION: Finger Nose Finger test: very mild dysmetria on L compared to R 9 Hole Peg test: Right: 31.35 sec; Left: 40.28 sec Rapid alternating hand movements: slight lag on L compared to R  SENSATION: WFL  COGNITION: Overall cognitive status: Impaired. Pt reports difficulty with simple math in his head and completion of word puzzles.  Reports not being able to attend to reading/have the motivation to read as he did before. BIMS: Pt oriented to day, month, year  STM: able to recall blue, bed, sock  Recall (after 5 mins): able to recall blue, bed, sock  VISION: Subjective report: "it varies now" Pt reports difficulty with reading book and reading caption on tv Baseline vision: Wears glasses all the time Visual history: cataracts, had cataract surgery  VISION ASSESSMENT: Ocular ROM: WFL Tracking/Visual pursuits: Requires cues, head turns, or add eye shifts to track Saccades: additional eye shifts occurred during testing, undershoots, and decreased speed of saccadic movements  Patient has difficulty with following activities due to following visual impairments: reading --------------------------------------------------------------------------------------------------------------------------------------------------------   TODAY'S TREATMENT:  Pill box assessment: Completed in 12:40.97.  Pt was making progress appropriately until he knocked over the pill box having to refill some slots and double check for errors.  Pt omitted 1 whole medication, resulting in omitting 7 pills.  Engaged in discussion of omission error and technique to reduce  omission errors.  Pt reports wife typically fills his pill box and that most of his medications are only taken 1x/day which makes for reduced errors. Cup stacking:  pt stacking in pyramid of 10 with initial demonstration, however when challenged to complete pattern of 3 -6-3, pt requiring max cues for problem solving to complete with pyramid of 6 most challenging. Trail A: 50.5 seconds with picking up pen x2.  Engaged in alternating attention paper and pen activity with locating fruits in specific pattern on picture: 51.63 sec.  Discussed impact of impaired alternating attention in functional tasks such as meal prep, driving, and even walking and talking.  PATIENT EDUCATION: Education details: educated on impacts of medication errors, problem solving and awareness during cup stacking task, and implications of impaired alternating attention of functional tasks. Person educated: Patient Education method: Explanation Education comprehension: verbalized understanding   HOME EXERCISE PROGRAM: TBD    GOALS: Goals reviewed with patient? Yes  SHORT TERM GOALS: Target date: 03/18/22  Pt will verbalize understanding of energy conservation techniques  Baseline: Goal status: IN PROGRESS  2.  Pt will verbalize understanding of task modifications and/or potential A/E needs to increase ease, safety, and independence w/ ADLs. Baseline:  Goal status: IN PROGRESS   LONG TERM GOALS: Target date: 04/08/22  Pt will demonstrate ability to sequence simple functional task (simple snack prep, laundry task, etc) at Mod I level  with good safety awareness. Baseline:  Goal status: IN PROGRESS  2.  Pt will navigate a moderately busy environment, completing dual task activity and/or following multi-step commands with 90% accuracy Baseline:  Goal status: IN PROGRESS  3.  Pt will complete dual tasking activity with improvements in reaction time to increase independence with IADLs and possible return to driving  PRN. Baseline:  Goal status: IN PROGRESS  4.  Pt will report understanding of return to driving recommendations. Baseline:  Goal status: IN PROGRESS  5.  Pt will demonstrate improved fine motor coordination for ADLs as evidenced by decreasing 9 hole peg test score for LUE by 3 secs Baseline: Right: 31.35 sec; Left: 40.28 sec Goal status: IN PROGRESS  6.  Pt will complete table top scanning activity with Supervision to demonstrate improved scanning and alternating attention. Baseline:  Goal status: IN PROGRESS  ASSESSMENT:  CLINICAL IMPRESSION: Pt seen for first therapy session s/p initial evaluation.  Therapist discussed established goals with pt, pt in agreement.  Treatment session with focus on alternating attention, problem solving, sequencing, and reaction time.  Pt continues to be quite slow with mobility as well as sequencing and problem solving during structured and functional tasks.  Pt receptive to education on implications of decreased alternating attention on functional tasks.    PERFORMANCE DEFICITS in functional skills including ADLs, IADLs, coordination, Sac, balance, endurance, decreased knowledge of precautions, and UE functional use, cognitive skills including attention, memory, problem solving, and thought, and psychosocial skills including coping strategies and routines and behaviors.   IMPAIRMENTS are limiting patient from ADLs, IADLs, and leisure.   COMORBIDITIES may have co-morbidities  that affects occupational performance. Patient will benefit from skilled OT to address above impairments and improve overall function.  MODIFICATION OR ASSISTANCE TO COMPLETE EVALUATION: Min-Moderate modification of tasks or assist with assess necessary to complete an evaluation.  OT OCCUPATIONAL PROFILE AND HISTORY: Detailed assessment: Review of records and additional review of physical, cognitive, psychosocial history related to current functional performance.  CLINICAL DECISION  MAKING: LOW - limited treatment options, no task modification necessary  REHAB POTENTIAL: Good  EVALUATION COMPLEXITY: Low    PLAN: OT FREQUENCY: 1-2x/week  OT DURATION: 6 weeks  PLANNED INTERVENTIONS: self care/ADL training, therapeutic activity, functional mobility training, patient/family education, cognitive remediation/compensation, visual/perceptual remediation/compensation, psychosocial skills training, energy conservation, coping strategies training, and DME and/or AE instructions  RECOMMENDED OTHER SERVICES: NA  CONSULTED AND AGREED WITH PLAN OF CARE: Patient  PLAN FOR NEXT SESSION: begin cognitive tasks (word puzzles/brain games), address reaction time and visual scanning/attention, educate on energy conservation strategies.   Simonne Come, OTR/L 03/03/2022, 8:12 AM

## 2022-03-08 ENCOUNTER — Ambulatory Visit: Payer: Medicare PPO | Attending: Neurological Surgery | Admitting: Occupational Therapy

## 2022-03-08 ENCOUNTER — Ambulatory Visit: Payer: Medicare PPO

## 2022-03-08 DIAGNOSIS — M6281 Muscle weakness (generalized): Secondary | ICD-10-CM

## 2022-03-08 DIAGNOSIS — R278 Other lack of coordination: Secondary | ICD-10-CM | POA: Insufficient documentation

## 2022-03-08 DIAGNOSIS — R2689 Other abnormalities of gait and mobility: Secondary | ICD-10-CM

## 2022-03-08 DIAGNOSIS — R262 Difficulty in walking, not elsewhere classified: Secondary | ICD-10-CM

## 2022-03-08 DIAGNOSIS — R2681 Unsteadiness on feet: Secondary | ICD-10-CM | POA: Diagnosis not present

## 2022-03-08 DIAGNOSIS — R41840 Attention and concentration deficit: Secondary | ICD-10-CM | POA: Insufficient documentation

## 2022-03-08 NOTE — Therapy (Signed)
OUTPATIENT OCCUPATIONAL THERAPY Treatment Session  Patient Name: Randy [name] MRN: [id number] DOB:03/13/1941, 81 y.o., male Today's Date: 03/08/2022  PCP: Dorothyann Peng, NP REFERRING PROVIDER: Judith Part, MD    OT End of Session - 03/08/22 0804     Visit Number 3    Number of Visits 7    Date for OT Re-Evaluation 04/08/22    Authorization Type [employer]    OT Start Time 0803    OT Stop Time 0845    OT Time Calculation (min) 42 min    Activity Tolerance Patient tolerated treatment well    Behavior During Therapy [facility] for tasks assessed/performed               Past Medical History:  Diagnosis Date   Depression    2022   Diabetes mellitus    Type II   Hypertension    NPH (normal pressure hydrocephalus) (HCC)    OSA (obstructive sleep apnea) 11/24/2016   Past Surgical History:  Procedure Laterality Date   CATARACT EXTRACTION, BILATERAL Bilateral 2019   COLONOSCOPY  07/06/2011   Procedure: COLONOSCOPY;  Surgeon: Winfield Cunas., MD;  Location[facility] ENDOSCOPY;  Service: Endoscopy;  Laterality: N/A;   ESOPHAGOGASTRODUODENOSCOPY  07/05/2011   Procedure: ESOPHAGOGASTRODUODENOSCOPY (EGD);  Surgeon: Landry Dyke, MD;  Location: [facility] ENDOSCOPY;  Service: Endoscopy;  Laterality: N/A;   EYE SURGERY     FRACTURE SURGERY     right wrist   PLACEMENT OF LUMBAR DRAIN N/A 08/17/2021   Procedure: PLACEMENT OF LUMBAR DRAIN;  Surgeon: Judith Part, MD;  Location: [place];  Service: Neurosurgery;  Laterality: N/A;  3C/RM 19   SHUNT REVISION Right 02/13/2022   Procedure: RIGHT LUMBOPERITONEAL SHUNT REVISION;  Surgeon: Judith Part, MD;  Location: [place];  Service: Neurosurgery;  Laterality: Right;   VENTRICULOPERITONEAL SHUNT Right 12/16/2021   Procedure: Lumboperitoneal shunt placement;  Surgeon: Judith Part, MD;  Location: [place];  Service: Neurosurgery;  Laterality: Right;   Patient Active Problem List   Diagnosis Date Noted   NPH (normal  pressure hydrocephalus) (HCC) 12/16/2021   Normal pressure hydrocephalus ([place]) 08/17/2021   Altered mental status    Encephalopathy 07/29/2021   Dyspnea on exertion 03/18/2020   PVC (premature ventricular contraction) 03/18/2020   Acquired hallux limitus of both feet 06/12/2019   Sleep related hypoxia 01/09/2017   Periodic limb movements of sleep 01/09/2017   OSA (obstructive sleep apnea) 11/24/2016   Upper GI bleed 07/06/2011   GI bleeding 07/05/2011   Anemia associated with acute blood loss 07/05/2011   DM (diabetes mellitus), type 2 ([place]) 07/05/2011   Diabetes mellitus with coincident hypertension ([place]) 07/05/2011   Hyperlipidemia 07/05/2011    ONSET DATE: 02/13/2022  REFERRING DIAG: G93.40 (ICD-10-CM) - Encephalopathy   THERAPY DIAG:  Muscle weakness (generalized)  Other lack of coordination  Attention and concentration deficit  Rationale for Evaluation and Treatment Rehabilitation  SUBJECTIVE:   SUBJECTIVE STATEMENT: Pt reports feeling like he doesn't have the confidence and ability to complete typical tasks that he would have done before.  Pt accompanied by: self and spouse (remained in lobby)  PERTINENT HISTORY: lumboparotineal shunt revision on 02/13/22; depression, DM, HTN, NPH, bil cataract surgery, R wrist surgery, ventriculoperitoneal shunt placement 12/16/21.   PRECAUTIONS: Fall  WEIGHT BEARING RESTRICTIONS No  PAIN:  Are you having pain? No  FALLS: Has patient fallen in last 6 months? Yes. Number of falls 1  PATIENT GOALS to get back to normal  OBJECTIVE:   HAND DOMINANCE: Right  ADLs: Overall ADLs: reports able to complete all tasks, however requires increased time to complete Transfers/ambulation related to ADLs: not utilizing any AD, however slow and shuffling gait intermittently  Equipment: Shower seat with back, Grab bars, and Walk in shower   IADLs: Light housekeeping: reports decreased endurance and motivations to engage in household  tasks Meal Prep: has been cooking on the grill, reports it is going "fair" as it takes longer to do everything. Reports that he has to concentrate more when he is cooking Community mobility: not driving at this time Medication management: wife fills pill box for pt and then he is able to take them Financial management: wife does this  MOBILITY STATUS:  slower gait with intermittent shuffling  COORDINATION: Finger Nose Finger test: very mild dysmetria on L compared to R 9 Hole Peg test: Right: 31.35 sec; Left: 40.28 sec Rapid alternating hand movements: slight lag on L compared to R  SENSATION: WFL  COGNITION: Overall cognitive status: Impaired. Pt reports difficulty with simple math in his head and completion of word puzzles.  Reports not being able to attend to reading/have the motivation to read as he did before. BIMS: Pt oriented to day, month, year  STM: able to recall blue, bed, sock  Recall (after 5 mins): able to recall blue, bed, sock  VISION: Subjective report: "it varies now" Pt reports difficulty with reading book and reading caption on tv Baseline vision: Wears glasses all the time Visual history: cataracts, had cataract surgery  VISION ASSESSMENT: Ocular ROM: WFL Tracking/Visual pursuits: Requires cues, head turns, or add eye shifts to track Saccades: additional eye shifts occurred during testing, undershoots, and decreased speed of saccadic movements  Patient has difficulty with following activities due to following visual impairments: reading --------------------------------------------------------------------------------------------------------------------------------------------------------   TODAY'S TREATMENT:  Engaged in small peg board pattern replication with use of key to identify correlating colors, challenging alternating attention and L hand coordination and dexterity. Pt demonstrating no difficulty with manipulation of pegs in-hand to place in peg board.  Pt scanning in linear fashion, however about halfway through transitioning to vertical scanning.  Educated on use of line follow with ruler, bookmark, or piece of paper to increase recall and organization of task.  Pt requiring significantly increased time to complete, completed trial with ruler with min improvements in sequencing and recall.  Educated on functional carryover of task implications with systematic process to minimize onset of errors. Energy conservation: Reviewed 4P's of energy conservation with planning, pacing, prioritizing, and positioning during ADLs and IADLs.  Therapist providing additional demonstration and techniques to increase success and facilitate improved safety during routine tasks.  Provided with handout.   PATIENT EDUCATION: Education details: educated on challenges with alternating attention impacting functional tasks, discussed energy conservation strategies and provided with handout Person educated: Patient Education method: Explanation and Handouts Education comprehension: verbalized understanding   HOME EXERCISE PROGRAM: TBD    GOALS: Goals reviewed with patient? Yes  SHORT TERM GOALS: Target date: 03/18/22  Pt will verbalize understanding of energy conservation techniques  Baseline: Goal status: IN PROGRESS  2.  Pt will verbalize understanding of task modifications and/or potential A/E needs to increase ease, safety, and independence w/ ADLs. Baseline:  Goal status: IN PROGRESS   LONG TERM GOALS: Target date: 04/08/22  Pt will demonstrate ability to sequence simple functional task (simple snack prep, laundry task, etc) at Mod I level with good safety awareness. Baseline:  Goal status: IN PROGRESS  2.  Pt will navigate a moderately busy environment, completing dual task activity and/or following multi-step commands with 90% accuracy Baseline:  Goal status: IN PROGRESS  3.  Pt will complete dual tasking activity with improvements in reaction  time to increase independence with IADLs and possible return to driving PRN. Baseline:  Goal status: IN PROGRESS  4.  Pt will report understanding of return to driving recommendations. Baseline:  Goal status: IN PROGRESS  5.  Pt will demonstrate improved fine motor coordination for ADLs as evidenced by decreasing 9 hole peg test score for LUE by 3 secs Baseline: Right: 31.35 sec; Left: 40.28 sec Goal status: IN PROGRESS  6.  Pt will complete table top scanning activity with Supervision to demonstrate improved scanning and alternating attention. Baseline:  Goal status: IN PROGRESS  ASSESSMENT:  CLINICAL IMPRESSION: Treatment session with focus on alternating attention, problem solving, sequencing, and reaction time.  Pt continues to be quite slow with mobility as well as sequencing and problem solving during structured and functional tasks.  Pt receptive to education on implications of decreased alternating attention on functional tasks.  Engaged in education on energy conservation strategies and implications to increase safety and independence during functional tasks.  PERFORMANCE DEFICITS in functional skills including ADLs, IADLs, coordination, [place], balance, endurance, decreased knowledge of precautions, and UE functional use, cognitive skills including attention, memory, problem solving, and thought, and psychosocial skills including coping strategies and routines and behaviors.   IMPAIRMENTS are limiting patient from ADLs, IADLs, and leisure.   COMORBIDITIES may have co-morbidities  that affects occupational performance. Patient will benefit from skilled OT to address above impairments and improve overall function.  MODIFICATION OR ASSISTANCE TO COMPLETE EVALUATION: Min-Moderate modification of tasks or assist with assess necessary to complete an evaluation.  OT OCCUPATIONAL PROFILE AND HISTORY: Detailed assessment: Review of records and additional review of physical, cognitive,  psychosocial history related to current functional performance.  CLINICAL DECISION MAKING: LOW - limited treatment options, no task modification necessary  REHAB POTENTIAL: Good  EVALUATION COMPLEXITY: Low    PLAN: OT FREQUENCY: 1-2x/week  OT DURATION: 6 weeks  PLANNED INTERVENTIONS: self care/ADL training, therapeutic activity, functional mobility training, patient/family education, cognitive remediation/compensation, visual/perceptual remediation/compensation, psychosocial skills training, energy conservation, coping strategies training, and DME and/or AE instructions  RECOMMENDED OTHER SERVICES: NA  CONSULTED AND AGREED WITH PLAN OF CARE: Patient  PLAN FOR NEXT SESSION: begin cognitive tasks (word puzzles/brain games), address reaction time and visual scanning/attention, review energy conservation strategies.   Chelsey Redondo, [place], OTR/L 03/08/2022, 8:05 AM

## 2022-03-08 NOTE — Therapy (Signed)
OUTPATIENT PHYSICAL THERAPY NEURO TREATMENT and D/C Summary   Patient Name: Randy [name] MRN: [id number] DOB:[birth year], 81 y.o., male Today's Date: 03/08/2022   PCP: Dorothyann Peng, NP REFERRING PROVIDER: Judith Part, MD PHYSICAL THERAPY DISCHARGE SUMMARY  Visits from Start of Care: 3  Current functional level related to goals / functional outcomes: Pt able to meet majority of STG/LTG   Remaining deficits: Still exhibits some deficits in gait speed   Education / Equipment: HEP/walking program   Patient agrees to discharge. Patient goals were partially met. Patient is being discharged due to being pleased with the current functional level.    PT End of Session - 03/08/22 0852     Visit Number 3    Number of Visits 4    Date for PT Re-Evaluation 04/01/22    Authorization Type [employer]    PT Start Time 0845    PT Stop Time 0930    PT Time Calculation (min) 45 min    Activity Tolerance Patient tolerated treatment well    Behavior During Therapy [facility] for tasks assessed/performed             Past Medical History:  Diagnosis Date   Depression    2022   Diabetes mellitus    Type II   Hypertension    NPH (normal pressure hydrocephalus) (HCC)    OSA (obstructive sleep apnea) 11/24/2016   Past Surgical History:  Procedure Laterality Date   CATARACT EXTRACTION, BILATERAL Bilateral 2019   COLONOSCOPY  07/06/2011   Procedure: COLONOSCOPY;  Surgeon: Winfield Cunas., MD;  Location: [facility] ENDOSCOPY;  Service: Endoscopy;  Laterality: N/A;   ESOPHAGOGASTRODUODENOSCOPY  07/05/2011   Procedure: ESOPHAGOGASTRODUODENOSCOPY (EGD);  Surgeon: Landry Dyke, MD;  Location: [facility] ENDOSCOPY;  Service: Endoscopy;  Laterality: N/A;   EYE SURGERY     FRACTURE SURGERY     right wrist   PLACEMENT OF LUMBAR DRAIN N/A 08/17/2021   Procedure: PLACEMENT OF LUMBAR DRAIN;  Surgeon: Judith Part, MD;  Location: [place];  Service: Neurosurgery;  Laterality: N/A;  3C/RM 19    SHUNT REVISION Right 02/13/2022   Procedure: RIGHT LUMBOPERITONEAL SHUNT REVISION;  Surgeon: Judith Part, MD;  Location: [place];  Service: Neurosurgery;  Laterality: Right;   VENTRICULOPERITONEAL SHUNT Right 12/16/2021   Procedure: Lumboperitoneal shunt placement;  Surgeon: Judith Part, MD;  Location: [place];  Service: Neurosurgery;  Laterality: Right;   Patient Active Problem List   Diagnosis Date Noted   NPH (normal pressure hydrocephalus) (HCC) 12/16/2021   Normal pressure hydrocephalus ([place]) 08/17/2021   Altered mental status    Encephalopathy 07/29/2021   Dyspnea on exertion 03/18/2020   PVC (premature ventricular contraction) 03/18/2020   Acquired hallux limitus of both feet 06/12/2019   Sleep related hypoxia 01/09/2017   Periodic limb movements of sleep 01/09/2017   OSA (obstructive sleep apnea) 11/24/2016   Upper GI bleed 07/06/2011   GI bleeding 07/05/2011   Anemia associated with acute blood loss 07/05/2011   DM (diabetes mellitus), type 2 ([place]) 07/05/2011   Diabetes mellitus with coincident hypertension ([place]) 07/05/2011   Hyperlipidemia 07/05/2011    ONSET DATE: 02/11/22  REFERRING DIAG: G93.40 (ICD-10-CM) - Encephalopathy   THERAPY DIAG:  No diagnosis found.  Rationale for Evaluation and Treatment Rehabilitation  SUBJECTIVE:  SUBJECTIVE STATEMENT: "Feel like the issues I have won't be solved by therapy" Pt accompanied by: self  PERTINENT HISTORY: recent shunt placement and revision  PAIN:  Are you having pain? No, some headaches  PRECAUTIONS: Fall  WEIGHT BEARING RESTRICTIONS No  FALLS: Has patient fallen in last 6 months? Yes. Number of falls 1  LIVING ENVIRONMENT: Lives with: lives with their spouse Lives in: House/apartment Stairs: Yes: Internal: 12  steps; can reach both and External: 3-4, 2 steps; on right going up Has following equipment at home: Single point cane, has ground-floor set-up  PLOF: Independent  PATIENT GOALS "stamina is lacking"  OBJECTIVE:   TODAY'S TREATMENT: 03/08/22 Activity Comments  Pt education on sustained physical activity and cardiovascular exercise   Dynamic Gait Index 17/24  2MWT  350 ft = 2.9 ft/sec              TODAY'S TREATMENT: 03/03/22 Activity Comments  Gait training -Obstacle course: fig 8, over hurdles, unexpected zig-zag around chair over 40 ft distance completing 6 rounds -retrowalking: cues for increased hip ext  Sit-stand to cone taps 1x10 ipsilat, 1x10 contralat  Standing on foam EO/EC 3x15 sec Head turns EO/EC 1x5  Tandem stance X 30 sec           DIAGNOSTIC FINDINGS:   COGNITION: Overall cognitive status: Impaired   SENSATION: WFL  COORDINATION: WNL: rapid, alternating foot/hand tapping. Heel to shin, finger to nose    MUSCLE TONE: WNL     POSTURE: rounded shoulders and forward head  LOWER EXTREMITY ROM:     Active  Right Eval Left Eval  Hip flexion    Hip extension    Hip abduction    Hip adduction    Hip internal rotation    Hip external rotation    Knee flexion    Knee extension    Ankle dorsiflexion    Ankle plantarflexion    Ankle inversion    Ankle eversion     (Blank rows = not tested)  LOWER EXTREMITY MMT:    MMT Right Eval Left Eval  Hip flexion    Hip extension    Hip abduction    Hip adduction    Hip internal rotation    Hip external rotation    Knee flexion    Knee extension    Ankle dorsiflexion    Ankle plantarflexion    Ankle inversion    Ankle eversion    (Blank rows = not tested) Seated resisted tests grossly 5/5  BED MOBILITY:    TRANSFERS: Independent  RAMP:    CURB:    STAIRS:  Level of Assistance: Modified independence  Stair Negotiation Technique: Step to Pattern Alternating Pattern  with No  Rails Bilateral Rails  Demo lateral LOB when attempting stairs w/out HR  GAIT: Gait pattern: decreased stride length Distance walked: 265 Assistive device utilized: None Level of assistance: Complete Independence Comments: decreased velocity  FUNCTIONAL TESTs:  5 times sit to stand: 12.78 sec Timed up and go (TUG): 12.58 sec Berg Balance Scale: 52/56 Dynamic Gait Index: 13/24 2MWT: 265 ft (age-match cohort average 472 ft) Gait speed: 2.2 ft/sec    TODAY'S TREATMENT:     PATIENT EDUCATION: Education details: assessment findings Person educated: Patient Education method: Explanation Education comprehension: verbalized understanding   HOME EXERCISE PROGRAM: To be initiated    GOALS: Goals reviewed with patient? Yes  SHORT TERM GOALS: Target date: 03/11/2022  Report/demonstrate consistent walking program for HEP Baseline: Goal status: MET  2.  Independent with balance HEP to improve safety with ambulation Baseline:  Goal status: MET   LONG TERM GOALS: Target date: 04/01/2022  Manifest improve safety with gait as evidenced by score 19/24 DGI Baseline: 13/24; 17/24 Goal status: NOT MET  2.  Manifest improved gait tolerance/speed per distance of 350 ft 2MWT Baseline: 265 ft approx 2.2 ft/sec; (03/08/22) 350 ft approx 2.9 ft/sec Goal status: MET    ASSESSMENT:  CLINICAL IMPRESSION: Pt able to demonstrate improved gait speed per distance of 350 ft during 2MWT and improved score on Dynamic Gait Index from initial 13/24 to 17.  Pt reports feeling at a level consistent with his baseline and is please with current functional status and reports that he can manage on his own.  Therapist encourages more consistent walking routine and to increase speed/stride   OBJECTIVE IMPAIRMENTS Abnormal gait, decreased activity tolerance, decreased balance, decreased endurance, difficulty walking, and postural dysfunction.   ACTIVITY LIMITATIONS standing, stairs, and locomotion  level  PARTICIPATION LIMITATIONS: shopping, community activity, and yard work  Lynd Age and Time since onset of injury/illness/exacerbation are also affecting patient's functional outcome.   REHAB POTENTIAL: Good  CLINICAL DECISION MAKING: Evolving/moderate complexity  EVALUATION COMPLEXITY: Moderate  PLAN: PT FREQUENCY: 1x/week  PT DURATION: 4 weeks  PLANNED INTERVENTIONS: Therapeutic exercises, Therapeutic activity, Neuromuscular re-education, Balance training, Gait training, Patient/Family education, Self Care, Joint mobilization, Stair training, Vestibular training, Canalith repositioning, DME instructions, and Aquatic Therapy  PLAN FOR NEXT SESSION: D/C   Toniann Fail, PT 03/08/2022, 8:53 AM

## 2022-03-10 ENCOUNTER — Ambulatory Visit (HOSPITAL_COMMUNITY): Payer: Self-pay | Admitting: Student in an Organized Health Care Education/Training Program

## 2022-03-10 DIAGNOSIS — Z6826 Body mass index (BMI) 26.0-26.9, adult: Secondary | ICD-10-CM | POA: Diagnosis not present

## 2022-03-10 DIAGNOSIS — G912 (Idiopathic) normal pressure hydrocephalus: Secondary | ICD-10-CM | POA: Diagnosis not present

## 2022-03-11 ENCOUNTER — Telehealth: Payer: Self-pay | Admitting: Adult Health

## 2022-03-11 NOTE — Telephone Encounter (Signed)
[employer] Rep dropped off forms for this pt to be signed and faxed back. [phone]  Form has been placed in red folder.  Please advise.

## 2022-03-16 ENCOUNTER — Encounter: Payer: Medicare PPO | Admitting: Occupational Therapy

## 2022-03-16 ENCOUNTER — Ambulatory Visit: Payer: Medicare PPO | Admitting: Physical Therapy

## 2022-03-16 NOTE — Telephone Encounter (Signed)
[employer] rep advised that respiratory need to come from pulmonology. Rep verbalized understanding.

## 2022-03-21 ENCOUNTER — Institutional Professional Consult (permissible substitution): Payer: Medicare PPO | Admitting: Internal Medicine

## 2022-03-22 ENCOUNTER — Encounter: Payer: Medicare PPO | Admitting: Occupational Therapy

## 2022-03-22 ENCOUNTER — Ambulatory Visit: Payer: Medicare PPO

## 2022-03-22 ENCOUNTER — Ambulatory Visit (INDEPENDENT_AMBULATORY_CARE_PROVIDER_SITE_OTHER): Payer: Medicare PPO

## 2022-03-22 VITALS — Ht 68.0 in | Wt 170.0 lb

## 2022-03-22 DIAGNOSIS — Z0000 Encounter for general adult medical examination without abnormal findings: Secondary | ICD-10-CM

## 2022-03-22 NOTE — Progress Notes (Signed)
I connected with Randy [name] today by telephone and verified that I am speaking with the correct person using two identifiers. Location patient: home Location provider: work Persons participating in the virtual visit: Barron Kristin, [place] LPN.   I discussed the limitations, risks, security and privacy concerns of performing an evaluation and management service by telephone and the availability of in person appointments. I also discussed with the patient that there may be a patient responsible charge related to this service. The patient expressed understanding and verbally consented to this telephonic visit.    Interactive audio and video telecommunications were attempted between this provider and patient, however failed, due to patient having technical difficulties OR patient did not have access to video capability.  We continued and completed visit with audio only.     Vital signs may be patient reported or missing.  Subjective:   Randy [name] is a 81 y.o. male who presents for Medicare Annual/Subsequent preventive examination.  Review of Systems     Cardiac Risk Factors include: advanced age (>16mn, >>18women);diabetes mellitus;dyslipidemia;hypertension;male gender     Objective:    Today's Vitals   03/22/22 1226  Weight: 170 lb (77.1 kg)  Height: 5' 8"  (1.727 m)   Body mass index is 25.85 kg/m.     03/22/2022   12:32 PM 02/25/2022    8:05 AM 02/11/2022    7:36 PM 02/09/2022    3:21 PM 12/13/2021   10:20 AM 08/17/2021    5:00 PM 08/17/2021   10:45 AM  Advanced Directives  Does Patient Have a Medical Advance Directive? Yes Yes No No Yes  Yes  Type of [place] will Living will;Healthcare Power of [place] will  Does patient want to make changes to medical advance directive?  No - Patient declined   Yes (MAU/Ambulatory/Procedural Areas - Information given) No - Patient declined    Copy of [place] Chart? Yes - validated most recent copy scanned in chart (See row information)      No - copy requested  Would patient like information on creating a medical advance directive?   No - Patient declined No - Patient declined       Current Medications (verified) Outpatient Encounter Medications as of 03/22/2022  Medication Sig   Blood Glucose Calibration (TRUE METRIX LEVEL 1) Low SOLN USE TO CHECK METER PRIOR TO TESTING SUGAR   Blood Glucose Monitoring Suppl (TRUE METRIX AIR GLUCOSE METER) w/Device KIT 1 each by Does not apply route daily.   carvedilol (COREG) 6.25 MG tablet Take 1 tablet (6.25 mg total) by mouth 2 (two) times daily with a meal. (Patient taking differently: Take 6.25 mg by mouth 2 (two) times daily as needed (systolic blood pressure >>749).)   Cholecalciferol (VITAMIN D-3 PO) Take 1 capsule by mouth in the morning.   citalopram (CELEXA) 20 MG tablet Take 1 tablet (20 mg total) by mouth in the morning.   folic acid (FOLVITE) 1 MG tablet TAKE 1 TABLET BY MOUTH DAILY   ibuprofen (ADVIL) 200 MG tablet Take 400 mg by mouth every 8 (eight) hours as needed (for pain.).   lisinopril-hydrochlorothiazide (ZESTORETIC) 20-12.5 MG tablet TAKE ONE TABLET BY MOUTH DAILY (Patient taking differently: Take 1 tablet by mouth daily.)   metFORMIN (GLUCOPHAGE) 1000 MG tablet TAKE 1 TABLET BY MOUTH DAILY (1000 MG TOTAL) WITH BREAKFAST (Patient taking differently: Take 1,000 mg by mouth daily with breakfast.)  Multiple Vitamin (MULTIVITAMIN WITH MINERALS) TABS tablet Take 1 tablet by mouth daily.   thiamine 100 MG tablet Take 1 tablet (100 mg total) by mouth daily.   TRUE METRIX BLOOD GLUCOSE TEST test strip USE TO TEST BLOOD GLUCOSE TWICE DAILY.   TRUEPLUS LANCETS 33G MISC USE TO CHECK BLOOD SUGAR DAILY AND PRN   vitamin B-12 (CYANOCOBALAMIN) 1000 MCG tablet Take 1,000 mcg by mouth See admin instructions. Take one tablet by mouth on Monday, Tuesday, Wednesday,  Thursdays and Fridays only   clobetasol (OLUX) 0.05 % topical foam Apply topically 2 (two) times daily. (Patient not taking: Reported on 02/14/2022)   No facility-administered encounter medications on file as of 03/22/2022.    Allergies (verified) Lipitor [atorvastatin]   History: Past Medical History:  Diagnosis Date   Depression    2022   Diabetes mellitus    Type II   Hypertension    NPH (normal pressure hydrocephalus) (HCC)    OSA (obstructive sleep apnea) 11/24/2016   Past Surgical History:  Procedure Laterality Date   CATARACT EXTRACTION, BILATERAL Bilateral 2019   COLONOSCOPY  07/06/2011   Procedure: COLONOSCOPY;  Surgeon: Winfield Cunas., MD;  Location: [facility] ENDOSCOPY;  Service: Endoscopy;  Laterality: N/A;   ESOPHAGOGASTRODUODENOSCOPY  07/05/2011   Procedure: ESOPHAGOGASTRODUODENOSCOPY (EGD);  Surgeon: Landry Dyke, MD;  Location: [facility] ENDOSCOPY;  Service: Endoscopy;  Laterality: N/A;   EYE SURGERY     FRACTURE SURGERY     right wrist   PLACEMENT OF LUMBAR DRAIN N/A 08/17/2021   Procedure: PLACEMENT OF LUMBAR DRAIN;  Surgeon: Judith Part, MD;  Location: [place];  Service: Neurosurgery;  Laterality: N/A;  3C/RM 19   SHUNT REVISION Right 02/13/2022   Procedure: RIGHT LUMBOPERITONEAL SHUNT REVISION;  Surgeon: Judith Part, MD;  Location: [place];  Service: Neurosurgery;  Laterality: Right;   VENTRICULOPERITONEAL SHUNT Right 12/16/2021   Procedure: Lumboperitoneal shunt placement;  Surgeon: Judith Part, MD;  Location: [place];  Service: Neurosurgery;  Laterality: Right;   Family History  Problem Relation Age of Onset   Brain cancer Mother    Hypertension Father    Diabetes Father    Stroke Father    Social History   Socioeconomic History   Marital status: Married    Spouse name: Randy [name]   Number of children: 3   Years of education: Not on file   Highest education level: Not on file  Occupational History   Occupation: retired    Comment: Tour manager  Tobacco Use   Smoking status: Former    Packs/day: 1.00    Years: 30.00    Total pack years: 30.00    Types: Cigarettes    Quit date: 07/03/2001    Years since quitting: 20.7   Smokeless tobacco: Former    Types: Chew    Quit date: 07/04/1991  Vaping Use   Vaping Use: Never used  Substance and Sexual Activity   Alcohol use: Yes    Alcohol/week: 18.0 standard drinks of alcohol    Types: 4 Glasses of wine, 14 Shots of liquor per week    Comment: about 2 drinks (wine, beer, or liquor)   Drug use: No   Sexual activity: Yes  Other Topics Concern   Not on file  Social History Narrative   Retired - Management consultant plants    Married       He likes to play golf. He likes to read and work in the yard.  Social Determinants of Health   Financial Resource Strain: Low Risk  (03/22/2022)   Overall Financial Resource Strain (CARDIA)    Difficulty of Paying Living Expenses: Not hard at all  Food Insecurity: No Food Insecurity (03/22/2022)   Hunger Vital Sign    Worried About Running Out of Food in the Last Year: Never true    Ran Out of Food in the Last Year: Never true  Transportation Needs: No Transportation Needs (03/22/2022)   PRAPARE - Hydrologist (Medical): No    Lack of Transportation (Non-Medical): No  Physical Activity: Sufficiently Active (03/22/2022)   Exercise Vital Sign    Days of Exercise per Week: 7 days    Minutes of Exercise per Session: 30 min  Stress: No Stress Concern Present (03/22/2022)   [place]    Feeling of Stress : Not at all  Social Connections: Moderately Isolated (03/10/2021)   Social Connection and Isolation Panel [NHANES]    Frequency of Communication with Friends and Family: Three times a week    Frequency of Social Gatherings with Friends and Family: Once a week    Attends Religious Services: Never    Marine scientist or Organizations:  No    Attends Music therapist: Never    Marital Status: Married    Tobacco Counseling Counseling given: Not Answered   Clinical Intake:  Pre-visit preparation completed: Yes  Pain : No/denies pain     Nutritional Status: BMI 25 -29 Overweight Nutritional Risks: None Diabetes: Yes  How often do you need to have someone help you when you read instructions, pamphlets, or other written materials from your doctor or pharmacy?: 1 - Never What is the last grade level you completed in school?: 4 yr graduate  Diabetic? Yes Nutrition Risk Assessment:  Has the patient had any N/V/D within the last 2 months?  No  Does the patient have any non-healing wounds?  No  Has the patient had any unintentional weight loss or weight gain?  No   Diabetes:  Is the patient diabetic?  Yes  If diabetic, was a CBG obtained today?  No  Did the patient bring in their glucometer from home?  No  How often do you monitor your CBG's? Once weekly.   Financial Strains and Diabetes Management:  Are you having any financial strains with the device, your supplies or your medication? No .  Does the patient want to be seen by Chronic Care Management for management of their diabetes?  No  Would the patient like to be referred to a Nutritionist or for Diabetic Management?  No   Diabetic Exams:  Diabetic Eye Exam: Completed 06/23/2021 Diabetic Foot Exam: Completed 04/19/2021   Interpreter Needed?: No  Information entered by :: NAllen LPN   Activities of Daily Living    03/22/2022   12:37 PM 02/11/2022    7:36 PM  In your present state of health, do you have any difficulty performing the following activities:  Hearing? 1 1  Comment has one hearing aid   Vision? 0 0  Difficulty concentrating or making decisions? 1 1  Walking or climbing stairs? 1 1  Dressing or bathing? 0 0  Doing errands, shopping? 1 0  Comment does not drive   Preparing Food and eating ? N   Using the Toilet? N    In the past six months, have you accidently leaked urine? Y   Do  you have problems with loss of bowel control? Y   Managing your Medications? Y   Comment wife sets up in pill box   Managing your Finances? Y   Housekeeping or managing your Housekeeping? N     Patient Care Team: Dorothyann Peng, NP as PCP - General (Family Medicine) Werner Lean, MD as PCP - Cardiology (Cardiology) Lavonna Monarch, MD (Inactive) as Consulting Physician (Dermatology)  Indicate any recent Medical Services you may have received from other than [facility] providers in the past year (date may be approximate).     Assessment:   This is a routine wellness examination for Zhaire.  Hearing/Vision screen Vision Screening - Comments:: Regular eye exams,  Dietary issues and exercise activities discussed: Current Exercise Habits: Home exercise routine, Type of exercise: walking, Time (Minutes): 30, Frequency (Times/Week): 7, Weekly Exercise (Minutes/Week): 210   Goals Addressed             This Visit's Progress    Patient Stated       03/22/2022, no goals       Depression Screen    03/22/2022   12:34 PM 02/11/2022   10:06 AM 03/10/2021    1:08 PM 03/10/2021    1:07 PM 04/07/2020    5:00 PM 04/07/2020    3:02 PM 03/31/2020    1:33 PM  PHQ 2/9 Scores  PHQ - 2 Score 1 6 0 0 1 0 2  PHQ- 9 Score  21   9 9 8     Fall Risk    03/22/2022   12:32 PM 04/20/2021   12:47 PM 03/10/2021    1:10 PM 03/31/2020    1:31 PM 12/07/2018   12:43 PM  Fall Risk   Falls in the past year? 1 1 1  0 1  Comment lost balance picking up box      Number falls in past yr: 0 1 1 0 0  Injury with Fall? 0 0 0 0 0  Risk for fall due to : Impaired balance/gait;Medication side effect  Impaired vision;Impaired balance/gait;Impaired mobility History of fall(s) Impaired balance/gait  Follow up Falls evaluation completed;Education provided;Falls prevention discussed  Falls prevention discussed Falls evaluation completed;Falls prevention  discussed Falls evaluation completed;Education provided;Falls prevention discussed    FALL RISK PREVENTION PERTAINING TO THE HOME:  Any stairs in or around the home? Yes  If so, are there any without handrails? No  Home free of loose throw rugs in walkways, pet beds, electrical cords, etc? Yes  Adequate lighting in your home to reduce risk of falls? Yes   ASSISTIVE DEVICES UTILIZED TO PREVENT FALLS:  Life alert? No  Use of a cane, walker or w/c? Yes  Grab bars in the bathroom? Yes  Shower chair or bench in shower? Yes  Elevated toilet seat or a handicapped toilet? No   TIMED UP AND GO:  Was the test performed? No .      Cognitive Function:    11/21/2019   11:54 AM  MMSE - Mini Mental State Exam  Orientation to time 5  Orientation to Place 5  Registration 3  Attention/ Calculation 5  Recall 1  Language- name 2 objects 2  Language- repeat 1  Language- follow 3 step command 3  Language- read & follow direction 1  Write a sentence 1  Copy design 1  Total score 28        03/22/2022   12:40 PM 03/10/2021    1:13 PM 03/31/2020    1:43 PM  6CIT Screen  What Year? 0 points 0 points 0 points  What month? 0 points 0 points 0 points  What time? 0 points 0 points 0 points  Count back from 20 0 points 0 points 0 points  Months in reverse 4 points 0 points 0 points  Repeat phrase 0 points 0 points 0 points  Total Score 4 points 0 points 0 points    Immunizations Immunization History  Administered Date(s) Administered   Fluad Quad(high Dose 65+) 04/30/2019, 05/19/2020, 04/21/2021   Influenza, High Dose Seasonal PF 04/11/2013, 05/08/2015, 04/20/2016, 04/12/2017, 04/10/2018   Influenza,inj,Quad PF,6+ Mos 05/01/2014   PFIZER(Purple Top)SARS-COV-2 Vaccination 07/25/2019, 08/15/2019, 05/16/2020   Pneumococcal Conjugate-13 12/19/2013   Pneumococcal Polysaccharide-23 04/22/2005, 05/08/2015   Tdap 04/11/2013    TDAP status: Up to date  Flu Vaccine status: Due, Education  has been provided regarding the importance of this vaccine. Advised may receive this vaccine at local pharmacy or Health Dept. Aware to provide a copy of the vaccination record if obtained from local pharmacy or Health Dept. Verbalized acceptance and understanding.  Pneumococcal vaccine status: Up to date  Covid-19 vaccine status: Completed vaccines  Qualifies for Shingles Vaccine? Yes   Zostavax completed No   Shingrix Completed?: No.    Education has been provided regarding the importance of this vaccine. Patient has been advised to call insurance company to determine out of pocket expense if they have not yet received this vaccine. Advised may also receive vaccine at local pharmacy or Health Dept. Verbalized acceptance and understanding.  Screening Tests Health Maintenance  Topic Date Due   Zoster Vaccines- Shingrix (1 of 2) Never done   Diabetic kidney evaluation - Urine ACR  11/08/2018   COVID-19 Vaccine (4 - Pfizer risk series) 07/11/2020   INFLUENZA VACCINE  02/01/2022   FOOT EXAM  04/19/2022   HEMOGLOBIN A1C  06/14/2022   OPHTHALMOLOGY EXAM  06/23/2022   Diabetic kidney evaluation - GFR measurement  02/12/2023   TETANUS/TDAP  04/12/2023   Pneumonia Vaccine 48+ Years old  Completed   HPV VACCINES  Aged Out    Health Maintenance  Health Maintenance Due  Topic Date Due   Zoster Vaccines- Shingrix (1 of 2) Never done   Diabetic kidney evaluation - Urine ACR  11/08/2018   COVID-19 Vaccine (4 - Pfizer risk series) 07/11/2020   INFLUENZA VACCINE  02/01/2022    Colorectal cancer screening: No longer required.   Lung Cancer Screening: (Low Dose CT Chest recommended if Age 41-80 years, 30 pack-year currently smoking OR have quit w/in 15years.) does not qualify.   Lung Cancer Screening Referral: no  Additional Screening:  Hepatitis C Screening: does not qualify;   Vision Screening: Recommended annual ophthalmology exams for early detection of glaucoma and other disorders of  the eye. Is the patient up to date with their annual eye exam?  Yes  Who is the provider or what is the name of the office in which the patient attends annual eye exams? Can't remember name If pt is not established with a provider, would they like to be referred to a provider to establish care? No .   Dental Screening: Recommended annual dental exams for proper oral hygiene  Community Resource Referral / Chronic Care Management: CRR required this visit?  No   CCM required this visit?  No      Plan:     I have personally reviewed and noted the following in the patient's chart:   Medical and social history Use  of alcohol, tobacco or illicit drugs  Current medications and supplements including opioid prescriptions. Patient is not currently taking opioid prescriptions. Functional ability and status Nutritional status Physical activity Advanced directives List of other physicians Hospitalizations, surgeries, and ER visits in previous 12 months Vitals Screenings to include cognitive, depression, and falls Referrals and appointments  In addition, I have reviewed and discussed with patient certain preventive protocols, quality metrics, and best practice recommendations. A written personalized care plan for preventive services as well as general preventive health recommendations were provided to patient.     Kellie Simmering, LPN   [id number]   Nurse Notes: none  Due to this being a virtual visit, the after visit summary with patients personalized plan was offered to patient via mail or my-chart.  Patient would like to access on my-chart

## 2022-03-22 NOTE — Patient Instructions (Signed)
Randy [name] , Thank you for taking time to come for your Medicare Wellness Visit. I appreciate your ongoing commitment to your health goals. Please review the following plan we discussed and let me know if I can assist you in the future.   Screening recommendations/referrals: Colonoscopy: not required Recommended yearly ophthalmology/optometry visit for glaucoma screening and checkup Recommended yearly dental visit for hygiene and checkup  Vaccinations: Influenza vaccine: due Pneumococcal vaccine: completed 05/08/2015 Tdap vaccine: completed 04/11/2013, due 04/12/2023 Shingles vaccine: discussed Covid-19:  05/16/2020, 08/15/2019, 07/25/2019  Advanced directives: copy in chart  Conditions/risks identified: none  Next appointment: Follow up in one year for your annual wellness visit.   Preventive Care 42 Years and Older, Male Preventive care refers to lifestyle choices and visits with your health care provider that can promote health and wellness. What does preventive care include? A yearly physical exam. This is also called an annual well check. Dental exams once or twice a year. Routine eye exams. Ask your health care provider how often you should have your eyes checked. Personal lifestyle choices, including: Daily care of your teeth and gums. Regular physical activity. Eating a healthy diet. Avoiding tobacco and drug use. Limiting alcohol use. Practicing safe sex. Taking low doses of aspirin every day. Taking vitamin and mineral supplements as recommended by your health care provider. What happens during an annual well check? The services and screenings done by your health care provider during your annual well check will depend on your age, overall health, lifestyle risk factors, and family history of disease. Counseling  Your health care provider may ask you questions about your: Alcohol use. Tobacco use. Drug use. Emotional well-being. Home and relationship  well-being. Sexual activity. Eating habits. History of falls. Memory and ability to understand (cognition). Work and work Statistician. Screening  You may have the following tests or measurements: Height, weight, and BMI. Blood pressure. Lipid and cholesterol levels. These may be checked every 5 years, or more frequently if you are over 42 years old. Skin check. Lung cancer screening. You may have this screening every year starting at age 80 if you have a 30-pack-year history of smoking and currently smoke or have quit within the past 15 years. Fecal occult blood test (FOBT) of the stool. You may have this test every year starting at age 11. Flexible sigmoidoscopy or colonoscopy. You may have a sigmoidoscopy every 5 years or a colonoscopy every 10 years starting at age 13. Prostate cancer screening. Recommendations will vary depending on your family history and other risks. Hepatitis C blood test. Hepatitis B blood test. Sexually transmitted disease (STD) testing. Diabetes screening. This is done by checking your blood sugar (glucose) after you have not eaten for a while (fasting). You may have this done every 1-3 years. Abdominal aortic aneurysm (AAA) screening. You may need this if you are a current or former smoker. Osteoporosis. You may be screened starting at age 43 if you are at high risk. Talk with your health care provider about your test results, treatment options, and if necessary, the need for more tests. Vaccines  Your health care provider may recommend certain vaccines, such as: Influenza vaccine. This is recommended every year. Tetanus, diphtheria, and acellular pertussis (Tdap, Td) vaccine. You may need a Td booster every 10 years. Zoster vaccine. You may need this after age 70. Pneumococcal 13-valent conjugate (PCV13) vaccine. One dose is recommended after age 41. Pneumococcal polysaccharide (PPSV23) vaccine. One dose is recommended after age 46. Talk to your health  care  provider about which screenings and vaccines you need and how often you need them. This information is not intended to replace advice given to you by your health care provider. Make sure you discuss any questions you have with your health care provider. Document Released: 07/17/2015 Document Revised: 03/09/2016 Document Reviewed: 04/21/2015 Elsevier Interactive Patient Education  2017 [employer] Prevention in the Home Falls can cause injuries. They can happen to people of all ages. There are many things you can do to make your home safe and to help prevent falls. What can I do on the outside of my home? Regularly fix the edges of walkways and driveways and fix any cracks. Remove anything that might make you trip as you walk through a door, such as a raised step or threshold. Trim any bushes or trees on the path to your home. Use bright outdoor lighting. Clear any walking paths of anything that might make someone trip, such as rocks or tools. Regularly check to see if handrails are loose or broken. Make sure that both sides of any steps have handrails. Any raised decks and porches should have guardrails on the edges. Have any leaves, snow, or ice cleared regularly. Use sand or salt on walking paths during winter. Clean up any spills in your garage right away. This includes oil or grease spills. What can I do in the bathroom? Use night lights. Install grab bars by the toilet and in the tub and shower. Do not use towel bars as grab bars. Use non-skid mats or decals in the tub or shower. If you need to sit down in the shower, use a plastic, non-slip stool. Keep the floor dry. Clean up any water that spills on the floor as soon as it happens. Remove soap buildup in the tub or shower regularly. Attach bath mats securely with double-sided non-slip rug tape. Do not have throw rugs and other things on the floor that can make you trip. What can I do in the bedroom? Use night lights. Make  sure that you have a light by your bed that is easy to reach. Do not use any sheets or blankets that are too big for your bed. They should not hang down onto the floor. Have a firm chair that has side arms. You can use this for support while you get dressed. Do not have throw rugs and other things on the floor that can make you trip. What can I do in the kitchen? Clean up any spills right away. Avoid walking on wet floors. Keep items that you use a lot in easy-to-reach places. If you need to reach something above you, use a strong step stool that has a grab bar. Keep electrical cords out of the way. Do not use floor polish or wax that makes floors slippery. If you must use wax, use non-skid floor wax. Do not have throw rugs and other things on the floor that can make you trip. What can I do with my stairs? Do not leave any items on the stairs. Make sure that there are handrails on both sides of the stairs and use them. Fix handrails that are broken or loose. Make sure that handrails are as long as the stairways. Check any carpeting to make sure that it is firmly attached to the stairs. Fix any carpet that is loose or worn. Avoid having throw rugs at the top or bottom of the stairs. If you do have throw rugs, attach them to  the floor with carpet tape. Make sure that you have a light switch at the top of the stairs and the bottom of the stairs. If you do not have them, ask someone to add them for you. What else can I do to help prevent falls? Wear shoes that: Do not have high heels. Have rubber bottoms. Are comfortable and fit you well. Are closed at the toe. Do not wear sandals. If you use a stepladder: Make sure that it is fully opened. Do not climb a closed stepladder. Make sure that both sides of the stepladder are locked into place. Ask someone to hold it for you, if possible. Clearly mark and make sure that you can see: Any grab bars or handrails. First and last steps. Where the  edge of each step is. Use tools that help you move around (mobility aids) if they are needed. These include: Canes. Walkers. Scooters. Crutches. Turn on the lights when you go into a dark area. Replace any light bulbs as soon as they burn out. Set up your furniture so you have a clear path. Avoid moving your furniture around. If any of your floors are uneven, fix them. If there are any pets around you, be aware of where they are. Review your medicines with your doctor. Some medicines can make you feel dizzy. This can increase your chance of falling. Ask your doctor what other things that you can do to help prevent falls. This information is not intended to replace advice given to you by your health care provider. Make sure you discuss any questions you have with your health care provider. Document Released: 04/16/2009 Document Revised: 11/26/2015 Document Reviewed: 07/25/2014 Elsevier Interactive Patient Education  2017 [employer].

## 2022-03-23 ENCOUNTER — Ambulatory Visit (HOSPITAL_BASED_OUTPATIENT_CLINIC_OR_DEPARTMENT_OTHER): Payer: Medicare PPO | Admitting: Student in an Organized Health Care Education/Training Program

## 2022-03-23 ENCOUNTER — Encounter (HOSPITAL_COMMUNITY): Payer: Self-pay | Admitting: Student in an Organized Health Care Education/Training Program

## 2022-03-23 ENCOUNTER — Ambulatory Visit (INDEPENDENT_AMBULATORY_CARE_PROVIDER_SITE_OTHER): Payer: Medicare PPO

## 2022-03-23 ENCOUNTER — Ambulatory Visit: Payer: Medicare PPO

## 2022-03-23 VITALS — BP 176/69 | HR 61 | Resp 20 | Wt 174.0 lb

## 2022-03-23 DIAGNOSIS — G912 (Idiopathic) normal pressure hydrocephalus: Secondary | ICD-10-CM | POA: Diagnosis not present

## 2022-03-23 DIAGNOSIS — Z23 Encounter for immunization: Secondary | ICD-10-CM | POA: Diagnosis not present

## 2022-03-23 DIAGNOSIS — F331 Major depressive disorder, recurrent, moderate: Secondary | ICD-10-CM

## 2022-03-23 MED ORDER — MODAFINIL 100 MG PO TABS: 100.0000 mg | ORAL_TABLET | Freq: Every day | ORAL | 0 refills | Status: AC

## 2022-03-23 NOTE — Progress Notes (Signed)
Psychiatric Initial Adult Assessment   Patient Identification: Randy [name] MRN:  [id number] Date of Evaluation:  03/23/2022 Referral Source: PCP Chief Complaint:   Chief Complaint  Patient presents with   Establish Care   Visit Diagnosis:    ICD-10-CM   1. NPH (normal pressure hydrocephalus) (HCC)  G91.2     2. MDD (major depressive disorder), recurrent episode, moderate (HCC)  F33.1 modafinil (PROVIGIL) 100 MG tablet      History of Present Illness:  Randy [name] "Letitia Caul" Curci is a 81 yo patient w/ PPH of Depression and PMH of NPH w/ shunt, HTN, OSA, and  HLD, and T2DM. On assessment today patient presents with his wife of over 18 years and endorses that he has been taking his Celexa 17m prescribed by his PCP.  Per EMR patient has been taking Celexa since 08/2021 and was recently increased to 257mlast month. Both patient and wife endorse that patient has not improved. Wife reports that patient has been dealing with dysphoric mood that last 2 years, but it has worsened since 1/23. Wife reports that she feel in the past week patient has been appearing to less motivated and stuck in his favorite chair at home. Wife reports that patient has been less motivated to complete tasks that she gives, that he normally would do. Wife reports that he has instead been telling her that he does not feel up to the task and politely as if she can do them.  Patient endorses that he does not have any problems sleeping. Wife reports that she feels the patient is sleeping more than issue. Patient reports that he has also noticed that his attention has been worse, he has low energy, anhedonia, and passive SI. Patient reports that he "does not want to live until I'm [address] this way." Patient reports he would never try to kill himself, and is mostly joking around. Patient adamantly denied HI and AVH. Patient and wife endorse that patient has only had AVH in the hospital.   Patient denies significant anxiety and endorses  that he will sometimes find himself worrying about continue to live with deteriorating quality of life. Patient reports that he does not have any somatization of symptoms, symptoms of social anxiety, nor panic attacks.  Patient and wife deny patient every having any hx of manic episodes. Patient also denies any hx of traumas, or symptoms of PTSD.  Associated Signs/Symptoms: Depression Symptoms:  depressed mood, anhedonia, hypersomnia, fatigue, difficulty concentrating, hopelessness, recurrent thoughts of death, loss of energy/fatigue, (Hypo) Manic Symptoms:   Denies Anxiety Symptoms:   Denies Psychotic Symptoms:   Denies PTSD Symptoms: NA  Past Psychiatric History:  Depression dx by PCP: Celexa [id number]) INPT: denies OUTPT: denies  Previous Psychotropic Medications: Yes   Substance Abuse History in the last 12 months:  No. Etoh: 2 drinks/ night (decided to cont due to quality of life)  Consequences of Substance Abuse: NA  Past Medical History:  Past Medical History:  Diagnosis Date   Depression    2022   Diabetes mellitus    Type II   Hypertension    NPH (normal pressure hydrocephalus) (HCC)    OSA (obstructive sleep apnea) 11/24/2016    Past Surgical History:  Procedure Laterality Date   CATARACT EXTRACTION, BILATERAL Bilateral 2019   COLONOSCOPY  07/06/2011   Procedure: COLONOSCOPY;  Surgeon: JamWinfield CunasMD;  [facility];  Service: Endoscopy;  Laterality: N/A;   ESOPHAGOGASTRODUODENOSCOPY  07/05/2011   Procedure: ESOPHAGOGASTRODUODENOSCOPY (EGD);  Surgeon: Landry Dyke, MD;  Location: [facility] ENDOSCOPY;  Service: Endoscopy;  Laterality: N/A;   EYE SURGERY     FRACTURE SURGERY     right wrist   PLACEMENT OF LUMBAR DRAIN N/A 08/17/2021   Procedure: PLACEMENT OF LUMBAR DRAIN;  Surgeon: Judith Part, MD;  Location: [place];  Service: Neurosurgery;  Laterality: N/A;  3C/RM 19   SHUNT REVISION Right 02/13/2022   Procedure: RIGHT  LUMBOPERITONEAL SHUNT REVISION;  Surgeon: Judith Part, MD;  Location: [place];  Service: Neurosurgery;  Laterality: Right;   VENTRICULOPERITONEAL SHUNT Right 12/16/2021   Procedure: Lumboperitoneal shunt placement;  Surgeon: Judith Part, MD;  Location: [place];  Service: Neurosurgery;  Laterality: Right;    Family Psychiatric History:  Mom: Depression   Family History:  Family History  Problem Relation Age of Onset   Brain cancer Mother    Hypertension Father    Diabetes Father    Stroke Father     Social History:   Social History   Socioeconomic History   Marital status: Married    Spouse name: Ashok Norris   Number of children: 3   Years of education: Not on file   Highest education level: Not on file  Occupational History   Occupation: retired    Comment: Engineer, building services  Tobacco Use   Smoking status: Former    Packs/day: 1.00    Years: 30.00    Total pack years: 30.00    Types: Cigarettes    Quit date: 07/03/2001    Years since quitting: 20.7   Smokeless tobacco: Former    Types: Chew    Quit date: 07/04/1991  Vaping Use   Vaping Use: Never used  Substance and Sexual Activity   Alcohol use: Yes    Alcohol/week: 18.0 standard drinks of alcohol    Types: 4 Glasses of wine, 14 Shots of liquor per week    Comment: about 2 drinks (wine, beer, or liquor)   Drug use: No   Sexual activity: Yes  Other Topics Concern   Not on file  Social History Narrative   Retired - Management consultant plants    Married       He likes to play golf. He likes to read and work in the yard.       Social Determinants of Health   Financial Resource Strain: Low Risk  (03/22/2022)   Overall Financial Resource Strain (CARDIA)    Difficulty of Paying Living Expenses: Not hard at all  Food Insecurity: No Food Insecurity (03/22/2022)   Hunger Vital Sign    Worried About Running Out of Food in the Last Year: Never true    Ran Out of Food in the Last Year: Never true  Transportation  Needs: No Transportation Needs (03/22/2022)   PRAPARE - Hydrologist (Medical): No    Lack of Transportation (Non-Medical): No  Physical Activity: Sufficiently Active (03/22/2022)   Exercise Vital Sign    Days of Exercise per Week: 7 days    Minutes of Exercise per Session: 30 min  Stress: No Stress Concern Present (03/22/2022)   [place]    Feeling of Stress : Not at all  Social Connections: Moderately Isolated (03/10/2021)   Social Connection and Isolation Panel [NHANES]    Frequency of Communication with Friends and Family: Three times a week    Frequency of Social Gatherings with Friends and Family: Once a week  Attends Religious Services: Never    Active Member of Clubs or Organizations: No    Attends Archivist Meetings: Never    Marital Status: Married    Additional Social History:   - lives with wife of 69+ years - Retired Recruitment consultant - Comptroller from [place] 3 daughters, 1 daughter and 1 granddaughter in the area - No longer driving - retired at 81 yo  Allergies:   Allergies  Allergen Reactions   Lipitor [Atorvastatin] Other (See Comments)    Muscle pain    Metabolic Disorder Labs: Lab Results  Component Value Date   HGBA1C 5.8 (H) 12/13/2021   MPG 119.76 12/13/2021   MPG 125.5 07/29/2021   No results found for: "PROLACTIN" Lab Results  Component Value Date   CHOL 181 12/07/2018   TRIG 54.0 12/07/2018   HDL 69.60 12/07/2018   CHOLHDL 3 12/07/2018   VLDL 10.8 12/07/2018   LDLCALC 101 (H) 12/07/2018   LDLCALC 92 11/07/2017   Lab Results  Component Value Date   TSH 1.835 07/30/2021    Therapeutic Level Labs: No results found for: "LITHIUM" No results found for: "CBMZ" No results found for: "VALPROATE"  Current Medications: Current Outpatient Medications  Medication Sig Dispense Refill   modafinil (PROVIGIL) 100 MG tablet Take 1  tablet (100 mg total) by mouth daily. 30 tablet 0   Blood Glucose Calibration (TRUE METRIX LEVEL 1) Low SOLN USE TO CHECK METER PRIOR TO TESTING SUGAR 3 each 0   Blood Glucose Monitoring Suppl (TRUE METRIX AIR GLUCOSE METER) w/Device KIT 1 each by Does not apply route daily. 1 kit 0   carvedilol (COREG) 6.25 MG tablet Take 1 tablet (6.25 mg total) by mouth 2 (two) times daily with a meal. (Patient taking differently: Take 6.25 mg by mouth 2 (two) times daily as needed (systolic blood pressure >267.).) 180 tablet 1   Cholecalciferol (VITAMIN D-3 PO) Take 1 capsule by mouth in the morning.     citalopram (CELEXA) 20 MG tablet Take 1 tablet (20 mg total) by mouth in the morning. 90 tablet 1   clobetasol (OLUX) 0.05 % topical foam Apply topically 2 (two) times daily. (Patient not taking: Reported on 07/28/5807) 50 g 0   folic acid (FOLVITE) 1 MG tablet TAKE 1 TABLET BY MOUTH DAILY 90 tablet 2   ibuprofen (ADVIL) 200 MG tablet Take 400 mg by mouth every 8 (eight) hours as needed (for pain.).     lisinopril-hydrochlorothiazide (ZESTORETIC) 20-12.5 MG tablet TAKE ONE TABLET BY MOUTH DAILY (Patient taking differently: Take 1 tablet by mouth daily.) 90 tablet 3   metFORMIN (GLUCOPHAGE) 1000 MG tablet TAKE 1 TABLET BY MOUTH DAILY (1000 MG TOTAL) WITH BREAKFAST (Patient taking differently: Take 1,000 mg by mouth daily with breakfast.) 90 tablet 1   Multiple Vitamin (MULTIVITAMIN WITH MINERALS) TABS tablet Take 1 tablet by mouth daily. 90 tablet 1   thiamine 100 MG tablet Take 1 tablet (100 mg total) by mouth daily. 90 tablet 1   TRUE METRIX BLOOD GLUCOSE TEST test strip USE TO TEST BLOOD GLUCOSE TWICE DAILY. 200 each 3   TRUEPLUS LANCETS 33G MISC USE TO CHECK BLOOD SUGAR DAILY AND PRN 300 each 0   vitamin B-12 (CYANOCOBALAMIN) 1000 MCG tablet Take 1,000 mcg by mouth See admin instructions. Take one tablet by mouth on Monday, Tuesday, Wednesday, Thursdays and Fridays only     No current facility-administered  medications for this visit.    Musculoskeletal: Strength &  Muscle Tone: within normal limits Gait & Station: shuffle/ slow, but does pick feet up off the floor with each step Patient leans: Front  Psychiatric Specialty Exam: Review of Systems  Psychiatric/Behavioral:  Positive for decreased concentration, dysphoric mood and suicidal ideas. Negative for hallucinations, self-injury and sleep disturbance.     Blood pressure (!) 176/69, pulse 61, resp. rate 20, weight 174 lb (78.9 kg).Body mass index is 26.46 kg/m.  General Appearance: Casual and Fairly Groomed  Eye Contact:  Good  Speech:  Clear and Coherent and Slow  Volume:  Normal  Mood:  Dysphoric  Affect:  Appropriate  Thought Process:  Goal Directed  Orientation:  Full (Time, Place, and Person)  Thought Content:  Logical  Suicidal Thoughts:  No  Homicidal Thoughts:  No  Memory:  Immediate;   Fair Recent;   Poor Remote;   Poor  Judgement:  Good  Insight:  Fair  Psychomotor Activity:  Decreased  Concentration:  Concentration: Good  Recall:  Poor  Fund of Knowledge:Good  Language: Good  Akathisia:  NA  Handed:  Right  AIMS (if indicated):  not done  Assets:  Communication Skills Desire for Improvement Financial Resources/Insurance Housing Intimacy Leisure Time Resilience Social Support Transportation  ADL's:  Intact w/ some mild impairment, wife does pills  Cognition: Impaired,  Moderate  Sleep:  Good   MOCA: 22/30- 9/20 please see media  Screenings: AUDIT    Flowsheet Row Clinical Support from 03/31/2020 in [facility] at [facility]  Alcohol Use Disorder Identification Test Final Score (AUDIT) 4      CAGE-AID    Flowsheet Row ED to Hosp-Admission (Discharged) from 07/29/2021 in [facility] Score 1      [place] Office Visit from 11/21/2019 in [facility]  Total Score (max 30 points ) 28      PHQ2-9    [place] from 03/22/2022 in [facility] at [facility] from 02/11/2022 in [facility] from 03/10/2021 in [facility] at [facility] from 04/07/2020 in [facility] at [facility] from 03/31/2020 in [facility] at [facility] Total Score 1 6 0 1 2  PHQ-9 Total Score -- 21 -- 9 8      Flowsheet Row Admission (Discharged) from 02/11/2022 in [facility] ED from 02/09/2022 in [facility] Admission (Discharged) from 12/16/2021 in [facility] No Risk No Risk No Risk       Assessment and Plan: Anne Fu, patient appears to meet criteria for MDD. However, there is also concern that patient's presentation could be 2/2 to his NPH. Patient also has fairly severe OSA and patient's symptoms of decreased ability staying awake, psychomotor retardation, decreased energy, fatigue. Patient has been using his CPAP and O2 nightly, but there is some concern that he may need an update sleep study and he could be retaining CO2 which could be contributing to his symptoms. Patient did better than expected on his [place], although it does suggest that he has moderate cognitive impairment. Patient concentration and executive function were perfect scores, but he did took a long time to accomplish the task, but did no quit. Patient did have problems with memory, language and attention.  Would recommend that patient have new sleep study, and may want to consider medication for cognitive impairment such as  Namenda or Aricept.  MDD, recurrent, severe 2/2 medical illness NPH OSA - Start Modafinil 139m daily - f/u in 2 weeks - Recommend see Outpatient sleep specialist. Pulmonologist  Collaboration of Care:   Patient/Guardian was advised Release of Information must be obtained prior to any record release in order to  collaborate their care with an outside provider. Patient/Guardian was advised if they have not already done so to contact the registration department to sign all necessary forms in order for [facility] release information regarding their care.   Consent: Patient/Guardian gives verbal consent for treatment and assignment of benefits for services provided during this visit. Patient/Guardian expressed understanding and agreed to proceed.    PGY-3 JFreida Busman MD 9/20/20232:03 PM

## 2022-03-23 NOTE — Patient Instructions (Addendum)
Decrease Celexa to '10mg'$  2. Start Modafinil '100mg'$  daily

## 2022-03-30 ENCOUNTER — Encounter: Payer: Self-pay | Admitting: Podiatry

## 2022-03-30 ENCOUNTER — Ambulatory Visit: Payer: Medicare PPO | Admitting: Podiatry

## 2022-03-30 DIAGNOSIS — B351 Tinea unguium: Secondary | ICD-10-CM

## 2022-03-30 DIAGNOSIS — E1151 Type 2 diabetes mellitus with diabetic peripheral angiopathy without gangrene: Secondary | ICD-10-CM

## 2022-03-30 DIAGNOSIS — M2042 Other hammer toe(s) (acquired), left foot: Secondary | ICD-10-CM

## 2022-03-30 DIAGNOSIS — M205X2 Other deformities of toe(s) (acquired), left foot: Secondary | ICD-10-CM

## 2022-03-30 DIAGNOSIS — M205X1 Other deformities of toe(s) (acquired), right foot: Secondary | ICD-10-CM

## 2022-03-30 DIAGNOSIS — M2041 Other hammer toe(s) (acquired), right foot: Secondary | ICD-10-CM

## 2022-03-30 NOTE — Progress Notes (Signed)
This patient returns to my office for at risk foot care.  This patient requires this care by a professional since this patient will be at risk due to having diabetes.  This patient is unable to cut nails himself since the patient cannot reach his nails.These nails are painful walking and wearing shoes.  This patient presents for at risk foot care today.  General Appearance  Alert, conversant and in no acute stress.  Vascular  Dorsalis pedis and posterior tibial  pulses are palpable  bilaterally.  Capillary return is within normal limits  bilaterally. Temperature is within normal limits  bilaterally.  Neurologic  Senn-Weinstein monofilament wire test diminished  bilaterally. Muscle power within normal limits bilaterally.  Nails Thick disfigured discolored nails with subungual debris  from hallux to fifth toes bilaterally. No evidence of bacterial infection or drainage bilaterally.  Orthopedic  No limitations of motion  feet .  No crepitus or effusions noted.  No bony pathology or digital deformities noted.  Hallux limitus 1st MPJ  B/L.  Midfoot  DJD  B/L.  Skin  normotropic skin with no porokeratosis noted bilaterally.  No signs of infections or ulcers noted.     Onychomycosis  Pain in right toes  Pain in left toes  Consent was obtained for treatment procedures.   Mechanical debridement of nails 1-5  bilaterally performed with a nail nipper.  Filed with dremel without incident.  Patient qualifies for diabetic shoes due to DPN and Hallux limitus  B/L and midfoot DJD.   Return office visit    10 weeks               Told patient to return for periodic foot care and evaluation due to potential at risk complications.   Gardiner Barefoot DPM

## 2022-04-05 NOTE — Progress Notes (Unsigned)
   CC:  imbalance  Follow-up Visit  Last visit: 08/04/21  Brief HPI: 81 year old male with a history of NPH s/p LP shunt, HTN, DM, OSA who follows in clinic for imbalance and memory issues. MRI L-spine was normal. MRI brain with mild-moderate generalized atrophy and mild-moderate ventriculomegaly.  At his last visit he was referred to [facility] for lumbar drain trial.  Interval History: Patient did have improvement with lumbar drain placement, so he underwent LP shunt placement 12/16/21. Was found to have a kinked shunt and underwent revision 02/13/22.  Since his shunt placement, his balance and gait have improved. He does continue to have some instability and recently did PT for his gait as well. Urinary incontinence has improved somewhat but has not fully resolved.  His memory seems to be getting worse. He repeats questions multiple times and forgets appointments. Wife has to leave him notes to tell him where she is, otherwise he will forget. He is showering and preparing meals for himself. Not driving. Wife feels he is on too many medications and is concerned that these are "making him a zombie". Wife also reports he has been feeling down and seems like he has given up. He had a MOCA done with Psychiatry last month where he scored 22/30.   States daughter has noticed a right sided tremor when he is holding his fork. Has not noticed a tremor at rest.  Physical Exam:   Vital Signs: BP (!) 150/56   Pulse 62   Ht '5\' 8"'$  (1.727 m)   Wt 170 lb (77.1 kg)   BMI 25.85 kg/m  GENERAL:  well appearing, in no acute distress, alert  SKIN:  Color, texture, turgor normal. No rashes or lesions HEAD:  Normocephalic/atraumatic. RESP: normal respiratory effort MSK:  No gross joint deformities.   NEUROLOGICAL: Mental Status: Alert, oriented to person, place and time, Follows commands, and Speech fluent and appropriate. Cranial Nerves: PERRL, face symmetric, no dysarthria, hard of hearing Motor: moves all  extremities equally. Mild bradykinesia right finger tapping, no resting tremor Gait: decreased stride length, improved from prior visit  IMPRESSION: 81 year old male with a history of NPH s/p LP shunt, HTN, DM, OSA, depression who presents for follow up of imbalance and memory loss. His gait has improved since shunt placement, but his memory appears to be getting worse. Recent MOCA was 22/30, which is consistent with mild cognitive impairment. Offered referral for neuropsychological testing to better characterize his memory deficits, which he declined at this time. Gait has improved though he does have some persistent decreased stride length. He also has mild bradykinesia on right finger-tapping today, no resting tremor. Will monitor for now and could consider DAT scan or Sinemet trial if symptoms progress. As of now he is not interested in any further testing or medication changes.  PLAN: -Patient is currently not interested in any further testing or medication changes -Next steps: Consider neuropsychological testing, consider Sinemet trial if gait worsens despite LP shunt or if resting tremor develops   Follow-up: 6 months  I spent a total of 41 minutes on the date of the service. Discussed medication side effects, adverse reactions and drug interactions. Written educational materials and patient instructions outlining all of the above were given.  Genia Harold, MD 04/06/22 12:38 PM

## 2022-04-06 ENCOUNTER — Ambulatory Visit: Payer: Medicare PPO | Admitting: Psychiatry

## 2022-04-06 ENCOUNTER — Encounter: Payer: Self-pay | Admitting: Psychiatry

## 2022-04-06 VITALS — BP 150/56 | HR 62 | Ht 68.0 in | Wt 170.0 lb

## 2022-04-06 DIAGNOSIS — G912 (Idiopathic) normal pressure hydrocephalus: Secondary | ICD-10-CM | POA: Diagnosis not present

## 2022-04-06 DIAGNOSIS — G3184 Mild cognitive impairment, so stated: Secondary | ICD-10-CM

## 2022-04-06 NOTE — Patient Instructions (Signed)
A person with mild cognitive impairment (MCI) experiences memory problems greater than normally expected with aging, but not serious enough to interfere with daily activities.  The patient with MCI complains of difficulty with memory. Typically, the complaints include trouble remembering the names of people they met recently, trouble remembering the flow of a conversation, and an increased tendency to misplace things, or similar problems. In many cases, the individual will be aware of these difficulties and will compensate with increased reliance on notes and calendars.   Although there is an increased chances of going on to develop dementia, it is not possible currently to predict with certainty which patients with MCI will or will not go on to develop dementia. There is currently no specific treatment for MCI. People leading sedentary lifestyles are at greater risk for developing dementia. Increased physical activity and brain exercise can help with maintaining brain function.  Tasks to improve attention/working memory 1. Good sleep hygiene (7-8 hrs of sleep) 2. Learning a new skill (Painting, Carpentry, Pottery, new language, Knitting). 3.Cognitive exercises (keep a daily journal, Puzzles) 4. Physical exercise and training  (30 min/day X 4 days week) 5. Being on Antidepressant if needed 6.Yoga, Meditation, Tai Chi 7. Decrease alcohol intake 8.Have a clear schedule and structure in daily routine  MIND Diet: The Lares Diet Intervention for Neurodegenerative Delay, or MIND diet, targets the health of the aging brain. Research participants with the highest MIND diet scores had a significantly slower rate of cognitive decline compared with those with the lowest scores. The effects of the MIND diet on cognition showed greater effects than either the Mediterranean or the DASH diet alone.  The healthy items the MIND diet guidelines suggest include:  3+ servings a day of whole grains 1+  servings a day of vegetables (other than green leafy) 6+ servings a week of green leafy vegetables 5+ servings a week of nuts 4+ meals a week of beans 2+ servings a week of berries 2+ meals a week of poultry 1+ meals a week of fish Mainly olive oil if added fat is used  The unhealthy items, which are higher in saturated and trans fat, include: Less than 5 servings a week of pastries and sweets Less than 4 servings a week of red meat (including beef, pork, lamb, and products made from these meats) Less than one serving a week of cheese and fried foods Less than 1 tablespoon a day of butter/stick margarine

## 2022-04-13 ENCOUNTER — Encounter (HOSPITAL_COMMUNITY): Payer: Self-pay | Admitting: Student in an Organized Health Care Education/Training Program

## 2022-04-13 ENCOUNTER — Ambulatory Visit (HOSPITAL_BASED_OUTPATIENT_CLINIC_OR_DEPARTMENT_OTHER): Payer: Medicare PPO | Admitting: Student in an Organized Health Care Education/Training Program

## 2022-04-13 VITALS — BP 134/68 | HR 89

## 2022-04-13 DIAGNOSIS — F331 Major depressive disorder, recurrent, moderate: Secondary | ICD-10-CM | POA: Diagnosis not present

## 2022-04-13 DIAGNOSIS — G912 (Idiopathic) normal pressure hydrocephalus: Secondary | ICD-10-CM

## 2022-04-13 NOTE — Progress Notes (Signed)
[facility] MD/PA/NP OP Progress Note  04/13/2022 5:06 PM Randy [name]  MRN:  [id number]  Chief Complaint:  Chief Complaint  Patient presents with   Follow-up   HPI:   Bookert "Randy [name]" Bonelli is a 81 yo patient w/ PPH of Depression and PMH of NPH w/ shunt, HTN, OSA, and  HLD, and T2DM. On assessment today patient presents with his wife of over 53 years and endorses that he has been taking his medication regimen:  Modafinil 100 mg daily Celexa 10 mg daily  Patient's wife reports that patient has been sleeping more with modafinil and neither patient nor wife have noticed benefit.  Patient endorses he would like to stop the medication and is interested in decreasing the number of overall pills that he must take.  Patient reports that his mood has been "bad."  Patient's wife endorses the patient has been a bit more grumpy.  Patient reports that his mood has been "bad" because his hearing is declining and he is in constant pain in his side, back and having headaches.  Patient reports he is also noticed his legs have become weaker lately.  Patient reports he also had a fall within the last 2 weeks and wife endorses that she had to call a neighbor to help come pick patient up.  Patient reports that he is slowly coming to the realization that there is no "magic pill" endorses that he is not interested in continuing to get more testing or work-ups.  Provider talks with patient and wife about concern for low SPO2 noted on assessment today.  Provider expressed that patient may benefit from a new sleep study however, patient and patient wife endorse that they actually canceled updated sleep study this year.  Patient reports he is not interested in doing another sleep study but is willing to continue to use his CPAP and oxygen at the current settings.  Patient reports that he has been thinking about his decline and that he does not want to be a burden for his wife to have to care for "24/7."  Patient and provider  discussed patient maximizing the functionality he has now and appreciating what he can still do.  Patient endorses that he believes that this is good advice in a more positive way to approach his decline in function.  Wife reports that she only wanted psychiatry to help patient reached the conclusion that there is no "magic pill" to cure him.  Wife reports that she thinks that patient has slowly come to this conclusion on his own.  Wife endorses that patient's personality tends to make him a bit more stubborn and she is used to this.  Patient reports that he does not feel the need to return for psychiatric appointment as he does not think he is getting much benefit and is not interested in further test or new medication.  Patient denies active SI and does not endorse HI or AVH.  Safety planning was done with patient and patient wife.  Wife endorses knowledge of 75, 61 as well as the behavioral health urgent care in the [facility] geriatric inpatient unit.  Wife endorsed understanding that she is able to call or take patient to these locations in the setting that he should endorse symptoms of psychosis, or attempt to harm himself.  Patient was also involved, provider told patient that should he suddenly have thoughts of wanting to actively harm himself needs or resources at his disposal.  Patient did endorse his wife and his  daughters as protective factors that decrease his risk of suicide.  Patient endorsed that he would prefer to go in a more peaceful manner such as "in a chair with my remote my hand."  Provider did try to convince patient to follow-up at least 1 more time however, patient endorses that he does not think this is necessary.  Wife endorses that she believes patient has made up his mind but they both intend for patient to continue to follow-up with his neurosurgeon.  This provider recommended that patient update neurosurgeon on auditory and visual changes as well as recent fall.  Both endorsed  understanding. Visit Diagnosis:    ICD-10-CM   1. MDD (major depressive disorder), recurrent episode, moderate (HCC)  F33.1     2. NPH (normal pressure hydrocephalus) ([place])  G91.2       Past Psychiatric History: Depression dx by PCP: Celexa 12m(failed) INPT: denies OUTPT: denies   03/2022-started modafinil 100 mg daily  Past Medical History:  Past Medical History:  Diagnosis Date   Depression    2022   Diabetes mellitus    Type II   Hypertension    NPH (normal pressure hydrocephalus) (HCC)    OSA (obstructive sleep apnea) 11/24/2016    Past Surgical History:  Procedure Laterality Date   CATARACT EXTRACTION, BILATERAL Bilateral 2019   COLONOSCOPY  07/06/2011   Procedure: COLONOSCOPY;  Surgeon: JWinfield Cunas, MD;  [facility]  Service: Endoscopy;  Laterality: N/A;   ESOPHAGOGASTRODUODENOSCOPY  07/05/2011   Procedure: ESOPHAGOGASTRODUODENOSCOPY (EGD);  Surgeon: WLandry Dyke MD;  [facility];  Service: Endoscopy;  Laterality: N/A;   EYE SURGERY     FRACTURE SURGERY     right wrist   PLACEMENT OF LUMBAR DRAIN N/A 08/17/2021   Procedure: PLACEMENT OF LUMBAR DRAIN;  Surgeon: OJudith Part MD;  [place] [facility]  Service: Neurosurgery;  Laterality: N/A;  3C/RM 19   SHUNT REVISION Right 02/13/2022   Procedure: RIGHT LUMBOPERITONEAL SHUNT REVISION;  Surgeon: OJudith Part MD;  [facility]  Service: Neurosurgery;  Laterality: Right;   VENTRICULOPERITONEAL SHUNT Right 12/16/2021   Procedure: Lumboperitoneal shunt placement;  Surgeon: OJudith Part MD;  [place] [facility]  Service: Neurosurgery;  Laterality: Right;    Family Psychiatric History: Mom: Depression  Family History:  Family History  Problem Relation Age of Onset   Brain cancer Mother    Hypertension Father    Diabetes Father    Stroke Father     Social History:  Social History   Socioeconomic History   Marital status: Married    Spouse name: JAshok Norris   Number of children: 3   Years of education: Not on file   Highest education level: Not on file  Occupational History   Occupation: retired    Comment: pEngineer, building services Tobacco Use   Smoking status: Former    Packs/day: 1.00    Years: 30.00    Total pack years: 30.00    Types: Cigarettes    Quit date: 07/03/2001    Years since quitting: 20.7   Smokeless tobacco: Former    Types: Chew    Quit date: 07/04/1991  Vaping Use   Vaping Use: Never used  Substance and Sexual Activity   Alcohol use: Yes    Alcohol/week: 18.0 standard drinks of alcohol    Types: 4 Glasses of wine, 14 Shots of liquor per week    Comment: about 2 drinks (wine, beer, or liquor)  Drug use: No   Sexual activity: Yes  Other Topics Concern   Not on file  Social History Narrative   Retired - Management consultant plants    Married       He likes to play golf. He likes to read and work in the yard.       Social Determinants of Health   Financial Resource Strain: Low Risk  (03/22/2022)   Overall Financial Resource Strain (CARDIA)    Difficulty of Paying Living Expenses: Not hard at all  Food Insecurity: No Food Insecurity (03/22/2022)   Hunger Vital Sign    Worried About Running Out of Food in the Last Year: Never true    Ran Out of Food in the Last Year: Never true  Transportation Needs: No Transportation Needs (03/22/2022)   PRAPARE - Hydrologist (Medical): No    Lack of Transportation (Non-Medical): No  Physical Activity: Sufficiently Active (03/22/2022)   Exercise Vital Sign    Days of Exercise per Week: 7 days    Minutes of Exercise per Session: 30 min  Stress: No Stress Concern Present (03/22/2022)   [place]    Feeling of Stress : Not at all  Social Connections: Moderately Isolated (03/10/2021)   Social Connection and Isolation Panel [NHANES]    Frequency of Communication with Friends and Family: Three times a  week    Frequency of Social Gatherings with Friends and Family: Once a week    Attends Religious Services: Never    Marine scientist or Organizations: No    Attends Archivist Meetings: Never    Marital Status: Married    Allergies:  Allergies  Allergen Reactions   Lipitor [Atorvastatin] Other (See Comments)    Muscle pain    Metabolic Disorder Labs: Lab Results  Component Value Date   HGBA1C 5.8 (H) 12/13/2021   MPG 119.76 12/13/2021   MPG 125.5 07/29/2021   No results found for: "PROLACTIN" Lab Results  Component Value Date   CHOL 181 12/07/2018   TRIG 54.0 12/07/2018   HDL 69.60 12/07/2018   CHOLHDL 3 12/07/2018   VLDL 10.8 12/07/2018   LDLCALC 101 (H) 12/07/2018   LDLCALC 92 11/07/2017   Lab Results  Component Value Date   TSH 1.835 07/30/2021   TSH 2.12 11/21/2019    Therapeutic Level Labs: No results found for: "LITHIUM" No results found for: "VALPROATE" No results found for: "CBMZ"  Current Medications: Current Outpatient Medications  Medication Sig Dispense Refill   Blood Glucose Calibration (TRUE METRIX LEVEL 1) Low SOLN USE TO CHECK METER PRIOR TO TESTING SUGAR 3 each 0   Blood Glucose Monitoring Suppl (TRUE METRIX AIR GLUCOSE METER) w/Device KIT 1 each by Does not apply route daily. 1 kit 0   carvedilol (COREG) 6.25 MG tablet Take 1 tablet (6.25 mg total) by mouth 2 (two) times daily with a meal. (Patient taking differently: Take 6.25 mg by mouth 2 (two) times daily as needed (systolic blood pressure >831.).) 180 tablet 1   Cholecalciferol (VITAMIN D-3 PO) Take 1 capsule by mouth in the morning.     citalopram (CELEXA) 20 MG tablet Take 1 tablet (20 mg total) by mouth in the morning. 90 tablet 1   clobetasol (OLUX) 0.05 % topical foam Apply topically 2 (two) times daily. 50 g 0   folic acid (FOLVITE) 1 MG tablet TAKE 1 TABLET BY MOUTH DAILY 90 tablet 2  ibuprofen (ADVIL) 200 MG tablet Take 400 mg by mouth every 8 (eight) hours as  needed (for pain.).     lisinopril-hydrochlorothiazide (ZESTORETIC) 20-12.5 MG tablet TAKE ONE TABLET BY MOUTH DAILY (Patient taking differently: Take 1 tablet by mouth daily.) 90 tablet 3   metFORMIN (GLUCOPHAGE) 1000 MG tablet TAKE 1 TABLET BY MOUTH DAILY (1000 MG TOTAL) WITH BREAKFAST (Patient taking differently: Take 1,000 mg by mouth daily with breakfast.) 90 tablet 1   modafinil (PROVIGIL) 100 MG tablet Take 1 tablet (100 mg total) by mouth daily. 30 tablet 0   Multiple Vitamin (MULTIVITAMIN WITH MINERALS) TABS tablet Take 1 tablet by mouth daily. 90 tablet 1   thiamine 100 MG tablet Take 1 tablet (100 mg total) by mouth daily. 90 tablet 1   TRUE METRIX BLOOD GLUCOSE TEST test strip USE TO TEST BLOOD GLUCOSE TWICE DAILY. 200 each 3   TRUEPLUS LANCETS 33G MISC USE TO CHECK BLOOD SUGAR DAILY AND PRN 300 each 0   vitamin B-12 (CYANOCOBALAMIN) 1000 MCG tablet Take 1,000 mcg by mouth See admin instructions. Take one tablet by mouth on Monday, Tuesday, Wednesday, Thursdays and Fridays only     No current facility-administered medications for this visit.     Musculoskeletal: Strength & Muscle Tone: decreased Gait & Station: shuffle Patient leans: Front  Psychiatric Specialty Exam: Review of Systems  HENT:  Positive for hearing loss.   Eyes:  Positive for visual disturbance.  Cardiovascular:  Positive for chest pain.  Musculoskeletal:  Positive for back pain.  Neurological:  Positive for headaches.  Psychiatric/Behavioral:  Positive for dysphoric mood. Negative for sleep disturbance and suicidal ideas. The patient is not nervous/anxious.     Blood pressure 134/68, pulse 89, SpO2 93 %.There is no height or weight on file to calculate BMI.  General Appearance: Casual  Eye Contact:  Good  Speech:  Clear and Coherent  Volume:  Normal  Mood:  Dysphoric  Affect:  Appropriate  Thought Process:  Coherent  Orientation:  Full (Time, Place, and Person)  Thought Content: Logical   Suicidal  Thoughts:  No  Homicidal Thoughts:  No  Memory:  Immediate;   Good Recent;   Fair  Judgement:  Poor  Insight:  Fair  Psychomotor Activity:  Shuffling Gait  Concentration:  Concentration: Fair  Recall:  [place] of Knowledge: Good  Language: Good  Akathisia:  No  Handed:    AIMS (if indicated): not done  Assets:  Housing Social Support  ADL's:  Intact  Cognition: WNL  Sleep:  Good   Screenings: AUDIT    Flowsheet Row Clinical Support from 03/31/2020 in [facility] at [place]  Alcohol Use Disorder Identification Test Final Score (AUDIT) 4      CAGE-AID    Flowsheet Row ED to Hosp-Admission (Discharged) from 07/29/2021 in [place] Score 1      [place] Office Visit from 11/21/2019 in [place] at [place]  Total Score (max 30 points ) 28      PHQ2-9    [place] Visit from 03/23/2022 in [facility] Clinical Support from 03/22/2022 in [place] at [facility] from 02/11/2022 in [place] at [place] from 03/10/2021 in [facility] [facility] [place] from 04/07/2020 in [place] at [facility] Total Score _0 0 1  PHQ-9 Total Score 18 -- 21 -- 9  Flowsheet Row Admission (Discharged) from 02/11/2022 in [facility] ED from 02/09/2022 in [facility] Admission (Discharged) from 12/16/2021 in [place] No Risk No Risk No Risk        Assessment and Plan:   Patient continues to endorse depressive symptoms that are secondary to his medical illness.  Patient received psychoeducation on his diagnoses being MDD, severe secondary to medical illness.  Patient agreed with this diagnosis reporting that he did not believe he would be depressed if he did not have NPH and rapid decline  in functionality.  Patient did appear to have rapid decline since last visit with this provider noticeably breathing louder.  However, patient has been consistent in his belief regarding quality of life and this has been discussed with his PCP in the past.  Despite patient endorsing some feeling of hopelessness he continues to be able to use humor which he is proud of.  Patient also remains insightful.  Patient is struggling with his rapid decline and at times takes it out on his wife.  However on discussion with wife, patient has historically more outwardly expressed irritability (verbally).  At this time patient is continuing with ADLs, is not overtly withdrawn and is not displaying symptoms indicating emergent need for psychiatric hospitalization.  MDD, severe secondary to medical illness NPH - Discontinue modafinil 100 mg daily - Discontinue Celexa 10 mg daily - Patient/wife can call to schedule future appointments  Collaboration of Care: Collaboration of Care:   Patient/Guardian was advised Release of Information must be obtained prior to any record release in order to collaborate their care with an outside provider. Patient/Guardian was advised if they have not already done so to contact the registration department to sign all necessary forms in order for [place] to release information regarding their care.   Consent: Patient/Guardian gives verbal consent for treatment and assignment of benefits for services provided during this visit. Patient/Guardian expressed understanding and agreed to proceed.   PGY-3 Freida Busman, MD 04/13/2022, 5:06 PM

## 2022-04-25 ENCOUNTER — Encounter (HOSPITAL_COMMUNITY): Payer: Self-pay | Admitting: Neurological Surgery

## 2022-04-25 ENCOUNTER — Telehealth (HOSPITAL_COMMUNITY): Payer: Self-pay | Admitting: Student in an Organized Health Care Education/Training Program

## 2022-04-25 ENCOUNTER — Other Ambulatory Visit: Payer: Self-pay

## 2022-04-25 NOTE — Telephone Encounter (Signed)
Attempted to confirm with patient that he did not wish to follow- up with this provider. Patient's wife picked up and endorsed that patient's shunt had been confirmed to not work properly and patient has continued to decompensate and is incoherent. Wife described behaviors such as inability to pick up a fork, soiling himself, confusion and turning over furniture this past weekend. This provider told patient she should use the after hours line and continue to attempt to get in contact with the office as well as go to the ED as patient is rapidly decompensating. Wife endorsed that patient had a previous negative experience in the ED and she is a bit reluctant to take him back, but she will continue to call the Neurosurgery office this AM.  This provider also sent a message to his Neurosurgeon updating him.   PGY-3 Rudene Re

## 2022-04-25 NOTE — Progress Notes (Signed)
Spoke with pt's wife for pre-op call. She states pt is very confused and not himself and he cannot do the call. She states he does not have a cardiac history, but is treated for HTN and is a Type 2 diabetic. Instructed Mrs. Widmayer to hold his Metformin in the AM. Last A1C was 5.8 on 12/13/21. She states he has not checked his blood sugar in over 6 weeks since the fluid leakage from brain has been going on.   Shower instructions given to Mrs. Seefeld. She states she has the CHG soap and will use it.

## 2022-04-26 ENCOUNTER — Encounter (HOSPITAL_COMMUNITY): Admission: RE | Disposition: E | Payer: Self-pay | Source: Home / Self Care | Attending: Neurological Surgery

## 2022-04-26 ENCOUNTER — Inpatient Hospital Stay (HOSPITAL_COMMUNITY): Payer: Medicare PPO | Admitting: Anesthesiology

## 2022-04-26 ENCOUNTER — Encounter (HOSPITAL_COMMUNITY): Payer: Self-pay | Admitting: Neurological Surgery

## 2022-04-26 ENCOUNTER — Inpatient Hospital Stay (HOSPITAL_COMMUNITY)
Admission: RE | Admit: 2022-04-26 | Discharge: 2022-05-04 | DRG: 028 | Disposition: E | Payer: Medicare PPO | Attending: Neurological Surgery | Admitting: Neurological Surgery

## 2022-04-26 ENCOUNTER — Other Ambulatory Visit: Payer: Self-pay

## 2022-04-26 DIAGNOSIS — Z87891 Personal history of nicotine dependence: Secondary | ICD-10-CM | POA: Diagnosis not present

## 2022-04-26 DIAGNOSIS — N1831 Chronic kidney disease, stage 3a: Secondary | ICD-10-CM | POA: Diagnosis present

## 2022-04-26 DIAGNOSIS — Z833 Family history of diabetes mellitus: Secondary | ICD-10-CM

## 2022-04-26 DIAGNOSIS — Z79899 Other long term (current) drug therapy: Secondary | ICD-10-CM | POA: Diagnosis not present

## 2022-04-26 DIAGNOSIS — Z66 Do not resuscitate: Secondary | ICD-10-CM | POA: Diagnosis not present

## 2022-04-26 DIAGNOSIS — S0003XA Contusion of scalp, initial encounter: Secondary | ICD-10-CM | POA: Diagnosis not present

## 2022-04-26 DIAGNOSIS — G912 (Idiopathic) normal pressure hydrocephalus: Secondary | ICD-10-CM

## 2022-04-26 DIAGNOSIS — G9340 Encephalopathy, unspecified: Secondary | ICD-10-CM | POA: Diagnosis not present

## 2022-04-26 DIAGNOSIS — Z8249 Family history of ischemic heart disease and other diseases of the circulatory system: Secondary | ICD-10-CM

## 2022-04-26 DIAGNOSIS — R410 Disorientation, unspecified: Secondary | ICD-10-CM | POA: Diagnosis not present

## 2022-04-26 DIAGNOSIS — G2581 Restless legs syndrome: Secondary | ICD-10-CM | POA: Diagnosis present

## 2022-04-26 DIAGNOSIS — D539 Nutritional anemia, unspecified: Secondary | ICD-10-CM | POA: Diagnosis present

## 2022-04-26 DIAGNOSIS — N179 Acute kidney failure, unspecified: Secondary | ICD-10-CM | POA: Diagnosis not present

## 2022-04-26 DIAGNOSIS — Z808 Family history of malignant neoplasm of other organs or systems: Secondary | ICD-10-CM

## 2022-04-26 DIAGNOSIS — I959 Hypotension, unspecified: Secondary | ICD-10-CM | POA: Diagnosis not present

## 2022-04-26 DIAGNOSIS — I129 Hypertensive chronic kidney disease with stage 1 through stage 4 chronic kidney disease, or unspecified chronic kidney disease: Secondary | ICD-10-CM | POA: Diagnosis present

## 2022-04-26 DIAGNOSIS — G4733 Obstructive sleep apnea (adult) (pediatric): Secondary | ICD-10-CM | POA: Diagnosis present

## 2022-04-26 DIAGNOSIS — G4730 Sleep apnea, unspecified: Secondary | ICD-10-CM | POA: Diagnosis not present

## 2022-04-26 DIAGNOSIS — E119 Type 2 diabetes mellitus without complications: Secondary | ICD-10-CM | POA: Diagnosis not present

## 2022-04-26 DIAGNOSIS — J9602 Acute respiratory failure with hypercapnia: Secondary | ICD-10-CM | POA: Diagnosis not present

## 2022-04-26 DIAGNOSIS — Z515 Encounter for palliative care: Secondary | ICD-10-CM

## 2022-04-26 DIAGNOSIS — I10 Essential (primary) hypertension: Secondary | ICD-10-CM | POA: Diagnosis not present

## 2022-04-26 DIAGNOSIS — G919 Hydrocephalus, unspecified: Secondary | ICD-10-CM | POA: Diagnosis not present

## 2022-04-26 DIAGNOSIS — R4182 Altered mental status, unspecified: Secondary | ICD-10-CM | POA: Diagnosis not present

## 2022-04-26 DIAGNOSIS — D181 Lymphangioma, any site: Secondary | ICD-10-CM | POA: Diagnosis not present

## 2022-04-26 DIAGNOSIS — Z7984 Long term (current) use of oral hypoglycemic drugs: Secondary | ICD-10-CM | POA: Diagnosis not present

## 2022-04-26 DIAGNOSIS — E870 Hyperosmolality and hypernatremia: Secondary | ICD-10-CM | POA: Diagnosis not present

## 2022-04-26 DIAGNOSIS — E1122 Type 2 diabetes mellitus with diabetic chronic kidney disease: Secondary | ICD-10-CM | POA: Diagnosis present

## 2022-04-26 DIAGNOSIS — G9389 Other specified disorders of brain: Secondary | ICD-10-CM | POA: Diagnosis not present

## 2022-04-26 DIAGNOSIS — E875 Hyperkalemia: Secondary | ICD-10-CM | POA: Diagnosis not present

## 2022-04-26 DIAGNOSIS — Z823 Family history of stroke: Secondary | ICD-10-CM

## 2022-04-26 DIAGNOSIS — Z8616 Personal history of COVID-19: Secondary | ICD-10-CM | POA: Diagnosis not present

## 2022-04-26 DIAGNOSIS — J9601 Acute respiratory failure with hypoxia: Secondary | ICD-10-CM | POA: Diagnosis not present

## 2022-04-26 DIAGNOSIS — Z982 Presence of cerebrospinal fluid drainage device: Secondary | ICD-10-CM | POA: Diagnosis not present

## 2022-04-26 HISTORY — PX: VENTRICULOPERITONEAL SHUNT: SHX204

## 2022-04-26 HISTORY — PX: SHUNT REMOVAL: SHX342

## 2022-04-26 LAB — BASIC METABOLIC PANEL
Anion gap: 9 (ref 5–15)
BUN: 21 mg/dL (ref 8–23)
CO2: 28 mmol/L (ref 22–32)
Calcium: 11.1 mg/dL — ABNORMAL HIGH (ref 8.9–10.3)
Chloride: 104 mmol/L (ref 98–111)
Creatinine, Ser: 1.38 mg/dL — ABNORMAL HIGH (ref 0.61–1.24)
GFR, Estimated: 51 mL/min — ABNORMAL LOW (ref >60)
Glucose, Bld: 150 mg/dL — ABNORMAL HIGH (ref 70–99)
Potassium: 4.6 mmol/L (ref 3.5–5.1)
Sodium: 141 mmol/L (ref 135–145)

## 2022-04-26 LAB — CBC
HCT: 46.5 % (ref 39.0–52.0)
HCT: 46.9 % (ref 39.0–52.0)
Hemoglobin: 15.4 g/dL (ref 13.0–17.0)
Hemoglobin: 15.1 g/dL (ref 13.0–17.0)
MCH: 33.4 pg (ref 26.0–34.0)
MCH: 32.5 pg (ref 26.0–34.0)
MCHC: 33.1 g/dL (ref 30.0–36.0)
MCHC: 32.2 g/dL (ref 30.0–36.0)
MCV: 100.9 fL — ABNORMAL HIGH (ref 80.0–100.0)
MCV: 101.1 fL — ABNORMAL HIGH (ref 80.0–100.0)
Platelets: 178 10*3/uL (ref 150–400)
Platelets: 169 10*3/uL (ref 150–400)
RBC: 4.61 MIL/uL (ref 4.22–5.81)
RBC: 4.64 MIL/uL (ref 4.22–5.81)
RDW: 12.9 % (ref 11.5–15.5)
RDW: 12.7 % (ref 11.5–15.5)
WBC: 7.1 10*3/uL (ref 4.0–10.5)
WBC: 10.3 10*3/uL (ref 4.0–10.5)
nRBC: 0 % (ref 0.0–0.2)
nRBC: 0 % (ref 0.0–0.2)

## 2022-04-26 LAB — COMPREHENSIVE METABOLIC PANEL
ALT: 25 U/L (ref 0–44)
AST: 20 U/L (ref 15–41)
Albumin: 3.8 g/dL (ref 3.5–5.0)
Alkaline Phosphatase: 70 U/L (ref 38–126)
Anion gap: 14 (ref 5–15)
BUN: 23 mg/dL (ref 8–23)
CO2: 26 mmol/L (ref 22–32)
Calcium: 11 mg/dL — ABNORMAL HIGH (ref 8.9–10.3)
Chloride: 102 mmol/L (ref 98–111)
Creatinine, Ser: 1.44 mg/dL — ABNORMAL HIGH (ref 0.61–1.24)
GFR, Estimated: 49 mL/min — ABNORMAL LOW (ref >60)
Glucose, Bld: 210 mg/dL — ABNORMAL HIGH (ref 70–99)
Potassium: 4.2 mmol/L (ref 3.5–5.1)
Sodium: 142 mmol/L (ref 135–145)
Total Bilirubin: 0.6 mg/dL (ref 0.3–1.2)
Total Protein: 6.4 g/dL — ABNORMAL LOW (ref 6.5–8.1)

## 2022-04-26 LAB — GLUCOSE, CAPILLARY
Glucose-Capillary: 147 mg/dL — ABNORMAL HIGH (ref 70–99)
Glucose-Capillary: 117 mg/dL — ABNORMAL HIGH (ref 70–99)
Glucose-Capillary: 158 mg/dL — ABNORMAL HIGH (ref 70–99)
Glucose-Capillary: 224 mg/dL — ABNORMAL HIGH (ref 70–99)

## 2022-04-26 LAB — MRSA NEXT GEN BY PCR, NASAL: MRSA by PCR Next Gen: NOT DETECTED

## 2022-04-26 SURGERY — SHUNT INSERTION VENTRICULAR-PERITONEAL
Anesthesia: General | Laterality: Right

## 2022-04-26 MED ORDER — INSULIN ASPART 100 UNIT/ML IJ SOLN
0.0000 [IU] | Freq: Three times a day (TID) | INTRAMUSCULAR | Status: DC
  Administered 2022-04-27: 3 [IU] via SUBCUTANEOUS
  Administered 2022-04-28 (×2): 2 [IU] via SUBCUTANEOUS
  Administered 2022-04-28: 3 [IU] via SUBCUTANEOUS
  Administered 2022-04-29 (×3): 2 [IU] via SUBCUTANEOUS

## 2022-04-26 MED ORDER — HYDROCHLOROTHIAZIDE 12.5 MG PO TABS
12.5000 mg | ORAL_TABLET | Freq: Every day | ORAL | Status: DC
  Administered 2022-04-26 – 2022-04-29 (×4): 12.5 mg via ORAL
  Filled 2022-04-26 (×5): qty 1

## 2022-04-26 MED ORDER — FENTANYL CITRATE (PF) 250 MCG/5ML IJ SOLN
INTRAMUSCULAR | Status: AC
  Filled 2022-04-26: qty 5

## 2022-04-26 MED ORDER — 0.9 % SODIUM CHLORIDE (POUR BTL) OPTIME
TOPICAL | Status: DC | PRN
  Administered 2022-04-26: 1000 mL

## 2022-04-26 MED ORDER — VITAMIN B-12 1000 MCG PO TABS
1000.0000 ug | ORAL_TABLET | ORAL | Status: DC
  Administered 2022-04-27 – 2022-04-29 (×3): 1000 ug via ORAL
  Filled 2022-04-26 (×2): qty 1

## 2022-04-26 MED ORDER — ORAL CARE MOUTH RINSE: 15.0000 mL | Freq: Once | OROMUCOSAL | Status: DC

## 2022-04-26 MED ORDER — INSULIN ASPART 100 UNIT/ML IJ SOLN
0.0000 [IU] | Freq: Every day | INTRAMUSCULAR | Status: DC
  Administered 2022-04-26: 2 [IU] via SUBCUTANEOUS

## 2022-04-26 MED ORDER — LIDOCAINE-EPINEPHRINE 1 %-1:100000 IJ SOLN
INTRAMUSCULAR | Status: AC
  Filled 2022-04-26: qty 1

## 2022-04-26 MED ORDER — DEXAMETHASONE SODIUM PHOSPHATE 10 MG/ML IJ SOLN
INTRAMUSCULAR | Status: AC
  Filled 2022-04-26: qty 1

## 2022-04-26 MED ORDER — SUCCINYLCHOLINE CHLORIDE 200 MG/10ML IV SOSY
PREFILLED_SYRINGE | INTRAVENOUS | Status: DC | PRN
  Administered 2022-04-26: 120 mg via INTRAVENOUS

## 2022-04-26 MED ORDER — SUGAMMADEX SODIUM 200 MG/2ML IV SOLN
INTRAVENOUS | Status: DC | PRN
  Administered 2022-04-26: 310.4 mg via INTRAVENOUS

## 2022-04-26 MED ORDER — ONDANSETRON HCL 4 MG/2ML IJ SOLN
INTRAMUSCULAR | Status: AC
  Filled 2022-04-26: qty 2

## 2022-04-26 MED ORDER — LACTATED RINGERS IV SOLN: INTRAVENOUS | Status: DC

## 2022-04-26 MED ORDER — THROMBIN 5000 UNITS EX SOLR
CUTANEOUS | Status: AC
  Filled 2022-04-26: qty 5000

## 2022-04-26 MED ORDER — FOLIC ACID 1 MG PO TABS
1.0000 mg | ORAL_TABLET | Freq: Every day | ORAL | Status: DC
  Administered 2022-04-27 – 2022-04-29 (×3): 1 mg via ORAL
  Filled 2022-04-26 (×4): qty 1

## 2022-04-26 MED ORDER — FENTANYL CITRATE (PF) 250 MCG/5ML IJ SOLN
INTRAMUSCULAR | Status: DC | PRN
  Administered 2022-04-26: 100 ug via INTRAVENOUS
  Administered 2022-04-26 (×3): 50 ug via INTRAVENOUS

## 2022-04-26 MED ORDER — METFORMIN HCL 500 MG PO TABS
1000.0000 mg | ORAL_TABLET | Freq: Every day | ORAL | Status: DC
  Administered 2022-04-27 – 2022-04-29 (×3): 1000 mg via ORAL
  Filled 2022-04-26 (×5): qty 2

## 2022-04-26 MED ORDER — CARVEDILOL 6.25 MG PO TABS
6.2500 mg | ORAL_TABLET | Freq: Every day | ORAL | Status: DC
  Administered 2022-04-27 – 2022-04-29 (×3): 6.25 mg via ORAL
  Filled 2022-04-26 (×2): qty 1
  Filled 2022-04-26: qty 2
  Filled 2022-04-26: qty 1

## 2022-04-26 MED ORDER — PHENYLEPHRINE 80 MCG/ML (10ML) SYRINGE FOR IV PUSH (FOR BLOOD PRESSURE SUPPORT)
PREFILLED_SYRINGE | INTRAVENOUS | Status: DC | PRN
  Administered 2022-04-26 (×2): 80 ug via INTRAVENOUS

## 2022-04-26 MED ORDER — ONDANSETRON HCL 4 MG/2ML IJ SOLN
INTRAMUSCULAR | Status: DC | PRN
  Administered 2022-04-26: 4 mg via INTRAVENOUS

## 2022-04-26 MED ORDER — LIDOCAINE-EPINEPHRINE 1 %-1:100000 IJ SOLN
INTRAMUSCULAR | Status: DC | PRN
  Administered 2022-04-26: 10 mL

## 2022-04-26 MED ORDER — ROCURONIUM BROMIDE 10 MG/ML (PF) SYRINGE
PREFILLED_SYRINGE | INTRAVENOUS | Status: DC | PRN
  Administered 2022-04-26: 60 mg via INTRAVENOUS
  Administered 2022-04-26 (×2): 10 mg via INTRAVENOUS

## 2022-04-26 MED ORDER — PHENYLEPHRINE HCL-NACL 20-0.9 MG/250ML-% IV SOLN
INTRAVENOUS | Status: DC | PRN
  Administered 2022-04-26: 50 ug/min via INTRAVENOUS

## 2022-04-26 MED ORDER — ROCURONIUM BROMIDE 10 MG/ML (PF) SYRINGE
PREFILLED_SYRINGE | INTRAVENOUS | Status: AC
  Filled 2022-04-26: qty 10

## 2022-04-26 MED ORDER — LIDOCAINE 2% (20 MG/ML) 5 ML SYRINGE
INTRAMUSCULAR | Status: DC | PRN
  Administered 2022-04-26: 20 mg via INTRAVENOUS

## 2022-04-26 MED ORDER — DEXAMETHASONE SODIUM PHOSPHATE 10 MG/ML IJ SOLN
INTRAMUSCULAR | Status: DC | PRN
  Administered 2022-04-26: 10 mg via INTRAVENOUS

## 2022-04-26 MED ORDER — THROMBIN 5000 UNITS EX SOLR: OROMUCOSAL | Status: DC | PRN

## 2022-04-26 MED ORDER — ORAL CARE MOUTH RINSE: 15.0000 mL | OROMUCOSAL | Status: DC | PRN

## 2022-04-26 MED ORDER — CHLORHEXIDINE GLUCONATE 0.12 % MT SOLN: 15.0000 mL | Freq: Once | OROMUCOSAL | Status: DC

## 2022-04-26 MED ORDER — EPHEDRINE SULFATE-NACL 50-0.9 MG/10ML-% IV SOSY
PREFILLED_SYRINGE | INTRAVENOUS | Status: DC | PRN
  Administered 2022-04-26 (×4): 5 mg via INTRAVENOUS

## 2022-04-26 MED ORDER — ACETAMINOPHEN 500 MG PO TABS
1000.0000 mg | ORAL_TABLET | Freq: Once | ORAL | Status: AC
  Administered 2022-04-26: 1000 mg via ORAL
  Filled 2022-04-26: qty 2

## 2022-04-26 MED ORDER — THIAMINE MONONITRATE 100 MG PO TABS
100.0000 mg | ORAL_TABLET | Freq: Every day | ORAL | Status: DC
  Administered 2022-04-27: 100 mg via ORAL
  Filled 2022-04-26: qty 1

## 2022-04-26 MED ORDER — LISINOPRIL-HYDROCHLOROTHIAZIDE 20-12.5 MG PO TABS: 1.0000 | ORAL_TABLET | Freq: Every day | ORAL | Status: DC

## 2022-04-26 MED ORDER — ADULT MULTIVITAMIN W/MINERALS CH
1.0000 | ORAL_TABLET | Freq: Every day | ORAL | Status: DC
  Administered 2022-04-27 – 2022-04-29 (×3): 1 via ORAL
  Filled 2022-04-26 (×4): qty 1

## 2022-04-26 MED ORDER — PROPOFOL 10 MG/ML IV BOLUS
INTRAVENOUS | Status: DC | PRN
  Administered 2022-04-26: 30 mg via INTRAVENOUS
  Administered 2022-04-26: 140 mg via INTRAVENOUS

## 2022-04-26 MED ORDER — CLOBETASOL PROPIONATE 0.05 % EX FOAM: 1.0000 | Freq: Two times a day (BID) | CUTANEOUS | Status: DC | PRN

## 2022-04-26 MED ORDER — BACITRACIN ZINC 500 UNIT/GM EX OINT
TOPICAL_OINTMENT | CUTANEOUS | Status: AC
  Filled 2022-04-26: qty 28.35

## 2022-04-26 MED ORDER — PROPOFOL 10 MG/ML IV BOLUS
INTRAVENOUS | Status: AC
  Filled 2022-04-26: qty 20

## 2022-04-26 MED ORDER — EPHEDRINE 5 MG/ML INJ
INTRAVENOUS | Status: AC
  Filled 2022-04-26: qty 5

## 2022-04-26 MED ORDER — LIDOCAINE 2% (20 MG/ML) 5 ML SYRINGE
INTRAMUSCULAR | Status: AC
  Filled 2022-04-26: qty 5

## 2022-04-26 MED ORDER — CHLORHEXIDINE GLUCONATE CLOTH 2 % EX PADS
6.0000 | MEDICATED_PAD | Freq: Every day | CUTANEOUS | Status: DC
  Administered 2022-04-26 – 2022-04-29 (×4): 6 via TOPICAL

## 2022-04-26 MED ORDER — CEFAZOLIN SODIUM-DEXTROSE 2-3 GM-%(50ML) IV SOLR
INTRAVENOUS | Status: DC | PRN
  Administered 2022-04-26: 2 g via INTRAVENOUS

## 2022-04-26 MED ORDER — LISINOPRIL 20 MG PO TABS
20.0000 mg | ORAL_TABLET | Freq: Every day | ORAL | Status: DC
  Administered 2022-04-26 – 2022-04-29 (×4): 20 mg via ORAL
  Filled 2022-04-26 (×5): qty 1

## 2022-04-26 MED ORDER — LIDOCAINE HCL (PF) 1 % IJ SOLN
INTRAMUSCULAR | Status: DC | PRN
  Administered 2022-04-26: 10 mL

## 2022-04-26 SURGICAL SUPPLY — 55 items
BAG COUNTER SPONGE SURGICOUNT (BAG) ×1 IMPLANT
BLADE CLIPPER SURG (BLADE) ×2 IMPLANT
BLADE SURG 11 STRL SS (BLADE) ×2 IMPLANT
BOOT SUTURE AID YELLOW STND (SUTURE) IMPLANT
BUR ACORN 9.0 PRECISION (BURR) ×1 IMPLANT
CANISTER SUCT 3000ML PPV (MISCELLANEOUS) ×1 IMPLANT
CLIP RANEY DISP (INSTRUMENTS) IMPLANT
DERMABOND ADVANCED .7 DNX12 (GAUZE/BANDAGES/DRESSINGS) ×1 IMPLANT
DRAPE HALF SHEET 40X57 (DRAPES) ×1 IMPLANT
DRAPE INCISE IOBAN 66X45 STRL (DRAPES) ×1 IMPLANT
DRAPE ORTHO SPLIT 77X108 STRL (DRAPES) ×2
DRAPE SURG ORHT 6 SPLT 77X108 (DRAPES) ×2 IMPLANT
DURAPREP 26ML APPLICATOR (WOUND CARE) ×2 IMPLANT
ELECT REM PT RETURN 9FT ADLT (ELECTROSURGICAL) ×1
ELECTRODE REM PT RTRN 9FT ADLT (ELECTROSURGICAL) ×1 IMPLANT
GAUZE 4X4 16PLY ~~LOC~~+RFID DBL (SPONGE) IMPLANT
GLOVE ECLIPSE 7.5 STRL STRAW (GLOVE) ×2 IMPLANT
GLOVE EXAM NITRILE XL STR (GLOVE) IMPLANT
GLOVE INDICATOR 7.5 STRL GRN (GLOVE) ×1 IMPLANT
GOWN STRL REUS W/ TWL LRG LVL3 (GOWN DISPOSABLE) ×2 IMPLANT
GOWN STRL REUS W/ TWL XL LVL3 (GOWN DISPOSABLE) IMPLANT
GOWN STRL REUS W/TWL 2XL LVL3 (GOWN DISPOSABLE) IMPLANT
GOWN STRL REUS W/TWL LRG LVL3 (GOWN DISPOSABLE) ×2
GOWN STRL REUS W/TWL XL LVL3 (GOWN DISPOSABLE)
HEMOSTAT POWDER KIT SURGIFOAM (HEMOSTASIS) ×1 IMPLANT
HEMOSTAT SURGICEL 2X14 (HEMOSTASIS) IMPLANT
KIT BASIN OR (CUSTOM PROCEDURE TRAY) ×1 IMPLANT
KIT TURNOVER KIT B (KITS) ×1 IMPLANT
MARKER SKIN DUAL TIP RULER LAB (MISCELLANEOUS) IMPLANT
NEEDLE HYPO 22GX1.5 SAFETY (NEEDLE) ×1 IMPLANT
NS IRRIG 1000ML POUR BTL (IV SOLUTION) ×1 IMPLANT
PACK LAMINECTOMY NEURO (CUSTOM PROCEDURE TRAY) ×1 IMPLANT
PAD ARMBOARD 7.5X6 YLW CONV (MISCELLANEOUS) ×3 IMPLANT
PASSER CATH 65CM DISP (NEUROSURGERY SUPPLIES) ×1 IMPLANT
SHEATH PERITONEAL INTRO 61 (SHEATH) ×1 IMPLANT
SPIKE FLUID TRANSFER (MISCELLANEOUS) ×1 IMPLANT
SPONGE T-LAP 4X18 ~~LOC~~+RFID (SPONGE) IMPLANT
STAPLER SKIN PROX WIDE 3.9 (STAPLE) ×1 IMPLANT
STRIP CLOSURE SKIN 1/2X4 (GAUZE/BANDAGES/DRESSINGS) IMPLANT
SUT ETHILON 3 0 FSL (SUTURE) IMPLANT
SUT MNCRL AB 3-0 PS2 18 (SUTURE) ×1 IMPLANT
SUT MON AB 3-0 SH 27 (SUTURE) ×4
SUT MON AB 3-0 SH27 (SUTURE) IMPLANT
SUT NURALON 4 0 TR CR/8 (SUTURE) IMPLANT
SUT SILK 0 TIES 10X30 (SUTURE) IMPLANT
SUT SILK 3 0 SH 30 (SUTURE) IMPLANT
SUT VIC AB 2-0 CP2 18 (SUTURE) ×1 IMPLANT
SUT VIC AB 3-0 SH 8-18 (SUTURE) ×2 IMPLANT
SYR 5ML LUER SLIP (SYRINGE) IMPLANT
TOWEL GREEN STERILE (TOWEL DISPOSABLE) ×2 IMPLANT
TOWEL GREEN STERILE FF (TOWEL DISPOSABLE) ×1 IMPLANT
TUBE CONNECTING 12X1/4 (SUCTIONS) IMPLANT
UNDERPAD 30X36 HEAVY ABSORB (UNDERPADS AND DIAPERS) ×1 IMPLANT
VALVE PROGRAM CERTAS SM ANTI (Valve) IMPLANT
WATER STERILE IRR 1000ML POUR (IV SOLUTION) ×1 IMPLANT

## 2022-04-26 NOTE — Anesthesia Procedure Notes (Signed)
Procedure Name: Intubation Date/Time: 05/01/2022 1:30 PM  Performed by: Lavell Luster, CRNAPre-anesthesia Checklist: Patient identified, Emergency Drugs available, Suction available, Patient being monitored and Timeout performed Patient Re-evaluated:Patient Re-evaluated prior to induction Oxygen Delivery Method: Circle system utilized Preoxygenation: Pre-oxygenation with 100% oxygen Induction Type: IV induction Ventilation: Mask ventilation without difficulty Laryngoscope Size: Mac and 4 Grade View: Grade III Tube type: Oral Tube size: 7.0 mm Airway Equipment and Method: Stylet Placement Confirmation: ETT inserted through vocal cords under direct vision, positive ETCO2 and breath sounds checked- equal and bilateral Secured at: 22 cm Tube secured with: Tape Dental Injury: Teeth and Oropharynx as per pre-operative assessment

## 2022-04-26 NOTE — Op Note (Signed)
PATIENT: Randy [name]  DAY OF SURGERY: 04/18/2022   PRE-OPERATIVE DIAGNOSIS:  Normal pressure hydrocephalus   POST-OPERATIVE DIAGNOSIS:  Same    PROCEDURE:  Placement of right ventriculoperitoneal shunt, removal of right lumboperitoneal shunt   SURGEON:  Surgeon(s) and Role:    Judith Part, MD - Primary      ANESTHESIA: ETGA   BRIEF HISTORY: This is an 81 year old man who presented with recurrence of his cognitive decline after some improvement following LP shunt placement, concerning for shunt failure. Given the he previously required a shunt revision, I recommended conversion to a VP shunt. This was discussed with the patient as well as risks, benefits, and alternatives and wished to proceed with surgery.   OPERATIVE DETAIL: The patient was taken to the operating room and placed on the OR table in the supine position. A formal time out was performed with two patient identifiers and confirmed the operative site. Anesthesia was induced by the anesthesia team. The head was turned contralaterally and a bump was placed under the ipsilateral shoulder to assist with tunneling. The operative sites were marked, hair was clipped with surgical clippers, and the area was then prepped and draped in a sterile fashion along the entire length of the shunt system.   A curvilinear incision was placed on the right frontal scalp over Kocher's point. A burr hole was placed, dura coagulated, and a ventricular catheter was inserted using standard landmarks to a depth of 7cm with good flow of CSF. Using a tunneler, the distal catheter was then passed subcutaneously from the scalp incision to the previously marked abdominal incision.   The patient's existing abdominal incision was opened and a repeat laparotomy was performed. The ventriculoperitoneal shunt system was flushed then connected to the proximal catheter. After spontaneous flow was seen through the distal catheter, it was then placed  intraperitoneal without resistance. With a new working VP shunt system in place, the distal catheter of the LP shunt was cut and removed.   All incisions were copiously irrigated and then closed in layers. All instrument and sponge counts were correct.   The patient was then positioned in the left lateral decubitus position for removal of the LP shunt system. The lateral and posterior LP shunt incisions were prepped and draped in a sterile fashion. The flank incision was opened sharply and the valve was dissected free. The and distal catheters was removed in their entirety along with the valve. This incision was then copiously irrigated and then closed in layers. Counts were again correct x2.   The patient was then returned to anesthesia for emergence. No apparent complications at the completion of the procedure.  IMPLANTS: Codman Certas shunt valve, set to performance level 3.0   EBL:  26m   DRAINS: none   SPECIMENS: LP shunt system sent for culture    TJudith Part MD 04/23/2022 12:08 PM

## 2022-04-26 NOTE — H&P (Signed)
Surgical H&P Update  HPI: 81 y.o. with a history of NPH with improvement in Sx post-op after shunt revision with progressive worsening of cognition, especially worse the past few days. No F/C/N/V, no other complaints. Here for shunt revision today.   PMHx:  Past Medical History:  Diagnosis Date   COVID 2020   mild case   Depression    2022   Diabetes mellitus    Type II   Hypertension    NPH (normal pressure hydrocephalus) (HCC)    OSA (obstructive sleep apnea) 11/24/2016   wears cpap   FamHx:  Family History  Problem Relation Age of Onset   Brain cancer Mother    Hypertension Father    Diabetes Father    Stroke Father    SocHx:  reports that he quit smoking about 20 years ago. His smoking use included cigarettes. He has a 30.00 pack-year smoking history. He quit smokeless tobacco use about 30 years ago.  His smokeless tobacco use included chew. He reports that he does not currently use alcohol after a past usage of about 18.0 standard drinks of alcohol per week. He reports that he does not use drugs.  Physical Exam: Awake/alert, hard of hearing, Ox1, PERRL, EOMI, FS, strength 5/5x4, Fcx4 but very confused, speech fluent with some mildly decrease overall output but normal content, just feels confused  Assesment/Plan: 81 y.o. man with NPH and suspected shunt failure, here for conversion to R VPS today - placement of new R VPS system and removal of his R LP shunt system. Risks, benefits, and alternatives discussed and the patient would like to continue with surgery.  -OR today -4NP post-op  Judith Part, MD 04/27/2022 12:05 PM

## 2022-04-26 NOTE — Transfer of Care (Signed)
Immediate Anesthesia Transfer of Care Note  Patient: Randy [name]  Procedure(s) Performed: Ventricular Peritoneal Shut placement (Right) Removal of Right Lumbar Peritoneal Shunt (Right)  Patient Location: PACU  Anesthesia Type:General  Level of Consciousness: drowsy, patient cooperative and responds to stimulation  Airway & Oxygen Therapy: Patient Spontanous Breathing and Patient connected to nasal cannula oxygen  Post-op Assessment: Report given to RN, Post -op Vital signs reviewed and stable, Patient moving all extremities X 4 and Patient able to stick tongue midline  Post vital signs: Reviewed  Last Vitals:  Vitals Value Taken Time  BP 141/129 04/16/2022 1630  Temp 97.7   Pulse 109 04/20/2022 1631  Resp 18 05/01/2022 1631  SpO2 96 % 05/02/2022 1631  Vitals shown include unvalidated device data.  Last Pain: There were no vitals filed for this visit.       Complications: No notable events documented.

## 2022-04-26 NOTE — Anesthesia Postprocedure Evaluation (Signed)
Anesthesia Post Note  Patient: Randy [name]  Procedure(s) Performed: Ventricular Peritoneal Shut placement (Right) Removal of Right Lumbar Peritoneal Shunt (Right)     Patient location during evaluation: PACU Anesthesia Type: General Level of consciousness: awake Pain management: pain level controlled Respiratory status: spontaneous breathing Cardiovascular status: stable Postop Assessment: no apparent nausea or vomiting Anesthetic complications: no   No notable events documented.  Last Vitals:  Vitals:   04/23/2022 1700 04/13/2022 1708  BP: (!) 189/135 (!) 158/84  Pulse: (!) 112   Resp:    Temp:    SpO2: 95% 98%    Last Pain:  Vitals:   05/03/2022 1630  PainSc: Asleep                 Henny Strauch

## 2022-04-26 NOTE — Anesthesia Preprocedure Evaluation (Signed)
Anesthesia Evaluation  Patient identified by MRN, date of birth, ID band Patient awake    Reviewed: Allergy & Precautions, NPO status , Patient's Chart, lab work & pertinent test results  Airway Mallampati: II       Dental   Pulmonary sleep apnea , former smoker,    breath sounds clear to auscultation       Cardiovascular hypertension,  Rhythm:Regular Rate:Normal     Neuro/Psych PSYCHIATRIC DISORDERS    GI/Hepatic negative GI ROS, Neg liver ROS,   Endo/Other  diabetes  Renal/GU negative Renal ROS     Musculoskeletal   Abdominal   Peds  Hematology   Anesthesia Other Findings   Reproductive/Obstetrics                             Anesthesia Physical Anesthesia Plan  ASA: 3  Anesthesia Plan: General   Post-op Pain Management:    Induction: Intravenous  PONV Risk Score and Plan: 3 and Dexamethasone and Midazolam  Airway Management Planned:   Additional Equipment: Arterial line  Intra-op Plan:   Post-operative Plan: Possible Post-op intubation/ventilation  Informed Consent: I have reviewed the patients History and Physical, chart, labs and discussed the procedure including the risks, benefits and alternatives for the proposed anesthesia with the patient or authorized representative who has indicated his/her understanding and acceptance.     Dental advisory given  Plan Discussed with: CRNA and Anesthesiologist  Anesthesia Plan Comments:         Anesthesia Quick Evaluation

## 2022-04-27 ENCOUNTER — Inpatient Hospital Stay (HOSPITAL_COMMUNITY): Payer: Medicare PPO

## 2022-04-27 ENCOUNTER — Encounter (HOSPITAL_COMMUNITY): Payer: Self-pay | Admitting: Neurological Surgery

## 2022-04-27 DIAGNOSIS — G912 (Idiopathic) normal pressure hydrocephalus: Secondary | ICD-10-CM | POA: Diagnosis not present

## 2022-04-27 LAB — URINALYSIS, ROUTINE W REFLEX MICROSCOPIC
Bilirubin Urine: NEGATIVE
Glucose, UA: NEGATIVE mg/dL
Hgb urine dipstick: NEGATIVE
Ketones, ur: NEGATIVE mg/dL
Leukocytes,Ua: NEGATIVE
Nitrite: NEGATIVE
Protein, ur: NEGATIVE mg/dL
Specific Gravity, Urine: 1.018 (ref 1.005–1.030)
pH: 5 (ref 5.0–8.0)

## 2022-04-27 LAB — GLUCOSE, CAPILLARY
Glucose-Capillary: 107 mg/dL — ABNORMAL HIGH (ref 70–99)
Glucose-Capillary: 168 mg/dL — ABNORMAL HIGH (ref 70–99)
Glucose-Capillary: 115 mg/dL — ABNORMAL HIGH (ref 70–99)
Glucose-Capillary: 167 mg/dL — ABNORMAL HIGH (ref 70–99)
Glucose-Capillary: 170 mg/dL — ABNORMAL HIGH (ref 70–99)

## 2022-04-27 LAB — SEDIMENTATION RATE: Sed Rate: 20 mm/hr — ABNORMAL HIGH (ref 0–16)

## 2022-04-27 LAB — TSH: TSH: 1.08 u[IU]/mL (ref 0.350–4.500)

## 2022-04-27 LAB — VITAMIN B12: Vitamin B-12: 1095 pg/mL — ABNORMAL HIGH (ref 180–914)

## 2022-04-27 LAB — FOLATE: Folate: >40 ng/mL (ref >5.9)

## 2022-04-27 MED ORDER — CEFAZOLIN SODIUM-DEXTROSE 2-4 GM/100ML-% IV SOLN
2.0000 g | INTRAVENOUS | Status: AC
  Filled 2022-04-27: qty 100

## 2022-04-27 MED ORDER — THIAMINE HCL 100 MG/ML IJ SOLN
500.0000 mg | Freq: Three times a day (TID) | INTRAVENOUS | Status: AC
  Administered 2022-04-28 – 2022-04-29 (×5): 500 mg via INTRAVENOUS
  Filled 2022-04-27 (×6): qty 5

## 2022-04-27 MED ORDER — CHLORHEXIDINE GLUCONATE CLOTH 2 % EX PADS: 6.0000 | MEDICATED_PAD | Freq: Once | CUTANEOUS | Status: AC

## 2022-04-27 MED ORDER — THIAMINE HCL 100 MG/ML IJ SOLN
250.0000 mg | Freq: Every day | INTRAVENOUS | Status: DC
  Filled 2022-04-27: qty 2.5

## 2022-04-27 MED ORDER — THIAMINE HCL 100 MG/ML IJ SOLN: 100.0000 mg | Freq: Every day | INTRAMUSCULAR | Status: DC

## 2022-04-27 NOTE — Consult Note (Signed)
Neurology Consultation  Reason for Consult: AMS Referring Physician: Dr. Zada Finders   CC: NPH/shunt failure   History is obtained from:wife and medical record   HPI: Randy [name] is a 81 y.o. male with past medial history of NPH s/p vp shunt, HTN, OSA on CPAP,  DM and depression who presented to the hospital on 10/24 for shunt revision.  Per patients wife, In May she became very confused and was not able to stand. At this time a lumbar drain was placed with good response and after the drain was removed his symptoms of confusion returned. It was decided to proceed with shunt placement, wife states there were improvements, however he was not back to his baseline and remained confused. Wife states there have been multiple adjustments to shunt were made however his confusion remained. Over the past 2 weeks he has decreased appetite, has had trouble using the fork to eat, cant seem to get the fork to his mouth and the confusion has continued to get worse. He also only wants to sit in the chair during the day and has required more sleep has been going to bed at 2000 and sleeping until 10am. On this past Thursday he developed worsening confusion and inability to speak, family describes it as garbled speech. It was then decided to do shunt revision. Per wife prior to May he did not have any cognitive issues and was very active with mowing the lawn and walking the dogs, etc. Per wife he did drink a lot of liquor however in the last 2 weeks she has cut him down to 1 glass of scotch at night.  Neurology consulted  Per nurse he does have periods of extreme outbursts and agitation   ROS Unable to obtain due to altered mental status.   Past Medical History:  Diagnosis Date   COVID 2020   mild case   Depression    2022   Diabetes mellitus    Type II   Hypertension    NPH (normal pressure hydrocephalus) (HCC)    OSA (obstructive sleep apnea) 11/24/2016   wears cpap     Family History  Problem  Relation Age of Onset   Brain cancer Mother    Hypertension Father    Diabetes Father    Stroke Father      Social History:   reports that he quit smoking about 20 years ago. His smoking use included cigarettes. He has a 30.00 pack-year smoking history. He quit smokeless tobacco use about 30 years ago.  His smokeless tobacco use included chew. He reports that he does not currently use alcohol after a past usage of about 18.0 standard drinks of alcohol per week. He reports that he does not use drugs.  Medications  Current Facility-Administered Medications:    carvedilol (COREG) tablet 6.25 mg, 6.25 mg, Oral, Daily, Ostergard, Thomas A, MD, 6.25 mg at 04/27/22 0901   ceFAZolin (ANCEF) IVPB 2g/100 mL premix, 2 g, Intravenous, On Call to OR, Ostergard, Joyice Faster, MD   Chlorhexidine Gluconate Cloth 2 % PADS 6 each, 6 each, Topical, Daily, Ostergard, Joyice Faster, MD, 6 each at 04/27/22 0830   cyanocobalamin (VITAMIN B12) tablet 1,000 mcg, 1,000 mcg, Oral, Q MTWThF, Ostergard, Joyice Faster, MD   folic acid (FOLVITE) tablet 1 mg, 1 mg, Oral, Daily, Ostergard, Thomas A, MD, 1 mg at 04/27/22 0901   lisinopril (ZESTRIL) tablet 20 mg, 20 mg, Oral, Daily, 20 mg at 04/27/22 0901 **AND** hydrochlorothiazide (HYDRODIURIL) tablet 12.5 mg, 12.5  mg, Oral, Daily, Bell, Lorin C, RPH, 12.5 mg at 04/27/22 0901   insulin aspart (novoLOG) injection 0-15 Units, 0-15 Units, Subcutaneous, TID WC, Judith Part, MD, 3 Units at 04/27/22 1150   insulin aspart (novoLOG) injection 0-5 Units, 0-5 Units, Subcutaneous, QHS, Judith Part, MD, 2 Units at 04/07/2022 2159   metFORMIN (GLUCOPHAGE) tablet 1,000 mg, 1,000 mg, Oral, Q breakfast, Judith Part, MD, 1,000 mg at 04/27/22 6803   multivitamin with minerals tablet 1 tablet, 1 tablet, Oral, Daily, Judith Part, MD, 1 tablet at 04/27/22 0901   Oral care mouth rinse, 15 mL, Mouth Rinse, PRN, Judith Part, MD   thiamine (VITAMIN B1) tablet 100 mg, 100 mg,  Oral, Daily, Judith Part, MD, 100 mg at 04/27/22 0901   Exam: Current vital signs: BP (!) 153/54   Pulse 91   Temp 99.1 F (37.3 C) (Axillary)   Resp 18   Ht '5\' 10"'$  (1.778 m)   Wt 77.6 kg   SpO2 100%   BMI 24.54 kg/m  Vital signs in last 24 hours: Temp:  [97.9 F (36.6 C)-99.6 F (37.6 C)] 99.1 F (37.3 C) (10/25 1600) Pulse Rate:  [67-135] 91 (10/25 1700) Resp:  [10-29] 18 (10/25 1700) BP: (96-181)/(37-127) 153/54 (10/25 1700) SpO2:  [92 %-100 %] 100 % (10/25 1700)  GENERAL: Awake, alert in NAD HEENT: - Normocephalic and atraumatic, dry mm LUNGS - Clear to auscultation bilaterally with no wheezes CV - S1S2 RRR, no m/r/g, equal pulses bilaterally. ABDOMEN - Soft, nontender, nondistended with normoactive BS Ext: warm, well perfused, intact peripheral pulses, no edema  NEURO:  Mental Status: AA&Oxself, can identify his wife and daughter. Unable to state month, year, place or city. He follows commands, responses are delayed Language: speech is garbled and at times incomprehensible.  Some words can be understood. He is able to name and repeat Cranial Nerves: PERRL EOMI, visual fields full, no facial asymmetry, facial sensation intact, hearing intact, tongue/uvula/soft palate midline, normal sternocleidomastoid and trapezius muscle strength. No evidence of tongue atrophy or fibrillations Motor: generalized weakness, can lift extremities antigravity with no drift  Tone: is normal and bulk is normal Sensation- Intact to light touch bilaterally Coordination: FTN intact bilaterally Gait- deferred   Labs I have reviewed labs in epic and the results pertinent to this consultation are:  CBC    Component Value Date/Time   WBC 10.3 04/24/2022 1758   RBC 4.64 04/15/2022 1758   HGB 15.1 05/02/2022 1758   HCT 46.9 04/08/2022 1758   PLT 169 04/15/2022 1758   MCV 101.1 (H) 04/20/2022 1758   MCH 32.5 04/22/2022 1758   MCHC 32.2 04/29/2022 1758   RDW 12.7 05/03/2022 1758    LYMPHSABS 1.5 07/29/2021 1341   MONOABS 0.9 07/29/2021 1341   EOSABS 0.3 07/29/2021 1341   BASOSABS 0.0 07/29/2021 1341    CMP     Component Value Date/Time   NA 142 04/09/2022 2006   K 4.2 05/01/2022 2006   CL 102 04/03/2022 2006   CO2 26 04/03/2022 2006   GLUCOSE 210 (H) 04/10/2022 2006   BUN 23 04/20/2022 2006   CREATININE 1.44 (H) 04/18/2022 2006   CALCIUM 11.0 (H) 04/17/2022 2006   PROT 6.4 (L) 04/08/2022 2006   PROT 6.4 04/20/2021 1329   ALBUMIN 3.8 04/15/2022 2006   AST 20 04/22/2022 2006   ALT 25 04/18/2022 2006   ALKPHOS 70 04/20/2022 2006   BILITOT 0.6 04/19/2022 2006   GFRNONAA 49 (L)  05/02/2022 2006   GFRAA >60 02/05/2019 0810    Lipid Panel     Component Value Date/Time   CHOL 181 12/07/2018 1157   TRIG 54.0 12/07/2018 1157   HDL 69.60 12/07/2018 1157   CHOLHDL 3 12/07/2018 1157   VLDL 10.8 12/07/2018 1157   LDLCALC 101 (H) 12/07/2018 1157     Imaging I have reviewed the images obtained:  CT-head 1. New right vertex approach CSF shunt placed into the left lateral ventricle. Slightly decreased ventricle size diffusely compared to August. 2. But in addition to postoperative pneumocephalus, there are new Bilateral Subdural Hygromas (6 mm on the left and 5 mm on the right). Thus, consider over-shunting.  3. No significant intracranial mass effect at this time. No hyperdense blood products.  MRI examination of the brain 1. Postsurgical changes from right frontal approach ventriculostomy catheter placement. Ventricular size remains stable when compared to recent CT. 2. Bilateral subdural hygromas measuring up to 6 mm on the right an 8 mm on the left also appear unchanged.    Assessment:  Randy [name] is a 81 y.o. male with past medial history of NPH s/p vp shunt, HTN, OSA on CPAP,  DM and depression who presented to the hospital on 10/24 with progressive worsening of his cognition over a few days. He underwent shunt revision with no  significant change in his confusion. Etiology of his encephalopathy is not obvious. Does have history of EtOH intake. Seems to have a waxing and waning component to the noted encephalopathy.  Recommendations: - Delirium precautions, melatonin at bedtime. - Check TSH, B1, B12, folate, Ammonia. - routine EEG - Thiamine high dose replacement - check UA  Beulah Gandy DNP, ACNPC-AG   NEUROHOSPITALIST ADDENDUM Performed a face to face diagnostic evaluation.   I have reviewed the contents of history and physical exam as documented by PA/ARNP/Resident and agree with above documentation.  I have discussed and formulated the above plan as documented. Edits to the note have been made as needed.  Spoke to wife over phone. He improved after shunt placement in June, gait and memory worsened late July and got shunt revision in august. He has slowly declined over last 6 weeks. Drinks a glass of scotch nightly but has not had any over the last 4 weeks. Since Friday, he has declined sharply and much different that in the past. This time, he is confused and not just memory impairment, he is restless and agitated. When he was being taken down for MRI, he thought they were going to kill him and so was yelling out. Wife left to go home at [place] last night and he was mumbling to himself, agiated and restless and has not been sleeping much.  Etiology of his encephalopathy is still unclear. No obvious electrolyte derangements, CSF profile not concerning for meningitis, UA not concerning for a UTI. Vit B12, folate normal, TSH normal. MRI is non-revealing.  Will get Ammonia, routine EEG, high dose thiamine replacement while thiamine levels are pending, COVID PCR. Start melatonin at bedtime and if does not improve over the next day or two, consider scheduled Seroquel at bedtime.  Donnetta Simpers, MD [facility] [phone]   If 7pm to 7am, please call on call as listed on AMION.

## 2022-04-27 NOTE — Progress Notes (Signed)
Neurosurgery Service Progress Note  Subjective: No acute events overnight, subjectively about the same post-op thus far from a mental status perspective except less somnolent   Objective: Vitals:   04/27/22 0400 04/27/22 0500 04/27/22 0600 04/27/22 0800  BP: 118/89 (!) 127/56 (!) 117/43   Pulse: 98 88 75   Resp: (!) '24 15 19   '$ Temp:    97.9 F (36.6 C)  TempSrc: Axillary   Axillary  SpO2: 95% 99% 99%   Weight:      Height:        Physical Exam: Aox1, decreased overall verbal output, face symmetric, EOMI, Fcx4 without preference, incisions c/d/I x3  Assessment & Plan: 81 y.o. man s/p LP to VP shunt conversion. Post-op CTH with b/l hygromas, cross-court catheter in otherwise good position.  -discussed w/ the pt's wife, his current setting is draining less than his LP shunt and therefore the hygromas should improve, will see how they do on serial imaging, doubt they're causing the decline -MRI brain to eval for alternative cause -labs unrevealing thus far, macrocytic anemia but he's been on b12/folate supplementation and wouldn't explain such a rapid decline, has been on thiamine supplementation as well, will c/s neurology for alternative causes -SCDs/TEDs, SQH POD2 -transfer to stepdown  Judith Part  04/27/22 9:18 AM

## 2022-04-28 ENCOUNTER — Inpatient Hospital Stay (HOSPITAL_COMMUNITY): Payer: Medicare PPO

## 2022-04-28 DIAGNOSIS — R4182 Altered mental status, unspecified: Secondary | ICD-10-CM

## 2022-04-28 LAB — GLUCOSE, CAPILLARY
Glucose-Capillary: 139 mg/dL — ABNORMAL HIGH (ref 70–99)
Glucose-Capillary: 173 mg/dL — ABNORMAL HIGH (ref 70–99)
Glucose-Capillary: 142 mg/dL — ABNORMAL HIGH (ref 70–99)
Glucose-Capillary: 98 mg/dL (ref 70–99)

## 2022-04-28 LAB — SARS CORONAVIRUS 2 BY RT PCR: SARS Coronavirus 2 by RT PCR: NEGATIVE

## 2022-04-28 LAB — RPR: RPR Ser Ql: NONREACTIVE

## 2022-04-28 LAB — AMMONIA: Ammonia: 20 umol/L (ref 9–35)

## 2022-04-28 MED ORDER — MELATONIN 5 MG PO TABS
5.0000 mg | ORAL_TABLET | Freq: Every day | ORAL | Status: DC
  Administered 2022-04-28 – 2022-04-29 (×3): 5 mg via ORAL
  Filled 2022-04-28 (×3): qty 1

## 2022-04-28 MED ORDER — HEPARIN SODIUM (PORCINE) 5000 UNIT/ML IJ SOLN
5000.0000 [IU] | Freq: Three times a day (TID) | INTRAMUSCULAR | Status: DC
  Administered 2022-04-28 – 2022-04-30 (×7): 5000 [IU] via SUBCUTANEOUS
  Filled 2022-04-28 (×7): qty 1

## 2022-04-28 MED ORDER — SODIUM CHLORIDE 0.9 % IV SOLN: INTRAVENOUS | Status: DC

## 2022-04-28 NOTE — Procedures (Signed)
Patient Name: Randy [name]  MRN: [id number]  Epilepsy Attending: Lora Havens  Referring Physician/Provider: Donnetta Simpers, MD  Date: 04/28/2022 Duration: 22.34 mins  Patient history: 81 year old male with altered mental status.  EEG to evaluate for seizure.  Level of alertness: lethargic   AEDs during EEG study: None  Technical aspects: This EEG study was done with scalp electrodes positioned according to the 10-20 International system of electrode placement. Electrical activity was reviewed with band pass filter of 1-'70Hz'$ , sensitivity of 7 uV/mm, display speed of 30m/sec with a '60Hz'$  notched filter applied as appropriate. EEG data were recorded continuously and digitally stored.  Video monitoring was available and reviewed as appropriate.  Description: EEG showed continuous generalized predominantly 5 to 7 Hz theta slowing admixed with intermittent 2-'3Hz'$  delta slowing. Hyperventilation and photic stimulation were not performed.     ABNORMALITY - Continuous slow, generalized  IMPRESSION: This study is suggestive of moderate to severe diffuse encephalopathy, nonspecific etiology. No seizures or epileptiform discharges were seen throughout the recording.  [name] OBarbra Sarks

## 2022-04-28 NOTE — Progress Notes (Signed)
EEG complete - results pending 

## 2022-04-28 NOTE — Progress Notes (Addendum)
Neurosurgery Service Progress Note  Subjective: No acute events overnight, very agitated when he was at MRI, better overnight, no improvement in mental status  Objective: Vitals:   04/27/22 2100 04/27/22 2213 04/28/22 0303 04/28/22 0808  BP: (!) 185/94 (!) 152/92 (!) 117/97 (!) 154/74  Pulse: (!) 120 (!) 111 (!) 102 90  Resp: '18 18 18 20  '$ Temp:  98.3 F (36.8 C) 98.9 F (37.2 C) 99.1 F (37.3 C)  TempSrc:  Oral Oral Oral  SpO2: 92% 94% 94% 93%  Weight:      Height:        Physical Exam: Ox1 not somnolent but globally depressed cognition with decreased overall verbal output, face symmetric, EOMI, Fcx4 without preference, incisions c/d/I x3  Assessment & Plan: 81 y.o. man s/p LP to VP shunt conversion. Post-op CTH with b/l hygromas, cross-court catheter in otherwise good position. MRI with stable hygromas, no other concerning findings, exam most consistent with encephalopathy  -neurology recs -dec'd po intake, will rpt RFP and start IVF, depending on neurology workup, may require Dobhoff tomorrow -SCDs/TEDs, Jackey Loge  04/28/22 8:18 AM

## 2022-04-29 ENCOUNTER — Inpatient Hospital Stay (HOSPITAL_COMMUNITY): Payer: Medicare PPO

## 2022-04-29 DIAGNOSIS — G912 (Idiopathic) normal pressure hydrocephalus: Secondary | ICD-10-CM | POA: Diagnosis not present

## 2022-04-29 LAB — RENAL FUNCTION PANEL
Albumin: 3.2 g/dL — ABNORMAL LOW (ref 3.5–5.0)
Anion gap: 9 (ref 5–15)
BUN: 27 mg/dL — ABNORMAL HIGH (ref 8–23)
CO2: 28 mmol/L (ref 22–32)
Calcium: 10.7 mg/dL — ABNORMAL HIGH (ref 8.9–10.3)
Chloride: 105 mmol/L (ref 98–111)
Creatinine, Ser: 1.49 mg/dL — ABNORMAL HIGH (ref 0.61–1.24)
GFR, Estimated: 47 mL/min — ABNORMAL LOW (ref >60)
Glucose, Bld: 118 mg/dL — ABNORMAL HIGH (ref 70–99)
Phosphorus: 3.2 mg/dL (ref 2.5–4.6)
Potassium: 3.9 mmol/L (ref 3.5–5.1)
Sodium: 142 mmol/L (ref 135–145)

## 2022-04-29 LAB — CBC WITH DIFFERENTIAL/PLATELET
Abs Immature Granulocytes: 0.02 10*3/uL (ref 0.00–0.07)
Basophils Absolute: 0 10*3/uL (ref 0.0–0.1)
Basophils Relative: 0 %
Eosinophils Absolute: 0.3 10*3/uL (ref 0.0–0.5)
Eosinophils Relative: 3 %
HCT: 41.6 % (ref 39.0–52.0)
Hemoglobin: 13.2 g/dL (ref 13.0–17.0)
Immature Granulocytes: 0 %
Lymphocytes Relative: 17 %
Lymphs Abs: 1.6 10*3/uL (ref 0.7–4.0)
MCH: 32.4 pg (ref 26.0–34.0)
MCHC: 31.7 g/dL (ref 30.0–36.0)
MCV: 102 fL — ABNORMAL HIGH (ref 80.0–100.0)
Monocytes Absolute: 1 10*3/uL (ref 0.1–1.0)
Monocytes Relative: 11 %
Neutro Abs: 6.5 10*3/uL (ref 1.7–7.7)
Neutrophils Relative %: 69 %
Platelets: 196 10*3/uL (ref 150–400)
RBC: 4.08 MIL/uL — ABNORMAL LOW (ref 4.22–5.81)
RDW: 12.6 % (ref 11.5–15.5)
WBC: 9.4 10*3/uL (ref 4.0–10.5)
nRBC: 0 % (ref 0.0–0.2)

## 2022-04-29 LAB — AEROBIC CULTURE W GRAM STAIN (SUPERFICIAL SPECIMEN)
Culture: NO GROWTH
Gram Stain: NONE SEEN

## 2022-04-29 LAB — GLUCOSE, CAPILLARY
Glucose-Capillary: 121 mg/dL — ABNORMAL HIGH (ref 70–99)
Glucose-Capillary: 123 mg/dL — ABNORMAL HIGH (ref 70–99)
Glucose-Capillary: 132 mg/dL — ABNORMAL HIGH (ref 70–99)
Glucose-Capillary: 137 mg/dL — ABNORMAL HIGH (ref 70–99)

## 2022-04-29 LAB — CORTISOL: Cortisol, Plasma: 23.8 ug/dL

## 2022-04-29 LAB — LACTATE DEHYDROGENASE: LDH: 86 U/L — ABNORMAL LOW (ref 98–192)

## 2022-04-29 MED ORDER — QUETIAPINE FUMARATE 50 MG PO TABS
25.0000 mg | ORAL_TABLET | Freq: Every day | ORAL | Status: DC
  Administered 2022-04-29: 25 mg via ORAL
  Filled 2022-04-29: qty 1

## 2022-04-29 MED ORDER — METOPROLOL TARTRATE 5 MG/5ML IV SOLN
5.0000 mg | Freq: Three times a day (TID) | INTRAVENOUS | Status: DC
  Administered 2022-04-30 (×2): 5 mg via INTRAVENOUS
  Filled 2022-04-29 (×2): qty 5

## 2022-04-29 NOTE — Care Management Important Message (Signed)
Important Message  Patient Details  Name: Randy [name] MRN: [id number] Date of Birth: 05-14-1941   Medicare Important Message Given:  Yes     Hannah Beat 04/29/2022, 2:13 PM

## 2022-04-29 NOTE — Progress Notes (Signed)
Neurosurgery Service Progress Note  Subjective: No acute events overnight, no improvement  Objective: Vitals:   04/28/22 1910 04/28/22 2259 04/29/22 0256 04/29/22 0801  BP: 136/60 131/75 (!) 153/66 (!) 161/57  Pulse: 82 86 89 69  Resp: '20 14 20 19  '$ Temp: 98.1 F (36.7 C) 97.6 F (36.4 C) 97.8 F (36.6 C) (!) 97.3 F (36.3 C)  TempSrc: Axillary Axillary Axillary Axillary  SpO2: 90% 98% 94% 95%  Weight:      Height:        Physical Exam: Ox1 not somnolent but globally depressed cognition with decreased overall verbal output, face symmetric, EOMI, Fcx4 without preference, incisions c/d/I x3  Assessment & Plan: 81 y.o. man s/p LP to VP shunt conversion. Post-op CTH with b/l hygromas, cross-court catheter in otherwise good position. MRI with stable hygromas, no other concerning findings, exam most consistent with encephalopathy  -neurology recs, will re-discuss today -dec'd po intake, on IVF -does have hypercalcemia that I think is unexplained with slowly progressive drop in GFR, will send off LDH, SPEP, PTH, CBC w/ diff, random cortisol -SCDs/TEDs, SQH  Judith Part  04/29/22 9:26 AM

## 2022-04-29 NOTE — Progress Notes (Addendum)
NEUROLOGY PROGRESS NOTE   Date of service: April 29, 2022 Patient Name: Randy [name] MRN:  [id number] DOB:  Nov 01, 1940  Interval Hx   Still encephalopathic, without significant change. rEEG with no seizures, only generalized slowing. Sleeping throughout AM but not well through the night with melatonin 3 mg.  Wife is at bedside.  Vitals   Vitals:   04/28/22 2259 04/29/22 0256 04/29/22 0801 04/29/22 1149  BP: 131/75 (!) 153/66 (!) 161/57 (!) 140/63  Pulse: 86 89 69 74  Resp: '14 20 19 18  '$ Temp: 97.6 F (36.4 C) 97.8 F (36.6 C) (!) 97.3 F (36.3 C) (!) 97.3 F (36.3 C)  TempSrc: Axillary Axillary Axillary Oral  SpO2: 98% 94% 95% 97%  Weight:      Height:        Body mass index is 24.54 kg/m.  Physical Exam   General: Laying in bed on no acute distress, audible snoring , mouth open  HENT: Normal oropharynx and mucosa. Normal external appearance of ears and nose.  Neck: Supple, no pain or tenderness  CV: No JVD. No peripheral edema.  Pulmonary: Symmetric Chest rise. Normal respiratory effort.  Abdomen: Soft to touch, non-tender.  Ext: No cyanosis, edema, or deformity  Skin: No rash. Normal palpation of skin.   Musculoskeletal: Normal digits and nails by inspection. No clubbing.   Neurologic Examination  Mental status/Cognition: Alert, oriented to self only with impaired attention span  Speech/language: impairment fluency, comprehension , naming and repetition (limited in setting of encephalopathy)   Cranial nerves:   CN II Pupils equal and reactive to light, no VF deficits on BTT   CN III,IV,VI EOM intact, no gaze preference or deviation, no nystagmus    CN V normal sensation in V1, V2, and V3 segments bilaterally (limited)   CN VII no asymmetry, no nasolabial fold flattening    CN VIII normal hearing to speech    CN IX & X normal palatal elevation, no uvular deviation    CN XI Unable to assess    CN XII Unable to assess   Motor:  Muscle bulk: normal, tone  normal,  + asterixis Ues BL. At least 3/5 strength x 4 appendages   Reflexes: 2+ throughout but with + clonus RLE (5-6 beats), neg Hoffman's reflex   Sensation: In tact to noxious stimulus x 4   Coordination/Complex Motor:  - Finger to Nose unable to assess  - Heel to shin :  unable to assess  - Rapid alternating movement :  unable to assess  - Gait: Stride length  unable to assess   Labs   Basic Metabolic Panel:  Lab Results  Component Value Date   NA 142 04/29/2022   K 3.9 04/29/2022   CO2 28 04/29/2022   GLUCOSE 118 (H) 04/29/2022   BUN 27 (H) 04/29/2022   CREATININE 1.49 (H) 04/29/2022   CALCIUM 10.7 (H) 04/29/2022   GFRNONAA 47 (L) 04/29/2022   GFRAA >60 02/05/2019   HbA1c:  Lab Results  Component Value Date   HGBA1C 5.8 (H) 12/13/2021   LDL:  Lab Results  Component Value Date   LDLCALC 101 (H) 12/07/2018   Urine Drug Screen:     Component Value Date/Time   LABOPIA NONE DETECTED 07/29/2021 1514   COCAINSCRNUR NONE DETECTED 07/29/2021 1514   LABBENZ NONE DETECTED 07/29/2021 1514   AMPHETMU NONE DETECTED 07/29/2021 1514   THCU NONE DETECTED 07/29/2021 1514   LABBARB NONE DETECTED 07/29/2021 1514    Alcohol Level  Component Value Date/Time   [facility] <10 07/29/2021 1341    Imaging and Diagnostic studies  Results for orders placed during the hospital encounter of 04/23/2022  MR BRAIN WO CONTRAST  Narrative CLINICAL DATA:  Hydrocephalus.  NPH with confusion.  EXAM: MRI HEAD WITHOUT CONTRAST  TECHNIQUE: Multiplanar, multiecho pulse sequences of the brain and surrounding structures were obtained without intravenous contrast.  COMPARISON:  Head CT April 27, 2022. MRI of the brain August 01, 2021.  FINDINGS: The study is partially degraded by motion and susceptibility artifact from right frontal shunt valve.  Brain: Postsurgical changes from right frontal approach ventriculostomy catheter placement with the head at the body of the left lateral  ventricle. Right frontal pneumocephalus appears slightly decreased when compared to recent CT. Bilateral subdural hygromas measuring up to 6 mm on the right an 8 mm on the left appear unchanged compared to recent CT. Ventricular size remains stable. No acute infarct, acute hemorrhage or midline shift. Patchy hypodensity of the periventricular white matter unchanged from prior MRI.  Vascular: Normal flow voids.  Skull and upper cervical spine: No focal marrow lesion identified.  Sinuses/Orbits: Negative.  Other: None.  IMPRESSION: 1. Postsurgical changes from right frontal approach ventriculostomy catheter placement. Ventricular size remains stable when compared to recent CT. 2. Bilateral subdural hygromas measuring up to 6 mm on the right an 8 mm on the left also appear unchanged.   Electronically Signed By: Pedro Earls M.D. On: 04/27/2022 13:19  OEV:[id number] generalized predominantly 5 to 7 Hz theta slowing admixed with intermittent 2-'3Hz'$  delta slowing. Hyperventilation and photic stimulation were not performed.     Impression   Randy [name] is a 81 y.o. 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  His neurologic examination is notable for generalized encephalopathy and without focal findings suggestive of novel structural lesion as well as visual hallucinations.   Randy [name] is likely suffering from multifocal encephalopathy, with contributions from ? Of overshunting (leave this determination up to primary neurosurgical team), ?element of AKI (Cr 1.49 now, however usually in 1.29-1.4 ranges, so less likely), and hospital-acquired mixed hyper/hypoactive delirium fueled by altered sleep/wake cycle all in the setting of poor substrate due to  subacute NPH dementia.  Recommendations   -start seroquel '25mg'$  Q 8pm -continue melatonin 3 mg qhs as not to confound efficacy if has future clinical changes , but can consider stopping tomorrow if sleeps through the night  -maintain strict and aggressive delirium precautions and I educated wife on these today: lights on during day, continued verbal stimulation, up in chair or in chair position (as able per neurosurgical team from their standpoint), continued re-orientation ______________________________________________________________________ Patient case discussed with and seen by Dr. Leonel Ramsay.   Randy Hummer, PA-C Neurology   I have seen the patient reviewed the above note.  From discussion with his wife, he has had hallucinations, seeing things on the walls, and from review of the chart it appears that he has been having trouble sleeping at night.  I suspect that this represents a multifactorial delirium.  It is possible that the hygromas may be playing some role, and I discussed this with Dr. Venetia Constable who indicates that he has recently turned down the shunt and they should resolve over time.  My suspicion is  that he will gradually improve from a neurological status.   At this point, I think that treatment of his delirium will be mainly supportive, I will start Seroquel to try and maintain his day night cycle.  I instructed his wife to try and keep him awake during the day.  I would expect him to gradually improve over the next few weeks.  If he continues to worsen, or does not gradually improve as expected, then we would be happy to reevaluate him to revisit this, please call with further questions or concerns.  Roland Rack, MD [facility] [phone]  If 7pm- 7am, please page neurology on call as listed in [place].

## 2022-04-30 ENCOUNTER — Inpatient Hospital Stay (HOSPITAL_COMMUNITY): Payer: Medicare PPO | Admitting: Anesthesiology

## 2022-04-30 DIAGNOSIS — I129 Hypertensive chronic kidney disease with stage 1 through stage 4 chronic kidney disease, or unspecified chronic kidney disease: Secondary | ICD-10-CM

## 2022-04-30 DIAGNOSIS — N1831 Chronic kidney disease, stage 3a: Secondary | ICD-10-CM

## 2022-04-30 DIAGNOSIS — G912 (Idiopathic) normal pressure hydrocephalus: Secondary | ICD-10-CM | POA: Diagnosis not present

## 2022-04-30 DIAGNOSIS — Z515 Encounter for palliative care: Secondary | ICD-10-CM

## 2022-04-30 DIAGNOSIS — G9340 Encephalopathy, unspecified: Secondary | ICD-10-CM | POA: Diagnosis not present

## 2022-04-30 DIAGNOSIS — E1122 Type 2 diabetes mellitus with diabetic chronic kidney disease: Secondary | ICD-10-CM

## 2022-04-30 DIAGNOSIS — J9602 Acute respiratory failure with hypercapnia: Secondary | ICD-10-CM

## 2022-04-30 DIAGNOSIS — J9601 Acute respiratory failure with hypoxia: Secondary | ICD-10-CM

## 2022-04-30 DIAGNOSIS — N179 Acute kidney failure, unspecified: Secondary | ICD-10-CM

## 2022-04-30 LAB — CBC
HCT: 41.1 % (ref 39.0–52.0)
Hemoglobin: 12.5 g/dL — ABNORMAL LOW (ref 13.0–17.0)
MCH: 32.9 pg (ref 26.0–34.0)
MCHC: 30.4 g/dL (ref 30.0–36.0)
MCV: 108.2 fL — ABNORMAL HIGH (ref 80.0–100.0)
Platelets: 300 10*3/uL (ref 150–400)
RBC: 3.8 MIL/uL — ABNORMAL LOW (ref 4.22–5.81)
RDW: 12.4 % (ref 11.5–15.5)
WBC: 17 10*3/uL — ABNORMAL HIGH (ref 4.0–10.5)
nRBC: 0 % (ref 0.0–0.2)

## 2022-04-30 LAB — PTH, INTACT AND CALCIUM
Calcium, Total (PTH): 10 mg/dL (ref 8.6–10.2)
PTH: 49 pg/mL (ref 15–65)

## 2022-04-30 LAB — CORTISOL-AM, BLOOD: Cortisol - AM: 34.8 ug/dL — ABNORMAL HIGH (ref 6.7–22.6)

## 2022-04-30 LAB — GLUCOSE, CAPILLARY: Glucose-Capillary: 166 mg/dL — ABNORMAL HIGH (ref 70–99)

## 2022-04-30 LAB — MAGNESIUM: Magnesium: 2.1 mg/dL (ref 1.7–2.4)

## 2022-04-30 MED ORDER — ACETAMINOPHEN 650 MG RE SUPP: 650.0000 mg | Freq: Four times a day (QID) | RECTAL | Status: DC | PRN

## 2022-04-30 MED ORDER — SODIUM CHLORIDE 0.9 % IV SOLN: INTRAVENOUS | Status: DC

## 2022-04-30 MED ORDER — GLYCOPYRROLATE 0.2 MG/ML IJ SOLN: 0.2000 mg | INTRAMUSCULAR | Status: DC | PRN

## 2022-04-30 MED ORDER — ACETAMINOPHEN 325 MG PO TABS: 650.0000 mg | ORAL_TABLET | Freq: Four times a day (QID) | ORAL | Status: DC | PRN

## 2022-04-30 MED ORDER — FENTANYL BOLUS VIA INFUSION: 100.0000 ug | INTRAVENOUS | Status: DC | PRN

## 2022-04-30 MED ORDER — DOCUSATE SODIUM 50 MG/5ML PO LIQD: 100.0000 mg | Freq: Two times a day (BID) | ORAL | Status: DC

## 2022-04-30 MED ORDER — NOREPINEPHRINE 4 MG/250ML-% IV SOLN
0.0000 ug/min | INTRAVENOUS | Status: DC
  Filled 2022-04-30 (×2): qty 250

## 2022-04-30 MED ORDER — HYDRALAZINE HCL 20 MG/ML IJ SOLN
10.0000 mg | INTRAMUSCULAR | Status: DC | PRN
  Filled 2022-04-30: qty 1

## 2022-04-30 MED ORDER — PHENYLEPHRINE HCL-NACL 20-0.9 MG/250ML-% IV SOLN
0.0000 ug/min | INTRAVENOUS | Status: DC
  Filled 2022-04-30: qty 250

## 2022-04-30 MED ORDER — POLYETHYLENE GLYCOL 3350 17 G PO PACK: 17.0000 g | PACK | Freq: Every day | ORAL | Status: DC

## 2022-04-30 MED ORDER — FENTANYL 2500MCG IN NS 250ML (10MCG/ML) PREMIX INFUSION
0.0000 ug/h | INTRAVENOUS | Status: DC
  Administered 2022-04-30: 300 ug/h via INTRAVENOUS

## 2022-04-30 MED ORDER — MIDAZOLAM-SODIUM CHLORIDE 100-0.9 MG/100ML-% IV SOLN
0.0000 mg/h | INTRAVENOUS | Status: DC
  Administered 2022-04-30: 20 mg/h via INTRAVENOUS

## 2022-04-30 MED ORDER — FENTANYL CITRATE PF 50 MCG/ML IJ SOSY: 25.0000 ug | PREFILLED_SYRINGE | Freq: Once | INTRAMUSCULAR | Status: DC

## 2022-04-30 MED ORDER — FENTANYL 2500MCG IN NS 250ML (10MCG/ML) PREMIX INFUSION
25.0000 ug/h | INTRAVENOUS | Status: DC
  Filled 2022-04-30: qty 250

## 2022-04-30 MED ORDER — MIDAZOLAM-SODIUM CHLORIDE 100-0.9 MG/100ML-% IV SOLN
0.5000 mg/h | INTRAVENOUS | Status: DC
  Filled 2022-04-30: qty 100

## 2022-04-30 MED ORDER — MIDAZOLAM HCL 2 MG/2ML IJ SOLN: 1.0000 mg | INTRAMUSCULAR | Status: DC | PRN

## 2022-04-30 MED ORDER — POLYVINYL ALCOHOL 1.4 % OP SOLN: 1.0000 [drp] | Freq: Four times a day (QID) | OPHTHALMIC | Status: DC | PRN

## 2022-04-30 MED ORDER — GLYCOPYRROLATE 1 MG PO TABS: 1.0000 mg | ORAL_TABLET | ORAL | Status: DC | PRN

## 2022-04-30 MED ORDER — FENTANYL BOLUS VIA INFUSION: 25.0000 ug | INTRAVENOUS | Status: DC | PRN

## 2022-04-30 MED ORDER — MIDAZOLAM BOLUS VIA INFUSION (WITHDRAWAL LIFE SUSTAINING TX): 2.0000 mg | INTRAVENOUS | Status: DC | PRN

## 2022-05-01 LAB — BASIC METABOLIC PANEL
Anion gap: 9 (ref 5–15)
BUN: 34 mg/dL — ABNORMAL HIGH (ref 8–23)
CO2: 33 mmol/L — ABNORMAL HIGH (ref 22–32)
Calcium: 10.7 mg/dL — ABNORMAL HIGH (ref 8.9–10.3)
Chloride: 106 mmol/L (ref 98–111)
Creatinine, Ser: 1.75 mg/dL — ABNORMAL HIGH (ref 0.61–1.24)
GFR, Estimated: 39 mL/min — ABNORMAL LOW (ref >60)
Glucose, Bld: 189 mg/dL — ABNORMAL HIGH (ref 70–99)
Potassium: 6.5 mmol/L (ref 3.5–5.1)
Sodium: 148 mmol/L — ABNORMAL HIGH (ref 135–145)

## 2022-05-02 LAB — VITAMIN B1: Vitamin B1 (Thiamine): 183.4 nmol/L (ref 66.5–200.0)

## 2022-05-03 LAB — PROTEIN ELECTROPHORESIS, SERUM
A/G Ratio: 1.3 (ref 0.7–1.7)
Albumin ELP: 3.2 g/dL (ref 2.9–4.4)
Alpha-1-Globulin: 0.2 g/dL (ref 0.0–0.4)
Alpha-2-Globulin: 1 g/dL (ref 0.4–1.0)
Beta Globulin: 0.8 g/dL (ref 0.7–1.3)
Gamma Globulin: 0.4 g/dL (ref 0.4–1.8)
Globulin, Total: 2.4 g/dL (ref 2.2–3.9)
Total Protein ELP: 5.6 g/dL — ABNORMAL LOW (ref 6.0–8.5)

## 2022-05-04 LAB — ANAEROBIC CULTURE W GRAM STAIN: Gram Stain: NONE SEEN

## 2022-05-04 LAB — PTH-RELATED PEPTIDE: PTH-related peptide: <2 pmol/L

## 2022-05-04 NOTE — Anesthesia Procedure Notes (Addendum)
Procedure Name: Intubation Date/Time: 05/18/22 9:44 AM  Performed by: Kyung Rudd, CRNAPre-anesthesia Checklist: Patient identified, Emergency Drugs available, Suction available and Patient being monitored Patient Re-evaluated:Patient Re-evaluated prior to induction Oxygen Delivery Method: Ambu bag Preoxygenation: Pre-oxygenation with 100% oxygen Ventilation: Mask ventilation without difficulty Laryngoscope Size: Glidescope and 4 Grade View: Grade I Tube type: Oral Tube size: 8.0 mm Number of attempts: 1 Airway Equipment and Method: Stylet and Video-laryngoscopy Placement Confirmation: ETT inserted through vocal cords under direct vision, positive ETCO2 and breath sounds checked- equal and bilateral Secured at: 25 cm Tube secured with: Tape Dental Injury: Teeth and Oropharynx as per pre-operative assessment

## 2022-05-04 NOTE — Accreditation Note (Signed)
Restraint death within 24 hours of soft waist belt discontinuation. Restraint death not reported to CMS. Logged on 05/03/2022 at 0900 by Eddie Dibbles RN.

## 2022-05-04 NOTE — Progress Notes (Signed)
   05-23-2022 0910  Vitals  BP (!) 97/28  Pulse Rate 62  ECG Heart Rate 65  Resp (!) 30  MEWS COLOR  MEWS Score Color Yellow  Oxygen Therapy  SpO2 92 %  Height and Weight  Height '5\' 10"'$  (1.778 m)  Weight in (lb) to have BMI = 25 173.9  MEWS Score  MEWS Temp 0  MEWS Systolic 1  MEWS Pulse 0  MEWS RR 2  MEWS LOC 0  MEWS Score 3   RN assumed pt care after 0800 this am; pt BP was elevated to be in the 200's on the monitor and RN asked to the outgoing RN to notify NP Glenford Peers who was on the unit of the pt's BP. RN went to assess pt and to medicate him for his BP. Pt BP was 114/46, RN rechecked and his BP was 97/28. Pt was assessed to be Afib on the monitor but his HR started dropped and he became bradycardic in the 50 to 40's. RN sternal rub with no response and oral care had no gag reflux. Charge RN was called to the room, Rapid was called and neurosurgery was paged. NP Glenford Peers on 4N ICU was notified by charge RN. Rapid response, charge Agricultural consultant present at bedside. P[employer] RN

## 2022-05-04 NOTE — Discharge Summary (Signed)
Discharge Summary  Date of Admission: 04/20/2022  Date of Discharge: 2022/05/25  Attending Physician: Emelda Brothers, MD  Hospital Course: Patient was admitted after a shunt revision due to progressive decline in neurologic function. He underwent a conversion of an LP shunt to VP shunt on 10/24. Post-operatively, he did not have a significant improvement in function, post-op imaging showed shunt in good position, MRI showed some hygromas related to shunt drainage without concerning findings. Neurology was consulted for further assistance given his progressive encephalopathy. Workup was unremarkable, the running diagnosis was multifactorial encephalopathy. On the morning of May 25, 2022, he became unresponsive with hypoxemia and hypotension. He was intubated and his wife clarified that he was DNR/DNI, so he was extubated and died on 05-25-2022.   Discharge diagnosis: Diffuse encephlopathy  Judith Part, MD 05/03/22 11:18 AM

## 2022-05-04 NOTE — Progress Notes (Signed)
RT note- Patient was extubated per family wishes with with MD at the bedside. Family as well is at the bedside.

## 2022-05-04 NOTE — Progress Notes (Signed)
   Palliative Medicine Inpatient Follow Up Note   The Palliative Care team has acknowledged the consultation for [facility].  This provider went to bedside this late morning though was intercepted by chaplaincy and told patient had already passed away.  I went to bedside and offered family support.   Questions/concerns answered.  No Charge ______________________________________________________________________________________ [place] Team Team Cell Phone: [phone] Please utilize secure chat with additional questions, if there is no response within 30 minutes please call the above phone number  Palliative Medicine Team providers are available by phone from 7am to 7pm daily and can be reached through the team cell phone.  Should this patient require assistance outside of these hours, please call the patient's attending physician.

## 2022-05-04 NOTE — Code Documentation (Signed)
I was called dt patients change in BP. At 0800 the patient's BP was 204/68 with a RR at 34. The RN was going to give hypertensive medications but noticed that his BP had dropped to 114/46 and his heartrate had dropped to 65. She held medication administration. I was called at [phone] for support. His heartrate was in the high 30's on my arrival and the patient was agonally breathing. He was unresponsive to all stimuli and his pupils were nonreactive. 1 amp of atropine was given for his heartrate. Code blue was called on this patient because he was not protecting his airway. CRNAs intubated this patient without difficulty. After intubation, the patient had a witnessed seizure. He was given 1g Keppra. He was started on Neo for BP control. When MD called wife, she stated that he was a DNR/DNI. Family arrived shortly and expressed their desire to transition him to make him comfortable. He was placed on fentanyl gtt and midazolam. After sedation was initiated, he was extubated and the vasopressors were stopped. Patient pronounced at 1058 by myself and Casey Burkitt. Family at bedside with chaplain.

## 2022-05-04 NOTE — Progress Notes (Signed)
Neurosurgery  This morning, patient was unresponsive, not protecting airway with shallow breaths.  He was also developing cardiac instability and appeared to be rapidly progressing to cardiopulmonary arrest.  As such, patient was intubated emergently.  After airway secure, I called wife to inform her of the events and she informed me he had a DNR/DNI which the patient had been adamant about adhering to.  She is in possession of his DNR/DNI.  Unfortunately, he was listed as full code in his medical chart.  As this is the case, we will contact palliative care and likely plan for extubation after she is able to visit with him unless patient exhibits dramatic improvement.  Based on the timecourse and imaging, his progressive encephalopathy and neurologic decline is not from altered CSF dynamics and presumably from some underlying neurodegenerative disorder that responded only mildy and incompletely to CSF diversion.

## 2022-05-04 NOTE — Progress Notes (Signed)
   05-12-2022 0945  Clinical Encounter Type  Visited With Family;Patient and family together;Health care provider  Visit Type Initial;Code;Death  Referral From Nurse  Spiritual Encounters  Spiritual Needs Emotional;Grief support   CH responded to CODE BLUE page on 4N at 9:45am; when [facility] arrived, medical team were attending to pt. who was at this time intubated; after some time [facility] made contact w/pt.'s wife Margie Billet per staff's request.  In checking chart for wife's contact info, [place] noted that pt. had AD on file in [place] stating he did not want aggressive medical treatment in the three scenarios discussed in Living Will, however pt. did not have DNR filed in [place] and his code status was listed as Full Code.  Wife had already been contacted re: code by another provider and said she was en route to the hospital; wife also shared that pt. is a DNR and that she is bringing documentation to this effect.  [place] shared this with medical team. When pt.'s wife and daughter Jocelyn Lamer arrived, wife immediately requested that medical team prepare to remove breathing tube and make pt. comfortable.  Daughter initially expressed feelings of frustration, voicing confusion re: what would have caused such a rapid decline in pt.'s condition this past week.  Wife and daughter reflected on pt.'s reticence to undergo surgery this past Tuesday and both agreed that pt. would not now want to continue with life-prolonging treatment given his condition.  [place] remained present with family as medical team transitioned pt. to comfort care.  Both wife and daughter spent time at pt.'s bedside saying goodbye.  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  They said he had grown up as a [place] but lately had been asking questions seeking scientific "proof" for matters of faith; they both feel that pt. was at peace spiritually despite these questions.   [place] provided wife and daughter with  patient placement card and relayed funeral home selection to RN when family made their decision.  CH discussed pt.'s case today with palliative care provider when she arrived to assess family needs.    Lindaann Pascal, Chaplain Pager: [phone]

## 2022-05-04 NOTE — IPAL (Signed)
  Interdisciplinary Goals of Care Family Meeting   Date carried out: 05/11/22  Location of the meeting: Bedside  Member's involved: Physician, Bedside Registered Nurse, Respiratory Therapist, Chaplain, and Family Member or next of kin  Durable Power of Attorney or acting medical decision maker: Nicholaus Corolla    Discussion: We discussed goals of care for [employer] .  Patient condition was updated to patient's wife and daughter at bedside.  She was informed that patient was found unresponsive and requiring emergent endotracheal intubation.  Patient will go for CT scan to find out the cause of being unresponsiveness and ABGs.  Patient's wife and daughter stated that patient would not wanted to be on ventilator at all, as he has been in and out of the hospital they would not want aggressive medical treatment for him per his living wishes.  At this time they requested to proceed with comfort care and palliative extubation. Neurosurgery (primary team) was called and updated, they are in agreement with comfort care and palliative extubation. Comfort care orders were written, patient was started on fentanyl and Versed infusion and palliative extubation was performed.  Patient's family was at bedside  Code status: Full DNR  Disposition: In-patient comfort care  Time spent for the meeting: [place], MD  05-11-2022, 10:45 AM

## 2022-05-04 NOTE — Progress Notes (Signed)
A code blue was called. By the time of imts arrival, patient had a pulse but was hemodynamically unstable. His blood pressures were in the 74U systolic. He was intubated without difficulty. And started on neosynephrine given his tachycardia. Levophed was eventually added in addition to fluids. He did have a seizure during this episode. The seizure lasted for ~1 min. Unfortunately he remained unresponsive.   CCM was consulted as well. It was discovered after the initiation of pressors and intubation that patient is DNR/DNI. CCM team eventually resumed care of the patient and spoke directly with patients family.

## 2022-05-04 NOTE — Consult Note (Signed)
NAME:  Randy [name], MRN:  [id number], DOB:  02-20-1941, LOS: 4 ADMISSION DATE:  04/17/2022, CONSULTATION DATE: 05-24-22 REFERRING MD: Duffy Rhody, CHIEF COMPLAINT: Acute respiratory failure  History of Present Illness:  81 year old male with normal pressure hydrocephalus, diabetes type 2, hypertension and obstructive sleep apnea who initially presented with progressive cognitive decline, noted to have VP shunt malfunctioning, underwent revision.  Course was complicated with progressive encephalopathy, today patient was noted to be completely unresponsive, rapid response was called patient was intubated.  PCCM was consulted for help evaluation and medical management  Pertinent  Medical History   Past Medical History:  Diagnosis Date   COVID 2020   mild case   Depression    2022   Diabetes mellitus    Type II   Hypertension    NPH (normal pressure hydrocephalus) (HCC)    OSA (obstructive sleep apnea) 11/24/2016   wears cpap     Significant Hospital Events: Including procedures, antibiotic start and stop dates in addition to other pertinent events     Interim History / Subjective:    Objective   Blood pressure (!) 48/34, pulse (!) 136, temperature 97.6 F (36.4 C), temperature source Axillary, resp. rate 16, height _0  (1.778 m), weight 77.6 kg, SpO2 92 %.    Vent Mode: PRVC FiO2 (%):  [100 %] 100 % Set Rate:  [16 bmp] 16 bmp Vt Set:  [580 mL] 580 mL PEEP:  [5 cmH20] 5 cmH20   Intake/Output Summary (Last 24 hours) at 2022/05/24 1044 Last data filed at 04/29/2022 2002 Gross per 24 hour  Intake --  Output 350 ml  Net -350 ml   Filed Weights   05/02/2022 1005  Weight: 77.6 kg    Examination: Physical exam: General: Crtitically ill-appearing male, orally intubated HEENT: [place]/AT, eyes anicteric.  ETT and OGT in place Neuro: Sedated, not following commands.  Eyes are closed.  Pupils pinpoint mm bilateral minimally reactive to light.  He was noted to be  extensor posturing Chest: Coarse breath sounds, no wheezes or rhonchi Heart: Tachycardic, no murmurs or gallops Abdomen: Soft, nontender, nondistended, bowel sounds present Skin: No rash    Resolved Hospital Problem list     Assessment & Plan:  Acute hypoxic/hypercapnic respiratory failure Normal pressure hydrocephalus status post VP shunt, likely malfunctioning Cerebral edema with brain compression Acute encephalopathy due to cerebral edema Hypernatremia/hyperkalemia AKI on CKD stage III a Diabetes type 2  Continue lung protective ventilation PAD protocol with propofol and fentanyl VAP bundle in place Neurosurgery is following Continue neuro watch We will get a stat head CT Closely monitor electrolyte We will give 1 amp of bicarbonate, calcium gluconate and Lasix Continue Lokelma Monitor fingerstick with goal 140-180 Admit to ICU  Best Practice (right click and "Reselect all SmartList Selections" daily)   Per primary team  Labs   CBC: Recent Labs  Lab 05/02/2022 1049 04/22/2022 1758 04/29/22 0959 2022-05-24 1001  WBC 7.1 10.3 9.4 17.0*  NEUTROABS  --   --  6.5  --   HGB 15.4 15.1 13.2 12.5*  HCT 46.5 46.9 41.6 41.1  MCV 100.9* 101.1* 102.0* 108.2*  PLT 178 169 196 673    Basic Metabolic Panel: Recent Labs  Lab 04/03/2022 1049 04/03/2022 2006 04/29/22 0519  NA 141 142 142  K 4.6 4.2 3.9  CL 104 102 105  CO2 _1 GLUCOSE 150* 210* 118*  BUN 21 23 27*  CREATININE 1.38* 1.44* 1.49*  CALCIUM 11.1* 11.0*  10.7*  PHOS  --   --  3.2   GFR: Estimated Creatinine Clearance: 40.1 mL/min (A) (by C-G formula based on SCr of 1.49 mg/dL (H)). Recent Labs  Lab 04/09/2022 1049 04/29/2022 1758 04/29/22 0959 05-08-22 1001  WBC 7.1 10.3 9.4 17.0*    Liver Function Tests: Recent Labs  Lab 04/28/2022 2006 04/29/22 0519  AST 20  --   ALT 25  --   ALKPHOS 70  --   BILITOT 0.6  --   PROT 6.4*  --   ALBUMIN 3.8 3.2*   No results for input(s): "LIPASE", "AMYLASE"  in the last 168 hours. Recent Labs  Lab 04/28/22 0046  AMMONIA 20    ABG    Component Value Date/Time   TCO2 34 (H) 07/29/2021 1346     Coagulation Profile: No results for input(s): "INR", "PROTIME" in the last 168 hours.  Cardiac Enzymes: No results for input(s): "CKTOTAL", "CKMB", "CKMBINDEX", "TROPONINI" in the last 168 hours.  HbA1C: Hgb A1c MFr Bld  Date/Time Value Ref Range Status  12/13/2021 11:08 AM 5.8 (H) 4.8 - 5.6 % Final    Comment:    (NOTE) Pre diabetes:          5.7%-6.4%  Diabetes:              >6.4%  Glycemic control for   <7.0% adults with diabetes   07/29/2021 07:07 PM 6.0 (H) 4.8 - 5.6 % Final    Comment:    (NOTE) Pre diabetes:          5.7%-6.4%  Diabetes:              >6.4%  Glycemic control for   <7.0% adults with diabetes     CBG: Recent Labs  Lab 04/29/22 0759 04/29/22 1148 04/29/22 1518 04/29/22 2101 05-08-22 0807  GLUCAP 121* 123* 132* 137* 166*    Review of Systems:   Unable to obtain as patient is intubated and sedated  Past Medical History:  He,  has a past medical history of COVID (2020), Depression, Diabetes mellitus, Hypertension, NPH (normal pressure hydrocephalus) ([place]), and OSA (obstructive sleep apnea) (11/24/2016).   Surgical History:   Past Surgical History:  Procedure Laterality Date   CATARACT EXTRACTION, BILATERAL Bilateral 2019   COLONOSCOPY  07/06/2011   Procedure: COLONOSCOPY;  Surgeon: Winfield Cunas., MD;  Location: [facility] ENDOSCOPY;  Service: Endoscopy;  Laterality: N/A;   ESOPHAGOGASTRODUODENOSCOPY  07/05/2011   Procedure: ESOPHAGOGASTRODUODENOSCOPY (EGD);  Surgeon: Landry Dyke, MD;  Location: [facility] ENDOSCOPY;  Service: Endoscopy;  Laterality: N/A;   EYE SURGERY     FRACTURE SURGERY     right wrist   PLACEMENT OF LUMBAR DRAIN N/A 08/17/2021   Procedure: PLACEMENT OF LUMBAR DRAIN;  Surgeon: Judith Part, MD;  Location: [place];  Service: Neurosurgery;  Laterality: N/A;  3C/RM 19   SHUNT  REMOVAL Right 04/14/2022   Procedure: Removal of Right Lumbar Peritoneal Shunt;  Surgeon: Judith Part, MD;  Location: [place];  Service: Neurosurgery;  Laterality: Right;   SHUNT REVISION Right 02/13/2022   Procedure: RIGHT LUMBOPERITONEAL SHUNT REVISION;  Surgeon: Judith Part, MD;  Location: [place];  Service: Neurosurgery;  Laterality: Right;   VENTRICULOPERITONEAL SHUNT Right 12/16/2021   Procedure: Lumboperitoneal shunt placement;  Surgeon: Judith Part, MD;  Location: [place];  Service: Neurosurgery;  Laterality: Right;   VENTRICULOPERITONEAL SHUNT Right 04/16/2022   Procedure: Ventricular Peritoneal Shut placement;  Surgeon: Judith Part, MD;  Location:  [place] [facility];  Service: Neurosurgery;  Laterality: Right;     Social History:   reports that he quit smoking about 20 years ago. His smoking use included cigarettes. He has a 30.00 pack-year smoking history. He quit smokeless tobacco use about 30 years ago.  His smokeless tobacco use included chew. He reports that he does not currently use alcohol after a past usage of about 18.0 standard drinks of alcohol per week. He reports that he does not use drugs.   Family History:  His family history includes Brain cancer in his mother; Diabetes in his father; Hypertension in his father; Stroke in his father.   Allergies Allergies  Allergen Reactions   Lipitor [Atorvastatin] Other (See Comments)    Muscle pain     Home Medications  Prior to Admission medications   Medication Sig Start Date End Date Taking? Authorizing Provider  carvedilol (COREG) 6.25 MG tablet Take 1 tablet (6.25 mg total) by mouth 2 (two) times daily with a meal. Patient taking differently: Take 6.25 mg by mouth daily. 08/27/21 12/08/23 Yes Nafziger, Tommi Rumps, NP  Cholecalciferol (VITAMIN D-3 PO) Take 1 capsule by mouth in the morning.   Yes [provider]  folic acid (FOLVITE) 1 MG tablet TAKE 1 TABLET BY MOUTH DAILY 02/21/22  Yes Nafziger, Tommi Rumps, NP   ibuprofen (ADVIL) 200 MG tablet Take 200 mg by mouth every 8 (eight) hours as needed (for pain.).   Yes [provider]  lisinopril-hydrochlorothiazide (ZESTORETIC) 20-12.5 MG tablet TAKE ONE TABLET BY MOUTH DAILY Patient taking differently: Take 1 tablet by mouth daily. 09/28/21  Yes Nafziger, Tommi Rumps, NP  metFORMIN (GLUCOPHAGE) 1000 MG tablet TAKE 1 TABLET BY MOUTH DAILY (1000 MG TOTAL) WITH BREAKFAST Patient taking differently: Take 1,000 mg by mouth daily with breakfast. 02/11/22  Yes Nafziger, Tommi Rumps, NP  Multiple Vitamin (MULTIVITAMIN WITH MINERALS) TABS tablet Take 1 tablet by mouth daily. 08/03/21  Yes Mercy Riding, MD  thiamine 100 MG tablet Take 1 tablet (100 mg total) by mouth daily. 08/03/21  Yes Mercy Riding, MD  vitamin B-12 (CYANOCOBALAMIN) 1000 MCG tablet Take 1,000 mcg by mouth every Monday, Tuesday, Wednesday, Thursday, and Friday.   Yes [provider]  Blood Glucose Calibration (TRUE METRIX LEVEL 1) Low SOLN USE TO CHECK METER PRIOR TO TESTING SUGAR 11/09/17   Marletta Lor, MD  Blood Glucose Monitoring Suppl (TRUE METRIX AIR GLUCOSE METER) w/Device KIT 1 each by Does not apply route daily. 11/09/17   Marletta Lor, MD  citalopram (CELEXA) 20 MG tablet Take 1 tablet (20 mg total) by mouth in the morning. Patient not taking: Reported on 04/25/2022 02/11/22   Dorothyann Peng, NP  clobetasol (OLUX) 0.05 % topical foam Apply topically 2 (two) times daily. Patient taking differently: Apply 1 Application topically 2 (two) times daily as needed (rash). 09/27/21   Lavonna Monarch, MD  modafinil (PROVIGIL) 100 MG tablet Take 1 tablet (100 mg total) by mouth daily. Patient not taking: Reported on 04/25/2022 03/23/22   Freida Busman, MD  TRUE METRIX BLOOD GLUCOSE TEST test strip USE TO TEST BLOOD GLUCOSE TWICE DAILY. 06/06/19   Nafziger, Tommi Rumps, NP  TRUEPLUS LANCETS 33G MISC USE TO CHECK BLOOD SUGAR DAILY AND PRN 11/09/17   Marletta Lor, MD     Critical care time:       Total critical care time: 37 minutes  Performed by: [place] care time was exclusive of separately billable procedures and treating other patients.  Critical care was necessary to treat or prevent imminent or life-threatening deterioration.   Critical care was time spent personally by me on the following activities: development of treatment plan with patient and/or surrogate as well as nursing, discussions with consultants, evaluation of patient's response to treatment, examination of patient, obtaining history from patient or surrogate, ordering and performing treatments and interventions, ordering and review of laboratory studies, ordering and review of radiographic studies, pulse oximetry and re-evaluation of patient's condition.   Jacky Kindle, MD [facility] See Amion for pager If no response to pager, please call [phone] until 7pm After 7pm, Please call E-link [phone]

## 2022-05-04 DEATH — deceased

## 2022-05-12 MED FILL — Medication: Qty: 1 | Status: AC

## 2022-06-15 ENCOUNTER — Ambulatory Visit: Payer: Medicare PPO | Admitting: Podiatry

## 2022-08-25 ENCOUNTER — Ambulatory Visit: Payer: Medicare PPO | Admitting: Psychiatry

## 2022-11-01 ENCOUNTER — Ambulatory Visit: Payer: Medicare PPO | Admitting: Psychiatry
